# Patient Record
Sex: Female | Born: 1935 | Race: Black or African American | Hispanic: No | Smoking: Former smoker
Health system: Southern US, Community
[De-identification: ages and names within clinical notes are randomized; demographics above are authoritative.]

## PROBLEM LIST (undated history)

## (undated) DIAGNOSIS — J449 Chronic obstructive pulmonary disease, unspecified: Secondary | ICD-10-CM

## (undated) DIAGNOSIS — N184 Chronic kidney disease, stage 4 (severe): Secondary | ICD-10-CM

## (undated) DIAGNOSIS — J189 Pneumonia, unspecified organism: Secondary | ICD-10-CM

## (undated) DIAGNOSIS — I5032 Chronic diastolic (congestive) heart failure: Secondary | ICD-10-CM

## (undated) DIAGNOSIS — E0590 Thyrotoxicosis, unspecified without thyrotoxic crisis or storm: Secondary | ICD-10-CM

## (undated) DIAGNOSIS — E079 Disorder of thyroid, unspecified: Secondary | ICD-10-CM

## (undated) DIAGNOSIS — M109 Gout, unspecified: Secondary | ICD-10-CM

## (undated) DIAGNOSIS — M1990 Unspecified osteoarthritis, unspecified site: Secondary | ICD-10-CM

## (undated) DIAGNOSIS — Z9981 Dependence on supplemental oxygen: Secondary | ICD-10-CM

## (undated) DIAGNOSIS — I10 Essential (primary) hypertension: Secondary | ICD-10-CM

## (undated) DIAGNOSIS — I82409 Acute embolism and thrombosis of unspecified deep veins of unspecified lower extremity: Secondary | ICD-10-CM

## (undated) DIAGNOSIS — K219 Gastro-esophageal reflux disease without esophagitis: Secondary | ICD-10-CM

## (undated) HISTORY — DX: Chronic kidney disease, stage 4 (severe): N18.4

## (undated) HISTORY — PX: JOINT REPLACEMENT: SHX530

## (undated) HISTORY — DX: Chronic diastolic (congestive) heart failure: I50.32

## (undated) HISTORY — PX: CATARACT EXTRACTION W/ INTRAOCULAR LENS  IMPLANT, BILATERAL: SHX1307

## (undated) HISTORY — PX: VAGINAL HYSTERECTOMY: SUR661

---

## 1997-09-24 ENCOUNTER — Other Ambulatory Visit: Admission: RE | Admit: 1997-09-24 | Discharge: 1997-09-24 | Payer: Self-pay | Admitting: Orthopedic Surgery

## 2001-08-10 ENCOUNTER — Emergency Department (HOSPITAL_COMMUNITY): Admission: EM | Admit: 2001-08-10 | Discharge: 2001-08-10 | Payer: Self-pay | Admitting: Emergency Medicine

## 2004-06-11 HISTORY — PX: TOTAL KNEE ARTHROPLASTY: SHX125

## 2004-07-06 ENCOUNTER — Ambulatory Visit (HOSPITAL_COMMUNITY): Admission: RE | Admit: 2004-07-06 | Discharge: 2004-07-09 | Payer: Self-pay | Admitting: Orthopedic Surgery

## 2004-11-30 ENCOUNTER — Inpatient Hospital Stay (HOSPITAL_COMMUNITY): Admission: AD | Admit: 2004-11-30 | Discharge: 2004-12-08 | Payer: Self-pay | Admitting: Internal Medicine

## 2004-11-30 ENCOUNTER — Ambulatory Visit (HOSPITAL_COMMUNITY): Admission: RE | Admit: 2004-11-30 | Discharge: 2004-11-30 | Payer: Self-pay | Admitting: Internal Medicine

## 2004-12-02 ENCOUNTER — Ambulatory Visit: Payer: Self-pay | Admitting: Cardiology

## 2004-12-02 ENCOUNTER — Encounter: Payer: Self-pay | Admitting: Cardiology

## 2006-10-18 ENCOUNTER — Ambulatory Visit (HOSPITAL_COMMUNITY): Admission: RE | Admit: 2006-10-18 | Discharge: 2006-10-18 | Payer: Self-pay | Admitting: Internal Medicine

## 2007-07-26 ENCOUNTER — Emergency Department (HOSPITAL_COMMUNITY): Admission: EM | Admit: 2007-07-26 | Discharge: 2007-07-27 | Payer: Self-pay | Admitting: Emergency Medicine

## 2008-06-16 ENCOUNTER — Inpatient Hospital Stay (HOSPITAL_COMMUNITY): Admission: EM | Admit: 2008-06-16 | Discharge: 2008-06-22 | Payer: Self-pay | Admitting: Emergency Medicine

## 2008-06-29 ENCOUNTER — Emergency Department (HOSPITAL_COMMUNITY): Admission: EM | Admit: 2008-06-29 | Discharge: 2008-06-30 | Payer: Self-pay | Admitting: Emergency Medicine

## 2008-07-12 ENCOUNTER — Emergency Department (HOSPITAL_COMMUNITY): Admission: EM | Admit: 2008-07-12 | Discharge: 2008-07-12 | Payer: Self-pay | Admitting: Emergency Medicine

## 2008-08-28 ENCOUNTER — Ambulatory Visit (HOSPITAL_COMMUNITY): Admission: RE | Admit: 2008-08-28 | Discharge: 2008-08-28 | Payer: Self-pay | Admitting: Internal Medicine

## 2010-01-31 ENCOUNTER — Encounter: Payer: Self-pay | Admitting: Internal Medicine

## 2010-04-19 LAB — CBC
HCT: 29.8 % — ABNORMAL LOW (ref 36.0–46.0)
Hemoglobin: 10.1 g/dL — ABNORMAL LOW (ref 12.0–15.0)
MCHC: 33.7 g/dL (ref 30.0–36.0)
MCV: 98.1 fL (ref 78.0–100.0)
Platelets: 137 10*3/uL — ABNORMAL LOW (ref 150–400)
RBC: 3.04 MIL/uL — ABNORMAL LOW (ref 3.87–5.11)
RDW: 15.9 % — ABNORMAL HIGH (ref 11.5–15.5)
WBC: 7.4 10*3/uL (ref 4.0–10.5)

## 2010-04-19 LAB — LIPASE, BLOOD: Lipase: 21 U/L (ref 11–59)

## 2010-04-19 LAB — COMPREHENSIVE METABOLIC PANEL
ALT: 67 U/L — ABNORMAL HIGH (ref 0–35)
AST: 182 U/L — ABNORMAL HIGH (ref 0–37)
Albumin: 3.4 g/dL — ABNORMAL LOW (ref 3.5–5.2)
Alkaline Phosphatase: 144 U/L — ABNORMAL HIGH (ref 39–117)
BUN: 25 mg/dL — ABNORMAL HIGH (ref 6–23)
CO2: 22 mEq/L (ref 19–32)
Calcium: 9.9 mg/dL (ref 8.4–10.5)
Chloride: 106 mEq/L (ref 96–112)
Creatinine, Ser: 1.81 mg/dL — ABNORMAL HIGH (ref 0.4–1.2)
GFR calc Af Amer: 33 mL/min — ABNORMAL LOW (ref >60)
GFR calc non Af Amer: 27 mL/min — ABNORMAL LOW (ref >60)
Glucose, Bld: 106 mg/dL — ABNORMAL HIGH (ref 70–99)
Potassium: 4.5 mEq/L (ref 3.5–5.1)
Sodium: 135 mEq/L (ref 135–145)
Total Bilirubin: 0.6 mg/dL (ref 0.3–1.2)
Total Protein: 6.9 g/dL (ref 6.0–8.3)

## 2010-04-19 LAB — DIFFERENTIAL
Basophils Absolute: 0 10*3/uL (ref 0.0–0.1)
Basophils Relative: 0 % (ref 0–1)
Eosinophils Absolute: 0.1 10*3/uL (ref 0.0–0.7)
Eosinophils Relative: 2 % (ref 0–5)
Lymphocytes Relative: 27 % (ref 12–46)
Lymphs Abs: 2 10*3/uL (ref 0.7–4.0)
Monocytes Absolute: 0.5 10*3/uL (ref 0.1–1.0)
Monocytes Relative: 6 % (ref 3–12)
Neutro Abs: 4.8 10*3/uL (ref 1.7–7.7)
Neutrophils Relative %: 65 % (ref 43–77)

## 2010-04-19 LAB — POCT CARDIAC MARKERS
CKMB, poc: <1 ng/mL — ABNORMAL LOW (ref 1.0–8.0)
CKMB, poc: <1 ng/mL — ABNORMAL LOW (ref 1.0–8.0)
Myoglobin, poc: 84.1 ng/mL (ref 12–200)
Myoglobin, poc: 88.6 ng/mL (ref 12–200)
Troponin i, poc: <0.05 ng/mL (ref 0.00–0.09)
Troponin i, poc: <0.05 ng/mL (ref 0.00–0.09)

## 2010-04-19 LAB — URINALYSIS, ROUTINE W REFLEX MICROSCOPIC
Bilirubin Urine: NEGATIVE
Glucose, UA: NEGATIVE mg/dL
Ketones, ur: NEGATIVE mg/dL
Leukocytes, UA: NEGATIVE
Nitrite: NEGATIVE
Protein, ur: NEGATIVE mg/dL
Specific Gravity, Urine: 1.017 (ref 1.005–1.030)
Urobilinogen, UA: 0.2 mg/dL (ref 0.0–1.0)
pH: 5.5 (ref 5.0–8.0)

## 2010-04-19 LAB — URINE MICROSCOPIC-ADD ON

## 2010-04-20 LAB — COMPREHENSIVE METABOLIC PANEL
ALT: 37 U/L — ABNORMAL HIGH (ref 0–35)
ALT: <8 U/L (ref 0–35)
ALT: <8 U/L (ref 0–35)
ALT: <8 U/L (ref 0–35)
AST: 14 U/L (ref 0–37)
Albumin: 3.1 g/dL — ABNORMAL LOW (ref 3.5–5.2)
Albumin: 2.8 g/dL — ABNORMAL LOW (ref 3.5–5.2)
Albumin: 3.5 g/dL (ref 3.5–5.2)
Alkaline Phosphatase: 76 U/L (ref 39–117)
Alkaline Phosphatase: 66 U/L (ref 39–117)
BUN: 37 mg/dL — ABNORMAL HIGH (ref 6–23)
CO2: 22 mEq/L (ref 19–32)
Calcium: 10.2 mg/dL (ref 8.4–10.5)
Calcium: 9.7 mg/dL (ref 8.4–10.5)
Chloride: 113 mEq/L — ABNORMAL HIGH (ref 96–112)
Chloride: 112 mEq/L (ref 96–112)
Creatinine, Ser: 1.93 mg/dL — ABNORMAL HIGH (ref 0.4–1.2)
Creatinine, Ser: 2.54 mg/dL — ABNORMAL HIGH (ref 0.4–1.2)
GFR calc Af Amer: 22 mL/min — ABNORMAL LOW (ref >60)
GFR calc Af Amer: 27 mL/min — ABNORMAL LOW (ref >60)
GFR calc non Af Amer: 26 mL/min — ABNORMAL LOW (ref >60)
Glucose, Bld: 109 mg/dL — ABNORMAL HIGH (ref 70–99)
Glucose, Bld: 107 mg/dL — ABNORMAL HIGH (ref 70–99)
Potassium: 4.9 mEq/L (ref 3.5–5.1)
Potassium: 3.4 mEq/L — ABNORMAL LOW (ref 3.5–5.1)
Sodium: 139 mEq/L (ref 135–145)
Sodium: 140 mEq/L (ref 135–145)
Sodium: 141 mEq/L (ref 135–145)
Sodium: 141 mEq/L (ref 135–145)
Total Bilirubin: 0.4 mg/dL (ref 0.3–1.2)
Total Bilirubin: 0.7 mg/dL (ref 0.3–1.2)
Total Protein: 6.7 g/dL (ref 6.0–8.3)
Total Protein: 6.1 g/dL (ref 6.0–8.3)

## 2010-04-20 LAB — CULTURE, RESPIRATORY W GRAM STAIN: Culture: NORMAL

## 2010-04-20 LAB — POCT CARDIAC MARKERS
CKMB, poc: <1 ng/mL — ABNORMAL LOW (ref 1.0–8.0)
Myoglobin, poc: 68.6 ng/mL (ref 12–200)
Troponin i, poc: <0.05 ng/mL (ref 0.00–0.09)

## 2010-04-20 LAB — EXPECTORATED SPUTUM ASSESSMENT W GRAM STAIN, RFLX TO RESP C

## 2010-04-20 LAB — URIC ACID: Uric Acid, Serum: 9.2 mg/dL — ABNORMAL HIGH (ref 2.4–7.0)

## 2010-04-20 LAB — DIFFERENTIAL
Basophils Absolute: 0 10*3/uL (ref 0.0–0.1)
Basophils Relative: 0 % (ref 0–1)
Eosinophils Absolute: 0 10*3/uL (ref 0.0–0.7)
Eosinophils Relative: 0 % (ref 0–5)
Lymphocytes Relative: 21 % (ref 12–46)
Lymphs Abs: 2 10*3/uL (ref 0.7–4.0)
Monocytes Absolute: 0.5 10*3/uL (ref 0.1–1.0)
Monocytes Absolute: 0.5 10*3/uL (ref 0.1–1.0)
Monocytes Relative: 5 % (ref 3–12)

## 2010-04-20 LAB — CULTURE, BLOOD (ROUTINE X 2): Culture: NO GROWTH

## 2010-04-20 LAB — HEMOCCULT GUIAC POC 1CARD (OFFICE): Fecal Occult Bld: POSITIVE

## 2010-04-20 LAB — CBC
HCT: 28.3 % — ABNORMAL LOW (ref 36.0–46.0)
HCT: 27.7 % — ABNORMAL LOW (ref 36.0–46.0)
Hemoglobin: 9.5 g/dL — ABNORMAL LOW (ref 12.0–15.0)
MCV: 95.6 fL (ref 78.0–100.0)
Platelets: 158 10*3/uL (ref 150–400)
Platelets: 106 10*3/uL — ABNORMAL LOW (ref 150–400)
Platelets: 136 10*3/uL — ABNORMAL LOW (ref 150–400)
Platelets: 143 10*3/uL — ABNORMAL LOW (ref 150–400)
RDW: 14.8 % (ref 11.5–15.5)
RDW: 13.8 % (ref 11.5–15.5)
WBC: 10.5 10*3/uL (ref 4.0–10.5)
WBC: 6.6 10*3/uL (ref 4.0–10.5)
WBC: 9.6 10*3/uL (ref 4.0–10.5)

## 2010-04-20 LAB — URINALYSIS, ROUTINE W REFLEX MICROSCOPIC
Ketones, ur: NEGATIVE mg/dL
Leukocytes, UA: NEGATIVE
Nitrite: NEGATIVE
Protein, ur: 30 mg/dL — AB
Urobilinogen, UA: 1 mg/dL (ref 0.0–1.0)

## 2010-04-20 LAB — URINE MICROSCOPIC-ADD ON

## 2010-04-20 LAB — T3
T3, Total: 70 ng/dl — ABNORMAL LOW (ref 80.0–204.0)
T3, Total: 83.8 ng/dl (ref 80.0–204.0)

## 2010-04-20 LAB — URINE CULTURE: Colony Count: 15000

## 2010-04-20 LAB — TSH: TSH: 0.023 u[IU]/mL — ABNORMAL LOW (ref 0.350–4.500)

## 2010-04-20 LAB — VITAMIN B12: Vitamin B-12: 376 pg/mL (ref 211–911)

## 2010-04-20 LAB — IRON AND TIBC
Iron: 19 ug/dL — ABNORMAL LOW (ref 42–135)
Saturation Ratios: 12 % — ABNORMAL LOW (ref 20–55)
TIBC: 162 ug/dL — ABNORMAL LOW (ref 250–470)
UIBC: 143 ug/dL

## 2010-04-20 LAB — SEDIMENTATION RATE: Sed Rate: 81 mm/hr — ABNORMAL HIGH (ref 0–22)

## 2010-04-20 LAB — BRAIN NATRIURETIC PEPTIDE: Pro B Natriuretic peptide (BNP): 354 pg/mL — ABNORMAL HIGH (ref 0.0–100.0)

## 2010-05-29 NOTE — Discharge Summary (Signed)
[name], Alexander                ACCOUNT NO.:  [id number]   MEDICAL RECORD NO.:  [id number]          PATIENT TYPE:  INP   LOCATION:  5507                         FACILITY:  [facility]   PHYSICIAN:  Margaretmary Bayley, M.D.    DATE OF BIRTH:  1933-02-02   DATE OF ADMISSION:  11/30/2004  DATE OF DISCHARGE:  12/08/2004                                 DISCHARGE SUMMARY   DISCHARGE DIAGNOSES:  1.  Venous thrombosis of the lower extremity.  2.  Atrial fibrillation.  3.  History of total knee replacement.  4.  Chronic cigarette abuse.  5.  Systemic hypertension.  6.  Anemia related to chronic gastrointestinal blood loss and chronic      disease.   REASON FOR ADMISSION:  This is one of several [place] hospitalizations  for [name], a 77 year old African-American woman who is admitted with a  three to four-week history of progressive swelling and pain of the left leg  and an outpatient Doppler study which revealed changes consistent with DVT  of the leg.   PHYSICAL EXAMINATION:  GENERAL:  She is a well-developed, well-nourished  woman looking about her age of 77.  VITAL SIGNS:  Temperature 99.2 with a pulse rate of 96, respiratory rate of  24, blood pressure 162/84.  HEENT:  She has a mild conjunctival pallor.  Sclera is anicteric.  Pharynx  is without any exudates.  No coating of the tongue.  CHEST:  She has hyperresonance and decrease in her breath sounds distally.  No gross wheezing is noted at this time.  She does have an increase in AP  diameter and a PMI that is in the subxiphoid area.  CARDIAC:  She has no murmurs, rubs, or gallops.  ABDOMEN:  Nondistended, soft without any tenderness.  She has an old  surgical scar that is well healed.  Bowel sounds are slightly increased.  EXTREMITIES:  There is 2-3+ edema extending from the ankles to the  suprapatellar area with calf tenderness and a mildly positive Homans'.  There is no skin breakdown in that area.  Her distal pulses:  She has  a  dorsalis pedis that is trace to 1+ bilaterally, but no ischemic changes.  She does have some onychomycotic changes bilaterally.   PERTINENT LABORATORY AND X-RAY DATA:  Her arterial blood gas on room air:  pH is 7.43 with a pO2 of 82, pCO2 of 37.  Her CBC:  White count is 10,900  with hematocrit of 32 and a hemoglobin of 11.  Sedimentation rate is 11.  Her D-dimer is elevated at 17.93.  INR is 1 and PTT is 27.  Her CMET is  unremarkable with the exception of an elevation in her glucose randomly at  103, BUN 26, creatinine 1.4.  A T4 is normal to date with a T3 as an uptake  likewise normal at 33.4.  Her TSH is mildly low at 0.265.  A repeat two days  later revealed a normal TSH at 0.812 and her free T3 was normal at 2.5.  Her  admission urinalysis had few bacteria, many epithelial  cells, white cells 3-  6, and no red cells.  Specific gravity 1017.  Her iron over total iron  binding capacity is 50/248 with a saturation of 20% and a ferritin is  elevated at 795.  A repeat urinalysis on the seventh hospital day revealed  many bacteria.  A subsequent culture grew out 80,000 colonies of E. coli.   HOSPITAL COURSE:  The patient was admitted and on her telemetry monitor was  noted to have transient episodes of atrial fibrillation, but spontaneously  converted back into a normal sinus rhythm.  A spiral CT scan and abdominal  CT scan were not done because of patient's creatinine of 1.4.  However, it  should be noted that her arterial blood gases on room air were more than  adequate and not consistent with pulmonary embolization.  She did have a 2-D  echocardiogram performed which showed no significant valvular heart disease.  She was subsequently started on heparin and subsequently changed to Lovenox  initially while being coumadinized.  Although the patient did have evidence  of an anemia, she had no evidence of significant GI bleeding.  It must be  noted that she does have or had a history of  a past gastric ulcer and stools  for occult blood will be continued to monitor her for the possibility of  occult GI bleeding.  Patient had no complications with her anticoagulation.  Her activity was gradually advanced.  A subsequent nuclear medicine  perfusion lung scan was performed and was found to be of low probability for  pulmonary embolization.  She did have changes consistent with known chronic  obstructive pulmonary disease.  Patient's hospitalization was otherwise  unremarkable and she is subsequently discharged home on Coumadin 5 mg p.o.  daily along with calcium and vitamin D, Nu-Iron V one b.i.d. with meals,  Senokot at bedtime, Zantac 150 mg daily, and the Nicoderm patch which will  be tapered in an attempt to assist her in discontinuing her cigarette  smoking.  She is scheduled to be seen back in my office; however, she will  come in in one week for a repeat INR and in two days the home health nurse  will draw one.  She is discharged home on a 3 g sodium diet.  She is to  avoid cigarette smoking.           ______________________________  Margaretmary Bayley, M.D.     PC/MEDQ  D:  02/11/2005  T:  02/11/2005  Job:  [id number]

## 2010-05-29 NOTE — Discharge Summary (Signed)
[name] Alexander, [name] Alexander                ACCOUNT NO.:  [id number]   MEDICAL RECORD NO.:  [id number]          PATIENT TYPE:  INP   LOCATION:  6706                         FACILITY:  [facility]   PHYSICIAN:  Margaretmary Bayley, M.D.    DATE OF BIRTH:  10/19/1933   DATE OF ADMISSION:  06/15/2008  DATE OF DISCHARGE:  06/22/2008                               DISCHARGE SUMMARY   DISCHARGE DIAGNOSES:  1. Bilateral pneumonia, community acquired.  2. Gastroesophageal reflux.  3. Chronic cigarette abuse.  4. Dehydration.  5. Chronic obstructive pulmonary disease.  6. History of bronchiectasis.  7. Gouty arthropathy.  8. Systemic hypertension.  9. Chronic kidney disease.   This is one of the several [place] hospitalizations for Ms. Sowash, a  77 year old African American woman with a history of more than 40-pack  years of cigarette smoking, chronic obstructive pulmonary disease, and  previous pneumonia, who is brought in with increasing cough, shortness  of breath, and anterior chest pain made worse with cough.  She denied  any fever or chills.  No hemoptysis.   PERTINENT PHYSICAL FINDINGS:  GENERAL:  She is a well-developed elderly  woman.  VITAL SIGNS:  Temperature was 100.2 with a pulse rate of 84, respiratory  rate of 22-24, blood pressure of 96/58 with O2 sat of 97%.  HEENT:  No conjunctival pallor or scleral icterus.  NECK:  Supple.  No adenopathy or thyromegaly.  CHEST:  She had fine rales at the bases with diminution of breath  sounds.  No discernible wheezes and no consolidation.  BREASTS:  No masses, tenderness, or discharges.  CARDIAC:  She had a regular rhythm.  No gallops or rubs.  ABDOMEN:  Soft and nondistended without any focal tenderness.  EXTREMITIES:  No clubbing, cyanosis, or edema.  Pulses are trace to 1+  bilaterally.  BACK:  There is no punch spinal or CVA tenderness.  NEUROLOGIC:  She is alert, oriented, and cooperative.  No focal sensory,  motor, or reflex deficits.   LABORATORY AND X-RAY DATA:  Chest x-ray revealed bilateral basilar  infiltrates.  Hematocrit of 33 with a white count of 9600.  Stool  positive for occult blood.  Sed rate elevated to 81.  Comprehensive  metabolic panel; creatinine on the patient is 2.5, BUN of 40, and  glucose of 119.  Sodium, potassium, chloride and CO2 were normal.  Total  protein, albumin, AST, and ALT were all normal, and she had an elevation  in her uric acid to 9.2.  BNP of 354.  TSH is low at 0.18 and 0.023 on 2  separate determinations.  Urinalysis is normal with the exception of  small hemoglobin, protein is 30 mg percent, white cells of 0-2, red  blood cells 3-6, rare bacterium, she did have granular cast.  Blood  cultures x2 revealed no growth.  Urine 15,000 colonies of multiple  bacterial types and sputum revealed normal oropharyngeal flora.   HOSPITAL COURSE:  The patient is admitted with a working diagnosis of  bilateral lower lobe pneumonia with evidence of underlying chronic  obstructive pulmonary disease and a history  of bronchiectasis.  The  patient was cultured, both blood, sputum, urine, and started on Rocephin  and Zithromax 1 g IV daily and 500 mg IV daily respectively.  She  received albuterol nebulizers for sputum mobilization and  bronchodilatation.  The patient was also started on Protonix.  With this  regimen, the patient did quite well subjectively feeling much improved  with very little cough.  She was likewise started on prednisone, a  tapering course to treat her underlying gouty arthropathy and at the  same time providing adequate therapy for bronchitic phase of her  pneumonia.  The patient's hospital course was also remarkable for lower  TSH levels on 2 occasions.  She was scheduled for a thyroid scan and  uptake, which was not done at the time of her discharge and was  scheduled to be done as an outpatient.  Other problems to be evaluated  as an outpatient included stool that were  positive for blood and iron  replacement, most likely related to her occult GI bleeding.  The patient  is scheduled for an outpatient GI Clinic evaluation for colonoscopy and  endoscopy with [facility].  She is scheduled to have a repeat chest x-ray  in 2 weeks.   OTHER MEDICATIONS ON DISCHARGE:  1. Losartan 100 mg per day.  2. Complete her prednisone taper at 10 mg daily for 1 week and then 10      mg every other day for an additional week.  3. Ferrous gluconate 324 mg daily.  4. Allopurinol 100 mg daily.  5. Zithromax 500 mg daily for 4 days.  6. She is instructed not to use any aspirin or nonsteroidal anti-      inflammatory agents.   DISCHARGE CONDITION:  Significantly improved.   She is scheduled to be seen in followup in my office in 2 weeks.   DISCHARGE DIET:  3-g sodium, modified fat restricted.      Margaretmary Bayley, M.D.  Electronically Signed     PC/MEDQ  D:  08/03/2008  T:  08/03/2008  Job:  [id number]

## 2010-05-29 NOTE — Op Note (Signed)
NAMEZAKIRA, RESSEL                ACCOUNT NO.:  [id number]   MEDICAL RECORD NO.:  [id number]          PATIENT TYPE:  INP   LOCATION:  2550                         FACILITY:  [facility]   PHYSICIAN:  Mila Homer. Sherlean Foot, M.D. DATE OF BIRTH:  10/09/1933   DATE OF PROCEDURE:  DATE OF DISCHARGE:                                 OPERATIVE REPORT   SURGEON:  Mila Homer. Sherlean Foot, M.D.   ASSISTANT:  Richardean Canal, P.A.   ANESTHESIA:  General.   PREOPERATIVE DIAGNOSIS:  Right knee osteoarthritis.   POSTOPERATIVE DIAGNOSIS:  Right knee osteoarthritis.   PROCEDURES:  Right total knee arthroplasty.   INDICATIONS FOR PROCEDURE:  The patient is a 77 year old black female with  failure of conservative measures for osteoarthritis of the right knee.  Informed consent was obtained.   DESCRIPTION OF PROCEDURE:  The patient was laid supine and administered  general anesthesia.  Foley catheter placement.  Then the right lower  extremity was prepped and draped in the usual sterile fashion.  The  extremity was exsanguinated, the tourniquet inflated to 350 mmHg and set for  an hour.  A #10 blade was used to make a midline incision 10 cm in length.  A fresh blade was used to make a median parapatellar arthrotomy and perform  a synovectomy.  I then elevated the deep MCL off of the medial crest of the  tibia around the semimembranous tendon and did perform somewhat of a medial  release because this was a significant varus knee with both extension and  varus contractures.  I then everted the patella and measured it at only 14  mm thick.  I reamed down to 12 mm and got a good surface of bone.  At this  point, I elected to go ahead and put in a 32 mm patella after drilling the  lug holes with the template in place.  It measured 20 mm thick.  I then  everted the patella.  It went into flexion.  I put the scoop retractor  behind the femur and made an intramedullary drill hole in the femur.  I  placed the  intermedullary guide set on 0.63 valgus cut.  I then moved the  distal femoral cutting block to take an additional 2 mm of bone off of the  distal femur to accommodate for the flexion contracture of greater than 10  degrees.  I made that cut with a sagittal saw and removed the distal femoral  cutting block.  I then sized to a size E pin through the 3 degree external  rotation holes.  I then screwed in the size E cutting block and made the  anterior, posterior and chamfer cuts with a sagittal saw.  I removed the  excess pieces of bone and filled the intermedullary canal with a bone plug  from these pieces.  I then put in the lamina spreader and removed the medial  and lateral menisci, posterior condylar osteophytes and ACL and PCL.  I then  put a 10 mm spacer block in.  I had good alignment.  The tibial cut was  excellent.  The flexion and extension gap balance was also good.  I then  finished the femur with a size E finishing block drill and saw.  I finished  the tibia with a size 4 tibial tray drill and keel.  I then trialed a size 4  tibial size E femur and size 32 patella.  Had good flexion and extension gap  balance and good alignment.  Patella tracking was perfect.  I then removed  the trial components, copiously irrigated and cemented in the size 4 tibia  first.  I removed excess cement and cemented in a size E femur second.  I  removed excess cement, cemented in a size 32 patella, removed excess cement  and then snapped in the polyethylene and let this harden in extension.  I  put a Hemovac in coming out superolaterally and deep to the arthrotomy.  I  closed the arthrotomy with interrupted 1-0 Vicryl sutures.  I let the  tourniquet down and obtained hemostasis.  I then closed the subcuticular  layer with running 2-0 Vicryl stitch and skin staples.  Dressing of Adaptic,  4 x 4s, ABD, one layer of Webril and TED stocking.   COMPLICATIONS:  None.   DRAINS:  One Hemovac.   ESTIMATED  BLOOD LOSS:  300 mL.       SDL/MEDQ  D:  07/06/2004  T:  07/06/2004  Job:  [id number]

## 2010-05-29 NOTE — H&P (Signed)
[name], [name] Alexander                ACCOUNT NO.:  [id number]   MEDICAL RECORD NO.:  [id number]          PATIENT TYPE:  INP   LOCATION:  NA                           FACILITY:  [facility]   PHYSICIAN:  [name] Alexander, M.D. DATE OF BIRTH:  [birth year]   DATE OF ADMISSION:  07/06/2004  DATE OF DISCHARGE:                                HISTORY & PHYSICAL   CHIEF COMPLAINT:  Right knee pain for the last 10 years.   HISTORY OF PRESENT ILLNESS:  This 77 year old, black, female patient  presented to Dr. Sherlean Alexander with of approximately a 10-year history of gradual  onset progressive right knee pain. She has had no known injury or prior  surgery to her knee, but the pain is getting worse. It is described now as a  constant throbbing sensation diffuse about the knee with radiation at times  all the way down to her Alexander. The pain increases with any walking and then  decreases if she sits and rests. She is currently taking Mobic for pain and  that provides some relief. She does complain of the knee popping and  swelling at times. There is no catching, grinding, locking or giving way.  She has not received cortisone injections or viscosupplementation. She has  been ambulating with the use of a cane for the last five years.   ALLERGIES:  No known drug allergies.   CURRENT MEDICATIONS:  1.  Diovan/HCT 160/12.5 mg one tablet p.o. q.a.m.  2.  Mobic 15 mg one tablet p.o. q.p.m.  3.  Augmentin XR 1000 mg one tablet p.o. q.a.m. for a cold. She has been on      this for about two weeks.   PAST MEDICAL HISTORY:  1.  Hypertension.  2.  History of DVT of right leg 10 years ago with no causative events.   She denies any history of diabetes mellitus, thyroid disease, hiatal hernia,  reflux, peptic ulcer disease, heart disease or any other chronic medical  condition.   PAST SURGICAL HISTORY:  Hysterectomy 1980.   She denies any complications from the above-mentioned procedure.   SOCIAL HISTORY:  She has a  40-45-pack-year history of cigarette smoking and  she still currently smokes a half to one pack of cigarettes a day. She does  not drink any alcohol nor use any drugs. She is a widow and lives by herself  in a one-story house with three steps into the main entrance. She does have  two sons and one daughter, who passed away. She is retired from [employer], [employer]. Her medical doctor is Dr. Margaretmary Alexander at [phone].   FAMILY HISTORY:  Mother died at the age of 98 with a brain aneurysm and  hypertension. Father died at the age of 56 with hypertension. She has two  brothers who passed away, one at age 41 with congestive heart failure and  one at age 66 with cancer. Her living sons are ages 84 and 28 and the 47  year old has esophageal cancer. She did have a 64 year old daughter who died  with hypertension and kidney failure.  REVIEW OF SYSTEMS:  She does wear partial dentures on the upper jaw line.  She wears glasses. She has a history of a DVT in the right leg 10 years ago  which was treated with Coumadin. She does have cataracts in both eyes. She  has arthritic symptoms in the left shoulder and left knee. She did have a  cold with a cough about two weeks ago and that is what she was given the  Augmentin for. She has had some mild GI upset for about two weeks since she  is now on the Augmentin. She does not have a living will nor a power of  attorney. All other systems are negative and noncontributory.   PHYSICAL EXAMINATION:  GENERAL APPEARANCE:  A well-developed, well-  nourished, thin, black female who walks with an antalgic gait and the use of  a cane. Mood and affect are appropriate. Accompanied by her son.  HEIGHT:  5 feet 4 inches.  WEIGHT:  156 pounds.  BODY MASS INDEX:  26.  VITAL SIGNS: Temperature 97.2 degrees Fahrenheit, pulse 68, respirations 22  and BP 156/74.  HEENT: Normocephalic and atraumatic without frontal or maxillary sinus  tenderness to palpation. Conjunctivae  pink. Sclerae anicteric. PERRLA. EOMs  intact. No visible external ear deformities. Hearing grossly intact.  Tympanic membranes pearly gray with good light reflex. Nose and nasal septum  midline. Nasal mucosa pink and moist without exudates or polyps noted.  Buccal mucosa pink and moist. Dentition in fair repair. Partial plate out at  this time. Pharynx without erythema or exudates. Tongue and uvula midline.  Tongue without fasciculations. Uvula rises equally with phonation.  NECK: No visible masses or lesions noted. Trachea midline. No palpable  lymphadenopathy nor thyromegaly. Carotids +2 bilaterally without bruits.  Full range of motion and nontender to palpation along the cervical spine.  CARDIOVASCULAR: Heart rate and rhythm regular. S1 and S2 present without  rubs, clicks or murmurs noted.  RESPIRATORY: Respirations even and unlabored. Breath sounds clear to  auscultation bilaterally without rales or wheezes noted.  ABDOMEN:  Rounded abdominal contour. Bowel sounds present x 4 quadrants.  Soft and nontender to palpation without hepatosplenomegaly nor CVA  tenderness. Femoral pulses +2 bilaterally.  BACK:  Nontender to palpation along the vertebral column.  BREASTS/RECTAL/PELVIC: These exams deferred at this time.  MUSCULOSKELETAL: No obvious deformities of bilateral upper extremities. Full  range of motion of these extremities without pain. Radial pulses +2  bilaterally. She has full range of motion of her hips, ankles and toes  bilaterally. DP and PT pulses are +2. No lower extremity edema. Negative  Homans' signs bilaterally. No calf pain with palpation bilaterally. DP and  PT pulses are +2.  The left knee appears to be about 15 degrees valgus position at rest. She is  lacking about 10-15 degrees of full extension, but can flex the knee to 130  degrees with a moderate amount of patellofemoral crepitus. No pain with palpation along the joint line. No effusion. Stable to varus and  valgus  stress. Negative anterior drawer. The right knee is lacking about 20 degrees  to full extension and can flex the knee to about 110 degrees with a coarse  amount of crepitus. At rest, it appears to be resting in about 10 degrees  valgus position. No pain with palpation along the joint line. Stable to  varus and valgus stress. Negative anterior drawer.  NEUROLOGIC: Alert and oriented x 3. Cranial nerves II-XII are grossly  intact. Strength  5/5 in bilateral upper and lower extremities. Rapid  alternating movements intact. Deep tendon reflexes 2+ in bilateral upper and  lower extremities.   RADIOLOGIC FINDINGS:  X-rays taken of her right knee in May of 2006 show  severe medial compartment collapse and 10 degrees of varus deformity on x-  ray in that right knee.   IMPRESSION:  1.  End-stage osteoarthritis of right knee with varus deformity on x-ray.  2.  Hypertension.  3.  Chronic obstructive pulmonary disease with chronic tobacco abuse.  4.  History of deep venous thrombosis of right leg 10 years ago.  5.  Bilateral eye cataracts.   PLAN:  Ms. Shad will be admitted to [facility] on July 06, 2004,  where she will undergo a right total knee arthroplasty by Dr. Mila Homer.  [place]. She will undergo all of the routine preoperative laboratory tests and  studies prior to this procedure. If she has any medical issues while she is  hospitalized, we will consult Dr. Chestine Spore.      Sherrilyn Rist   KED/MEDQ  D:  07/01/2004  T:  07/01/2004  Job:  [id number]

## 2010-05-29 NOTE — Discharge Summary (Signed)
[name], [name] Alexander                ACCOUNT NO.:  [id number]   MEDICAL RECORD NO.:  [id number]          PATIENT TYPE:  INP   LOCATION:  5008                         FACILITY:  [facility]   PHYSICIAN:  Mila Homer. Sherlean Foot, M.D. DATE OF BIRTH:  1933-06-12   DATE OF ADMISSION:  07/06/2004  DATE OF DISCHARGE:  07/09/2004                                 DISCHARGE SUMMARY   ADMITTING DIAGNOSES:  1. End-stage osteoarthritis right knee.  2. History of hypertension.  3. History of deep venous thrombosis right leg.   DISCHARGE DIAGNOSES:  1. Right total knee arthroplasty.  2. Postoperative blood loss anemia, improved with 2 units of packed red      blood cells.  3. Postoperative acute renal failure due to hypovolemia, resolved with      transfusion and hydration.  4. Leukocytosis expected to be reactionary to blood transfusion, improving      with no signs or symptoms or any other complaints.  5. History of hypertension.  6. History of deep venous thrombosis.   HISTORY OF PRESENT ILLNESS:  Patient is a 77 year old black female with 10-  year history of progressively worsening right knee pain.  She denies any  previous injury or surgery to that knee.  The pain is a constant throbbing,  diffuse pain about the knee with radiation down to the foot.  It worsens  with ambulation.  She currently has some improvement with Mobic but she also  has a significant amount of popping, edema and she is ambulating with a cane  the last five years.   ALLERGIES:  No known drug allergies.   CURRENT MEDICATIONS:  1. Diovan/HCT 160/12.5 mg p.o. daily.  2. Mobic 15 mg p.o. q.p.m.  3. Augmentin XR 1000 mg p.o. daily for a recent cold.   SURGICAL PROCEDURE:  On July 06, 2004 patient was taken to the OR by Dr.  Georgena Spurling assisted by Rexene Edison, P.A.-C.  Under general anesthesia the  patient underwent a right total knee arthroplasty without any complications.  Patient had the following components implanted:  A  right size E femoral  component, a right size 4 tibial tray, a 10 mm bearing, a size 32 mm patella  component.  Components were implanted with polymethylmethacrylate.  The  patient was transferred to the recovery room and then to the orthopedic  floor in good condition for total knee protocol.   CONSULTS:  The following routine consults were requested:  Physical therapy,  occupational therapy, case management.   HOSPITAL COURSE:  On July 06, 2004 patient was admitted to [facility] under the care of Dr. Georgena Spurling.  Patient was taken to the OR  where right total knee arthroplasty was performed without any complications.  Patient tolerated the procedure well and was transferred to the recovery  room and to the orthopedic floor for routine total knee protocol.  Patient  was transferred on IV pain medications and antibiotics.  She also had her  right leg in a CPM and she was started on Lovenox for DVT protocol.   Patient then incurred a  total of three days postoperative care on the  orthopedic floor in which the patient did develop some postoperative blood  loss anemia on postoperative day #1 with her hemoglobin dropping to 8.3.  She also had an elevation in her BUN and creatinine so she was typed and  crossed and transfused 2 units of packed red blood cells and continued with  hydration.  Patient's hemoglobin improved to 11.2.  She had some slight bump  in her white count post transfusion and this was felt to be reactionary and  this did resolve over the next 24 hours to drop down to 12.7.  Patient's BUN  and creatinine slowly did improve to where on postoperative day #3 BUN was  21 and creatinine was 1.6.  This was near preoperative evaluation levels.   Patient's wound remained benign for any signs of infection.  Her leg  remained neuro/motor vascularly intact.  Calf was soft, nontender.  Patient  worked well with physical therapy and was ambulating at least 200 feet with   just supervision and a rolling walker.  It was felt on postoperative day #3  the patient was medically and orthopedically stable and ready for discharge  to home for outpatient home health physical therapy so arrangements were  made and she was discharged in good condition.   LABORATORIES:  CBC on June 29:  WBC 12.7 down from 16 the day previous  without any signs or symptoms or complaints or any temperature, hemoglobin  10.6, hematocrit 30.6.  Routine chemistries:  Sodium 137, potassium 3.6, BUN  21 down from a high of 28, creatinine 1.6 down from a high of 1.7.  Glucose  was 94.  Patient received a total of 2 units of packed red blood cells.  EKG  on admission was sinus rhythm with occasional premature ventricular complex,  otherwise normal EKG at 71 beats per minute.  Chest x-ray on admission  showed COPD, no active disease.   DISCHARGE MEDICATIONS:  1. Colace 100 mg p.o. b.i.d.  2. Lovenox 30 mg subcutaneous q.12h.  3. Avapro 300 mg p.o. daily.  4. Hydrochlorothiazide 12.5 mg p.o. daily.  5. Mobic 15 mg p.o. daily.  6. Augmentin 875 mg p.o. q.24h.  7. Nicotine patch daily.  8. Laxative or enema of choice p.r.n.  9. Percocet one or two tablets every four to six hours p.r.n.  10.Tylenol 650 mg p.o. q.4h. p.r.n.  11.Robaxin 500 mg p.o. q.6h.  12.Restoril 15 mg p.o. q.h.s. p.r.n.  13.Reglan 10 mg p.o. q.8h. p.r.n.   DISCHARGE INSTRUCTIONS:  1. Medications:  Patient is to resume routine home medications with the      addition of the following:  Lovenox 40 mg injection once a day for 10      days, Percocet 5 mg one or two tablets every four to six hours for pain      if needed.  2. Diet:  No restrictions.  3. Activity:  No restrictions.  May walk with assistance with the use of a      walker.  She may use the leg immobilizer while walking only until she      is able to straight leg raise easily. 4. Wound care:  Patient is to change dressing daily.  If any concerns or       problems patient is to call Dr. Tobin Chad office for advice.  She may      shower as of Friday.  5. Follow-up:  Patient needs a follow-up appointment  with Dr. Sherlean Foot on      July 11.  Patient is to call [phone] for a follow-up appointment.   CONDITION ON DISCHARGE:  Improved and good.      Robe   RWK/MEDQ  D:  07/09/2004  T:  07/09/2004  Job:  [id number]

## 2010-10-09 LAB — COMPREHENSIVE METABOLIC PANEL
ALT: 8
AST: 15
Calcium: 10
Creatinine, Ser: 1.67 — ABNORMAL HIGH
GFR calc Af Amer: 36 — ABNORMAL LOW
Sodium: 138
Total Protein: 6.2

## 2010-10-09 LAB — URINE MICROSCOPIC-ADD ON

## 2010-10-09 LAB — CBC
MCHC: 33.5
MCV: 96.2
RDW: 13.5

## 2010-10-09 LAB — URINE CULTURE

## 2010-10-09 LAB — DIFFERENTIAL
Eosinophils Absolute: 0.1
Lymphocytes Relative: 21
Lymphs Abs: 1.2
Monocytes Relative: 6
Neutrophils Relative %: 72

## 2010-10-09 LAB — URINALYSIS, ROUTINE W REFLEX MICROSCOPIC
Glucose, UA: NEGATIVE
Leukocytes, UA: NEGATIVE
Protein, ur: NEGATIVE
pH: 6.5

## 2010-10-22 LAB — CREATININE, SERUM
Creatinine, Ser: 1.56 — ABNORMAL HIGH
GFR calc Af Amer: 40 — ABNORMAL LOW
GFR calc non Af Amer: 33 — ABNORMAL LOW

## 2011-06-11 ENCOUNTER — Encounter (HOSPITAL_COMMUNITY): Payer: Self-pay | Admitting: Emergency Medicine

## 2011-06-11 ENCOUNTER — Emergency Department (HOSPITAL_COMMUNITY): Payer: PRIVATE HEALTH INSURANCE

## 2011-06-11 ENCOUNTER — Inpatient Hospital Stay (HOSPITAL_COMMUNITY)
Admission: EM | Admit: 2011-06-11 | Discharge: 2011-06-18 | DRG: 194 | Disposition: A | Discharge status: Skilled Nursing Facility | Payer: PRIVATE HEALTH INSURANCE | Attending: Internal Medicine | Admitting: Internal Medicine

## 2011-06-11 DIAGNOSIS — M25469 Effusion, unspecified knee: Secondary | ICD-10-CM | POA: Diagnosis not present

## 2011-06-11 DIAGNOSIS — N183 Chronic kidney disease, stage 3 unspecified: Secondary | ICD-10-CM | POA: Diagnosis present

## 2011-06-11 DIAGNOSIS — M109 Gout, unspecified: Secondary | ICD-10-CM | POA: Diagnosis not present

## 2011-06-11 DIAGNOSIS — E0590 Thyrotoxicosis, unspecified without thyrotoxic crisis or storm: Secondary | ICD-10-CM | POA: Diagnosis present

## 2011-06-11 DIAGNOSIS — I82409 Acute embolism and thrombosis of unspecified deep veins of unspecified lower extremity: Secondary | ICD-10-CM | POA: Insufficient documentation

## 2011-06-11 DIAGNOSIS — I129 Hypertensive chronic kidney disease with stage 1 through stage 4 chronic kidney disease, or unspecified chronic kidney disease: Secondary | ICD-10-CM | POA: Diagnosis present

## 2011-06-11 DIAGNOSIS — H409 Unspecified glaucoma: Secondary | ICD-10-CM | POA: Diagnosis present

## 2011-06-11 DIAGNOSIS — M1710 Unilateral primary osteoarthritis, unspecified knee: Secondary | ICD-10-CM | POA: Diagnosis present

## 2011-06-11 DIAGNOSIS — E872 Acidosis: Secondary | ICD-10-CM

## 2011-06-11 DIAGNOSIS — E079 Disorder of thyroid, unspecified: Secondary | ICD-10-CM | POA: Diagnosis present

## 2011-06-11 DIAGNOSIS — D696 Thrombocytopenia, unspecified: Secondary | ICD-10-CM

## 2011-06-11 DIAGNOSIS — I10 Essential (primary) hypertension: Secondary | ICD-10-CM | POA: Diagnosis present

## 2011-06-11 DIAGNOSIS — I4891 Unspecified atrial fibrillation: Secondary | ICD-10-CM | POA: Diagnosis present

## 2011-06-11 DIAGNOSIS — N179 Acute kidney failure, unspecified: Secondary | ICD-10-CM

## 2011-06-11 DIAGNOSIS — J189 Pneumonia, unspecified organism: Principal | ICD-10-CM | POA: Diagnosis present

## 2011-06-11 HISTORY — DX: Essential (primary) hypertension: I10

## 2011-06-11 HISTORY — DX: Gout, unspecified: M10.9

## 2011-06-11 HISTORY — DX: Disorder of thyroid, unspecified: E07.9

## 2011-06-11 LAB — CBC
Hemoglobin: 11.3 g/dL — ABNORMAL LOW (ref 12.0–15.0)
Platelets: 139 10*3/uL — ABNORMAL LOW (ref 150–400)
RBC: 3.39 MIL/uL — ABNORMAL LOW (ref 3.87–5.11)
WBC: 15.1 10*3/uL — ABNORMAL HIGH (ref 4.0–10.5)

## 2011-06-11 LAB — DIFFERENTIAL
Basophils Absolute: 0 10*3/uL (ref 0.0–0.1)
Basophils Relative: 0 % (ref 0–1)
Eosinophils Relative: 0 % (ref 0–5)
Lymphocytes Relative: 12 % (ref 12–46)
Lymphs Abs: 1.9 10*3/uL (ref 0.7–4.0)
Monocytes Absolute: 0.9 10*3/uL (ref 0.1–1.0)
Neutro Abs: 12.3 10*3/uL — ABNORMAL HIGH (ref 1.7–7.7)
Neutrophils Relative %: 81 % — ABNORMAL HIGH (ref 43–77)

## 2011-06-11 LAB — BLOOD GAS, ARTERIAL
Acid-base deficit: 6.6 mmol/L — ABNORMAL HIGH (ref 0.0–2.0)
Bicarbonate: 16.9 mEq/L — ABNORMAL LOW (ref 20.0–24.0)
Drawn by: 244901
O2 Content: 2 L/min
O2 Saturation: 99.1 %
Patient temperature: 100.1
TCO2: 15.9 mmol/L (ref 0–100)
pCO2 arterial: 29.2 mmHg — ABNORMAL LOW (ref 35.0–45.0)
pH, Arterial: 7.384 (ref 7.350–7.400)
pO2, Arterial: 102 mmHg — ABNORMAL HIGH (ref 80.0–100.0)

## 2011-06-11 LAB — COMPREHENSIVE METABOLIC PANEL
AST: 15 U/L (ref 0–37)
Albumin: 3.8 g/dL (ref 3.5–5.2)
CO2: 20 mEq/L (ref 19–32)
Calcium: 11.2 mg/dL — ABNORMAL HIGH (ref 8.4–10.5)
Creatinine, Ser: 2.57 mg/dL — ABNORMAL HIGH (ref 0.50–1.10)
GFR calc non Af Amer: 17 mL/min — ABNORMAL LOW (ref >90)

## 2011-06-11 LAB — URINALYSIS, ROUTINE W REFLEX MICROSCOPIC
Leukocytes, UA: NEGATIVE
Specific Gravity, Urine: 1.023 (ref 1.005–1.030)
Urobilinogen, UA: 0.2 mg/dL (ref 0.0–1.0)

## 2011-06-11 LAB — URINE MICROSCOPIC-ADD ON

## 2011-06-11 LAB — STREP PNEUMONIAE URINARY ANTIGEN: Strep Pneumo Urinary Antigen: NEGATIVE

## 2011-06-11 LAB — PROCALCITONIN: Procalcitonin: 21.76 ng/mL

## 2011-06-11 MED ORDER — DEXTROSE 5 % IV SOLN
500.0000 mg | INTRAVENOUS | Status: DC
  Administered 2011-06-11 – 2011-06-14 (×3): 500 mg via INTRAVENOUS
  Filled 2011-06-11 (×4): qty 500

## 2011-06-11 MED ORDER — CEFTRIAXONE SODIUM 1 G IJ SOLR
1.0000 g | INTRAMUSCULAR | Status: DC
  Administered 2011-06-11 – 2011-06-14 (×3): 1 g via INTRAVENOUS
  Filled 2011-06-11 (×4): qty 10

## 2011-06-11 MED ORDER — VANCOMYCIN HCL 1000 MG IV SOLR: 750.0000 mg | INTRAVENOUS | Status: DC

## 2011-06-11 MED ORDER — ACETAMINOPHEN 325 MG PO TABS
650.0000 mg | ORAL_TABLET | Freq: Once | ORAL | Status: AC
  Administered 2011-06-11: 650 mg via ORAL
  Filled 2011-06-11: qty 2

## 2011-06-11 MED ORDER — ACETAMINOPHEN 325 MG PO TABS
ORAL_TABLET | ORAL | Status: AC
  Administered 2011-06-11: 650 mg
  Filled 2011-06-11: qty 2

## 2011-06-11 MED ORDER — SODIUM CHLORIDE 0.9 % IV SOLN
Freq: Once | INTRAVENOUS | Status: AC
  Administered 2011-06-11: 17:00:00 via INTRAVENOUS

## 2011-06-11 MED ORDER — SODIUM CHLORIDE 0.9 % IV SOLN: INTRAVENOUS | Status: DC

## 2011-06-11 MED ORDER — VANCOMYCIN HCL IN DEXTROSE 1-5 GM/200ML-% IV SOLN
1000.0000 mg | Freq: Once | INTRAVENOUS | Status: AC
  Administered 2011-06-11: 1000 mg via INTRAVENOUS
  Filled 2011-06-11: qty 200

## 2011-06-11 MED ORDER — SODIUM CHLORIDE 0.9 % IV SOLN
INTRAVENOUS | Status: DC
  Administered 2011-06-12 – 2011-06-14 (×3): via INTRAVENOUS

## 2011-06-11 NOTE — Progress Notes (Signed)
ANTIBIOTIC CONSULT NOTE - INITIAL  Pharmacy Consult for:  Vancomycin, Azithromycin, Rocephin Indication: Suspected pneumonia (CAP)  No Known Allergies  Patient Measurements: Height: 5\' 3"  (160 cm) (as stated by patient) Weight: 136 lb (61.689 kg) (as stated by patient) IBW/kg (Calculated) : 52.4   Vital Signs: Temp: 100.1 F (37.8 C) (05/31 1821) Temp src: Oral (05/31 1821) BP: 137/54 mmHg (05/31 1901) Pulse Rate: 97  (05/31 1845)  Labs:  Basename 06/11/11 1710  WBC 15.1*  HGB 11.3*  PLT 139*  LABCREA --  CREATININE 2.57*   Estimated Creatinine Clearance: 15.6 ml/min (by C-G formula based on Cr of 2.57).  Medical History:  Hyperthyroidism  Hypertension  Gout  Tobacco abuse  Glaucoma  Medications (PTA list):  Zyloprim, Neptazane, Senokot, Diovan-HCT  Assessment: Asked to assist with antibiotic therapy for a 78 year-old female with suspected community-acquired pneumonia, acute renal failure, and hypercalcemia.  Goal of Therapy:   Vancomycin trough levels 15-20 mcg/ml  Eradication of infection  Plan:   Rocephin 1 gram IV every 24 hours  Azithromycin 500 mg IV every 24 hours  Vancomycin 1 gram initially, then 750 mg every 48 hours.  Polo Riley R.Ph. 06/11/2011,9:40 PM

## 2011-06-11 NOTE — ED Notes (Signed)
Pt states that last night she started getting chills, body aches, and feels SOB. Denies CP or abdominal pain.

## 2011-06-11 NOTE — H&P (Signed)
[facility] History and Physical  Aquita Simmering ZOX:[id number] DOB: 12/16/1933 DOA: 06/11/2011   PCP: [name], MD, MD   Chief Complaint:  Chief Complaint  Patient presents with  . Shortness of Breath  . Chills  . Fever     HPI:  78 year old woman with history of hyperthyroidism, gout, tobacco abuse came to the emergency room today with complaints of fevers chills, yellow sputum production and shortness of breath. Evaluation shows acute renal failure, leukocytosis and audible rhonchi. She is now getting tachypneic but denies any chest pain.  Symptoms started gradually  24 hours prior to admission  Review of Systems:  History of glaucoma, fairly independent at home living with her grandson, no nausea no vomiting no diarrhea, no headaches All other systems reviewed and negative or per HPi  Past Medical History  Diagnosis Date  . Thyroid disease   . Hypertension   . Gout    Past Surgical History  Procedure Date  . Abdominal hysterectomy   . Total knee arthroplasty    Social History:  reports that she has been smoking Cigarettes.  She has a 5 pack-year smoking history. She has never used smokeless tobacco. She reports that she does not drink alcohol or use illicit drugs.  No Known Allergies  History reviewed. No pertinent family history.  Prior to Admission medications   Medication Sig Start Date End Date Taking? Authorizing Provider  allopurinol (ZYLOPRIM) 300 MG tablet Take 300 mg by mouth daily.   Yes Historical Provider, MD  methazolamide (NEPTAZANE) 25 MG tablet Take 25 mg by mouth 3 (three) times daily.   Yes Historical Provider, MD  senna (SENOKOT) 8.6 MG tablet Take 1 tablet by mouth daily.   Yes Historical Provider, MD  valsartan-hydrochlorothiazide (DIOVAN-HCT) 160-12.5 MG per tablet Take 1 tablet by mouth daily.   Yes Historical Provider, MD   Physical Exam: Filed Vitals:   06/11/11 1712 06/11/11 1821 06/11/11 1845 06/11/11 1901  BP:   123/41  137/54  Pulse: 89  97   Temp:  100.1 F (37.8 C)    TempSrc:  Oral    Resp:   18   SpO2: 99%  97%      General:  Alert, tachypneic, moderate distress, oriented to place person time  Eyes: perrla  ENT: Clear pharynx  Neck: Palpable thyroid with bruit to, engorged veins of the neck and upper chest  Cardiovascular: Tachycardic, regular normal S1-S2  Respiratory: Bilateral rhonchi and crackles widespread  Abdomen: Soft nontender nondistended bowel sounds are present no palpable hepatosplenomegaly  Skin: Warm without rashes  Musculoskeletal: Frail, decreased muscle bulk and tone  Psychiatric: euthymic  Neurologic: CN 2-12 intact, strength 5/5 all 4, sensation intact  Labs on Admission:  Basic Metabolic Panel:  Lab 06/11/11 8119  NA 139  K 4.0  CL 106  CO2 20  GLUCOSE 112*  BUN 49*  CREATININE 2.57*  CALCIUM 11.2*  MG --  PHOS --   Liver Function Tests:  Lab 06/11/11 1710  AST 15  ALT 5  ALKPHOS 147*  BILITOT 0.5  PROT 7.6  ALBUMIN 3.8   No results found for this basename: LIPASE:5,AMYLASE:5 in the last 168 hours No results found for this basename: AMMONIA:5 in the last 168 hours CBC:  Lab 06/11/11 1710  WBC 15.1*  NEUTROABS 12.3*  HGB 11.3*  HCT 33.5*  MCV 98.8  PLT 139*   Cardiac Enzymes: No results found for this basename: CKTOTAL:5,CKMB:5,CKMBINDEX:5,TROPONINI:5 in the last 168 hours BNP: No components found  with this basename: POCBNP:5 CBG: No results found for this basename: GLUCAP:5 in the last 168 hours  Radiological Exams on Admission: Dg Chest 2 View  06/11/2011  *RADIOLOGY REPORT*  Clinical Data: Pain.  Fever  CHEST - 2 VIEW  Comparison: 08/18/2008  Findings: Heart size appears normal.  No pleural effusion or edema identified.  Plate-like atelectasis is present in both lung bases.  No airspace consolidation.  The visualized osseous structures are unremarkable.  IMPRESSION:  1.  Bibasilar atelectasis  Original Report Authenticated By:  Rosealee Albee, M.D.      Assessment/Plan Principal Problem:  *Community acquired pneumonia Active Problems:  Hypertension  Gout  Thyroid disease  ARF (acute renal failure)  Hypercalcemia   1. Community-acquired pneumonia associated with tachypnea, acute renal failure in a frail elderly woman. She is getting progressively worse in the ED so we will place her in stepdown unit for closer monitoring. We shall obtain an arterial blood gas measurement, pro calcitonin level and pro BNP level. We will send blood cultures, sputum culture, Legionella urine antigen, streptococcal urine antigen and start start empiric treatment with azithromycin Rocephin and vancomycin. 2. Acute renal failure with hypercalcemia - suspect due to dehydration related to the diuretic as well as pneumonia. Plan for IV fluids and followup labs in the morning 3. History of hyperthyroidism - not on meds - will check a TSH  Code Status: full Family Communication: son Disposition Plan: ?  Lonia Blood, MD  [facility] Pager [phone]  If 7PM-7AM, please contact night-coverage [email or web address] Password [email or web address] 06/11/2011, 9:07 PM

## 2011-06-11 NOTE — ED Provider Notes (Signed)
History     CSN: [id number]  Arrival date & time 06/11/11  1548   First MD Initiated Contact with Patient 06/11/11 1606      Chief Complaint  Patient presents with  . Shortness of Breath  . Chills  . Fever    (Consider location/radiation/quality/duration/timing/severity/associated sxs/prior treatment) Patient is a 78 y.o. female presenting with shortness of breath and fever. The history is provided by the patient and a relative.  Shortness of Breath  The current episode started yesterday. The problem occurs continuously. The problem has been gradually worsening. The problem is moderate. The symptoms are relieved by nothing. The symptoms are aggravated by nothing. Associated symptoms include a fever, cough and shortness of breath.  Fever Primary symptoms of the febrile illness include fever, cough and shortness of breath.  h/o pneumonia and sx are similar  Past Medical History  Diagnosis Date  . Thyroid disease   . Hypertension   . Gout     Past Surgical History  Procedure Date  . Abdominal hysterectomy   . Total knee arthroplasty     No family history on file.  History  Substance Use Topics  . Smoking status: Current Everyday Smoker -- 0.2 packs/day    Types: Cigarettes  . Smokeless tobacco: Not on file  . Alcohol Use: No    OB History    Grav Para Term Preterm Abortions TAB SAB Ect Mult Living                  Review of Systems  Constitutional: Positive for fever.  Respiratory: Positive for cough and shortness of breath.   All other systems reviewed and are negative.    Allergies  Review of patient's allergies indicates no known allergies.  Home Medications  No current outpatient prescriptions on file.  BP 123/53  Pulse 90  Temp 98.6 F (37 C)  SpO2 92%  Physical Exam  Nursing note and vitals reviewed. Constitutional: She is oriented to person, place, and time. She appears well-developed and well-nourished.  Non-toxic appearance. No distress.   HENT:  Head: Normocephalic and atraumatic.  Eyes: Conjunctivae, EOM and lids are normal. Pupils are equal, round, and reactive to light.  Neck: Normal range of motion. Neck supple. No tracheal deviation present. No mass present.  Cardiovascular: Normal rate, regular rhythm and normal heart sounds.  Exam reveals no gallop.   No murmur heard. Pulmonary/Chest: Effort normal. No stridor. No respiratory distress. She has no decreased breath sounds. She has no wheezes. She has rhonchi. She has no rales.  Abdominal: Soft. Normal appearance and bowel sounds are normal. She exhibits no distension. There is no tenderness. There is no rebound and no CVA tenderness.  Musculoskeletal: Normal range of motion. She exhibits no edema and no tenderness.  Neurological: She is alert and oriented to person, place, and time. She has normal strength. No cranial nerve deficit or sensory deficit. GCS eye subscore is 4. GCS verbal subscore is 5. GCS motor subscore is 6.  Skin: Skin is warm and dry. No abrasion and no rash noted.  Psychiatric: She has a normal mood and affect. Her speech is normal and behavior is normal.    ED Course  Procedures (including critical care time)   Labs Reviewed  CBC  DIFFERENTIAL  COMPREHENSIVE METABOLIC PANEL  URINALYSIS, ROUTINE W REFLEX MICROSCOPIC  URINE CULTURE  CULTURE, BLOOD (ROUTINE X 2)  CULTURE, BLOOD (ROUTINE X 2)  LACTIC ACID, PLASMA   No results found.   No  diagnosis found.    MDM  Patient started on antibiotics for presumptive pneumonia. She will be admitted by the hospitalist        Toy Baker, MD 06/11/11 [phone]

## 2011-06-12 DIAGNOSIS — E872 Acidosis: Secondary | ICD-10-CM

## 2011-06-12 DIAGNOSIS — D696 Thrombocytopenia, unspecified: Secondary | ICD-10-CM

## 2011-06-12 DIAGNOSIS — N179 Acute kidney failure, unspecified: Secondary | ICD-10-CM

## 2011-06-12 LAB — DIFFERENTIAL
Basophils Absolute: 0 10*3/uL (ref 0.0–0.1)
Basophils Relative: 0 % (ref 0–1)
Lymphocytes Relative: 14 % (ref 12–46)
Monocytes Relative: 7 % (ref 3–12)
Neutro Abs: 9.9 10*3/uL — ABNORMAL HIGH (ref 1.7–7.7)
Neutrophils Relative %: 79 % — ABNORMAL HIGH (ref 43–77)

## 2011-06-12 LAB — EXPECTORATED SPUTUM ASSESSMENT W GRAM STAIN, RFLX TO RESP C: Special Requests: NORMAL

## 2011-06-12 LAB — CBC
Hemoglobin: 10.1 g/dL — ABNORMAL LOW (ref 12.0–15.0)
MCHC: 33.4 g/dL (ref 30.0–36.0)
Platelets: 130 10*3/uL — ABNORMAL LOW (ref 150–400)
RBC: 3.17 MIL/uL — ABNORMAL LOW (ref 3.87–5.11)
RDW: 14.2 % (ref 11.5–15.5)
WBC: 13.2 10*3/uL — ABNORMAL HIGH (ref 4.0–10.5)
WBC: 12.5 10*3/uL — ABNORMAL HIGH (ref 4.0–10.5)

## 2011-06-12 LAB — BASIC METABOLIC PANEL
Calcium: 10.7 mg/dL — ABNORMAL HIGH (ref 8.4–10.5)
Creatinine, Ser: 2.34 mg/dL — ABNORMAL HIGH (ref 0.50–1.10)
GFR calc Af Amer: 22 mL/min — ABNORMAL LOW (ref >90)
GFR calc non Af Amer: 19 mL/min — ABNORMAL LOW (ref >90)
Sodium: 135 mEq/L (ref 135–145)

## 2011-06-12 LAB — TSH: TSH: 0.284 u[IU]/mL — ABNORMAL LOW (ref 0.350–4.500)

## 2011-06-12 LAB — PROCALCITONIN: Procalcitonin: 19.02 ng/mL

## 2011-06-12 LAB — MRSA PCR SCREENING: MRSA by PCR: NEGATIVE

## 2011-06-12 MED ORDER — VANCOMYCIN HCL 500 MG IV SOLR
500.0000 mg | INTRAVENOUS | Status: DC
  Administered 2011-06-13 (×2): 500 mg via INTRAVENOUS
  Filled 2011-06-12 (×4): qty 500

## 2011-06-12 MED ORDER — ACETAMINOPHEN 650 MG RE SUPP: 650.0000 mg | Freq: Four times a day (QID) | RECTAL | Status: DC | PRN

## 2011-06-12 MED ORDER — DOCUSATE SODIUM 100 MG PO CAPS
100.0000 mg | ORAL_CAPSULE | Freq: Two times a day (BID) | ORAL | Status: DC
  Administered 2011-06-12 – 2011-06-18 (×13): 100 mg via ORAL
  Filled 2011-06-12 (×15): qty 1

## 2011-06-12 MED ORDER — ONDANSETRON HCL 4 MG/2ML IJ SOLN
4.0000 mg | Freq: Four times a day (QID) | INTRAMUSCULAR | Status: DC | PRN
  Filled 2011-06-12: qty 2

## 2011-06-12 MED ORDER — ALBUTEROL SULFATE (5 MG/ML) 0.5% IN NEBU
2.5000 mg | INHALATION_SOLUTION | RESPIRATORY_TRACT | Status: DC | PRN
  Filled 2011-06-12: qty 0.5

## 2011-06-12 MED ORDER — ENOXAPARIN SODIUM 40 MG/0.4ML ~~LOC~~ SOLN
40.0000 mg | SUBCUTANEOUS | Status: DC
  Administered 2011-06-12: 40 mg via SUBCUTANEOUS
  Filled 2011-06-12: qty 0.4

## 2011-06-12 MED ORDER — ENOXAPARIN SODIUM 30 MG/0.3ML ~~LOC~~ SOLN
30.0000 mg | SUBCUTANEOUS | Status: DC
  Administered 2011-06-13 – 2011-06-18 (×6): 30 mg via SUBCUTANEOUS
  Filled 2011-06-12 (×6): qty 0.3

## 2011-06-12 MED ORDER — ACETAMINOPHEN 325 MG PO TABS
650.0000 mg | ORAL_TABLET | Freq: Four times a day (QID) | ORAL | Status: DC | PRN
  Administered 2011-06-13 – 2011-06-17 (×5): 650 mg via ORAL
  Filled 2011-06-12: qty 2
  Filled 2011-06-12: qty 1
  Filled 2011-06-12 (×3): qty 2

## 2011-06-12 MED ORDER — ONDANSETRON HCL 4 MG PO TABS
4.0000 mg | ORAL_TABLET | Freq: Four times a day (QID) | ORAL | Status: DC | PRN
  Administered 2011-06-15: 4 mg via ORAL
  Filled 2011-06-12: qty 1

## 2011-06-12 MED ORDER — METHAZOLAMIDE 25 MG PO TABS: 25.0000 mg | ORAL_TABLET | Freq: Three times a day (TID) | ORAL | Status: DC

## 2011-06-12 NOTE — Progress Notes (Signed)
Subjective: Patient has no complaints and states that she feels a lot better than yesterday. Her son is at the bedside. Please note that there is a notation of patient concerns about her finances be best managed by his noted and social work has been consult. However the son is reluctant to leave the bedside this am unable to interview this patient regarding this concern in a safe manner. Objective: Filed Vitals:   06/12/11 0400 06/12/11 0804 06/12/11 0820 06/12/11 1155  BP:   111/60 97/51  Pulse: 101  80 85  Temp: 100.8 F (38.2 C) 100.6 F (38.1 C)    TempSrc:      Resp: 30  25 28   Height:      Weight:      SpO2: 97%  100% 97%   Weight change:   Intake/Output Summary (Last 24 hours) at 06/12/11 1215 Last data filed at 06/12/11 0500  Gross per 24 hour  Intake      0 ml  Output    450 ml  Net   -450 ml    General: Alert, awake, oriented x3, in no acute distress. Son at bedside HEENT: [place]/AT PEERL, EOMI Neck: Trachea midline,  no masses, no thyromegal,y no JVD, no carotid bruit OROPHARYNX:  Moist, No exudate/ erythema/lesions.  Heart: Regular rate and rhythm, without murmurs, rubs, gallops, PMI non-displaced, no heaves or thrills on palpation.  Lungs: Basilar rales noted, but no wheezing present. No increased vocal fremitus resonant to percussion  Abdomen: Soft, nontender, nondistended, positive bowel sounds, no masses no hepatosplenomegaly noted..  Neuro: No focal neurological deficits noted cranial nerves II through XII grossly intact. Musculoskeletal: No warm swelling or erythema around joints, no spinal tenderness noted.   Lab Results:  [facility] 06/12/11 0316 06/11/11 1710  NA 135 139  K 3.5 4.0  CL 104 106  CO2 18* 20  GLUCOSE 111* 112*  BUN 41* 49*  CREATININE 2.34* 2.57*  CALCIUM 10.7* 11.2*  MG -- --  PHOS -- --    Basename 06/11/11 1710  AST 15  ALT 5  ALKPHOS 147*  BILITOT 0.5  PROT 7.6  ALBUMIN 3.8   No results found for this basename:  LIPASE:2,AMYLASE:2 in the last 72 hours  Basename 06/12/11 0316 06/11/11 1710  WBC 13.2* 15.1*  NEUTROABS -- 12.3*  HGB 10.4* 11.3*  HCT 31.4* 33.5*  MCV 99.1 98.8  PLT 130* 139*   No results found for this basename: CKTOTAL:3,CKMB:3,CKMBINDEX:3,TROPONINI:3 in the last 72 hours No components found with this basename: POCBNP:3 No results found for this basename: DDIMER:2 in the last 72 hours No results found for this basename: HGBA1C:2 in the last 72 hours No results found for this basename: CHOL:2,HDL:2,LDLCALC:2,TRIG:2,CHOLHDL:2,LDLDIRECT:2 in the last 72 hours  Basename 06/11/11 2110  TSH 0.284*  T4TOTAL --  T3FREE --  THYROIDAB --   No results found for this basename: VITAMINB12:2,FOLATE:2,FERRITIN:2,TIBC:2,IRON:2,RETICCTPCT:2 in the last 72 hours  Micro Results: Recent Results (from the past 240 hour(s))  MRSA PCR SCREENING     Status: Normal   Collection Time   06/12/11 12:16 AM      Component Value Range Status Comment   MRSA by PCR NEGATIVE  NEGATIVE  Final     Studies/Results: Dg Chest 2 View  06/11/2011  *RADIOLOGY REPORT*  Clinical Data: Pain.  Fever  CHEST - 2 VIEW  Comparison: 08/18/2008  Findings: Heart size appears normal.  No pleural effusion or edema identified.  Plate-like atelectasis is present in both lung bases.  No airspace consolidation.  The visualized osseous structures are unremarkable.  IMPRESSION:  1.  Bibasilar atelectasis  Original Report Authenticated By: Rosealee Albee, M.D.    Medications: I have reviewed the patient's current medications. Scheduled Meds:   . sodium chloride   Intravenous Once  . acetaminophen      . acetaminophen  650 mg Oral Once  . azithromycin  500 mg Intravenous Q24H  . cefTRIAXone (ROCEPHIN)  IV  1 g Intravenous Q24H  . docusate sodium  100 mg Oral BID  . enoxaparin  40 mg Subcutaneous Q24H  . vancomycin  750 mg Intravenous Q48H  . vancomycin  1,000 mg Intravenous Once  . DISCONTD: methazolamide  25 mg Oral TID     Continuous Infusions:   . sodium chloride    . DISCONTD: sodium chloride     PRN Meds:.acetaminophen, acetaminophen, albuterol, ondansetron (ZOFRAN) IV, ondansetron Assessment/Plan: Patient Active Hospital Problem List: Community acquired pneumonia (06/11/2011)   Assessment: Patient appears to be improving. We'll continue current antibiotics     Hypertension ()   Assessment: Patient mildly hypotensive. I believe this is secondary to some volume depletion and will institute IV fluids.     ARF (acute renal failure) (06/11/2011)   Assessment: I reviewed the patient's laboratory studies from 2009. At that time the patient had a creatinine which is consistent with a CKD stage II. Suspected the patient may have a small component of acute renal failure but likely most of this is chronic. I will check with Dr. Chestine Spore to get a better idea of the patient's baseline renal function.  Hyperthyroidism (06/11/2011)   Assessment: Continue methimazole     CKD (chronic kidney disease), stage III (06/12/2011)   Assessment: See above      LOS: 1 day

## 2011-06-13 DIAGNOSIS — D696 Thrombocytopenia, unspecified: Secondary | ICD-10-CM

## 2011-06-13 DIAGNOSIS — N179 Acute kidney failure, unspecified: Secondary | ICD-10-CM

## 2011-06-13 LAB — CBC
Hemoglobin: 9.3 g/dL — ABNORMAL LOW (ref 12.0–15.0)
MCH: 33 pg (ref 26.0–34.0)
Platelets: 124 10*3/uL — ABNORMAL LOW (ref 150–400)
RBC: 2.82 MIL/uL — ABNORMAL LOW (ref 3.87–5.11)
WBC: 10.3 10*3/uL (ref 4.0–10.5)

## 2011-06-13 LAB — URINE CULTURE: Culture: NO GROWTH

## 2011-06-13 LAB — BASIC METABOLIC PANEL
BUN: 36 mg/dL — ABNORMAL HIGH (ref 6–23)
Calcium: 10 mg/dL (ref 8.4–10.5)
GFR calc non Af Amer: 20 mL/min — ABNORMAL LOW (ref >90)
Glucose, Bld: 102 mg/dL — ABNORMAL HIGH (ref 70–99)
Sodium: 138 mEq/L (ref 135–145)

## 2011-06-13 LAB — LEGIONELLA ANTIGEN, URINE

## 2011-06-13 MED ORDER — RANITIDINE HCL 150 MG/10ML PO SYRP
150.0000 mg | ORAL_SOLUTION | Freq: Two times a day (BID) | ORAL | Status: DC | PRN
  Administered 2011-06-14 (×2): 150 mg via ORAL
  Filled 2011-06-13 (×2): qty 10

## 2011-06-13 NOTE — Progress Notes (Signed)
Amb. In hall with assist of one and cane. Toll fairly well.  Must amb slowly with use of cane in right hand and holding to wall side rail with left hand. States "I'm going to need help at home".  Left knee swollen and deformed. Pt states she thinks she is going to need a knee replacement. Had right knee replaced 15 years ago due to arthritis.  O2 sat 97% after amb.,. Had been on R/A approx. 45 min.,.

## 2011-06-13 NOTE — Progress Notes (Signed)
Subjective: Patient has no complaints and states that she feels a lot better than yesterday. Her son is at the bedside. Please note that there is a notation of patient concerns about her finances be best managed by his noted and social work has been consult. However the son is reluctant to leave the bedside this am unable to interview this patient regarding this concern in a safe manner. Objective: Filed Vitals:   06/12/11 2000 06/12/11 2347 06/13/11 0600 06/13/11 1500  BP:  121/83 120/72   Pulse:  90 80   Temp: 99.5 F (37.5 C) 98.2 F (36.8 C) 99.1 F (37.3 C)   TempSrc: Oral Oral Oral   Resp:  22 18   Height:      Weight:      SpO2:  100% 98% 97%   Weight change:   Intake/Output Summary (Last 24 hours) at 06/13/11 1608 Last data filed at 06/13/11 0800  Gross per 24 hour  Intake   1320 ml  Output    400 ml  Net    920 ml    General: Alert, awake, oriented x3, in no acute distress. Son at bedside HEENT: [place]/AT PEERL, EOMI Neck: Trachea midline,  no masses, no thyromegal,y no JVD, no carotid bruit OROPHARYNX:  Moist, No exudate/ erythema/lesions.  Heart: Regular rate and rhythm, without murmurs, rubs, gallops, PMI non-displaced, no heaves or thrills on palpation.  Lungs: Basilar rales noted, but no wheezing present. No increased vocal fremitus resonant to percussion  Abdomen: Soft, nontender, nondistended, positive bowel sounds, no masses no hepatosplenomegaly noted..  Neuro: No focal neurological deficits noted cranial nerves II through XII grossly intact. Musculoskeletal: No warm swelling or erythema around joints, no spinal tenderness noted.   Lab Results:  [facility] 06/13/11 0615 06/12/11 0316  NA 138 135  K 3.7 3.5  CL 109 104  CO2 17* 18*  GLUCOSE 102* 111*  BUN 36* 41*  CREATININE 2.29* 2.34*  CALCIUM 10.0 10.7*  MG -- --  PHOS -- --    Basename 06/11/11 1710  AST 15  ALT 5  ALKPHOS 147*  BILITOT 0.5  PROT 7.6  ALBUMIN 3.8   No results found for this  basename: LIPASE:2,AMYLASE:2 in the last 72 hours  Basename 06/13/11 0500 06/12/11 1305 06/11/11 1710  WBC 10.3 12.5* --  NEUTROABS -- 9.9* 12.3*  HGB 9.3* 10.1* --  HCT 28.0* 30.2* --  MCV 99.3 101.7* --  PLT 124* 126* --   No results found for this basename: CKTOTAL:3,CKMB:3,CKMBINDEX:3,TROPONINI:3 in the last 72 hours No components found with this basename: POCBNP:3 No results found for this basename: DDIMER:2 in the last 72 hours No results found for this basename: HGBA1C:2 in the last 72 hours No results found for this basename: CHOL:2,HDL:2,LDLCALC:2,TRIG:2,CHOLHDL:2,LDLDIRECT:2 in the last 72 hours  Basename 06/11/11 2110  TSH 0.284*  T4TOTAL --  T3FREE --  THYROIDAB --   No results found for this basename: VITAMINB12:2,FOLATE:2,FERRITIN:2,TIBC:2,IRON:2,RETICCTPCT:2 in the last 72 hours  Micro Results: Recent Results (from the past 240 hour(s))  CULTURE, BLOOD (ROUTINE X 2)     Status: Normal (Preliminary result)   Collection Time   06/11/11  5:05 PM      Component Value Range Status Comment   Specimen Description BLOOD LEFT ARM   Final    Special Requests BOTTLES DRAWN AEROBIC AND ANAEROBIC   Final    Culture  Setup Time [id number]   Final    Culture     Final  Value:        BLOOD CULTURE RECEIVED NO GROWTH TO DATE CULTURE WILL BE HELD FOR 5 DAYS BEFORE ISSUING A FINAL NEGATIVE REPORT   Report Status PENDING   Incomplete   URINE CULTURE     Status: Normal   Collection Time   06/11/11  5:48 PM      Component Value Range Status Comment   Specimen Description URINE, CATHETERIZED   Final    Special Requests NONE   Final    Culture  Setup Time [id number]   Final    Colony Count NO GROWTH   Final    Culture NO GROWTH   Final    Report Status 06/13/2011 FINAL   Final   CULTURE, BLOOD (ROUTINE X 2)     Status: Normal (Preliminary result)   Collection Time   06/11/11  6:10 PM      Component Value Range Status Comment   Specimen Description BLOOD LEFT HAND    Final    Special Requests BOTTLES DRAWN AEROBIC ONLY 2CC   Final    Culture  Setup Time [id number]   Final    Culture     Final    Value:        BLOOD CULTURE RECEIVED NO GROWTH TO DATE CULTURE WILL BE HELD FOR 5 DAYS BEFORE ISSUING A FINAL NEGATIVE REPORT   Report Status PENDING   Incomplete   MRSA PCR SCREENING     Status: Normal   Collection Time   06/12/11 12:16 AM      Component Value Range Status Comment   MRSA by PCR NEGATIVE  NEGATIVE  Final   CULTURE, EXPECTORATED SPUTUM-ASSESSMENT     Status: Normal   Collection Time   06/12/11  3:46 PM      Component Value Range Status Comment   Specimen Description SPUTUM   Final    Special Requests Normal   Final    Sputum evaluation     Final    Value: THIS SPECIMEN IS ACCEPTABLE. RESPIRATORY CULTURE REPORT TO FOLLOW.   Report Status 06/12/2011 FINAL   Final   CULTURE, RESPIRATORY     Status: Normal (Preliminary result)   Collection Time   06/12/11  3:46 PM      Component Value Range Status Comment   Specimen Description SPUTUM   Final    Special Requests NONE   Final    Gram Stain PENDING   Incomplete    Culture NO GROWTH   Final    Report Status PENDING   Incomplete     Studies/Results: Dg Chest 2 View  06/11/2011  *RADIOLOGY REPORT*  Clinical Data: Pain.  Fever  CHEST - 2 VIEW  Comparison: 08/18/2008  Findings: Heart size appears normal.  No pleural effusion or edema identified.  Plate-like atelectasis is present in both lung bases.  No airspace consolidation.  The visualized osseous structures are unremarkable.  IMPRESSION:  1.  Bibasilar atelectasis  Original Report Authenticated By: Rosealee Albee, M.D.    Medications: I have reviewed the patient's current medications. Scheduled Meds:    . azithromycin  500 mg Intravenous Q24H  . cefTRIAXone (ROCEPHIN)  IV  1 g Intravenous Q24H  . docusate sodium  100 mg Oral BID  . enoxaparin (LOVENOX) injection  30 mg Subcutaneous Q24H  . vancomycin  500 mg Intravenous Q24H    Continuous Infusions:    . sodium chloride 75 mL/hr at 06/13/11 1127   PRN Meds:.acetaminophen, acetaminophen, albuterol, ondansetron (ZOFRAN)  IV, ondansetron Assessment/Plan: Patient Active Hospital Problem List: Community acquired pneumonia (06/11/2011)   Assessment: Patient clinically improved. Continue current antibiotics     Hypertension ()   Assessment: Blood pressure back to normal.    ARF (acute renal failure) (06/11/2011)   Assessment: I reviewed the patient's laboratory studies from 2009. At that time the patient had a creatinine which is consistent with a CKD stage II. Suspected the patient may have a small component of acute renal failure but likely most of this is chronic. I will check with Dr. Chestine Spore to get a better idea of the patient's baseline renal function.  Hyperthyroidism (06/11/2011)   Assessment: Continue methimazole     CKD (chronic kidney disease), stage III (06/12/2011)   Assessment: See above    Generalized weakness (06/13/2011)   Assessment: Patient ambulating the hallway with nursing using the cane. However the patient was significantly unsteady. We are asked physical therapy to evaluate the patient and make further recommendations.   LOS: 2 days

## 2011-06-14 DIAGNOSIS — D696 Thrombocytopenia, unspecified: Secondary | ICD-10-CM

## 2011-06-14 DIAGNOSIS — N183 Chronic kidney disease, stage 3 (moderate): Secondary | ICD-10-CM

## 2011-06-14 DIAGNOSIS — N189 Chronic kidney disease, unspecified: Secondary | ICD-10-CM

## 2011-06-14 LAB — BASIC METABOLIC PANEL
BUN: 33 mg/dL — ABNORMAL HIGH (ref 6–23)
CO2: 19 mEq/L (ref 19–32)
Calcium: 10.1 mg/dL (ref 8.4–10.5)
Chloride: 110 mEq/L (ref 96–112)
Creatinine, Ser: 2.24 mg/dL — ABNORMAL HIGH (ref 0.50–1.10)
Glucose, Bld: 104 mg/dL — ABNORMAL HIGH (ref 70–99)

## 2011-06-14 MED ORDER — ZOLPIDEM TARTRATE 5 MG PO TABS
5.0000 mg | ORAL_TABLET | Freq: Once | ORAL | Status: AC
  Administered 2011-06-14: 5 mg via ORAL
  Filled 2011-06-14: qty 1

## 2011-06-14 MED ORDER — LEVOFLOXACIN 250 MG PO TABS
250.0000 mg | ORAL_TABLET | Freq: Every day | ORAL | Status: DC
  Administered 2011-06-15 – 2011-06-18 (×4): 250 mg via ORAL
  Filled 2011-06-14 (×4): qty 1

## 2011-06-14 MED ORDER — LEVOFLOXACIN 500 MG PO TABS
500.0000 mg | ORAL_TABLET | Freq: Once | ORAL | Status: AC
  Administered 2011-06-14: 500 mg via ORAL
  Filled 2011-06-14: qty 1

## 2011-06-14 MED ORDER — LEVOFLOXACIN 250 MG PO TABS: 250.0000 mg | ORAL_TABLET | Freq: Every day | ORAL | Status: DC

## 2011-06-14 MED ORDER — PANTOPRAZOLE SODIUM 40 MG PO TBEC
40.0000 mg | DELAYED_RELEASE_TABLET | Freq: Two times a day (BID) | ORAL | Status: DC
  Administered 2011-06-14 – 2011-06-18 (×9): 40 mg via ORAL
  Filled 2011-06-14 (×10): qty 1

## 2011-06-14 NOTE — Progress Notes (Signed)
Clinical Social Work Department BRIEF PSYCHOSOCIAL ASSESSMENT 06/14/2011  Patient:  [name] Alexander, [name] Alexander     Account Number:  [id number]     Admit date:  06/11/2011  Clinical Social Worker:  Skip Mayer  Date/Time:  06/14/2011 11:20 AM  Referred by:  Physician  Date Referred:  06/13/2011 Referred for  Other - See comment   Other Referral:   Financial concerns   Interview type:  Patient Other interview type:    PSYCHOSOCIAL DATA Living Status:  FAMILY Admitted from facility:   Level of care:   Primary support name:  [name] Primary support relationship to patient:  CHILD, ADULT Degree of support available:   Inadequate, per pt    CURRENT CONCERNS Current Concerns  Financial Resources   Other Concerns:    SOCIAL WORK ASSESSMENT / PLAN CSW met with pt to address consult. Pt reports admitted from home where she lives with her grandson. Pt reports grandson is "no help." Pt reports she has been making mortgage payment and that her son is responsible for making all other payments, including utilities and medical bills, from their joint account.  Pt reports this system was adequate until recently. Pt reports her son has not been making payments on-time and pt fears "lights will be cut off." Pt reports attempt to have her check removed from joint account and seperate accounts set-up, but bank informed her that her son's consent would be needed as his name is on the account as well.  Pt reports she does not feel comfortable talking to her son or dtr-in-law about removing him from her account and requested CSW not speak to them either. Pt reports she does not feel abused, neglected, or exploited, but is not sure where her money (fixed income) is going.  Pt reports multiple converstaions with her son re: importance of paying bills on-time, but those coversations have not been effective.  CSW suggested DSS act as a payee for pt. Pt reports agreeable to this service as she does not feel she  can manage her finances on her own, but does not want her son involved any longer.  CSW contacted DSS payee program [phone] and left message requesting return call. DSS has 48 hours to respond to request. CSW assigned to this pt will f/u tomorrow if no return call today.   Assessment/plan status:   Other assessment/ plan:   Information/referral to community resources:   DSS payee [phone]  DSS APS [phone]    PATIENT'S/FAMILY'S RESPONSE TO PLAN OF CARE: Pt verbalized appreciation for CSW support and assistance with financial issues. Pt reports agreeable to having DSS act as payee.        Dellie Burns, MSW, [place] [phone]

## 2011-06-14 NOTE — Evaluation (Signed)
Physical Therapy Evaluation Patient Details Name: [name] Alexander MRN: [id number] DOB: 20-Apr-1933 Today's Date: 06/14/2011 Time: 7829-5621 PT Time Calculation (min): 14 min  PT Assessment / Plan / Recommendation Clinical Impression  Pt diagnosed with community acquired pneumonia.  Pt would benefit from acute PT services in order to increase independence with transfers and ambulation by improving overall strength and activity tolerance to prepare for d/c to next venue.    PT Assessment  Patient needs continued PT services    Follow Up Recommendations  Supervision/Assistance - 24 hour;Skilled nursing facility    Barriers to Discharge Decreased caregiver support      lEquipment Recommendations  Defer to next venue    Recommendations for Other Services     Frequency Min 3X/week    Precautions / Restrictions Precautions Precautions: Fall Restrictions Weight Bearing Restrictions: No   Pertinent Vitals/Pain Pt reports no pain.     Mobility  Bed Mobility Bed Mobility: Supine to Sit;Sit to Supine Supine to Sit: 4: Min assist;HOB elevated Sit to Supine: 4: Min assist;HOB flat Details for Bed Mobility Assistance: assist for L LE Transfers Transfers: Sit to Stand;Stand to Sit;Stand Pivot Transfers Sit to Stand: 4: Min assist Stand to Sit: 3: Mod assist Stand Pivot Transfers: 3: Mod assist Details for Transfer Assistance: pt requiring increased assist with transfer to [facility] (poor LE control and uncontrolled descent requiring assist for safety), however after using BSC demonstrates at min assist level Ambulation/Gait Ambulation/Gait Assistance: 4: Min assist Ambulation Distance (Feet): 80 Feet Assistive device: Rolling walker Ambulation/Gait Assistance Details: increased verbal cues for safe RW use (esp keeping RW close) Gait Pattern: Antalgic;Decreased stance time - left;Step-to pattern Gait velocity: decreased    Exercises     PT Diagnosis: Difficulty walking;Generalized weakness   PT Problem List: Decreased strength;Decreased activity tolerance;Decreased mobility;Decreased safety awareness;Decreased knowledge of use of DME PT Treatment Interventions: DME instruction;Functional mobility training;Gait training;Therapeutic activities;Therapeutic exercise;Patient/family education;Balance training   PT Goals Acute Rehab PT Goals PT Goal Formulation: With patient Time For Goal Achievement: 06/28/11 Potential to Achieve Goals: Good Pt will go Supine/Side to Sit: with modified independence PT Goal: Supine/Side to Sit - Progress: Goal set today Pt will go Sit to Stand: with modified independence PT Goal: Sit to Stand - Progress: Goal set today Pt will go Stand to Sit: with modified independence PT Goal: Stand to Sit - Progress: Goal set today Pt will Ambulate: >150 feet;with modified independence;with least restrictive assistive device PT Goal: Ambulate - Progress: Goal set today  Visit Information  Last PT Received On: 06/14/11 Assistance Needed: +2    Subjective Data  Subjective: "oh good, I need some help to the bathroom."   Prior Functioning  Home Living Lives With: Other (Comment) (grandson) Home Adaptive Equipment: Straight cane Additional Comments: Pt reports she'll need assist upon d/c.  Agreeable to rehab. Prior Function Level of Independence: Independent with assistive device(s) Communication Communication: No difficulties    Cognition  Overall Cognitive Status: Appears within functional limits for tasks assessed/performed Arousal/Alertness: Awake/alert Orientation Level: Appears intact for tasks assessed Behavior During Session: [facility] for tasks performed    Extremity/Trunk Assessment Right Lower Extremity Assessment RLE ROM/Strength/Tone: [facility] for tasks assessed Left Lower Extremity Assessment LLE ROM/Strength/Tone: Deficits LLE ROM/Strength/Tone Deficits: decreased movement against gravity observed, pt uses UEs to lift leg into bed, pt reports she  needs L TKR   Balance    End of Session PT - End of Session Equipment Utilized During Treatment: Gait belt Activity Tolerance: Patient limited by  fatigue Patient left: in bed;with call bell/phone within reach;with nursing in room   [facility] E 06/14/2011, 11:36 AM Pager: [phone]

## 2011-06-14 NOTE — Progress Notes (Signed)
Pharmacy - Antibiotic adjustment for renal function.  IV antiviotics discontinued today, to begin Levaquin po. Clearance poor, less than 20 ml/min, will adjust medication.  Levaquin 500mg  po x 1 today, then follow with 250mg  po daily x 3 more doses. Antibiotics had been requested for seven days.  Thank you,  Otho Bellows Pharmd 06/14/2011 2:30 PM

## 2011-06-14 NOTE — Progress Notes (Signed)
Subjective: Patient has no complaints and states that she feels a lot better than yesterday.  PT recommendations noted. Pt not interested in rehab. Pt c/o of pain in lateral right foot. No redness, erythema or swelling noted.  Interval History: SW input noted. Spoke with Dr. Chestine Spore who reports that Cr. In 03/2011 was 2.2. Also made him aware of psychosocial concerns.  Objective: Filed Vitals:   06/13/11 1637 06/13/11 2353 06/14/11 0628 06/14/11 1350  BP: 130/77 142/81 138/77 141/82  Pulse: 87 85 89 88  Temp: 98.4 F (36.9 C) 100.2 F (37.9 C) 98.3 F (36.8 C) 98.4 F (36.9 C)  TempSrc: Oral Oral Oral Oral  Resp: 18 20 18 20   Height:      Weight:      SpO2: 98% 98% 91% 95%   Weight change:   Intake/Output Summary (Last 24 hours) at 06/14/11 1403 Last data filed at 06/14/11 1102  Gross per 24 hour  Intake   1065 ml  Output    300 ml  Net    765 ml    General: Alert, awake, oriented x3, in no acute distress.  HEENT: [place]/AT [place]ER[place], EOMI Neck: Trachea midline,  no masses, no thyromegal,y no JVD, no carotid bruit OROPHARYNX:  Moist, No exudate/ erythema/lesions.  Heart: Regular rate and rhythm, without murmurs, rubs, gallops, PMI non-displaced, no heaves or thrills on palpation.  Lungs: Basilar rales noted, but no wheezing present. No increased vocal fremitus resonant to percussion  Abdomen: Soft, nontender, nondistended, positive bowel sounds, no masses no hepatosplenomegaly noted..  Neuro: No focal neurological deficits noted cranial nerves II through XII grossly intact. Musculoskeletal: No warm swelling or erythema around joints, no spinal tenderness noted.   Lab Results:  Basename 06/14/11 0500 06/13/11 0615  NA 139 138  K 3.7 3.7  CL 110 109  CO2 19 17*  GLUCOSE 104* 102*  BUN 33* 36*  CREATININE 2.24* 2.29*  CALCIUM 10.1 10.0  MG -- --  PHOS -- --    Basename 06/11/11 1710  AST 15  ALT 5  ALKPHOS 147*  BILITOT 0.5  PROT 7.6  ALBUMIN 3.8   No results found  for this basename: LIPASE:2,AMYLASE:2 in the last 72 hours  Basename 06/13/11 0500 06/12/11 1305 06/11/11 1710  WBC 10.3 12.5* --  NEUTROABS -- 9.9* 12.3*  HGB 9.3* 10.1* --  HCT 28.0* 30.2* --  MCV 99.3 101.7* --  PLT 124* 126* --   No results found for this basename: CKTOTAL:3,CKMB:3,CKMBINDEX:3,TROPONINI:3 in the last 72 hours No components found with this basename: POCBNP:3 No results found for this basename: DDIMER:2 in the last 72 hours No results found for this basename: HGBA1C:2 in the last 72 hours No results found for this basename: CHOL:2,HDL:2,LDLCALC:2,TRIG:2,CHOLHDL:2,LDLDIRECT:2 in the last 72 hours  Basename 06/11/11 2110  TSH 0.284*  T4TOTAL --  T3FREE --  THYROIDAB --   No results found for this basename: VITAMINB12:2,FOLATE:2,FERRITIN:2,TIBC:2,IRON:2,RETICCTPCT:2 in the last 72 hours  Micro Results: Recent Results (from the past 240 hour(s))  CULTURE, BLOOD (ROUTINE X 2)     Status: Normal (Preliminary result)   Collection Time   06/11/11  5:05 PM      Component Value Range Status Comment   Specimen Description BLOOD LEFT ARM   Final    Special Requests BOTTLES DRAWN AEROBIC AND ANAEROBIC   Final    Culture  Setup Time [id number]   Final    Culture     Final    Value:  BLOOD CULTURE RECEIVED NO GROWTH TO DATE CULTURE WILL BE HELD FOR 5 DAYS BEFORE ISSUING A FINAL NEGATIVE REPORT   Report Status PENDING   Incomplete   URINE CULTURE     Status: Normal   Collection Time   06/11/11  5:48 PM      Component Value Range Status Comment   Specimen Description URINE, CATHETERIZED   Final    Special Requests NONE   Final    Culture  Setup Time [id number]   Final    Colony Count NO GROWTH   Final    Culture NO GROWTH   Final    Report Status 06/13/2011 FINAL   Final   CULTURE, BLOOD (ROUTINE X 2)     Status: Normal (Preliminary result)   Collection Time   06/11/11  6:10 PM      Component Value Range Status Comment   Specimen Description BLOOD LEFT  HAND   Final    Special Requests BOTTLES DRAWN AEROBIC ONLY 2CC   Final    Culture  Setup Time [id number]   Final    Culture     Final    Value:        BLOOD CULTURE RECEIVED NO GROWTH TO DATE CULTURE WILL BE HELD FOR 5 DAYS BEFORE ISSUING A FINAL NEGATIVE REPORT   Report Status PENDING   Incomplete   MRSA PCR SCREENING     Status: Normal   Collection Time   06/12/11 12:16 AM      Component Value Range Status Comment   MRSA by PCR NEGATIVE  NEGATIVE  Final   CULTURE, EXPECTORATED SPUTUM-ASSESSMENT     Status: Normal   Collection Time   06/12/11  3:46 PM      Component Value Range Status Comment   Specimen Description SPUTUM   Final    Special Requests Normal   Final    Sputum evaluation     Final    Value: THIS SPECIMEN IS ACCEPTABLE. RESPIRATORY CULTURE REPORT TO FOLLOW.   Report Status 06/12/2011 FINAL   Final   CULTURE, RESPIRATORY     Status: Normal (Preliminary result)   Collection Time   06/12/11  3:46 PM      Component Value Range Status Comment   Specimen Description SPUTUM   Final    Special Requests NONE   Final    Gram Stain     Final    Value: ABUNDANT WBC PRESENT, PREDOMINANTLY PMN     RARE SQUAMOUS EPITHELIAL CELLS PRESENT     NO ORGANISMS SEEN   Culture NORMAL OROPHARYNGEAL FLORA   Final    Report Status PENDING   Incomplete     Studies/Results: Dg Chest 2 View  06/11/2011  *RADIOLOGY REPORT*  Clinical Data: Pain.  Fever  CHEST - 2 VIEW  Comparison: 08/18/2008  Findings: Heart size appears normal.  No pleural effusion or edema identified.  Plate-like atelectasis is present in both lung bases.  No airspace consolidation.  The visualized osseous structures are unremarkable.  IMPRESSION:  1.  Bibasilar atelectasis  Original Report Authenticated By: Rosealee Albee, M.D.    Medications: I have reviewed the patient's current medications. Scheduled Meds:    . azithromycin  500 mg Intravenous Q24H  . cefTRIAXone (ROCEPHIN)  IV  1 g Intravenous Q24H  . docusate sodium   100 mg Oral BID  . enoxaparin (LOVENOX) injection  30 mg Subcutaneous Q24H  . pantoprazole  40 mg Oral BID AC  . vancomycin  500 mg Intravenous Q24H   Continuous Infusions:    . sodium chloride 75 mL/hr at 06/14/11 1105   PRN Meds:.acetaminophen, acetaminophen, albuterol, ondansetron (ZOFRAN) IV, ondansetron, ranitidine Assessment/Plan: Patient Active Hospital Problem List: Community acquired pneumonia (06/11/2011)   Assessment: Patient clinically improved. D/C Rocephin and start Levaquin.  Hypertension ()   Assessment: Blood pressure back to normal.    ARF (acute renal failure) (06/11/2011)   Assessment:Essentially resolved. D/C IVF.  Hyperthyroidism (06/11/2011)   Assessment: Continue methimazole     CKD (chronic kidney disease), stage III (06/12/2011)   Assessment: Renal function almost back to baseline.   Generalized weakness (06/13/2011)   Assessment: Patient evaluated by PT recommendations noted.  Anticipate D/C in next 24 - 48 hours.  LOS: 3 days

## 2011-06-14 NOTE — Progress Notes (Signed)
Patient is complaining of gout to her right foot.  She is requesting her gout meds from home.  She is also complaining that the med ordered for reflux is not working.  Dr. Ashley Royalty notified.

## 2011-06-15 ENCOUNTER — Inpatient Hospital Stay (HOSPITAL_COMMUNITY): Payer: PRIVATE HEALTH INSURANCE

## 2011-06-15 DIAGNOSIS — N183 Chronic kidney disease, stage 3 (moderate): Secondary | ICD-10-CM

## 2011-06-15 DIAGNOSIS — M25469 Effusion, unspecified knee: Secondary | ICD-10-CM

## 2011-06-15 DIAGNOSIS — D696 Thrombocytopenia, unspecified: Secondary | ICD-10-CM

## 2011-06-15 LAB — CBC
HCT: 27.8 % — ABNORMAL LOW (ref 36.0–46.0)
MCH: 33.8 pg (ref 26.0–34.0)
MCV: 97.9 fL (ref 78.0–100.0)
Platelets: 171 10*3/uL (ref 150–400)
RDW: 13.8 % (ref 11.5–15.5)
WBC: 9.8 10*3/uL (ref 4.0–10.5)

## 2011-06-15 LAB — DIFFERENTIAL
Basophils Absolute: 0 10*3/uL (ref 0.0–0.1)
Eosinophils Absolute: 0 10*3/uL (ref 0.0–0.7)
Eosinophils Relative: 0 % (ref 0–5)
Lymphocytes Relative: 8 % — ABNORMAL LOW (ref 12–46)
Monocytes Absolute: 0.9 10*3/uL (ref 0.1–1.0)

## 2011-06-15 LAB — CULTURE, RESPIRATORY W GRAM STAIN: Culture: NORMAL

## 2011-06-15 LAB — BASIC METABOLIC PANEL
CO2: 18 mEq/L — ABNORMAL LOW (ref 19–32)
Calcium: 10.5 mg/dL (ref 8.4–10.5)
Creatinine, Ser: 1.92 mg/dL — ABNORMAL HIGH (ref 0.50–1.10)
GFR calc non Af Amer: 24 mL/min — ABNORMAL LOW (ref >90)
Glucose, Bld: 110 mg/dL — ABNORMAL HIGH (ref 70–99)

## 2011-06-15 MED ORDER — LIDOCAINE HCL (PF) 1 % IJ SOLN
5.0000 mL | Freq: Once | INTRAMUSCULAR | Status: DC
  Filled 2011-06-15: qty 5

## 2011-06-15 MED ORDER — METHYLPREDNISOLONE ACETATE 40 MG/ML IJ SUSP
40.0000 mg | Freq: Once | INTRAMUSCULAR | Status: DC
  Filled 2011-06-15: qty 1

## 2011-06-15 MED ORDER — HYDROCODONE-ACETAMINOPHEN 5-325 MG PO TABS
1.0000 | ORAL_TABLET | Freq: Two times a day (BID) | ORAL | Status: DC | PRN
  Administered 2011-06-15 (×3): 1 via ORAL
  Filled 2011-06-15 (×3): qty 1

## 2011-06-15 MED ORDER — OXYCODONE HCL 5 MG PO TABS
5.0000 mg | ORAL_TABLET | ORAL | Status: DC | PRN
  Administered 2011-06-15 – 2011-06-18 (×7): 5 mg via ORAL
  Filled 2011-06-15 (×7): qty 1

## 2011-06-15 MED ORDER — MORPHINE SULFATE 2 MG/ML IJ SOLN
1.0000 mg | Freq: Once | INTRAMUSCULAR | Status: AC
  Administered 2011-06-15: 1 mg via INTRAVENOUS
  Filled 2011-06-15: qty 1

## 2011-06-15 NOTE — Progress Notes (Signed)
CARE MANAGEMENT NOTE 06/15/2011  Patient:  [name] Alexander,[name] Alexander   Account Number:  [id number]  Date Initiated:  06/15/2011  Documentation initiated by:  Plummer Matich  Subjective/Objective Assessment:   admitted with dyspnjea and fever poss sepsis     Action/Plan:   lives at home some financial concerns present.   Anticipated DC Date:  06/17/2011   Anticipated DC Plan:  SKILLED NURSING FACILITY  In-house referral  Clinical Social Worker      DC Planning Services  NA      [facility] Choice  NA   Choice offered to / List presented to:  NA   DME arranged  NA      DME agency  NA     HH arranged  NA      HH agency  NA   Status of service:  In process, will continue to follow Medicare Important Message given?  NA - LOS <3 / Initial given by admissions (If response is "NO", the following Medicare IM given date fields will be blank) Date Medicare IM given:   Date Additional Medicare IM given:    Discharge Disposition:    Per UR Regulation:  Reviewed for med. necessity/level of care/duration of stay  If discussed at Long Length of Stay Meetings, dates discussed:    Comments:  [id number] Marcelle Smiling, RN,BSN,CCM No discharge needs present at time of this review Case Management [phone]

## 2011-06-15 NOTE — Progress Notes (Signed)
Subjective: Patient has C/O pain and swelling in left knee today. The pain is a 10/10 at present and came on suddenly today. Pt states that pain present with any movement. Pt again reinforces that she does not want rehab and intends to go home at the time of D/C.   Interval History:   Dr. Chestine Spore reports that Cr. In 03/2011 was 2.2. He also reports that patient has a history of bronchiectasis and osteoarthritis and that she will eventually need joint replacement on the left. Also made him aware of psychosocial concerns.  Objective: Filed Vitals:   06/14/11 2225 06/15/11 0605 06/15/11 1339 06/15/11 1432  BP: 150/83 143/71 164/89   Pulse: 84 89 92 105  Temp: 97.4 F (36.3 C) 97.6 F (36.4 C) 99.7 F (37.6 C)   TempSrc: Oral Oral Oral   Resp: 18 18 20    Height:      Weight:      SpO2: 94% 94% 93% 97%   Weight change:   Intake/Output Summary (Last 24 hours) at 06/15/11 1441 Last data filed at 06/15/11 1407  Gross per 24 hour  Intake    240 ml  Output    453 ml  Net   -213 ml    General: Alert, awake, oriented x3, in no acute distress.  HEENT: [place]/AT PEERL, EOMI Neck: Trachea midline,  no masses, no thyromegal,y no JVD, no carotid bruit OROPHARYNX:  Moist, No exudate/ erythema/lesions.  Heart: Regular rate and rhythm, without murmurs, rubs, gallops, PMI non-displaced, no heaves or thrills on palpation.  Lungs: Basilar rales noted, but no wheezing present. No increased vocal fremitus resonant to percussion  Abdomen: Soft, nontender, nondistended, positive bowel sounds, no masses no hepatosplenomegaly noted..  Neuro: No focal neurological deficits noted cranial nerves II through XII grossly intact. Musculoskeletal: Left knee warm and swollen and with effusion. No erythema noted.   Lab Results:  Basename 06/14/11 0500 06/13/11 0615  NA 139 138  K 3.7 3.7  CL 110 109  CO2 19 17*  GLUCOSE 104* 102*  BUN 33* 36*  CREATININE 2.24* 2.29*  CALCIUM 10.1 10.0  MG -- --  PHOS -- --    No results found for this basename: AST:2,ALT:2,ALKPHOS:2,BILITOT:2,PROT:2,ALBUMIN:2 in the last 72 hours No results found for this basename: LIPASE:2,AMYLASE:2 in the last 72 hours  Basename 06/13/11 0500  WBC 10.3  NEUTROABS --  HGB 9.3*  HCT 28.0*  MCV 99.3  PLT 124*   No results found for this basename: CKTOTAL:3,CKMB:3,CKMBINDEX:3,TROPONINI:3 in the last 72 hours No components found with this basename: POCBNP:3 No results found for this basename: DDIMER:2 in the last 72 hours No results found for this basename: HGBA1C:2 in the last 72 hours No results found for this basename: CHOL:2,HDL:2,LDLCALC:2,TRIG:2,CHOLHDL:2,LDLDIRECT:2 in the last 72 hours No results found for this basename: TSH,T4TOTAL,FREET3,T3FREE,THYROIDAB in the last 72 hours No results found for this basename: VITAMINB12:2,FOLATE:2,FERRITIN:2,TIBC:2,IRON:2,RETICCTPCT:2 in the last 72 hours  Micro Results: Recent Results (from the past 240 hour(s))  CULTURE, BLOOD (ROUTINE X 2)     Status: Normal (Preliminary result)   Collection Time   06/11/11  5:05 PM      Component Value Range Status Comment   Specimen Description BLOOD LEFT ARM   Final    Special Requests BOTTLES DRAWN AEROBIC AND ANAEROBIC   Final    Culture  Setup Time [id number]   Final    Culture     Final    Value:        BLOOD  CULTURE RECEIVED NO GROWTH TO DATE CULTURE WILL BE HELD FOR 5 DAYS BEFORE ISSUING A FINAL NEGATIVE REPORT   Report Status PENDING   Incomplete   URINE CULTURE     Status: Normal   Collection Time   06/11/11  5:48 PM      Component Value Range Status Comment   Specimen Description URINE, CATHETERIZED   Final    Special Requests NONE   Final    Culture  Setup Time [id number]   Final    Colony Count NO GROWTH   Final    Culture NO GROWTH   Final    Report Status 06/13/2011 FINAL   Final   CULTURE, BLOOD (ROUTINE X 2)     Status: Normal (Preliminary result)   Collection Time   06/11/11  6:10 PM      Component Value  Range Status Comment   Specimen Description BLOOD LEFT HAND   Final    Special Requests BOTTLES DRAWN AEROBIC ONLY 2CC   Final    Culture  Setup Time [id number]   Final    Culture     Final    Value:        BLOOD CULTURE RECEIVED NO GROWTH TO DATE CULTURE WILL BE HELD FOR 5 DAYS BEFORE ISSUING A FINAL NEGATIVE REPORT   Report Status PENDING   Incomplete   MRSA PCR SCREENING     Status: Normal   Collection Time   06/12/11 12:16 AM      Component Value Range Status Comment   MRSA by PCR NEGATIVE  NEGATIVE  Final   CULTURE, EXPECTORATED SPUTUM-ASSESSMENT     Status: Normal   Collection Time   06/12/11  3:46 PM      Component Value Range Status Comment   Specimen Description SPUTUM   Final    Special Requests Normal   Final    Sputum evaluation     Final    Value: THIS SPECIMEN IS ACCEPTABLE. RESPIRATORY CULTURE REPORT TO FOLLOW.   Report Status 06/12/2011 FINAL   Final   CULTURE, RESPIRATORY     Status: Normal   Collection Time   06/12/11  3:46 PM      Component Value Range Status Comment   Specimen Description SPUTUM   Final    Special Requests NONE   Final    Gram Stain     Final    Value: ABUNDANT WBC PRESENT, PREDOMINANTLY PMN     RARE SQUAMOUS EPITHELIAL CELLS PRESENT     NO ORGANISMS SEEN   Culture NORMAL OROPHARYNGEAL FLORA   Final    Report Status 06/15/2011 FINAL   Final     Studies/Results: Dg Chest 2 View  06/11/2011  *RADIOLOGY REPORT*  Clinical Data: Pain.  Fever  CHEST - 2 VIEW  Comparison: 08/18/2008  Findings: Heart size appears normal.  No pleural effusion or edema identified.  Plate-like atelectasis is present in both lung bases.  No airspace consolidation.  The visualized osseous structures are unremarkable.  IMPRESSION:  1.  Bibasilar atelectasis  Original Report Authenticated By: Rosealee Albee, M.D.    Medications: I have reviewed the patient's current medications. Scheduled Meds:    . docusate sodium  100 mg Oral BID  . enoxaparin (LOVENOX) injection  30  mg Subcutaneous Q24H  . levofloxacin  250 mg Oral Daily  . levofloxacin  500 mg Oral Once  . pantoprazole  40 mg Oral BID AC  . zolpidem  5 mg Oral Once  Continuous Infusions:   PRN Meds:.acetaminophen, acetaminophen, albuterol, ondansetron (ZOFRAN) IV, ondansetron, oxyCODONE, ranitidine, DISCONTD: HYDROcodone-acetaminophen Assessment/Plan: Patient Active Hospital Problem List: Left Knee Inflammation   Assessment: Will get X-ray knee. May require Ortho consult.  Community acquired pneumonia (06/11/2011)   Assessment: On day 5/7 of Abx. (Vanco and zosyn x 4 days and  Levaquin x 1).  Hypertension ()   Assessment: Blood pressure back to normal.    ARF (acute renal failure) (06/11/2011)   Assessment:Essentially resolved. D/C IVF.  Hyperthyroidism (06/11/2011)   Assessment: Continue methimazole     CKD (chronic kidney disease), stage III (06/12/2011)   Assessment: Renal function almost back to baseline.   Generalized weakness (06/13/2011)   Assessment: Patient evaluated by PT recommendations noted.  Anticipate D/C in next 24 - 48 hours.  LOS: 4 days

## 2011-06-15 NOTE — Consult Note (Signed)
Reason for Consult:Severe left knee pain and swelling Referring Physician: Ashley Royalty  HPI: [name] Alexander is an 78 y.o. female who was admitted with pneumonia with PMH also significant for OA and gouty arthropathy. She is s/p R TKA performed by Dr. Sherlean Foot and has received previous injections in the left knee for arthritic flares. While admitted she has now developed 24 hours of increasing severe knee pain on the left and we were consulted for possible injection.  Past Medical History  Diagnosis Date  . Thyroid disease   . Hypertension   . Gout     Past Surgical History  Procedure Date  . Abdominal hysterectomy   . Total knee arthroplasty     History reviewed. No pertinent family history.  Social History:  reports that she has been smoking Cigarettes.  She has a 5 pack-year smoking history. She has never used smokeless tobacco. She reports that she does not drink alcohol or use illicit drugs.  Allergies: No Known Allergies  Medications: I have reviewed the patient's current medications.  Results for orders placed during the hospital encounter of 06/11/11 (from the past 48 hour(s))  BASIC METABOLIC PANEL     Status: Abnormal   Collection Time   06/14/11  5:00 AM      Component Value Range Comment   Sodium 139  135 - 145 (mEq/L)    Potassium 3.7  3.5 - 5.1 (mEq/L)    Chloride 110  96 - 112 (mEq/L)    CO2 19  19 - 32 (mEq/L)    Glucose, Bld 104 (*) 70 - 99 (mg/dL)    BUN 33 (*) 6 - 23 (mg/dL)    Creatinine, Ser 1.61 (*) 0.50 - 1.10 (mg/dL)    Calcium 09.6  8.4 - 10.5 (mg/dL)    GFR calc non Af Amer 20 (*) >90 (mL/min)    GFR calc Af Amer 23 (*) >90 (mL/min)   CBC     Status: Abnormal   Collection Time   06/15/11  3:05 PM      Component Value Range Comment   WBC 9.8  4.0 - 10.5 (K/uL)    RBC 2.84 (*) 3.87 - 5.11 (MIL/uL)    Hemoglobin 9.6 (*) 12.0 - 15.0 (g/dL)    HCT 04.5 (*) 40.9 - 46.0 (%)    MCV 97.9  78.0 - 100.0 (fL)    MCH 33.8  26.0 - 34.0 (pg)    MCHC 34.5  30.0 - 36.0  (g/dL)    RDW 81.1  91.4 - 78.2 (%)    Platelets 171  150 - 400 (K/uL)   DIFFERENTIAL     Status: Abnormal   Collection Time   06/15/11  3:05 PM      Component Value Range Comment   Neutrophils Relative 83 (*) 43 - 77 (%)    Neutro Abs 8.2 (*) 1.7 - 7.7 (K/uL)    Lymphocytes Relative 8 (*) 12 - 46 (%)    Lymphs Abs 0.8  0.7 - 4.0 (K/uL)    Monocytes Relative 9  3 - 12 (%)    Monocytes Absolute 0.9  0.1 - 1.0 (K/uL)    Eosinophils Relative 0  0 - 5 (%)    Eosinophils Absolute 0.0  0.0 - 0.7 (K/uL)    Basophils Relative 0  0 - 1 (%)    Basophils Absolute 0.0  0.0 - 0.1 (K/uL)   BASIC METABOLIC PANEL     Status: Abnormal   Collection Time   06/15/11  3:05 PM      Component Value Range Comment   Sodium 134 (*) 135 - 145 (mEq/L)    Potassium 3.9  3.5 - 5.1 (mEq/L)    Chloride 104  96 - 112 (mEq/L)    CO2 18 (*) 19 - 32 (mEq/L)    Glucose, Bld 110 (*) 70 - 99 (mg/dL)    BUN 26 (*) 6 - 23 (mg/dL)    Creatinine, Ser 1.19 (*) 0.50 - 1.10 (mg/dL)    Calcium 14.7  8.4 - 10.5 (mg/dL)    GFR calc non Af Amer 24 (*) >90 (mL/min)    GFR calc Af Amer 28 (*) >90 (mL/min)     Dg Knee 1-2 Views Left  06/15/2011  *RADIOLOGY REPORT*  Clinical Data: Pain and swelling.  LEFT KNEE - 1-2 VIEW  Comparison: None.  Findings: The knee is in flexion limiting evaluation on the AP view.  No fracture is identified.  Severe tricompartmental degenerative disease is present.  There is a moderately large joint effusion.  IMPRESSION:  1.  No acute finding. 2.  Severe tricompartmental osteoarthritis and moderately large joint effusion.  Original Report Authenticated By: Bernadene Bell. D'ALESSIO, M.D.    ROS: she has been afebrile   Physical Exam: She is in obvious discomfort and holds her left leg and knee quite guarded. she has a large effusion but no erythema. She is diffusely tender to any palpation and attempted exam or ROM Vitals Temp:  [97.4 F (36.3 C)-99.7 F (37.6 C)] 99.7 F (37.6 C) (06/04 1339) Pulse Rate:   [84-105] 105  (06/04 1432) Resp:  [18-20] 20  (06/04 1339) BP: (143-164)/(71-89) 164/89 mmHg (06/04 1339) SpO2:  [93 %-97 %] 97 % (06/04 1432)  Assessment/Plan: Impression: Acute gouty/arthritic flare Left knee Treatment: Discussed with patient and family and recommend aspiration and injection and they are in agreement.  Procedure: Utilizing a sterile prep a superiorlateral injection of lidocaine was utilized and approximately 100 cc of yellow cloudy joint fluid was aspirated consistent with gout. Afterwards a combination of lidocaine 1% and 40 mg of Depo Medrol was instilled back into the joint and she tolerated this well.  It should take up to week to see full benefits of injection but she should hopefully feel better with the aspiration alone. Recommend ongoing analgesics and ice to knee. Would recommend ongoing management of her knee as an outpatient with Dr. Sherlean Foot as he has a relationship with her.   Call for any further needs.  [facility] for Dr. Francena Hanly 06/15/2011, 8:48 PM

## 2011-06-15 NOTE — Progress Notes (Signed)
PT/OT/ST Cancellation Note  __x_Treatment cancelled today due to medical issues with patient which prohibited therapy-pt experiencing significant pain L knee (arthritis per pt). Requested PT check back another day.   ___ Treatment cancelled today due to patient receiving procedure or test   ___ Treatment cancelled today due to patient's refusal to participate   ___ Treatment cancelled today due to   Signature:  Rebeca Alert, PT [phone]

## 2011-06-16 DIAGNOSIS — M25469 Effusion, unspecified knee: Secondary | ICD-10-CM

## 2011-06-16 DIAGNOSIS — N189 Chronic kidney disease, unspecified: Secondary | ICD-10-CM

## 2011-06-16 DIAGNOSIS — N183 Chronic kidney disease, stage 3 (moderate): Secondary | ICD-10-CM

## 2011-06-16 DIAGNOSIS — D696 Thrombocytopenia, unspecified: Secondary | ICD-10-CM

## 2011-06-16 MED ORDER — ALLOPURINOL 100 MG PO TABS
100.0000 mg | ORAL_TABLET | Freq: Every day | ORAL | Status: DC
  Administered 2011-06-16 – 2011-06-18 (×3): 100 mg via ORAL
  Filled 2011-06-16 (×3): qty 1

## 2011-06-16 MED ORDER — ALLOPURINOL 300 MG PO TABS: 300.0000 mg | ORAL_TABLET | Freq: Every day | ORAL | Status: DC

## 2011-06-16 MED ORDER — LIP MEDEX EX OINT
TOPICAL_OINTMENT | CUTANEOUS | Status: AC
  Administered 2011-06-16: 08:00:00
  Filled 2011-06-16: qty 7

## 2011-06-16 MED ORDER — ENSURE COMPLETE PO LIQD
237.0000 mL | Freq: Two times a day (BID) | ORAL | Status: DC
  Administered 2011-06-17 – 2011-06-18 (×3): 237 mL via ORAL

## 2011-06-16 MED ORDER — TRAMADOL HCL 50 MG PO TABS
25.0000 mg | ORAL_TABLET | Freq: Three times a day (TID) | ORAL | Status: DC
  Administered 2011-06-16 – 2011-06-17 (×4): 25 mg via ORAL
  Filled 2011-06-16 (×5): qty 1

## 2011-06-16 MED ORDER — POLYETHYLENE GLYCOL 3350 17 G PO PACK
17.0000 g | PACK | Freq: Every day | ORAL | Status: DC
  Administered 2011-06-16 – 2011-06-18 (×3): 17 g via ORAL
  Filled 2011-06-16 (×3): qty 1

## 2011-06-16 NOTE — Progress Notes (Signed)
Per Care Coordinator, Lillia Abed, Pt may need SNF.  Spoke with Pt and daughter-in-law, Lanny Hurst.  Pt stating her desire to go home but acknowledges that she needs care.  She stated that her grandson lives with her but that she can't depend on him during the day; he's only home at night.  Answered questions re: SNF process.  Veda to talk with Pt and her (Veda's) husband and family will have a plan by tomorrow.  Asked RNCM, Bjorn Loser, to meet with Pt and family to discuss [facility] options.  CSW to f/u with Pt and family tomorrow.  CSW to continue to follow.  [facility] [place], LCSWA Clinical Social Work [phone]

## 2011-06-16 NOTE — Progress Notes (Signed)
Subjective: Constipated. Left knee swelling and pain is less. Poor appetite  Objective: Vital signs in last 24 hours: Temp:  [98.1 F (36.7 C)-99.7 F (37.6 C)] 98.1 F (36.7 C) (06/05 0610) Pulse Rate:  [91-105] 91  (06/05 0610) Resp:  [18-20] 18  (06/05 0610) BP: (128-166)/(60-89) 128/73 mmHg (06/05 0610) SpO2:  [93 %-97 %] 95 % (06/05 0610) Weight change:  Last BM Date: 06/10/11  Intake/Output from previous day: 06/04 0701 - 06/05 0700 In: 240 [P.O.:240] Out: 450 [Urine:450]     Physical Exam: General: Comfortable, alert, communicative, fully oriented, not short of breath at rest.  HEENT:  Mild clinical pallor, no jaundice, no conjunctival injection or discharge. Hydration appears fair.  NECK:  Supple, JVP not seen, no carotid bruits, no palpable lymphadenopathy, no palpable goiter. CHEST:  Clinically clear to auscultation, no wheezes, no crackles. HEART:  Sounds 1 and 2 heard, normal, regular, no murmurs. ABDOMEN:  Full, soft, non-tender, no palpable organomegaly, no palpable masses, normal bowel sounds. GENITALIA:  Not examined.  LOWER EXTREMITIES:  No pitting edema, palpable peripheral pulses. MUSCULOSKELETAL SYSTEM:  Left knee still tender, with diminished ROM. Still has some effusion, albeit less. Generalized osteoarthritic changes. CENTRAL NERVOUS SYSTEM:  No focal neurologic deficit on gross examination.  Lab Results:  [facility] 06/15/11 1505  WBC 9.8  HGB 9.6*  HCT 27.8*  PLT 171    Basename 06/15/11 1505 06/14/11 0500  NA 134* 139  K 3.9 3.7  CL 104 110  CO2 18* 19  GLUCOSE 110* 104*  BUN 26* 33*  CREATININE 1.92* 2.24*  CALCIUM 10.5 10.1   Recent Results (from the past 240 hour(s))  CULTURE, BLOOD (ROUTINE X 2)     Status: Normal (Preliminary result)   Collection Time   06/11/11  5:05 PM      Component Value Range Status Comment   Specimen Description BLOOD LEFT ARM   Final    Special Requests BOTTLES DRAWN AEROBIC AND ANAEROBIC   Final    Culture  Setup Time [id number]   Final    Culture     Final    Value:        BLOOD CULTURE RECEIVED NO GROWTH TO DATE CULTURE WILL BE HELD FOR 5 DAYS BEFORE ISSUING A FINAL NEGATIVE REPORT   Report Status PENDING   Incomplete   URINE CULTURE     Status: Normal   Collection Time   06/11/11  5:48 PM      Component Value Range Status Comment   Specimen Description URINE, CATHETERIZED   Final    Special Requests NONE   Final    Culture  Setup Time [id number]   Final    Colony Count NO GROWTH   Final    Culture NO GROWTH   Final    Report Status 06/13/2011 FINAL   Final   CULTURE, BLOOD (ROUTINE X 2)     Status: Normal (Preliminary result)   Collection Time   06/11/11  6:10 PM      Component Value Range Status Comment   Specimen Description BLOOD LEFT HAND   Final    Special Requests BOTTLES DRAWN AEROBIC ONLY 2CC   Final    Culture  Setup Time [id number]   Final    Culture     Final    Value:        BLOOD CULTURE RECEIVED NO GROWTH TO DATE CULTURE WILL BE HELD FOR 5 DAYS BEFORE ISSUING A FINAL NEGATIVE REPORT  Report Status PENDING   Incomplete   MRSA PCR SCREENING     Status: Normal   Collection Time   06/12/11 12:16 AM      Component Value Range Status Comment   MRSA by PCR NEGATIVE  NEGATIVE  Final   CULTURE, EXPECTORATED SPUTUM-ASSESSMENT     Status: Normal   Collection Time   06/12/11  3:46 PM      Component Value Range Status Comment   Specimen Description SPUTUM   Final    Special Requests Normal   Final    Sputum evaluation     Final    Value: THIS SPECIMEN IS ACCEPTABLE. RESPIRATORY CULTURE REPORT TO FOLLOW.   Report Status 06/12/2011 FINAL   Final   CULTURE, RESPIRATORY     Status: Normal   Collection Time   06/12/11  3:46 PM      Component Value Range Status Comment   Specimen Description SPUTUM   Final    Special Requests NONE   Final    Gram Stain     Final    Value: ABUNDANT WBC PRESENT, PREDOMINANTLY PMN     RARE SQUAMOUS EPITHELIAL CELLS PRESENT     NO  ORGANISMS SEEN   Culture NORMAL OROPHARYNGEAL FLORA   Final    Report Status 06/15/2011 FINAL   Final      Studies/Results: Dg Knee 1-2 Views Left  06/15/2011  *RADIOLOGY REPORT*  Clinical Data: Pain and swelling.  LEFT KNEE - 1-2 VIEW  Comparison: None.  Findings: The knee is in flexion limiting evaluation on the AP view.  No fracture is identified.  Severe tricompartmental degenerative disease is present.  There is a moderately large joint effusion.  IMPRESSION:  1.  No acute finding. 2.  Severe tricompartmental osteoarthritis and moderately large joint effusion.  Original Report Authenticated By: Bernadene Bell. Maricela Curet, M.D.    Medications: Scheduled Meds:   . docusate sodium  100 mg Oral BID  . enoxaparin (LOVENOX) injection  30 mg Subcutaneous Q24H  . levofloxacin  250 mg Oral Daily  . lidocaine  5 mL Other Once  . lidocaine  5 mL Other Once  . lip balm      . methylPREDNISolone acetate  40 mg Intra-articular Once  .  morphine injection  1 mg Intravenous Once  . pantoprazole  40 mg Oral BID AC   Continuous Infusions:  PRN Meds:.acetaminophen, acetaminophen, albuterol, ondansetron (ZOFRAN) IV, ondansetron, oxyCODONE, ranitidine, DISCONTD: HYDROcodone-acetaminophen  Assessment/Plan: Active Hospital Problem List:   1. Community acquired pneumonia (06/11/2011): Patient presented with weakness, cough, productive of yellow phlegm, fever, as well as chills. CXR demonstrated bibasilar atelectasis. It was felt that this clinical picture was consistent with early CAP, and as patient was unwell, tachypneic, she was managed with broad spectrum antibiotic coverage, including Vancomycin, Rocephin and Azithromycin These were discontinued on 06/15/11. Wcc has since normalized from 15.1 at presentation, to 9.8 today. Patient has remained apyrexial, and now feels considerably better. She is now on Levaquin, day#2 (Day#6 Antibiotics). Plan on a total of 10 days of antibiotic therapy.  2. ARF (acute renal  failure) (06/11/2011): Patient has known CKD 3. She presented with BUN of 49, creatinine 2.57, ie. ARF on CKD, due to poor oral intake, as well as diuretic therapy. . Nephrotoxins were avoided, and she was managed with intravenous fluids. Today, creatinine is back to baseline at 1.92. IV fluids were discontinued on 06/15/11.  3. Hyperthyroidism (06/11/2011): Continued on Methimazole. 4. Acute gouty/arthritic flare Left knee:  Patient had acute-onset left knee pain on 06/14/21. Physical examination revealed significant effusion. She has a known history of OA and gouty arthropathy. X-ray knee showed no acute finding, although there was severe tricompartmental osteoarthritis and moderately large joint effusion. Dr Francena Hanly provided orthopedic consultation, and patient underwent aspiratrion and intraarticular steroid injection on 06/15/11, with amelioration of pain. Patient wil follow up with orthopedic surgeon on DC.  Comment: Will aim DC to ST-SNF.  LOS: 5 days   Rasheema Truluck,CHRISTOPHER 06/16/2011, 12:17 PM

## 2011-06-16 NOTE — Progress Notes (Signed)
INITIAL ADULT NUTRITION ASSESSMENT Date: 06/16/2011   Time: 4:19 PM Reason for Assessment: Consult  ASSESSMENT: Female 78 y.o.  Dx: Community acquired pneumonia  Food/Nutrition Related Hx: Met with pt and daughter-in-law at bedside. She reports pt living with grandson PTA and that there was enough access to food at the house, but grandson might not have been doing enough cooking. Pt reports eating 3 small meals during the day r/t having acid reflux and being told to eat frequently throughout the day. Pt not on any nutritional supplements PTA. Pt denies any problems chewing/swallowing. Pt reports usual weight of 135 pounds. Pt c/o stomach pain that started today which daughter-in-law thinks is r/t pt being constipated with reported BM last Thursday. Pt's intake has been only bites of meals during admission. Pt appears frail with decreased muscle bulk and tone per H&P.   Hx:  Past Medical History  Diagnosis Date  . Thyroid disease   . Hypertension   . Gout    Related Meds:  Scheduled Meds:   . docusate sodium  100 mg Oral BID  . enoxaparin (LOVENOX) injection  30 mg Subcutaneous Q24H  . levofloxacin  250 mg Oral Daily  . lidocaine  5 mL Other Once  . lidocaine  5 mL Other Once  . lip balm      . methylPREDNISolone acetate  40 mg Intra-articular Once  . pantoprazole  40 mg Oral BID AC  . polyethylene glycol  17 g Oral Daily  . traMADol  25 mg Oral TID   Continuous Infusions:  PRN Meds:.acetaminophen, acetaminophen, albuterol, ondansetron (ZOFRAN) IV, ondansetron, oxyCODONE, ranitidine  Ht: 5\' 4"  (162.6 cm)  Wt: 132 lb 0.9 oz (59.9 kg)  Ideal Wt: 120 lb % Ideal Wt: 110  Usual Wt: 135 lb % Usual Wt: 98   Body mass index is 22.67 kg/(m^2).   Labs:  CMP     Component Value Date/Time   NA 134* 06/15/2011 1505   K 3.9 06/15/2011 1505   CL 104 06/15/2011 1505   CO2 18* 06/15/2011 1505   GLUCOSE 110* 06/15/2011 1505   BUN 26* 06/15/2011 1505   CREATININE 1.92* 06/15/2011 1505   CALCIUM 10.5 06/15/2011 1505   PROT 7.6 06/11/2011 1710   ALBUMIN 3.8 06/11/2011 1710   AST 15 06/11/2011 1710   ALT 5 06/11/2011 1710   ALKPHOS 147* 06/11/2011 1710   BILITOT 0.5 06/11/2011 1710   GFRNONAA 24* 06/15/2011 1505   GFRAA 28* 06/15/2011 1505    Intake/Output Summary (Last 24 hours) at 06/16/11 1626 Last data filed at 06/16/11 1300  Gross per 24 hour  Intake    420 ml  Output      0 ml  Net    420 ml   Last BM - 06/10/11   Diet Order: General   IVF:    Estimated Nutritional Needs:   Kcal:1500-1750 Protein:75-90g Fluid:1.5-1.7L  NUTRITION DIAGNOSIS: -Inadequate oral intake (NI-2.1).  Status: Ongoing -Pt meets criteria for severe PCM of social/environmental circumstances AEB <50% energy intake for the past month with severe muscle and fat loss with noted decreased strength and generalized weakness per PT notes  RELATED TO: poor appetite, possible constipation  AS EVIDENCE BY: <25% meal intake, weight loss PTA  MONITORING/EVALUATION(Goals): Pt to consume >50% of meals/supplements  EDUCATION NEEDS: -No education needs identified at this time  INTERVENTION: Vanilla Ensure Complete BID. Hopefully intake will improve once constipation resolves.   Dietitian #: [phone]  DOCUMENTATION CODES Per approved criteria  -Severe  malnutrition in the context of social or environmental circumstances    Marshall Cork 06/16/2011, 4:19 PM

## 2011-06-16 NOTE — Progress Notes (Signed)
Spoke with dtg in law will discuss options with the son tonight. Informed that a decision will be need to be made at that time so that placement can occur  If needed,but the will need either snf placement or home with hhc and care 24/7.
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
## 2011-06-17 DIAGNOSIS — N189 Chronic kidney disease, unspecified: Secondary | ICD-10-CM

## 2011-06-17 DIAGNOSIS — N183 Chronic kidney disease, stage 3 (moderate): Secondary | ICD-10-CM

## 2011-06-17 DIAGNOSIS — M25469 Effusion, unspecified knee: Secondary | ICD-10-CM

## 2011-06-17 DIAGNOSIS — D696 Thrombocytopenia, unspecified: Secondary | ICD-10-CM

## 2011-06-17 LAB — CBC
MCH: 33.1 pg (ref 26.0–34.0)
MCV: 98.3 fL (ref 78.0–100.0)
Platelets: 196 10*3/uL (ref 150–400)
RDW: 14.2 % (ref 11.5–15.5)

## 2011-06-17 LAB — BASIC METABOLIC PANEL
Calcium: 10.8 mg/dL — ABNORMAL HIGH (ref 8.4–10.5)
Creatinine, Ser: 2.94 mg/dL — ABNORMAL HIGH (ref 0.50–1.10)
GFR calc Af Amer: 17 mL/min — ABNORMAL LOW (ref >90)
GFR calc non Af Amer: 15 mL/min — ABNORMAL LOW (ref >90)
Sodium: 134 mEq/L — ABNORMAL LOW (ref 135–145)

## 2011-06-17 MED ORDER — TRAMADOL HCL 50 MG PO TABS
25.0000 mg | ORAL_TABLET | Freq: Four times a day (QID) | ORAL | Status: DC
  Administered 2011-06-17 – 2011-06-18 (×2): 25 mg via ORAL
  Filled 2011-06-17 (×4): qty 1

## 2011-06-17 MED ORDER — SODIUM CHLORIDE 0.9 % IV SOLN
INTRAVENOUS | Status: DC
  Administered 2011-06-17 – 2011-06-18 (×2): via INTRAVENOUS

## 2011-06-17 MED ORDER — METHIMAZOLE 5 MG PO TABS
5.0000 mg | ORAL_TABLET | Freq: Every day | ORAL | Status: DC
  Administered 2011-06-17 – 2011-06-18 (×2): 5 mg via ORAL
  Filled 2011-06-17 (×2): qty 1

## 2011-06-17 NOTE — Discharge Summary (Signed)
Physician Discharge Summary  Patient ID: [name] Alexander MRN: [id number] DOB/AGE: [birth year] 78 y.o.  Admit date: 06/11/2011 Discharge date: 06/18/2011  Primary Care Physician:  [name] Slimmer, MD, MD Primary Orthopedic surgeon: Dr [name] Alexander.  Discharge Diagnoses:    Patient Active Problem List  Diagnoses  . Hypertension  . Gout  . Community acquired pneumonia  . ARF (acute renal failure)  . Hypercalcemia  . Hyperthyroidism  . Glaucoma  . DVT (deep venous thrombosis)  . A-fib  . CKD (chronic kidney disease), stage III    Medication List  As of 06/18/2011 11:44 AM   STOP taking these medications         senna 8.6 MG tablet      valsartan-hydrochlorothiazide 160-12.5 MG per tablet         TAKE these medications         allopurinol 100 MG tablet   Commonly known as: ZYLOPRIM   Take 1 tablet (100 mg total) by mouth daily.      DSS 100 MG Caps   Take 100 mg by mouth 2 (two) times daily.      feeding supplement Liqd   Take 237 mLs by mouth 2 (two) times daily between meals.      levofloxacin 250 MG tablet   Commonly known as: LEVAQUIN   Take 1 tablet (250 mg total) by mouth daily.      methimazole 5 MG tablet   Commonly known as: TAPAZOLE   Take 5 mg by mouth daily.      pantoprazole 40 MG tablet   Commonly known as: PROTONIX   Take 1 tablet (40 mg total) by mouth 2 (two) times daily before a meal.      polyethylene glycol packet   Commonly known as: MIRALAX / GLYCOLAX   Take 17 g by mouth daily.      traMADol 50 MG tablet   Commonly known as: ULTRAM   Take 0.5 tablets (25 mg total) by mouth 4 (four) times daily.      TYLENOL ARTHRITIS PAIN 650 MG CR tablet   Generic drug: acetaminophen   Take 1,300 mg by mouth every 12 (twelve) hours as needed. For pain             Disposition and Follow-up:  Follow up with primary MD and with primary orthopedic surgeon.  Consults:  orthopedic surgery  Dr [name] Alexander.  Significant Diagnostic Studies:  Dg  Chest 2 View  06/11/2011  *RADIOLOGY REPORT*  Clinical Data: Pain.  Fever  CHEST - 2 VIEW  Comparison: 08/18/2008  Findings: Heart size appears normal.  No pleural effusion or edema identified.  Plate-like atelectasis is present in both lung bases.  No airspace consolidation.  The visualized osseous structures are unremarkable.  IMPRESSION:  1.  Bibasilar atelectasis  Original Report Authenticated By: [name] Alexander, M.D.    Brief H and P: For complete details, refer to admission H and P. However, in brief, this is a 78 year old woman with history of hyperthyroidism, gout, tobacco abuse, presenting with complaints of fevers chills, yellow sputum production and shortness of breath. Initial evaluation in the ED, revealed acute renal failure, leukocytosis and audible rhonchi. She was admitted for further evaluation, investigation and management.  Physical Exam: On 06/18/11. General: Comfortable, alert, communicative, fully oriented, not short of breath at rest.  HEENT: Mild clinical pallor, no jaundice, no conjunctival injection or discharge. Hydration appears fair.  NECK: Supple, JVP not seen, no carotid bruits, no palpable lymphadenopathy,  no palpable goiter.  CHEST: Clinically clear to auscultation, no wheezes, no crackles.  HEART: Sounds 1 and 2 heard, normal, regular, no murmurs.  ABDOMEN: Full, soft, non-tender, no palpable organomegaly, no palpable masses, normal bowel sounds.  GENITALIA: Not examined.  LOWER EXTREMITIES: No pitting edema, palpable peripheral pulses.  MUSCULOSKELETAL SYSTEM: Left knee less tender, with diminished ROM. Significantly decreased effusion, albeit less. Generalized osteoarthritic changes.  CENTRAL NERVOUS SYSTEM: No focal neurologic deficit on gross examination.   Hospital Course:  1. Community acquired pneumonia (06/11/2011): Patient presented with weakness, cough, productive of yellow phlegm, fever, as well as chills. CXR demonstrated bibasilar atelectasis. It  was felt that this clinical picture was consistent with early CAP, and as patient was unwell, tachypneic, she was managed with broad spectrum antibiotic coverage, including Vancomycin, Rocephin and Azithromycin These were discontinued on 06/15/11. Wcc has since normalized from 15.1 at presentation, to 9.8 on 06/16/11. Patient has remained apyrexial, and now feels considerably better. As of 06/17/11, pneumonia had clinically resolved. She was transitioned to oral Levaquin on 06/15/11 and total of 10 days of antibiotic therapy is planned, to be concluded on 06/20/11.  2. ARF (acute renal failure) (06/11/2011): Patient has known CKD 3. She presented with BUN of 49, creatinine 2.57, ie. ARF on CKD, due to poor oral intake, as well as diuretic therapy. Nephrotoxins were avoided, and she was managed with intravenous fluids. As of 06/16/11, creatinine was back to baseline at 1.92. IV fluids were discontinued on 06/15/11. Unfortunately, creatinine bumped to 2.94 overnight, due to poor oral fluid intake. IV fluids were recommenced on 06/17/11, to be discontinued on 06/18/11. Oral fluids have been encouraged. Creatinine was 2.86 on 06/18/11. 3. Hyperthyroidism (06/11/2011): Continued on Methimazole.  4. Acute gouty/arthritic flare Left knee:  Patient had acute-onset left knee pain on 06/14/21. Physical examination revealed significant effusion. She has a known history of OA and gouty arthropathy. X-ray knee showed no acute finding, although there was severe tricompartmental osteoarthritis and moderately large joint effusion. Dr [name] Alexander provided orthopedic consultation, and patient underwent aspiratrion and intraarticular steroid injection on 06/15/11, with amelioration of pain. Patient will follow up with orthopedic surgeon on DC.   Comment: Stable for discharge to SNF on 06/18/11.   Time spent on Discharge: 40 mins.  Signed: Zian Alexander,CHRISTOPHER 06/18/2011, 11:44 AM

## 2011-06-17 NOTE — Progress Notes (Signed)
Subjective: Left knee swelling and pain continue to improve.  Objective: Vital signs in last 24 hours: Temp:  [97.9 F (36.6 C)-98.5 F (36.9 C)] 97.9 F (36.6 C) (06/06 0610) Pulse Rate:  [84-108] 84  (06/06 0610) Resp:  [16-18] 16  (06/06 0610) BP: (102-132)/(65-72) 129/72 mmHg (06/06 0610) SpO2:  [93 %-95 %] 93 % (06/06 0610) Weight change:  Last BM Date: 06/10/11  Intake/Output from previous day: 06/05 0701 - 06/06 0700 In: 420 [P.O.:420] Out: -  Total I/O In: 180 [P.O.:180] Out: 200 [Urine:200]   Physical Exam: General: Comfortable, alert, communicative, fully oriented, not short of breath at rest.  HEENT:  Mild clinical pallor, no jaundice, no conjunctival injection or discharge. Hydration appears fair.  NECK:  Supple, JVP not seen, no carotid bruits, no palpable lymphadenopathy, no palpable goiter. CHEST:  Clinically clear to auscultation, no wheezes, no crackles. HEART:  Sounds 1 and 2 heard, normal, regular, no murmurs. ABDOMEN:  Full, soft, non-tender, no palpable organomegaly, no palpable masses, normal bowel sounds. GENITALIA:  Not examined.  LOWER EXTREMITIES:  No pitting edema, palpable peripheral pulses. MUSCULOSKELETAL SYSTEM:  Left knee less tender, with diminished ROM. Significantly decreased effusion, albeit less. Generalized osteoarthritic changes. CENTRAL NERVOUS SYSTEM:  No focal neurologic deficit on gross examination.  Lab Results:  Basename 06/17/11 0500 06/15/11 1505  WBC 11.3* 9.8  HGB 9.6* 9.6*  HCT 28.5* 27.8*  PLT 196 171    Basename 06/17/11 0500 06/15/11 1505  NA 134* 134*  K 4.6 3.9  CL 103 104  CO2 20 18*  GLUCOSE 103* 110*  BUN 49* 26*  CREATININE 2.94* 1.92*  CALCIUM 10.8* 10.5   Recent Results (from the past 240 hour(s))  CULTURE, BLOOD (ROUTINE X 2)     Status: Normal (Preliminary result)   Collection Time   06/11/11  5:05 PM      Component Value Range Status Comment   Specimen Description BLOOD LEFT ARM   Final    Special Requests BOTTLES DRAWN AEROBIC AND ANAEROBIC   Final    Culture  Setup Time [id number]   Final    Culture     Final    Value:        BLOOD CULTURE RECEIVED NO GROWTH TO DATE CULTURE WILL BE HELD FOR 5 DAYS BEFORE ISSUING A FINAL NEGATIVE REPORT   Report Status PENDING   Incomplete   URINE CULTURE     Status: Normal   Collection Time   06/11/11  5:48 PM      Component Value Range Status Comment   Specimen Description URINE, CATHETERIZED   Final    Special Requests NONE   Final    Culture  Setup Time [id number]   Final    Colony Count NO GROWTH   Final    Culture NO GROWTH   Final    Report Status 06/13/2011 FINAL   Final   CULTURE, BLOOD (ROUTINE X 2)     Status: Normal (Preliminary result)   Collection Time   06/11/11  6:10 PM      Component Value Range Status Comment   Specimen Description BLOOD LEFT HAND   Final    Special Requests BOTTLES DRAWN AEROBIC ONLY 2CC   Final    Culture  Setup Time [id number]   Final    Culture     Final    Value:        BLOOD CULTURE RECEIVED NO GROWTH TO DATE CULTURE WILL BE HELD FOR 5  DAYS BEFORE ISSUING A FINAL NEGATIVE REPORT   Report Status PENDING   Incomplete   MRSA PCR SCREENING     Status: Normal   Collection Time   06/12/11 12:16 AM      Component Value Range Status Comment   MRSA by PCR NEGATIVE  NEGATIVE  Final   CULTURE, EXPECTORATED SPUTUM-ASSESSMENT     Status: Normal   Collection Time   06/12/11  3:46 PM      Component Value Range Status Comment   Specimen Description SPUTUM   Final    Special Requests Normal   Final    Sputum evaluation     Final    Value: THIS SPECIMEN IS ACCEPTABLE. RESPIRATORY CULTURE REPORT TO FOLLOW.   Report Status 06/12/2011 FINAL   Final   CULTURE, RESPIRATORY     Status: Normal   Collection Time   06/12/11  3:46 PM      Component Value Range Status Comment   Specimen Description SPUTUM   Final    Special Requests NONE   Final    Gram Stain     Final    Value: ABUNDANT WBC PRESENT,  PREDOMINANTLY PMN     RARE SQUAMOUS EPITHELIAL CELLS PRESENT     NO ORGANISMS SEEN   Culture NORMAL OROPHARYNGEAL FLORA   Final    Report Status 06/15/2011 FINAL   Final      Studies/Results: Dg Knee 1-2 Views Left  06/15/2011  *RADIOLOGY REPORT*  Clinical Data: Pain and swelling.  LEFT KNEE - 1-2 VIEW  Comparison: None.  Findings: The knee is in flexion limiting evaluation on the AP view.  No fracture is identified.  Severe tricompartmental degenerative disease is present.  There is a moderately large joint effusion.  IMPRESSION:  1.  No acute finding. 2.  Severe tricompartmental osteoarthritis and moderately large joint effusion.  Original Report Authenticated By: Bernadene Bell. Maricela Curet, M.D.    Medications: Scheduled Meds:    . allopurinol  100 mg Oral Daily  . docusate sodium  100 mg Oral BID  . enoxaparin (LOVENOX) injection  30 mg Subcutaneous Q24H  . feeding supplement  237 mL Oral BID BM  . levofloxacin  250 mg Oral Daily  . lidocaine  5 mL Other Once  . lidocaine  5 mL Other Once  . methylPREDNISolone acetate  40 mg Intra-articular Once  . pantoprazole  40 mg Oral BID AC  . polyethylene glycol  17 g Oral Daily  . traMADol  25 mg Oral TID  . DISCONTD: allopurinol  300 mg Oral Daily   Continuous Infusions:  PRN Meds:.acetaminophen, acetaminophen, albuterol, ondansetron (ZOFRAN) IV, ondansetron, oxyCODONE, ranitidine  Assessment/Plan: Active Hospital Problem List:   1. Community acquired pneumonia (06/11/2011): Patient presented with weakness, cough, productive of yellow phlegm, fever, as well as chills. CXR demonstrated bibasilar atelectasis. It was felt that this clinical picture was consistent with early CAP, and as patient was unwell, tachypneic, she was managed with broad spectrum antibiotic coverage, including Vancomycin, Rocephin and Azithromycin These were discontinued on 06/15/11. Wcc has since normalized from 15.1 at presentation, to 9.8 on 06/16/11. Patient has remained  apyrexial, and now feels considerably better. She is now on Levaquin, day#3 (Day#7 Antibiotics). Plan on a total of 10 days of antibiotic therapy.  2. ARF (acute renal failure) (06/11/2011): Patient has known CKD 3. She presented with BUN of 49, creatinine 2.57, ie. ARF on CKD, due to poor oral intake, as well as diuretic therapy. . Nephrotoxins were  avoided, and she was managed with intravenous fluids. Today, creatinine was back to baseline at 1.92. IV fluids were discontinued on 06/15/11. Unfortunately, creatinine bumped to 2.94 overnight, due to poor fluid intake. Will rehydrate iv overnight. 3. Hyperthyroidism (06/11/2011): Continued on Methimazole. 4. Acute gouty/arthritic flare Left knee: Patient had acute-onset left knee pain on 06/14/21. Physical examination revealed significant effusion. She has a known history of OA and gouty arthropathy. X-ray knee showed no acute finding, although there was severe tricompartmental osteoarthritis and moderately large joint effusion. Dr Francena Hanly provided orthopedic consultation, and patient underwent aspiratrion and intraarticular steroid injection on 06/15/11, with amelioration of pain. Patient wil follow up with orthopedic surgeon on DC.  Comment: Will [facility] DC [facility] on 06/18/11.   LOS: 6 days   Nakita Santerre,CHRISTOPHER 06/17/2011, 12:23 PM

## 2011-06-17 NOTE — Progress Notes (Signed)
Attempted to meet with Pt and family.  No family present, at this time.  Pt gave CSW permission to fax her information to [facility], in the off-chance that the family chooses SNF as the d/c plan.  Faxed Pt's information via [facility] to [employer].  CSW to continue to follow.  Providence Crosby, LCSWA Clinical Social Work [phone]

## 2011-06-17 NOTE — Progress Notes (Addendum)
Clinical Social Work Department CLINICAL SOCIAL WORK PLACEMENT NOTE 06/17/2011  Patient:  [name] Alexander, [name] Alexander  Account Number:  [id number] Admit date:  06/11/2011  Clinical Social Worker:  Doroteo Glassman  Date/time:  06/17/2011 10:25 AM  Clinical Social Work is seeking post-discharge placement for this patient at the following level of care:   SKILLED NURSING   (*CSW will update this form in Epic as items are completed)   06/16/2011  Patient/family provided with [facility] Department of Clinical Social Work's list of facilities offering this level of care within the geographic area requested by the patient (or if unable, by the patient's family).  06/16/2011  Patient/family informed of their freedom to choose among providers that offer the needed level of care, that participate in Medicare, Medicaid or managed care program needed by the patient, have an available bed and are willing to accept the patient.  06/16/2011  Patient/family informed of [facility]' ownership interest in [facility], as well as of the fact that they are under no obligation to receive care at this facility.  PASARR submitted to EDS on 06/16/2011 PASARR number received from EDS on 06/17/2011  FL2 transmitted to all facilities in geographic area requested by pt/family on  06/17/2011 FL2 transmitted to all facilities within larger geographic area on   Patient informed that his/her managed care company has contracts with or will negotiate with  certain facilities, including the following:     Patient/family informed of bed offers received:  06/18/11 Patient chooses bed at [facility] Physician recommends and patient chooses bed at    Patient to be transferred to  on  [facility] on 06/18/11 Patient to be transferred to facility by [facility]  The following physician request were entered in Epic:   Additional Comments:   CSW to continue to follow.  Providence Crosby, LCSWA Clinical Social  Work [phone]

## 2011-06-18 DIAGNOSIS — M25469 Effusion, unspecified knee: Secondary | ICD-10-CM

## 2011-06-18 DIAGNOSIS — N183 Chronic kidney disease, stage 3 (moderate): Secondary | ICD-10-CM

## 2011-06-18 DIAGNOSIS — D696 Thrombocytopenia, unspecified: Secondary | ICD-10-CM

## 2011-06-18 DIAGNOSIS — N189 Chronic kidney disease, unspecified: Secondary | ICD-10-CM

## 2011-06-18 LAB — CULTURE, BLOOD (ROUTINE X 2)
Culture  Setup Time: 201306010150
Culture: NO GROWTH

## 2011-06-18 LAB — BASIC METABOLIC PANEL
Calcium: 10.4 mg/dL (ref 8.4–10.5)
GFR calc Af Amer: 17 mL/min — ABNORMAL LOW (ref >90)
GFR calc non Af Amer: 15 mL/min — ABNORMAL LOW (ref >90)
Sodium: 134 mEq/L — ABNORMAL LOW (ref 135–145)

## 2011-06-18 MED ORDER — TRAMADOL HCL 50 MG PO TABS: 25.0000 mg | ORAL_TABLET | Freq: Four times a day (QID) | ORAL | Status: AC

## 2011-06-18 MED ORDER — PANTOPRAZOLE SODIUM 40 MG PO TBEC: 40.0000 mg | DELAYED_RELEASE_TABLET | Freq: Two times a day (BID) | ORAL | Status: DC

## 2011-06-18 MED ORDER — POLYETHYLENE GLYCOL 3350 17 G PO PACK: 17.0000 g | PACK | Freq: Every day | ORAL | Status: AC

## 2011-06-18 MED ORDER — ENSURE COMPLETE PO LIQD: 237.0000 mL | Freq: Two times a day (BID) | ORAL | Status: DC

## 2011-06-18 MED ORDER — ALLOPURINOL 100 MG PO TABS: 100.0000 mg | ORAL_TABLET | Freq: Every day | ORAL | Status: DC

## 2011-06-18 MED ORDER — LEVOFLOXACIN 250 MG PO TABS: 250.0000 mg | ORAL_TABLET | Freq: Every day | ORAL | Status: AC

## 2011-06-18 MED ORDER — DSS 100 MG PO CAPS: 100.0000 mg | ORAL_CAPSULE | Freq: Two times a day (BID) | ORAL | Status: AC

## 2011-06-18 NOTE — Progress Notes (Signed)
Physical Therapy Treatment Patient Details Name: Temari Schooler MRN: [id number] DOB: 1933/01/18 Today's Date: 06/18/2011 Time: 1040-1105 PT Time Calculation (min): 25 min  PT Assessment / Plan / Recommendation Comments on Treatment Session   Pt progressing slowly and plans to D/C to [facility] for ST Rehab.    Follow Up Recommendations    Skilled Nursing Facility   Barriers to Discharge        Equipment Recommendations    n/a   Recommendations for Other Services  n/a  Frequency   x3/wk  Plan      Precautions / Restrictions   none  Pertinent Vitals/Pain C/o 9/10 L knee pain despite premedicated 40 min prior    Mobility  Bed Mobility Bed Mobility: Supine to Sit Supine to Sit: 1: +1 Total assist Details for Bed Mobility Assistance: increased time and uses bed pad to swival hips and get B LE on floor Transfers Transfers: Sit to Stand;Stand to Sit Sit to Stand: 1: +2 Total assist;From bed;From chair/3-in-1 Sit to Stand: Patient Percentage: 40% Stand to Sit: 1: +2 Total assist;To chair/3-in-1 Stand to Sit: Patient Percentage: 50% Details for Transfer Assistance: Assisted pt from bed to [facility] to void then stand for hygiene and attempt amb. Ambulation/Gait Ambulation/Gait Assistance: 1: +2 Total assist Ambulation/Gait: Patient Percentage: 30% Ambulation Distance (Feet): 2 Feet Assistive device: Eva walker Ambulation/Gait Assistance Details: 9/10 L knee pain despite pre medication with difficulty weight bearing and advancing R LE. Gait Pattern: Step-to pattern;Decreased stride length;Decreased stance time - left;Trunk flexed (knocked kneed) Stairs: No Wheelchair Mobility Wheelchair Mobility: No     PT Goals                   progressing    Visit Information  Last PT Received On: 06/18/11 Assistance Needed: +2    Subjective Data  Subjective: I want to walk, my butt hurts from this bed Patient Stated Goal: going to Rehab   Cognition    fair+   Balance   poor  End of  Session PT - End of Session Equipment Utilized During Treatment: Gait belt Activity Tolerance: Patient limited by pain;Patient limited by fatigue Patient left: in chair;with call bell/phone within reach;with family/visitor present    Felecia Shelling  PTA [facility]  Acute  Rehab Pager     [phone]

## 2011-06-18 NOTE — Progress Notes (Signed)
Met with Pt's son, [name] [phone], [phone]) and daughter-in-law, Lanny Hurst, outside of Pt's room, as NT assisting Pt on the potty chair.  Pt's son and daughter-in-law wanting Pt to go to SNF and have chosen [facility], as it's close to their home.  Pt thanked family for their time.  Notified facility.  Facility able to accept Pt today.  CSW awaiting medical clearance from MD.  Providence Crosby, LCSWA Clinical Social Work [phone]

## 2011-06-18 NOTE — Progress Notes (Signed)
Patient discharged to [facility].  Report given to [facility].  All belongings sent with the family.  IV in right forearm removed. No further questions at this time.  Patient escorted to lobby via stretcher.  Patient discharged.

## 2011-06-18 NOTE — Progress Notes (Signed)
Per MD, Pt medically ready for d/c.  Notified Pt and family.  Faxed d/c summary to facility.  Confirmed receipt of d/c summary by facility.  Facility ready to receive Pt.  Arranged for transportation.  Pt to be d/c'd.  Providence Crosby, LCSWA Clinical Social Work [phone]

## 2012-06-17 ENCOUNTER — Emergency Department (HOSPITAL_COMMUNITY)
Admission: EM | Admit: 2012-06-17 | Discharge: 2012-06-18 | Disposition: A | Discharge status: Home / Self Care | Payer: PRIVATE HEALTH INSURANCE | Attending: Emergency Medicine | Admitting: Emergency Medicine

## 2012-06-17 ENCOUNTER — Encounter (HOSPITAL_COMMUNITY): Payer: Self-pay | Admitting: *Deleted

## 2012-06-17 DIAGNOSIS — E079 Disorder of thyroid, unspecified: Secondary | ICD-10-CM | POA: Insufficient documentation

## 2012-06-17 DIAGNOSIS — M25552 Pain in left hip: Secondary | ICD-10-CM

## 2012-06-17 DIAGNOSIS — M1990 Unspecified osteoarthritis, unspecified site: Secondary | ICD-10-CM | POA: Insufficient documentation

## 2012-06-17 DIAGNOSIS — Z79899 Other long term (current) drug therapy: Secondary | ICD-10-CM | POA: Insufficient documentation

## 2012-06-17 DIAGNOSIS — M25559 Pain in unspecified hip: Secondary | ICD-10-CM | POA: Insufficient documentation

## 2012-06-17 DIAGNOSIS — M109 Gout, unspecified: Secondary | ICD-10-CM | POA: Insufficient documentation

## 2012-06-17 DIAGNOSIS — F17200 Nicotine dependence, unspecified, uncomplicated: Secondary | ICD-10-CM | POA: Insufficient documentation

## 2012-06-17 DIAGNOSIS — I10 Essential (primary) hypertension: Secondary | ICD-10-CM | POA: Insufficient documentation

## 2012-06-17 NOTE — ED Notes (Signed)
Hip pain started two weeks ago and she had a cortisone injection and it hasn't helped at all and the pain is unbearable no known injury the md thought it may be bursitis

## 2012-06-18 ENCOUNTER — Emergency Department (HOSPITAL_COMMUNITY): Payer: PRIVATE HEALTH INSURANCE

## 2012-06-18 MED ORDER — HYDROCODONE-ACETAMINOPHEN 5-325 MG PO TABS: ORAL_TABLET | ORAL | Status: DC

## 2012-06-18 MED ORDER — TRAMADOL HCL 50 MG PO TABS
50.0000 mg | ORAL_TABLET | Freq: Once | ORAL | Status: AC
  Administered 2012-06-18: 50 mg via ORAL
  Filled 2012-06-18: qty 1

## 2012-06-18 MED ORDER — HYDROMORPHONE HCL PF 1 MG/ML IJ SOLN
1.0000 mg | Freq: Once | INTRAMUSCULAR | Status: AC
  Administered 2012-06-18: 1 mg via INTRAMUSCULAR
  Filled 2012-06-18: qty 1

## 2012-06-18 NOTE — ED Provider Notes (Addendum)
History     CSN: [id number]  Arrival date & time 06/17/12  2145   First MD Initiated Contact with Patient 06/18/12 0016      Chief Complaint  Patient presents with  . Hip Pain    (Consider location/radiation/quality/duration/timing/severity/associated sxs/prior treatment) HPI 79 year old female presents to emergency room complaining of a left hip pain.  Patient reports pain has been ongoing "for some good while".  She was seen by her orthopedist 2 weeks ago, and had a steroid injection.  She was seen again on Friday, and had injection into her left knee.  She reports her left knee is feeling better, however, her hip is unchanged.  Pain with range of motion, laying on left side, and ambulation.  No fevers no chills.  No trauma to the area.  No redness no swelling.  Patient has history of gout and osteoarthritis.  She's been taking Tylenol arthritis without improvement in pain. Past Medical History  Diagnosis Date  . Thyroid disease   . Hypertension   . Gout     Past Surgical History  Procedure Laterality Date  . Abdominal hysterectomy    . Total knee arthroplasty      No family history on file.  History  Substance Use Topics  . Smoking status: Current Every Day Smoker -- 0.25 packs/day for 20 years    Types: Cigarettes  . Smokeless tobacco: Never Used  . Alcohol Use: No    OB History   Grav Para Term Preterm Abortions TAB SAB Ect Mult Living                  Review of Systems  See History of Present Illness; otherwise all other systems are reviewed and negative Allergies  Review of patient's allergies indicates no known allergies.  Home Medications   Current Outpatient Rx  Name  Route  Sig  Dispense  Refill  . acetaminophen (TYLENOL ARTHRITIS PAIN) 650 MG CR tablet   Oral   Take 650 mg by mouth every 12 (twelve) hours as needed for pain. For pain         . allopurinol (ZYLOPRIM) 100 MG tablet   Oral   Take 100 mg by mouth every morning.         .  methimazole (TAPAZOLE) 5 MG tablet   Oral   Take 5 mg by mouth every morning.          Marland Kitchen EXPIRED: pantoprazole (PROTONIX) 40 MG tablet   Oral   Take 1 tablet (40 mg total) by mouth 2 (two) times daily before a meal.   60 tablet   0   . valsartan-hydrochlorothiazide (DIOVAN-HCT) 160-12.5 MG per tablet   Oral   Take 1 tablet by mouth every morning.           BP 148/71  Pulse 88  Temp(Src) 97.5 F (36.4 C) (Oral)  Resp 17  Ht 5\' 4"  (1.626 m)  Wt 136 lb (61.689 kg)  BMI 23.33 kg/m2  SpO2 99%  Physical Exam  Nursing note and vitals reviewed. Constitutional: She is oriented to person, place, and time. She appears well-developed and well-nourished. No distress.  Musculoskeletal: She exhibits tenderness. She exhibits no edema.  Pain with range of motion of the left hip.  No warmth or crepitus.  Patient has crepitus at left knee, but normal range of motion.  Minimal pain  Neurological: She is alert and oriented to person, place, and time.  Skin: Skin is warm and dry. No  rash noted. No erythema. No pallor.    ED Course  Procedures (including critical care time)  Labs Reviewed - No data to display Dg Hip Complete Left  06/18/2012   *RADIOLOGY REPORT*  Clinical Data: Chronic left hip pain.  LEFT HIP - COMPLETE 2+ VIEW  Comparison: None.  Findings: There is mildly asymmetric superior joint space narrowing at the left hip, with associated sclerotic change and mild subcortical cystic change.  Minimal cortical irregularity is noted along the left femoral head, without significant cortical collapse.  More mild joint space narrowing is noted at the right hip, without significant sclerosis.  There is no evidence of fracture or dislocation.  Degenerative change is noted at the lower lumbar spine.  Mild sclerotic change is seen at the sacroiliac joints.  The visualized bowel gas pattern is grossly unremarkable. Scattered vascular calcifications are seen.  IMPRESSION:  1.  Mildly asymmetric  superior joint space narrowing at the left hip, compatible with osteoarthritis.  Underlying sclerotic change and mild subcortical cystic change seen, with minimal cortical irregularity but no significant cortical collapse. 2.  More mild joint space narrowing noted at the right hip; likely facet disease along the lower lumbar spine. 3.  Scattered vascular calcifications seen.   Original Report Authenticated By: Tonia Ghent, M.D.     1. Left hip pain       MDM  79 year old female with persistent left hip pain.  Will check x-rays for possible occult fracture or effusion.  We'll give Ultram and reassess.  3:02 AM No improvement with Ultram, however patient sleeping a more comfortable after Dilaudid IM.  Patient be discharged home with hydrocodone.  She is to follow back up with Dr. Sherlean Foot her orthopedist        Olivia Mackie, MD 06/18/12 1610  Olivia Mackie, MD 06/18/12 9604  Olivia Mackie, MD 06/18/12 [phone]

## 2012-11-25 ENCOUNTER — Encounter (HOSPITAL_COMMUNITY): Payer: Self-pay | Admitting: Emergency Medicine

## 2012-11-25 ENCOUNTER — Emergency Department (HOSPITAL_COMMUNITY): Payer: PRIVATE HEALTH INSURANCE

## 2012-11-25 ENCOUNTER — Emergency Department (HOSPITAL_COMMUNITY)
Admission: EM | Admit: 2012-11-25 | Discharge: 2012-11-25 | Disposition: A | Discharge status: Home / Self Care | Payer: PRIVATE HEALTH INSURANCE | Attending: Emergency Medicine | Admitting: Emergency Medicine

## 2012-11-25 DIAGNOSIS — Z79899 Other long term (current) drug therapy: Secondary | ICD-10-CM | POA: Insufficient documentation

## 2012-11-25 DIAGNOSIS — M129 Arthropathy, unspecified: Secondary | ICD-10-CM | POA: Insufficient documentation

## 2012-11-25 DIAGNOSIS — I10 Essential (primary) hypertension: Secondary | ICD-10-CM | POA: Insufficient documentation

## 2012-11-25 DIAGNOSIS — M25519 Pain in unspecified shoulder: Secondary | ICD-10-CM | POA: Insufficient documentation

## 2012-11-25 DIAGNOSIS — M109 Gout, unspecified: Secondary | ICD-10-CM | POA: Insufficient documentation

## 2012-11-25 DIAGNOSIS — M25511 Pain in right shoulder: Secondary | ICD-10-CM

## 2012-11-25 DIAGNOSIS — R52 Pain, unspecified: Secondary | ICD-10-CM | POA: Insufficient documentation

## 2012-11-25 DIAGNOSIS — G8929 Other chronic pain: Secondary | ICD-10-CM | POA: Insufficient documentation

## 2012-11-25 DIAGNOSIS — G479 Sleep disorder, unspecified: Secondary | ICD-10-CM | POA: Insufficient documentation

## 2012-11-25 DIAGNOSIS — F17200 Nicotine dependence, unspecified, uncomplicated: Secondary | ICD-10-CM | POA: Insufficient documentation

## 2012-11-25 MED ORDER — HYDROCODONE-ACETAMINOPHEN 5-325 MG PO TABS: 1.0000 | ORAL_TABLET | Freq: Four times a day (QID) | ORAL | Status: DC | PRN

## 2012-11-25 NOTE — ED Provider Notes (Signed)
Medical screening examination/treatment/procedure(s) were conducted as a shared visit with non-physician practitioner(s) and myself.  I personally evaluated the patient during the encounter.  Right sided shoulder pain,  Increases with movement.  No erythema or effusion.  Possible rotator cuff syndrome.  At this time there does not appear to be any evidence of an acute emergency medical condition and the patient appears stable for discharge with appropriate outpatient follow up.     Celene Kras, MD 11/25/12 [phone]

## 2012-11-25 NOTE — ED Provider Notes (Signed)
CSN: [id number]     Arrival date & time 11/25/12  1640 History  This chart was scribed for non-physician practitioner working with Celene Kras, MD by Ashley Jacobs, ED scribe. This patient was seen in room WTR8/WTR8 and the patient's care was started at 5:13 PM.   First MD Initiated Contact with Patient 11/25/12 1659     Chief Complaint  Patient presents with  . Shoulder Pain   (Consider location/radiation/quality/duration/timing/severity/associated sxs/prior Treatment) The history is provided by medical records, the patient and a relative. No language interpreter was used.  HPI HPI Comments: [name] Alexander is a 79 y.o. female who presents to the Emergency Department complaining of constant, chronic right shoulder pain that is worse since yesterday. Pt is right handed and sleeps on her right arm. She states sometimes she lifts her arm above her head while sleeping. Pt denies knowing of any injuries that may caused the shoulder pain.She has limited ROM and reports stiffness. Pt is unable to sleep due to pain or able to sit comfortably. She explains the pain was managable until the pain "flared up".  Pt has hx of arthritis, gout, HTN and thyroid disease. Pt is currently taking Tramadol for pain and reports it only relieves the pain for about an hour. Pt denies chest pain and SOB.     Past Medical History  Diagnosis Date  . Thyroid disease   . Hypertension   . Gout    Past Surgical History  Procedure Laterality Date  . Abdominal hysterectomy    . Total knee arthroplasty     History reviewed. No pertinent family history. History  Substance Use Topics  . Smoking status: Current Every Day Smoker -- 0.25 packs/day for 20 years    Types: Cigarettes  . Smokeless tobacco: Never Used  . Alcohol Use: No   OB History   Grav Para Term Preterm Abortions TAB SAB Ect Mult Living                 Review of Systems  Respiratory: Negative for shortness of breath.   Cardiovascular: Negative for  chest pain.  Musculoskeletal: Positive for arthralgias and myalgias.       Right shoulder  Psychiatric/Behavioral: Positive for sleep disturbance.  All other systems reviewed and are negative.    Allergies  Review of patient's allergies indicates no known allergies.  Home Medications   Current Outpatient Rx  Name  Route  Sig  Dispense  Refill  . acetaminophen (TYLENOL ARTHRITIS PAIN) 650 MG CR tablet   Oral   Take 650 mg by mouth every 12 (twelve) hours as needed for pain. For pain         . allopurinol (ZYLOPRIM) 100 MG tablet   Oral   Take 100 mg by mouth every morning.         Marland Kitchen HYDROcodone-acetaminophen (NORCO/VICODIN) 5-325 MG per tablet      Take 1-2 tabs po q 4-6 hours prn pain   20 tablet   0   . methimazole (TAPAZOLE) 5 MG tablet   Oral   Take 5 mg by mouth every morning.          Marland Kitchen omeprazole (PRILOSEC) 40 MG capsule   Oral   Take 1 capsule by mouth 2 (two) times daily.         Marland Kitchen EXPIRED: pantoprazole (PROTONIX) 40 MG tablet   Oral   Take 1 tablet (40 mg total) by mouth 2 (two) times daily before a meal.  60 tablet   0   . valsartan-hydrochlorothiazide (DIOVAN-HCT) 160-12.5 MG per tablet   Oral   Take 1 tablet by mouth every morning.          BP 129/64  Pulse 78  Temp(Src) 97.8 F (36.6 C) (Oral)  Resp 17  SpO2 100% Physical Exam  Nursing note and vitals reviewed. Constitutional: She is oriented to person, place, and time. She appears well-developed and well-nourished.  HENT:  Head: Normocephalic.  Eyes: EOM are normal.  Neck: Normal range of motion.  Cardiovascular: Normal rate.   intact DP, brisk capillary refill, sensation intact  Pulmonary/Chest: Effort normal.  Abdominal: She exhibits no distension.  Musculoskeletal: Normal range of motion. She exhibits tenderness.  Right shoulder tender to palpation ROM and strength secondary limited to pain positive Hawkins-Kennedy      Neurological: She is alert and oriented to  person, place, and time.  Psychiatric: She has a normal mood and affect.    ED Course  Procedures (including critical care time) DIAGNOSTIC STUDIES: Oxygen Saturation is 100% on room air, normal by my interpretation.    COORDINATION OF CARE: 5:18 PM Discussed course of care with pt. Pt understands and agrees. 5:26 PM Pt was seen by Dr. Lynelle Doctor and will obtain right shoulder X-Ray Dg Shoulder Right  11/25/2012   CLINICAL DATA:  Pain without acute trauma.  EXAM: RIGHT SHOULDER - 2+ VIEW  COMPARISON:  None.  FINDINGS: Degenerate irregularity of the undersurface of the acromioclavicular joint and rotator cuff insertion. No acute fracture or dislocation. Visualized portion of the right hemithorax is normal.  IMPRESSION: Degenerative change, without acute osseous finding.   Electronically Signed   By: Jeronimo Greaves M.D.   On: 11/25/2012 17:54      EKG Interpretation   None       MDM   1. Shoulder pain, acute, right    Patient with worsening shoulder pain. She denies any mechanism of injury. She states that tramadol is no longer working. States it hurts to raise her arm. Plain films negative. Patient seen by and discussed with Dr. Lynelle Doctor. Give the patient a couple of Vicodin, and recommend primary care/orthopedic followup. Patient understands and agrees to plan. She is stable and a for discharge.  I personally performed the services described in this documentation, which was scribed in my presence. The recorded information has been reviewed and is accurate.     Roxy Horseman, PA-C 11/25/12 1800

## 2012-11-25 NOTE — ED Notes (Signed)
She c/o chronic pain of right shoulder, which became very much worse yesterday.  She has also noted her shoulder to be "stiff".  She denies any shortness of breath and specifically denies any chest area pain and is in no distress.

## 2012-11-25 NOTE — ED Notes (Signed)
Pt states she has had right shoulder pain x2 days, worse with movement. Pt can abduct and adduct right arm to left shoulder, but not without pain.

## 2012-11-27 ENCOUNTER — Other Ambulatory Visit: Payer: Self-pay | Admitting: Gynecology

## 2012-11-27 ENCOUNTER — Other Ambulatory Visit: Payer: Self-pay | Admitting: Internal Medicine

## 2012-11-27 DIAGNOSIS — F17200 Nicotine dependence, unspecified, uncomplicated: Secondary | ICD-10-CM

## 2012-11-27 DIAGNOSIS — R634 Abnormal weight loss: Secondary | ICD-10-CM

## 2012-11-27 DIAGNOSIS — R630 Anorexia: Secondary | ICD-10-CM

## 2012-11-30 ENCOUNTER — Ambulatory Visit (HOSPITAL_COMMUNITY)
Admission: RE | Admit: 2012-11-30 | Discharge: 2012-11-30 | Disposition: A | Discharge status: Home / Self Care | Payer: PRIVATE HEALTH INSURANCE | Source: Ambulatory Visit | Attending: Internal Medicine | Admitting: Internal Medicine

## 2012-11-30 DIAGNOSIS — Z87891 Personal history of nicotine dependence: Secondary | ICD-10-CM | POA: Diagnosis not present

## 2012-11-30 DIAGNOSIS — I700 Atherosclerosis of aorta: Secondary | ICD-10-CM | POA: Diagnosis not present

## 2012-11-30 DIAGNOSIS — R630 Anorexia: Secondary | ICD-10-CM | POA: Diagnosis not present

## 2012-11-30 DIAGNOSIS — I2510 Atherosclerotic heart disease of native coronary artery without angina pectoris: Secondary | ICD-10-CM | POA: Insufficient documentation

## 2012-11-30 DIAGNOSIS — N83209 Unspecified ovarian cyst, unspecified side: Secondary | ICD-10-CM | POA: Insufficient documentation

## 2012-11-30 DIAGNOSIS — R634 Abnormal weight loss: Secondary | ICD-10-CM

## 2012-11-30 DIAGNOSIS — K8020 Calculus of gallbladder without cholecystitis without obstruction: Secondary | ICD-10-CM | POA: Insufficient documentation

## 2012-11-30 DIAGNOSIS — N289 Disorder of kidney and ureter, unspecified: Secondary | ICD-10-CM | POA: Insufficient documentation

## 2012-11-30 DIAGNOSIS — I708 Atherosclerosis of other arteries: Secondary | ICD-10-CM | POA: Diagnosis not present

## 2012-11-30 DIAGNOSIS — F17200 Nicotine dependence, unspecified, uncomplicated: Secondary | ICD-10-CM

## 2013-04-23 ENCOUNTER — Ambulatory Visit: Payer: PRIVATE HEALTH INSURANCE

## 2013-04-23 ENCOUNTER — Emergency Department (HOSPITAL_COMMUNITY)
Admission: EM | Admit: 2013-04-23 | Discharge: 2013-04-23 | Disposition: A | Discharge status: Home / Self Care | Payer: PRIVATE HEALTH INSURANCE | Attending: Emergency Medicine | Admitting: Emergency Medicine

## 2013-04-23 ENCOUNTER — Emergency Department (HOSPITAL_COMMUNITY): Payer: PRIVATE HEALTH INSURANCE

## 2013-04-23 ENCOUNTER — Other Ambulatory Visit: Payer: Self-pay

## 2013-04-23 ENCOUNTER — Encounter (HOSPITAL_COMMUNITY): Payer: Self-pay | Admitting: Emergency Medicine

## 2013-04-23 DIAGNOSIS — Z8701 Personal history of pneumonia (recurrent): Secondary | ICD-10-CM | POA: Insufficient documentation

## 2013-04-23 DIAGNOSIS — J159 Unspecified bacterial pneumonia: Secondary | ICD-10-CM | POA: Insufficient documentation

## 2013-04-23 DIAGNOSIS — M129 Arthropathy, unspecified: Secondary | ICD-10-CM | POA: Insufficient documentation

## 2013-04-23 DIAGNOSIS — M109 Gout, unspecified: Secondary | ICD-10-CM | POA: Insufficient documentation

## 2013-04-23 DIAGNOSIS — Z79899 Other long term (current) drug therapy: Secondary | ICD-10-CM | POA: Insufficient documentation

## 2013-04-23 DIAGNOSIS — N289 Disorder of kidney and ureter, unspecified: Secondary | ICD-10-CM

## 2013-04-23 DIAGNOSIS — R42 Dizziness and giddiness: Secondary | ICD-10-CM | POA: Insufficient documentation

## 2013-04-23 DIAGNOSIS — J189 Pneumonia, unspecified organism: Secondary | ICD-10-CM

## 2013-04-23 DIAGNOSIS — I10 Essential (primary) hypertension: Secondary | ICD-10-CM | POA: Insufficient documentation

## 2013-04-23 DIAGNOSIS — F17200 Nicotine dependence, unspecified, uncomplicated: Secondary | ICD-10-CM | POA: Insufficient documentation

## 2013-04-23 HISTORY — DX: Unspecified osteoarthritis, unspecified site: M19.90

## 2013-04-23 HISTORY — DX: Pneumonia, unspecified organism: J18.9

## 2013-04-23 LAB — COMPREHENSIVE METABOLIC PANEL
ALBUMIN: 3.6 g/dL (ref 3.5–5.2)
ALK PHOS: 128 U/L — AB (ref 39–117)
AST: 12 U/L (ref 0–37)
BILIRUBIN TOTAL: 0.4 mg/dL (ref 0.3–1.2)
BUN: 45 mg/dL — ABNORMAL HIGH (ref 6–23)
CHLORIDE: 104 meq/L (ref 96–112)
CO2: 18 meq/L — AB (ref 19–32)
CREATININE: 3.17 mg/dL — AB (ref 0.50–1.10)
Calcium: 11 mg/dL — ABNORMAL HIGH (ref 8.4–10.5)
GFR calc Af Amer: 15 mL/min — ABNORMAL LOW (ref >90)
GFR, EST NON AFRICAN AMERICAN: 13 mL/min — AB (ref >90)
Glucose, Bld: 109 mg/dL — ABNORMAL HIGH (ref 70–99)
POTASSIUM: 4.4 meq/L (ref 3.7–5.3)
SODIUM: 137 meq/L (ref 137–147)
Total Protein: 7.3 g/dL (ref 6.0–8.3)

## 2013-04-23 LAB — CBC WITH DIFFERENTIAL/PLATELET
Basophils Absolute: 0 K/uL (ref 0.0–0.1)
Basophils Relative: 0 % (ref 0–1)
Eosinophils Absolute: 0 K/uL (ref 0.0–0.7)
Eosinophils Relative: 0 % (ref 0–5)
HCT: 26.2 % — ABNORMAL LOW (ref 36.0–46.0)
Hemoglobin: 8.6 g/dL — ABNORMAL LOW (ref 12.0–15.0)
Lymphocytes Relative: 16 % (ref 12–46)
Lymphs Abs: 1.4 K/uL (ref 0.7–4.0)
MCH: 31.3 pg (ref 26.0–34.0)
MCHC: 32.8 g/dL (ref 30.0–36.0)
MCV: 95.3 fL (ref 78.0–100.0)
Monocytes Absolute: 0.5 K/uL (ref 0.1–1.0)
Monocytes Relative: 6 % (ref 3–12)
Neutro Abs: 6.7 K/uL (ref 1.7–7.7)
Neutrophils Relative %: 78 % — ABNORMAL HIGH (ref 43–77)
Platelets: 158 K/uL (ref 150–400)
RBC: 2.75 MIL/uL — ABNORMAL LOW (ref 3.87–5.11)
RDW: 15.2 % (ref 11.5–15.5)
WBC: 8.7 K/uL (ref 4.0–10.5)

## 2013-04-23 MED ORDER — SODIUM CHLORIDE 0.9 % IV BOLUS (SEPSIS)
500.0000 mL | Freq: Once | INTRAVENOUS | Status: AC
  Administered 2013-04-23: 500 mL via INTRAVENOUS

## 2013-04-23 MED ORDER — AMOXICILLIN-POT CLAVULANATE 875-125 MG PO TABS: 1.0000 | ORAL_TABLET | Freq: Two times a day (BID) | ORAL | Status: DC

## 2013-04-23 NOTE — ED Provider Notes (Signed)
CSN: [id number]     Arrival date & time 04/23/13  1546 History   First MD Initiated Contact with Patient 04/23/13 1739     Chief Complaint  Patient presents with  . Sore Throat  . Cough  . Chills     (Consider location/radiation/quality/duration/timing/severity/associated sxs/prior Treatment) Patient is a 80 y.o. female presenting with pharyngitis and cough. The history is provided by the patient.  Sore Throat This is a new problem. Pertinent negatives include no chest pain, no abdominal pain, no headaches and no shortness of breath.  Cough Associated symptoms: sore throat   Associated symptoms: no chest pain, no headaches, no rash and no shortness of breath    patient has had fatigue sore throat and cough for the last 3 days. She's been feeling better for a few days before that also. She's had decreased appetite. She's had some chills. She has had sputum that looks like "infection" per family member. No difficulty breathing. She states she feels cold all over.  Past Medical History  Diagnosis Date  . Thyroid disease   . Hypertension   . Gout   . Pneumonia   . Arthritis    Past Surgical History  Procedure Laterality Date  . Abdominal hysterectomy    . Total knee arthroplasty     No family history on file. History  Substance Use Topics  . Smoking status: Current Every Day Smoker -- 0.25 packs/day for 20 years    Types: Cigarettes  . Smokeless tobacco: Never Used  . Alcohol Use: No   OB History   Grav Para Term Preterm Abortions TAB SAB Ect Mult Living                 Review of Systems  Constitutional: Positive for appetite change. Negative for activity change.  HENT: Positive for sore throat. Negative for nosebleeds and trouble swallowing.   Eyes: Negative for pain.  Respiratory: Positive for cough. Negative for chest tightness and shortness of breath.   Cardiovascular: Negative for chest pain and leg swelling.  Gastrointestinal: Negative for nausea, vomiting,  abdominal pain, diarrhea and blood in stool.  Genitourinary: Negative for flank pain.  Musculoskeletal: Negative for back pain and neck stiffness.  Skin: Negative for pallor and rash.  Neurological: Positive for light-headedness. Negative for weakness, numbness and headaches.  Psychiatric/Behavioral: Negative for behavioral problems.      Allergies  Review of patient's allergies indicates no known allergies.  Home Medications   Current Outpatient Rx  Name  Route  Sig  Dispense  Refill  . allopurinol (ZYLOPRIM) 100 MG tablet   Oral   Take 100 mg by mouth every morning.         Marland Kitchen. HYDROcodone-acetaminophen (NORCO) 10-325 MG per tablet   Oral   Take 1 tablet by mouth every 6 (six) hours as needed for severe pain.         . methimazole (TAPAZOLE) 5 MG tablet   Oral   Take 5 mg by mouth every morning.          Marland Kitchen. omeprazole (PRILOSEC) 40 MG capsule   Oral   Take 1 capsule by mouth 2 (two) times daily.         . traMADol (ULTRAM) 50 MG tablet   Oral   Take 50-100 mg by mouth every 6 (six) hours as needed for moderate pain.          . valsartan-hydrochlorothiazide (DIOVAN-HCT) 160-12.5 MG per tablet   Oral   Take 1  tablet by mouth every morning.         Marland Kitchen. amoxicillin-clavulanate (AUGMENTIN) 875-125 MG per tablet   Oral   Take 1 tablet by mouth every 12 (twelve) hours.   14 tablet   0    BP 134/63  Pulse 69  Temp(Src) 97.9 F (36.6 C) (Oral)  Resp 16  Ht 5\' 4"  (1.626 m)  Wt 110 lb (49.896 kg)  BMI 18.87 kg/m2  SpO2 99% Physical Exam  Nursing note and vitals reviewed. Constitutional: She is oriented to person, place, and time. She appears well-developed and well-nourished.  HENT:  Head: Normocephalic and atraumatic.  Eyes: Pupils are equal, round, and reactive to light.  Mild disconjugate gaze, chronic per patient.  Neck: Normal range of motion. Neck supple.  Cardiovascular: Normal rate, regular rhythm and normal heart sounds.   No murmur  heard. Pulmonary/Chest: Effort normal and breath sounds normal. No respiratory distress. She has no wheezes. She has no rales.  Abdominal: Soft. Bowel sounds are normal. She exhibits no distension. There is no tenderness. There is no rebound and no guarding.  Musculoskeletal: Normal range of motion.  Neurological: She is alert and oriented to person, place, and time. No cranial nerve deficit.  Skin: Skin is warm and dry.  Psychiatric: She has a normal mood and affect. Her speech is normal.    ED Course  Procedures (including critical care time) Labs Review Labs Reviewed  COMPREHENSIVE METABOLIC PANEL - Abnormal; Notable for the following:    CO2 18 (*)    Glucose, Bld 109 (*)    BUN 45 (*)    Creatinine, Ser 3.17 (*)    Calcium 11.0 (*)    Alkaline Phosphatase 128 (*)    GFR calc non Af Amer 13 (*)    GFR calc Af Amer 15 (*)    All other components within normal limits  CBC WITH DIFFERENTIAL - Abnormal; Notable for the following:    RBC 2.75 (*)    Hemoglobin 8.6 (*)    HCT 26.2 (*)    Neutrophils Relative % 78 (*)    All other components within normal limits   Imaging Review Dg Chest 2 View  04/23/2013   CLINICAL DATA:  Cough, sore throat  EXAM: CHEST  2 VIEW  COMPARISON:  11/30/2012  FINDINGS: Mild hyperinflation. Atherosclerotic calcifications of thoracic aorta. Cardiomediastinal silhouette is stable. No acute infiltrate or pulmonary edema. Mild degenerative changes thoracic spine.  IMPRESSION: No active disease.  Mild hyperinflation.   Electronically Signed   By: Natasha MeadLiviu  Pop M.D.   On: 04/23/2013 17:00     EKG Interpretation None      MDM   Final diagnoses:  Community acquired pneumonia  Renal insufficiency    Patient with sore throat productive cough and generalized weakness. Has felt bad for last few days. Has renal sufficiency that may worsen over the last 2 years. Also bicarbonate is somewhat low. Patient does not want to be admitted to hospital. Vitals overall  reassuring. She feels better after some IV fluids and his tolerate orals. We'll treat with antibiotics due to comorbidities and previous history of acquired pneumonia that presented somewhat the same. Patient will followup with her primary care doctor in the next couple days. She continues to worsen she will return to the ER will likely require admission to the hospital.    Juliet RudeNathan R. Rubin PayorPickering, MD 04/23/13 [phone]

## 2013-04-23 NOTE — ED Notes (Addendum)
Pt c/o of cough with yellow sputum, chills and sore throat since Saturday. "been in been all day yesterday"  Denies N/V/D and fever. Denies urinary problems. Pt states generalized weakness and poor appetite.

## 2013-04-23 NOTE — ED Notes (Signed)
Family requesting lab results for patient to give to PCP [facility]reston [facility] Family informed it is not permitted to print out lab results. Referred to Medical Records.

## 2013-04-23 NOTE — Discharge Instructions (Signed)

## 2013-04-24 ENCOUNTER — Other Ambulatory Visit: Payer: Self-pay | Admitting: Internal Medicine

## 2013-04-24 DIAGNOSIS — N19 Unspecified kidney failure: Secondary | ICD-10-CM

## 2013-04-24 DIAGNOSIS — R634 Abnormal weight loss: Secondary | ICD-10-CM

## 2013-04-26 ENCOUNTER — Ambulatory Visit (HOSPITAL_COMMUNITY)
Admission: RE | Admit: 2013-04-26 | Discharge: 2013-04-26 | Disposition: A | Discharge status: Home / Self Care | Payer: PRIVATE HEALTH INSURANCE | Source: Ambulatory Visit | Attending: Internal Medicine | Admitting: Internal Medicine

## 2013-04-26 DIAGNOSIS — R634 Abnormal weight loss: Secondary | ICD-10-CM | POA: Insufficient documentation

## 2013-04-26 DIAGNOSIS — N19 Unspecified kidney failure: Secondary | ICD-10-CM | POA: Insufficient documentation

## 2013-04-27 ENCOUNTER — Other Ambulatory Visit: Payer: Self-pay | Admitting: Internal Medicine

## 2013-04-27 DIAGNOSIS — R935 Abnormal findings on diagnostic imaging of other abdominal regions, including retroperitoneum: Secondary | ICD-10-CM

## 2013-04-30 ENCOUNTER — Ambulatory Visit (HOSPITAL_COMMUNITY)
Admission: RE | Admit: 2013-04-30 | Discharge: 2013-04-30 | Disposition: A | Discharge status: Home / Self Care | Payer: PRIVATE HEALTH INSURANCE | Source: Ambulatory Visit | Attending: Internal Medicine | Admitting: Internal Medicine

## 2013-04-30 DIAGNOSIS — R935 Abnormal findings on diagnostic imaging of other abdominal regions, including retroperitoneum: Secondary | ICD-10-CM | POA: Insufficient documentation

## 2013-12-11 ENCOUNTER — Inpatient Hospital Stay (HOSPITAL_COMMUNITY)
Admission: EM | Admit: 2013-12-11 | Discharge: 2013-12-18 | DRG: 300 | Disposition: A | Discharge status: Home / Self Care | Payer: PRIVATE HEALTH INSURANCE | Attending: Internal Medicine | Admitting: Internal Medicine

## 2013-12-11 ENCOUNTER — Encounter (HOSPITAL_COMMUNITY): Payer: Self-pay

## 2013-12-11 DIAGNOSIS — N184 Chronic kidney disease, stage 4 (severe): Secondary | ICD-10-CM | POA: Diagnosis present

## 2013-12-11 DIAGNOSIS — E039 Hypothyroidism, unspecified: Secondary | ICD-10-CM | POA: Diagnosis present

## 2013-12-11 DIAGNOSIS — I129 Hypertensive chronic kidney disease with stage 1 through stage 4 chronic kidney disease, or unspecified chronic kidney disease: Secondary | ICD-10-CM | POA: Diagnosis present

## 2013-12-11 DIAGNOSIS — K219 Gastro-esophageal reflux disease without esophagitis: Secondary | ICD-10-CM | POA: Diagnosis present

## 2013-12-11 DIAGNOSIS — K5900 Constipation, unspecified: Secondary | ICD-10-CM | POA: Diagnosis present

## 2013-12-11 DIAGNOSIS — I10 Essential (primary) hypertension: Secondary | ICD-10-CM | POA: Diagnosis present

## 2013-12-11 DIAGNOSIS — E0590 Thyrotoxicosis, unspecified without thyrotoxic crisis or storm: Secondary | ICD-10-CM | POA: Diagnosis present

## 2013-12-11 DIAGNOSIS — D649 Anemia, unspecified: Secondary | ICD-10-CM | POA: Diagnosis present

## 2013-12-11 DIAGNOSIS — I82403 Acute embolism and thrombosis of unspecified deep veins of lower extremity, bilateral: Secondary | ICD-10-CM | POA: Diagnosis not present

## 2013-12-11 DIAGNOSIS — M1A9XX0 Chronic gout, unspecified, without tophus (tophi): Secondary | ICD-10-CM | POA: Diagnosis present

## 2013-12-11 DIAGNOSIS — Z79891 Long term (current) use of opiate analgesic: Secondary | ICD-10-CM

## 2013-12-11 DIAGNOSIS — Z90710 Acquired absence of both cervix and uterus: Secondary | ICD-10-CM

## 2013-12-11 DIAGNOSIS — R609 Edema, unspecified: Secondary | ICD-10-CM | POA: Diagnosis present

## 2013-12-11 DIAGNOSIS — R0602 Shortness of breath: Secondary | ICD-10-CM

## 2013-12-11 DIAGNOSIS — R634 Abnormal weight loss: Secondary | ICD-10-CM

## 2013-12-11 DIAGNOSIS — M109 Gout, unspecified: Secondary | ICD-10-CM | POA: Diagnosis present

## 2013-12-11 DIAGNOSIS — I5030 Unspecified diastolic (congestive) heart failure: Secondary | ICD-10-CM | POA: Diagnosis present

## 2013-12-11 DIAGNOSIS — F17210 Nicotine dependence, cigarettes, uncomplicated: Secondary | ICD-10-CM | POA: Diagnosis present

## 2013-12-11 DIAGNOSIS — Z79899 Other long term (current) drug therapy: Secondary | ICD-10-CM

## 2013-12-11 DIAGNOSIS — M1990 Unspecified osteoarthritis, unspecified site: Secondary | ICD-10-CM | POA: Diagnosis present

## 2013-12-11 DIAGNOSIS — D638 Anemia in other chronic diseases classified elsewhere: Secondary | ICD-10-CM | POA: Diagnosis present

## 2013-12-11 MED ORDER — FUROSEMIDE 10 MG/ML IJ SOLN
40.0000 mg | Freq: Once | INTRAMUSCULAR | Status: AC
  Administered 2013-12-12: 40 mg via INTRAVENOUS
  Filled 2013-12-11: qty 4

## 2013-12-11 MED ORDER — SODIUM CHLORIDE 0.9 % IV SOLN
INTRAVENOUS | Status: DC
  Administered 2013-12-12: via INTRAVENOUS

## 2013-12-11 NOTE — ED Provider Notes (Signed)
CSN: [id number]     Arrival date & time 12/11/13  1959 History  This chart was scribed for Toy BakerAnthony T Darnel Mchan, MD by Karle PlumberJennifer Tensley, ED Scribe. This patient was seen in room B18C/B18C and the patient's care was started at 11:53 PM.   Chief Complaint  Patient presents with  . Leg Swelling   The history is provided by the patient. No language interpreter was used.    HPI Comments:  [name] Alexander is a 80 y.o. female who presents to the Emergency Department complaining of bilateral leg swelling that began approximately 2 weeks ago. She states she was seen by her PCP approximately 3 weeks ago and was normal. She states the swelling has gotten worse and is making it hard for her to stand. She reports associated cough. She reports having similar symptoms in the past that has been relieved with fluid pills. Denies CP, SOB, fever, chills. PMH of HTN, gout, thyroid disease and pneumonia.   Past Medical History  Diagnosis Date  . Thyroid disease   . Hypertension   . Gout   . Pneumonia   . Arthritis    Past Surgical History  Procedure Laterality Date  . Abdominal hysterectomy    . Total knee arthroplasty     History reviewed. No pertinent family history. History  Substance Use Topics  . Smoking status: Current Every Day Smoker -- 0.25 packs/day for 20 years    Types: Cigarettes  . Smokeless tobacco: Never Used  . Alcohol Use: No   OB History    No data available     Review of Systems  Constitutional: Negative for fever and chills.  Respiratory: Positive for cough. Negative for shortness of breath.   Cardiovascular: Positive for leg swelling. Negative for chest pain.  Gastrointestinal: Negative for nausea and vomiting.  All other systems reviewed and are negative.   Allergies  Review of patient's allergies indicates no known allergies.  Home Medications   Prior to Admission medications   Medication Sig Start Date End Date Taking? Authorizing Provider  allopurinol (ZYLOPRIM) 100 MG  tablet Take 100 mg by mouth every morning.   Yes Historical Provider, MD  amLODipine (NORVASC) 5 MG tablet Take 5 mg by mouth daily.   Yes Historical Provider, MD  hydrochlorothiazide (MICROZIDE) 12.5 MG capsule Take 12.5 mg by mouth daily.   Yes Historical Provider, MD  HYDROcodone-acetaminophen (NORCO) 10-325 MG per tablet Take 1 tablet by mouth every 6 (six) hours as needed for severe pain.   Yes Historical Provider, MD  methimazole (TAPAZOLE) 5 MG tablet Take 5 mg by mouth every morning.    Yes Historical Provider, MD  omeprazole (PRILOSEC) 40 MG capsule Take 1 capsule by mouth 2 (two) times daily. 11/04/12  Yes Historical Provider, MD  amoxicillin-clavulanate (AUGMENTIN) 875-125 MG per tablet Take 1 tablet by mouth every 12 (twelve) hours. Patient not taking: Reported on 12/11/2013 04/23/13   Juliet RudeNathan R. Pickering, MD  traMADol (ULTRAM) 50 MG tablet Take 50-100 mg by mouth every 6 (six) hours as needed for moderate pain.  10/31/12   Historical Provider, MD  valsartan-hydrochlorothiazide (DIOVAN-HCT) 160-12.5 MG per tablet Take 1 tablet by mouth every morning.    Historical Provider, MD   Triage Vitals: BP 118/87 mmHg  Pulse 83  Temp(Src) 98.3 F (36.8 C) (Oral)  Resp 16  SpO2 99% Physical Exam  Constitutional: She is oriented to person, place, and time. She appears well-developed and well-nourished.  Non-toxic appearance. No distress.  HENT:  Head: Normocephalic  and atraumatic.  Eyes: Conjunctivae, EOM and lids are normal. Pupils are equal, round, and reactive to light.  Neck: Normal range of motion. Neck supple. No tracheal deviation present. No thyroid mass present.  Cardiovascular: Normal rate, regular rhythm and normal heart sounds.  Exam reveals no gallop.   No murmur heard. Pulmonary/Chest: Effort normal and breath sounds normal. No stridor. No respiratory distress. She has no decreased breath sounds. She has no wheezes. She has no rhonchi. She has no rales.  Abdominal: Soft. Normal  appearance and bowel sounds are normal. She exhibits no distension. There is no tenderness. There is no rebound and no CVA tenderness.  Musculoskeletal: Normal range of motion. She exhibits edema. She exhibits no tenderness.  3+ pitting edema in BLE.  Neurological: She is alert and oriented to person, place, and time. She has normal strength. No cranial nerve deficit or sensory deficit. GCS eye subscore is 4. GCS verbal subscore is 5. GCS motor subscore is 6.  Skin: Skin is warm and dry. No abrasion and no rash noted.  Psychiatric: She has a normal mood and affect. Her speech is normal and behavior is normal.  Nursing note and vitals reviewed.   ED Course  Procedures (including critical care time) DIAGNOSTIC STUDIES: Oxygen Saturation is 99% on RA, normal by my interpretation.   COORDINATION OF CARE: 11:59 PM- Will order lab work and IV Lasix. Pt verbalizes understanding and agrees to plan.  Medications  0.9 %  sodium chloride infusion ( Intravenous New Bag/Given 12/12/13 0005)  furosemide (LASIX) injection 40 mg (40 mg Intravenous Given 12/12/13 0004)   Labs Review Labs Reviewed  PRO B NATRIURETIC PEPTIDE - Abnormal; Notable for the following:    Pro B Natriuretic peptide (BNP) 1367.0 (*)    All other components within normal limits  CBC WITH DIFFERENTIAL - Abnormal; Notable for the following:    RBC 2.74 (*)    Hemoglobin 9.3 (*)    HCT 27.2 (*)    RDW 19.0 (*)    Platelets 137 (*)    All other components within normal limits  COMPREHENSIVE METABOLIC PANEL - Abnormal; Notable for the following:    Glucose, Bld 110 (*)    BUN 74 (*)    Creatinine, Ser 2.78 (*)    Total Bilirubin <0.2 (*)    GFR calc non Af Amer 15 (*)    GFR calc Af Amer 18 (*)    Anion gap 16 (*)    All other components within normal limits    Imaging Review Dg Chest 2 View  12/12/2013   CLINICAL DATA:  Shortness of breath.  Initial encounter.  EXAM: CHEST  2 VIEW  COMPARISON:  04/23/2013 and prior  radiographs. 04/30/2013 and prior CTs  FINDINGS: Cardiomegaly again identified.  The mediastinal silhouette is unchanged.  Mild peribronchial thickening again noted.  There is no evidence of focal airspace disease, pulmonary edema, suspicious pulmonary nodule/mass, pleural effusion, or pneumothorax. No acute bony abnormalities are identified.  IMPRESSION: Cardiomegaly without evidence of acute cardiopulmonary disease.   Electronically Signed   By: Laveda AbbeJeff  Hu M.D.   On: 12/12/2013 01:02     EKG Interpretation None      MDM   Final diagnoses:  SOB (shortness of breath)    Pt to be admitted for diuresis  I personally performed the services described in this documentation, which was scribed in my presence. The recorded information has been reviewed and is accurate.    Toy BakerAnthony T Neizan Debruhl, MD  12/12/13 0203 

## 2013-12-11 NOTE — ED Notes (Signed)
Pt from home with bilateral feet swelling.  Pt recently put on lasix earlier last month for feet swelling.  Pt sts swelling has gotten worse instead of better.  No chest pain or shortness of breath reported. +3 pitting edema to BLE.

## 2013-12-12 ENCOUNTER — Encounter (HOSPITAL_COMMUNITY): Payer: Self-pay | Admitting: Internal Medicine

## 2013-12-12 ENCOUNTER — Inpatient Hospital Stay (HOSPITAL_COMMUNITY): Payer: PRIVATE HEALTH INSURANCE

## 2013-12-12 ENCOUNTER — Emergency Department (HOSPITAL_COMMUNITY): Payer: PRIVATE HEALTH INSURANCE

## 2013-12-12 DIAGNOSIS — I82403 Acute embolism and thrombosis of unspecified deep veins of lower extremity, bilateral: Secondary | ICD-10-CM | POA: Diagnosis present

## 2013-12-12 DIAGNOSIS — K219 Gastro-esophageal reflux disease without esophagitis: Secondary | ICD-10-CM | POA: Diagnosis present

## 2013-12-12 DIAGNOSIS — K5900 Constipation, unspecified: Secondary | ICD-10-CM | POA: Diagnosis present

## 2013-12-12 DIAGNOSIS — M1A9XX0 Chronic gout, unspecified, without tophus (tophi): Secondary | ICD-10-CM | POA: Diagnosis present

## 2013-12-12 DIAGNOSIS — E8770 Fluid overload, unspecified: Secondary | ICD-10-CM

## 2013-12-12 DIAGNOSIS — Z90710 Acquired absence of both cervix and uterus: Secondary | ICD-10-CM | POA: Diagnosis not present

## 2013-12-12 DIAGNOSIS — D649 Anemia, unspecified: Secondary | ICD-10-CM | POA: Diagnosis present

## 2013-12-12 DIAGNOSIS — I129 Hypertensive chronic kidney disease with stage 1 through stage 4 chronic kidney disease, or unspecified chronic kidney disease: Secondary | ICD-10-CM | POA: Diagnosis present

## 2013-12-12 DIAGNOSIS — R609 Edema, unspecified: Secondary | ICD-10-CM

## 2013-12-12 DIAGNOSIS — I059 Rheumatic mitral valve disease, unspecified: Secondary | ICD-10-CM

## 2013-12-12 DIAGNOSIS — Z79891 Long term (current) use of opiate analgesic: Secondary | ICD-10-CM | POA: Diagnosis not present

## 2013-12-12 DIAGNOSIS — M1990 Unspecified osteoarthritis, unspecified site: Secondary | ICD-10-CM | POA: Diagnosis present

## 2013-12-12 DIAGNOSIS — N184 Chronic kidney disease, stage 4 (severe): Secondary | ICD-10-CM | POA: Diagnosis present

## 2013-12-12 DIAGNOSIS — I5030 Unspecified diastolic (congestive) heart failure: Secondary | ICD-10-CM | POA: Diagnosis present

## 2013-12-12 DIAGNOSIS — E0590 Thyrotoxicosis, unspecified without thyrotoxic crisis or storm: Secondary | ICD-10-CM | POA: Diagnosis present

## 2013-12-12 DIAGNOSIS — F17210 Nicotine dependence, cigarettes, uncomplicated: Secondary | ICD-10-CM | POA: Diagnosis present

## 2013-12-12 DIAGNOSIS — E039 Hypothyroidism, unspecified: Secondary | ICD-10-CM | POA: Diagnosis present

## 2013-12-12 DIAGNOSIS — D638 Anemia in other chronic diseases classified elsewhere: Secondary | ICD-10-CM | POA: Diagnosis present

## 2013-12-12 DIAGNOSIS — Z79899 Other long term (current) drug therapy: Secondary | ICD-10-CM | POA: Diagnosis not present

## 2013-12-12 DIAGNOSIS — R0602 Shortness of breath: Secondary | ICD-10-CM | POA: Diagnosis present

## 2013-12-12 LAB — CBC WITH DIFFERENTIAL/PLATELET
BASOS ABS: 0 10*3/uL (ref 0.0–0.1)
BASOS PCT: 0 % (ref 0–1)
Basophils Absolute: 0 10*3/uL (ref 0.0–0.1)
Basophils Relative: 0 % (ref 0–1)
EOS ABS: 0.1 10*3/uL (ref 0.0–0.7)
EOS PCT: 1 % (ref 0–5)
Eosinophils Absolute: 0.1 10*3/uL (ref 0.0–0.7)
Eosinophils Relative: 2 % (ref 0–5)
HCT: 27.2 % — ABNORMAL LOW (ref 36.0–46.0)
HEMATOCRIT: 28.9 % — AB (ref 36.0–46.0)
HEMOGLOBIN: 9.3 g/dL — AB (ref 12.0–15.0)
Hemoglobin: 9.6 g/dL — ABNORMAL LOW (ref 12.0–15.0)
LYMPHS PCT: 40 % (ref 12–46)
LYMPHS PCT: 43 % (ref 12–46)
Lymphs Abs: 1.9 10*3/uL (ref 0.7–4.0)
Lymphs Abs: 2.1 10*3/uL (ref 0.7–4.0)
MCH: 32.1 pg (ref 26.0–34.0)
MCH: 33.9 pg (ref 26.0–34.0)
MCHC: 33.2 g/dL (ref 30.0–36.0)
MCHC: 34.2 g/dL (ref 30.0–36.0)
MCV: 96.7 fL (ref 78.0–100.0)
MCV: 99.3 fL (ref 78.0–100.0)
MONOS PCT: 9 % (ref 3–12)
Monocytes Absolute: 0.3 10*3/uL (ref 0.1–1.0)
Monocytes Absolute: 0.4 10*3/uL (ref 0.1–1.0)
Monocytes Relative: 7 % (ref 3–12)
NEUTROS PCT: 48 % (ref 43–77)
Neutro Abs: 2.3 10*3/uL (ref 1.7–7.7)
Neutro Abs: 2.5 10*3/uL (ref 1.7–7.7)
Neutrophils Relative %: 50 % (ref 43–77)
PLATELETS: 140 10*3/uL — AB (ref 150–400)
Platelets: 137 10*3/uL — ABNORMAL LOW (ref 150–400)
RBC: 2.74 MIL/uL — AB (ref 3.87–5.11)
RBC: 2.99 MIL/uL — ABNORMAL LOW (ref 3.87–5.11)
RDW: 18.9 % — AB (ref 11.5–15.5)
RDW: 19 % — ABNORMAL HIGH (ref 11.5–15.5)
WBC: 4.8 10*3/uL (ref 4.0–10.5)
WBC: 4.9 10*3/uL (ref 4.0–10.5)

## 2013-12-12 LAB — COMPREHENSIVE METABOLIC PANEL
ALBUMIN: 3.7 g/dL (ref 3.5–5.2)
ALK PHOS: 103 U/L (ref 39–117)
ALT: 13 U/L (ref 0–35)
ALT: 14 U/L (ref 0–35)
ANION GAP: 13 (ref 5–15)
ANION GAP: 16 — AB (ref 5–15)
AST: 17 U/L (ref 0–37)
AST: 18 U/L (ref 0–37)
Albumin: 3.7 g/dL (ref 3.5–5.2)
Alkaline Phosphatase: 103 U/L (ref 39–117)
BUN: 72 mg/dL — AB (ref 6–23)
BUN: 74 mg/dL — AB (ref 6–23)
CHLORIDE: 105 meq/L (ref 96–112)
CO2: 19 meq/L (ref 19–32)
CO2: 23 mEq/L (ref 19–32)
CREATININE: 2.7 mg/dL — AB (ref 0.50–1.10)
Calcium: 10.2 mg/dL (ref 8.4–10.5)
Calcium: 10.5 mg/dL (ref 8.4–10.5)
Chloride: 103 mEq/L (ref 96–112)
Creatinine, Ser: 2.78 mg/dL — ABNORMAL HIGH (ref 0.50–1.10)
GFR calc Af Amer: 18 mL/min — ABNORMAL LOW (ref >90)
GFR calc non Af Amer: 15 mL/min — ABNORMAL LOW (ref >90)
GFR, EST AFRICAN AMERICAN: 18 mL/min — AB (ref >90)
GFR, EST NON AFRICAN AMERICAN: 16 mL/min — AB (ref >90)
GLUCOSE: 87 mg/dL (ref 70–99)
Glucose, Bld: 110 mg/dL — ABNORMAL HIGH (ref 70–99)
Potassium: 3.9 mEq/L (ref 3.7–5.3)
Potassium: 4.3 mEq/L (ref 3.7–5.3)
Sodium: 138 mEq/L (ref 137–147)
Sodium: 141 mEq/L (ref 137–147)
TOTAL PROTEIN: 6.7 g/dL (ref 6.0–8.3)
Total Bilirubin: 0.3 mg/dL (ref 0.3–1.2)
Total Bilirubin: <0.2 mg/dL — ABNORMAL LOW (ref 0.3–1.2)
Total Protein: 6.6 g/dL (ref 6.0–8.3)

## 2013-12-12 LAB — URINALYSIS, ROUTINE W REFLEX MICROSCOPIC
Bilirubin Urine: NEGATIVE
Glucose, UA: NEGATIVE mg/dL
HGB URINE DIPSTICK: NEGATIVE
KETONES UR: NEGATIVE mg/dL
Leukocytes, UA: NEGATIVE
NITRITE: NEGATIVE
Protein, ur: NEGATIVE mg/dL
Specific Gravity, Urine: 1.008 (ref 1.005–1.030)
Urobilinogen, UA: 0.2 mg/dL (ref 0.0–1.0)
pH: 7 (ref 5.0–8.0)

## 2013-12-12 LAB — VITAMIN B12: Vitamin B-12: 412 pg/mL (ref 211–911)

## 2013-12-12 LAB — IRON AND TIBC
Iron: 73 ug/dL (ref 42–135)
SATURATION RATIOS: 29 % (ref 20–55)
TIBC: 256 ug/dL (ref 250–470)
UIBC: 183 ug/dL (ref 125–400)

## 2013-12-12 LAB — PROTIME-INR
INR: 1.06 (ref 0.00–1.49)
Prothrombin Time: 13.9 seconds (ref 11.6–15.2)

## 2013-12-12 LAB — PRO B NATRIURETIC PEPTIDE
Pro B Natriuretic peptide (BNP): 1267 pg/mL — ABNORMAL HIGH (ref 0–450)
Pro B Natriuretic peptide (BNP): 1367 pg/mL — ABNORMAL HIGH (ref 0–450)

## 2013-12-12 LAB — PHOSPHORUS: Phosphorus: 3.9 mg/dL (ref 2.3–4.6)

## 2013-12-12 LAB — TSH: TSH: 2.32 u[IU]/mL (ref 0.350–4.500)

## 2013-12-12 MED ORDER — METHIMAZOLE 5 MG PO TABS
5.0000 mg | ORAL_TABLET | Freq: Every morning | ORAL | Status: DC
  Administered 2013-12-12 – 2013-12-18 (×7): 5 mg via ORAL
  Filled 2013-12-12 (×7): qty 1

## 2013-12-12 MED ORDER — HEPARIN BOLUS VIA INFUSION
1000.0000 [IU] | Freq: Once | INTRAVENOUS | Status: AC
  Administered 2013-12-12: 1000 [IU] via INTRAVENOUS
  Filled 2013-12-12: qty 1000

## 2013-12-12 MED ORDER — HYDRALAZINE HCL 20 MG/ML IJ SOLN: 10.0000 mg | INTRAMUSCULAR | Status: DC | PRN

## 2013-12-12 MED ORDER — ZOLPIDEM TARTRATE 5 MG PO TABS
5.0000 mg | ORAL_TABLET | Freq: Once | ORAL | Status: AC
  Administered 2013-12-12: 5 mg via ORAL
  Filled 2013-12-12: qty 1

## 2013-12-12 MED ORDER — ACETAMINOPHEN 650 MG RE SUPP: 650.0000 mg | Freq: Four times a day (QID) | RECTAL | Status: DC | PRN

## 2013-12-12 MED ORDER — ACETAMINOPHEN 325 MG PO TABS
650.0000 mg | ORAL_TABLET | Freq: Four times a day (QID) | ORAL | Status: DC | PRN
  Administered 2013-12-12 – 2013-12-18 (×11): 650 mg via ORAL
  Filled 2013-12-12 (×12): qty 2

## 2013-12-12 MED ORDER — ONDANSETRON HCL 4 MG PO TABS: 4.0000 mg | ORAL_TABLET | Freq: Four times a day (QID) | ORAL | Status: DC | PRN

## 2013-12-12 MED ORDER — WARFARIN - PHARMACIST DOSING INPATIENT
Freq: Every day | Status: DC
  Administered 2013-12-13 – 2013-12-16 (×4)

## 2013-12-12 MED ORDER — HYDROCODONE-ACETAMINOPHEN 5-325 MG PO TABS
2.0000 | ORAL_TABLET | Freq: Once | ORAL | Status: AC
  Administered 2013-12-12: 2 via ORAL
  Filled 2013-12-12: qty 2

## 2013-12-12 MED ORDER — TRAMADOL HCL 50 MG PO TABS
50.0000 mg | ORAL_TABLET | Freq: Two times a day (BID) | ORAL | Status: DC | PRN
  Administered 2013-12-12: 100 mg via ORAL
  Administered 2013-12-12: 50 mg via ORAL
  Administered 2013-12-13 (×2): 100 mg via ORAL
  Administered 2013-12-14 – 2013-12-15 (×3): 50 mg via ORAL
  Administered 2013-12-16 (×2): 100 mg via ORAL
  Administered 2013-12-17 – 2013-12-18 (×3): 50 mg via ORAL
  Filled 2013-12-12: qty 1
  Filled 2013-12-12 (×2): qty 2
  Filled 2013-12-12: qty 1
  Filled 2013-12-12: qty 2
  Filled 2013-12-12: qty 1
  Filled 2013-12-12 (×2): qty 2
  Filled 2013-12-12: qty 1
  Filled 2013-12-12 (×4): qty 2

## 2013-12-12 MED ORDER — WARFARIN SODIUM 2.5 MG PO TABS
2.5000 mg | ORAL_TABLET | Freq: Once | ORAL | Status: AC
  Administered 2013-12-12: 2.5 mg via ORAL
  Filled 2013-12-12: qty 1

## 2013-12-12 MED ORDER — SODIUM CHLORIDE 0.9 % IJ SOLN
3.0000 mL | Freq: Two times a day (BID) | INTRAMUSCULAR | Status: DC
  Administered 2013-12-13: 3 mL via INTRAVENOUS

## 2013-12-12 MED ORDER — HEPARIN (PORCINE) IN NACL 100-0.45 UNIT/ML-% IJ SOLN
12.0000 [IU]/kg/h | INTRAMUSCULAR | Status: DC
  Administered 2013-12-12: 12 [IU]/kg/h via INTRAVENOUS
  Filled 2013-12-12: qty 250

## 2013-12-12 MED ORDER — IOHEXOL 300 MG/ML  SOLN
25.0000 mL | INTRAMUSCULAR | Status: AC
  Administered 2013-12-12: 25 mL via ORAL

## 2013-12-12 MED ORDER — AMLODIPINE BESYLATE 5 MG PO TABS
5.0000 mg | ORAL_TABLET | Freq: Every day | ORAL | Status: DC
  Administered 2013-12-12 – 2013-12-18 (×7): 5 mg via ORAL
  Filled 2013-12-12 (×7): qty 1

## 2013-12-12 MED ORDER — FUROSEMIDE 10 MG/ML IJ SOLN
40.0000 mg | Freq: Every day | INTRAMUSCULAR | Status: DC
  Administered 2013-12-12 – 2013-12-13 (×2): 40 mg via INTRAVENOUS
  Filled 2013-12-12 (×2): qty 4

## 2013-12-12 MED ORDER — PANTOPRAZOLE SODIUM 40 MG PO TBEC
40.0000 mg | DELAYED_RELEASE_TABLET | Freq: Every day | ORAL | Status: DC
  Administered 2013-12-12 – 2013-12-18 (×7): 40 mg via ORAL
  Filled 2013-12-12 (×7): qty 1

## 2013-12-12 MED ORDER — COUMADIN BOOK
Freq: Once | Status: AC
  Administered 2013-12-13: 20:00:00
  Filled 2013-12-12 (×2): qty 1

## 2013-12-12 MED ORDER — SODIUM CHLORIDE 0.9 % IJ SOLN
3.0000 mL | Freq: Two times a day (BID) | INTRAMUSCULAR | Status: DC
  Administered 2013-12-12 – 2013-12-13 (×3): 3 mL via INTRAVENOUS

## 2013-12-12 MED ORDER — HYDROCHLOROTHIAZIDE 12.5 MG PO CAPS
12.5000 mg | ORAL_CAPSULE | Freq: Every day | ORAL | Status: DC
  Filled 2013-12-12: qty 1

## 2013-12-12 MED ORDER — PNEUMOCOCCAL VAC POLYVALENT 25 MCG/0.5ML IJ INJ
0.5000 mL | INJECTION | INTRAMUSCULAR | Status: AC
  Administered 2013-12-13: 0.5 mL via INTRAMUSCULAR
  Filled 2013-12-12: qty 0.5

## 2013-12-12 MED ORDER — WARFARIN VIDEO
Freq: Once | Status: AC
  Administered 2013-12-13: 12:00:00

## 2013-12-12 MED ORDER — ENOXAPARIN SODIUM 30 MG/0.3ML ~~LOC~~ SOLN
30.0000 mg | Freq: Every day | SUBCUTANEOUS | Status: DC
  Administered 2013-12-12: 30 mg via SUBCUTANEOUS
  Filled 2013-12-12: qty 0.3

## 2013-12-12 MED ORDER — ALLOPURINOL 100 MG PO TABS
100.0000 mg | ORAL_TABLET | Freq: Every morning | ORAL | Status: DC
  Administered 2013-12-12 – 2013-12-18 (×7): 100 mg via ORAL
  Filled 2013-12-12 (×7): qty 1

## 2013-12-12 MED ORDER — ONDANSETRON HCL 4 MG/2ML IJ SOLN: 4.0000 mg | Freq: Four times a day (QID) | INTRAMUSCULAR | Status: DC | PRN

## 2013-12-12 MED ORDER — ENSURE COMPLETE PO LIQD
237.0000 mL | Freq: Two times a day (BID) | ORAL | Status: DC
  Administered 2013-12-13: 237 mL via ORAL

## 2013-12-12 NOTE — Plan of Care (Signed)
Problem: Phase I Progression Outcomes Goal: Dyspnea controlled at rest (HF) Outcome: Completed/Met Date Met:  12/12/13 Goal: Pain controlled with appropriate interventions Outcome: Completed/Met Date Met:  12/12/13

## 2013-12-12 NOTE — Progress Notes (Signed)
*  Preliminary Results* Bilateral lower extremity venous duplex completed. Bilateral lower extremities are positive for acute deep vein thrombosis involving the right popliteal, right posterior tibial, left common femoral, left femoral, left profunda femoral, left popliteal veins. There is chronic deep vein thrombosis involving the right common femoral and femoral veins and there is superficial vein thrombosis involving the right lesser saphenous vein. There is no evidence of Baker's cyst bilaterally.  Preliminary results discussed with LB, RN.  12/12/2013  Gertie FeyMichelle Maryelizabeth Eberle, RVT, RDCS, RDMS

## 2013-12-12 NOTE — Plan of Care (Signed)
Problem: Phase I Progression Outcomes Goal: Voiding-avoid urinary catheter unless indicated Outcome: Completed/Met Date Met:  12/12/13     

## 2013-12-12 NOTE — ED Notes (Signed)
Flow called to inquire about bed request.  

## 2013-12-12 NOTE — Progress Notes (Signed)
*  PRELIMINARY RESULTS* Echocardiogram 2D Echocardiogram has been performed.  [name] Alexander, [name] Alexander 12/12/2013, 4:20 PM

## 2013-12-12 NOTE — Evaluation (Signed)
Physical Therapy Evaluation Patient Details Name: [name] Alexander MRN: [id number] DOB: [birth year] Today's Date: 12/12/2013   History of Present Illness  80 year old female with history of chronic kidney disease stage III, chronic anemia, hypothyroidism, hypertension and gout but the ED by her family with increasing lower extremity edema. Patient is on chronic Lasix 20 mg once a day for past few days of lower extremity edema has worsened causing difficulty ambulation. She denies any chest pain or shortness of breath.  Clinical Impression  Patient demonstrates deficits in functional mobility as indicated below. Will need continued skilled PT to address deficits and maximize function. Will see as indicated and progress as tolerated.    Follow Up Recommendations Home health PT;Supervision for mobility/OOB    Equipment Recommendations  None recommended by PT    Recommendations for Other Services       Precautions / Restrictions Precautions Precautions: Fall Restrictions Weight Bearing Restrictions: No      Mobility  Bed Mobility Overal bed mobility: Needs Assistance Bed Mobility: Supine to Sit;Sit to Supine     Supine to sit: Supervision Sit to supine: Min assist   General bed mobility comments: assist to elevate LE back to bed and reposition  Transfers Overall transfer level: Needs assistance Equipment used: 4-wheeled walker Transfers: Sit to/from Stand Sit to Stand: Min guard         General transfer comment: good hand placement and safety, pt able to lock RW without VCs  Ambulation/Gait Ambulation/Gait assistance: Min guard Ambulation Distance (Feet): 60 Feet Assistive device: 4-wheeled walker Gait Pattern/deviations: Step-through pattern;Decreased stride length;Antalgic;Narrow base of support;Trunk flexed Gait velocity: decreased Gait velocity interpretation: Below normal speed for age/gender General Gait Details: VCs for upright posture and min guard for stability,  modest balance checks noted throughout activity, no physical assist required  Stairs            Wheelchair Mobility    Modified Rankin (Stroke Patients Only)       Balance                                             Pertinent Vitals/Pain Pain Assessment: No/denies pain (premedicated per patient)    Home Living Family/patient expects to be discharged to:: Private residence Living Arrangements: Other relatives Available Help at Discharge: Family;Personal care attendant Type of Home: House Home Access: Level entry (stairs in the back 2 steps)     Home Layout: One level Home Equipment: Walker - 4 wheels;Cane - single point;Bedside commode      Prior Function Level of Independence: Needs assistance   Gait / Transfers Assistance Needed: able to ambulate with rollator           Hand Dominance   Dominant Hand: Right    Extremity/Trunk Assessment   Upper Extremity Assessment: Generalized weakness           Lower Extremity Assessment: Generalized weakness (3 to 3+/5 all motions grossly)         Communication   Communication: HOH  Cognition Arousal/Alertness: Awake/alert Behavior During Therapy: WFL for tasks assessed/performed Overall Cognitive Status: Within Functional Limits for tasks assessed                      General Comments      Exercises        Assessment/Plan    PT Assessment Patient needs continued  PT services  PT Diagnosis Difficulty walking;Generalized weakness   PT Problem List Decreased strength;Decreased activity tolerance;Decreased balance;Decreased mobility;Cardiopulmonary status limiting activity  PT Treatment Interventions DME instruction;Gait training;Functional mobility training;Therapeutic activities;Therapeutic exercise;Balance training;Patient/family education   PT Goals (Current goals can be found in the Care Plan section) Acute Rehab PT Goals Patient Stated Goal: to walk with a cane  again PT Goal Formulation: With patient Time For Goal Achievement: 12/26/13 Potential to Achieve Goals: Good    Frequency Min 3X/week   Barriers to discharge        Co-evaluation               End of Session Equipment Utilized During Treatment: Gait belt Activity Tolerance: Patient tolerated treatment well;Patient limited by fatigue Patient left: in bed;with call bell/phone within reach;with bed alarm set Nurse Communication: Mobility status         Time: [id number] PT Time Calculation (min) (ACUTE ONLY): 23 min   Charges:   PT Evaluation $Initial PT Evaluation Tier I: 1 Procedure PT Treatments $Therapeutic Activity: 8-22 mins   PT G CodesFabio Asa:          Ollivander See J 12/12/2013, 5:25 PM Charlotte Crumbevon Bransyn Adami, PT DPT  [phone]

## 2013-12-12 NOTE — ED Notes (Signed)
Flow called and informed pt's bed request has been changed.

## 2013-12-12 NOTE — Progress Notes (Signed)
MD paged and notified of bilat leg DVTs. Darrel HooverWilson,Marshall Roehrich S 4:14 PM

## 2013-12-12 NOTE — Progress Notes (Signed)
[facility] PROGRESS NOTE  Meryl CrutchLillie Anguiano ZOX:[id number] DOB: Nov 20, 1933 DOA: 12/11/2013 PCP: Laurena SlimmerLARK,[name], MD  Brief narrative 80 year old female with history of chronic kidney disease stage III, chronic anemia, hypothyroidism, hypertension and gout but the ED by her family with increasing lower extremity edema. Patient is on chronic Lasix 20 mg once a day for past few days of lower extremity edema has worsened causing difficulty ambulation. She denies any chest pain or shortness of breath.   Assessment/Plan: Bilateral lower extremity edema CHF versus worsening renal function. Albumin of 3.7. Based on 40 mg IV Lasix daily. ProBNP elevated on admission. Monitor strict I/O. Patient denies any chest discomfort or shortness of breath. -Check 2-D echo and lower extremity Dopplers. -TSH normal. -PT eval  Chronic kidney disease stage IV ARB and HCTZ held. On reviewing her previous renal function does not appears to have progressed past 2 years. Avoiding nephrotoxins. Monitor renal function.  History of weight loss and malnutrition. Check iron panel, B12. PT and nutrition consult. Needs outpatient follow-up.  hypertension Continue amlodipine. Hold HCTZ.  Hyperthyroidism Normal TSH. Continue Tapazole  GERD Continue PPI  History of chronic gout Continue allopurinol. Continue when necessary tramadol.  Anemia Possibly of chronic kidney disease. Check Iron panel and B12  Code Status: Full code Family Communication: grandsons at bedside Disposition Plan: likely home once improved   Consultants:  None  Procedures:  2-D echo  Lower extremity Dopplers  Antibiotics:  None  HPI/Subjective: Patient seen and examined. Admission H&P reviewed. Eyes any chest discomfort or shortness of breath. Reports lower extremity swelling without any pain.  Objective: Filed Vitals:   12/12/13 0454  BP: 144/60  Pulse: 69  Temp: 98.1 F (36.7 C)  Resp: 18    Intake/Output  Summary (Last 24 hours) at 12/12/13 1329 Last data filed at 12/12/13 1100  Gross per 24 hour  Intake      0 ml  Output   2950 ml  Net  -2950 ml   Filed Weights   12/12/13 0454  Weight: 38.5 kg (84 lb 14 oz)    Exam:   General: Elderly female in no acute distress  HEENT: No pallor, moist oral mucosa  Chest: Left basilar crackles, no rhonchi or wheeze  Cardiovascular: Normal S1 and S2, no murmurs   abdomen: Soft, nondistended, nontender, bowel sounds present  Extremities: Warm, 2+ pitting edema     Data Reviewed: Basic Metabolic Panel:  Recent Labs Lab 12/11/13 2359 12/12/13 0746  NA 138 141  K 4.3 3.9  CL 103 105  CO2 19 23  GLUCOSE 110* 87  BUN 74* 72*  CREATININE 2.78* 2.70*  CALCIUM 10.2 10.5  PHOS  --  3.9   Liver Function Tests:  Recent Labs Lab 12/11/13 2359 12/12/13 0746  AST 18 17  ALT 14 13  ALKPHOS 103 103  BILITOT <0.2* 0.3  PROT 6.7 6.6  ALBUMIN 3.7 3.7   No results for input(s): LIPASE, AMYLASE in the last 168 hours. No results for input(s): AMMONIA in the last 168 hours. CBC:  Recent Labs Lab 12/11/13 2359 12/12/13 0746  WBC 4.9 4.8  NEUTROABS 2.5 2.3  HGB 9.3* 9.6*  HCT 27.2* 28.9*  MCV 99.3 96.7  PLT 137* 140*   Cardiac Enzymes: No results for input(s): CKTOTAL, CKMB, CKMBINDEX, TROPONINI in the last 168 hours. BNP (last 3 results)  Recent Labs  12/11/13 2359 12/12/13 0746  PROBNP 1367.0* 1267.0*   CBG: No results for input(s): GLUCAP in the last 168 hours.  No results found for this or any previous visit (from the past 240 hour(s)).   Studies: Dg Chest 2 View  12/12/2013   CLINICAL DATA:  Shortness of breath.  Initial encounter.  EXAM: CHEST  2 VIEW  COMPARISON:  04/23/2013 and prior radiographs. 04/30/2013 and prior CTs  FINDINGS: Cardiomegaly again identified.  The mediastinal silhouette is unchanged.  Mild peribronchial thickening again noted.  There is no evidence of focal airspace disease, pulmonary edema,  suspicious pulmonary nodule/mass, pleural effusion, or pneumothorax. No acute bony abnormalities are identified.  IMPRESSION: Cardiomegaly without evidence of acute cardiopulmonary disease.   Electronically Signed   By: Laveda AbbeJeff  Hu M.D.   On: 12/12/2013 01:02    Scheduled Meds: . allopurinol  100 mg Oral q morning - 10a  . amLODipine  5 mg Oral Daily  . enoxaparin (LOVENOX) injection  30 mg Subcutaneous Daily  . furosemide  40 mg Intravenous Daily  . methimazole  5 mg Oral q morning - 10a  . pantoprazole  40 mg Oral Daily  . [START ON 12/13/2013] pneumococcal 23 valent vaccine  0.5 mL Intramuscular Tomorrow-1000  . sodium chloride  3 mL Intravenous Q12H  . sodium chloride  3 mL Intravenous Q12H   Continuous Infusions:     Time spent: 25 MINUTES    Eddie NorthDHUNGEL, Davita Sublett  [facility] Pager [phone] If 7PM-7AM, please contact night-coverage at [email or web address], password [email or web address] 12/12/2013, 1:29 PM  LOS: 1 day

## 2013-12-12 NOTE — Progress Notes (Signed)
ANTICOAGULATION CONSULT NOTE - Initial Consult  Pharmacy Consult for heparin and warfarin Indication: DVT  No Known Allergies  Patient Measurements: Height: 5\' 4"  (162.6 cm) Weight: 84 lb 14 oz (38.5 kg) IBW/kg (Calculated) : 54.7 Heparin Dosing Weight: 38.5 kg  Vital Signs: Temp: 98.1 F (36.7 C) (12/02 0454) Temp Source: Oral (12/02 0454) BP: 144/60 mmHg (12/02 0454) Pulse Rate: 69 (12/02 0454)  Labs:  Recent Labs  12/11/13 2359 12/12/13 0746  HGB 9.3* 9.6*  HCT 27.2* 28.9*  PLT 137* 140*  CREATININE 2.78* 2.70*    Estimated Creatinine Clearance: 10.4 mL/min (by C-G formula based on Cr of 2.7).   Medical History: Past Medical History  Diagnosis Date  . Thyroid disease   . Hypertension   . Gout   . Pneumonia   . Arthritis     Medications:  Prescriptions prior to admission  Medication Sig Dispense Refill Last Dose  . allopurinol (ZYLOPRIM) 100 MG tablet Take 100 mg by mouth every morning.   12/11/2013 at Unknown time  . amLODipine (NORVASC) 5 MG tablet Take 5 mg by mouth daily.   12/11/2013 at Unknown time  . hydrochlorothiazide (MICROZIDE) 12.5 MG capsule Take 12.5 mg by mouth daily.   12/11/2013 at Unknown time  . HYDROcodone-acetaminophen (NORCO) 10-325 MG per tablet Take 1 tablet by mouth every 6 (six) hours as needed for severe pain.   12/11/2013 at Unknown time  . methimazole (TAPAZOLE) 5 MG tablet Take 5 mg by mouth every morning.    12/11/2013 at Unknown time  . omeprazole (PRILOSEC) 40 MG capsule Take 1 capsule by mouth 2 (two) times daily.   12/11/2013 at Unknown time  . amoxicillin-clavulanate (AUGMENTIN) 875-125 MG per tablet Take 1 tablet by mouth every 12 (twelve) hours. (Patient not taking: Reported on 12/11/2013) 14 tablet 0   . traMADol (ULTRAM) 50 MG tablet Take 50-100 mg by mouth every 6 (six) hours as needed for moderate pain.    04/23/2013 at Unknown time  . valsartan-hydrochlorothiazide (DIOVAN-HCT) 160-12.5 MG per tablet Take 1 tablet by mouth  every morning.   04/23/2013 at Unknown time   Scheduled:  . allopurinol  100 mg Oral q morning - 10a  . amLODipine  5 mg Oral Daily  . enoxaparin (LOVENOX) injection  30 mg Subcutaneous Daily  . feeding supplement (ENSURE COMPLETE)  237 mL Oral BID BM  . furosemide  40 mg Intravenous Daily  . methimazole  5 mg Oral q morning - 10a  . pantoprazole  40 mg Oral Daily  . [START ON 12/13/2013] pneumococcal 23 valent vaccine  0.5 mL Intramuscular Tomorrow-1000  . sodium chloride  3 mL Intravenous Q12H  . sodium chloride  3 mL Intravenous Q12H    Assessment: 80yo female with h/o CKD stage 3 presents with b/l LE DVT. Pharmacy is consulted to dose heparin and warfarin for DVT. CBC is low but stable based on chronic anemia. Initial INR has been ordered. Pt received enoxaparin 30mg  x1 at 1000. Will push back heparin initiation to 2100 and reduce bolus by 50%.  Patient's CHA2DS2VASc score is 4. Based on patient's age and weight, will start warfarin at lower dose.  Goal of Therapy:  INR 2-3 Heparin level 0.3-0.7 units/ml Monitor platelets by anticoagulation protocol: Yes   Plan:  Give 1000 units bolus x 1 Start heparin infusion at 450 units/hr Check anti-Xa level in 8 hours and daily while on heparin Continue to monitor H&H and platelets  Warfarin 2.5mg  tonight x1 Daily INR/CBC  Will educate patient on warfarin  Arlean Hoppingorey M. Newman PiesBall, PharmD Clinical Pharmacist Pager [phone] 12/12/2013,4:19 PM

## 2013-12-12 NOTE — Progress Notes (Signed)
INITIAL NUTRITION ASSESSMENT  DOCUMENTATION CODES Per approved criteria  -Underweight   INTERVENTION: Ensure Complete po BID, each supplement provides 350 kcal and 13 grams of protein RD to follow for nutrition care plan  NUTRITION DIAGNOSIS: Underweight related to inadequate oral intake (more than likely) as evidenced by BMI of 14.5 kg/m2  Goal: Pt to meet >/= 90% of their estimated nutrition needs   Monitor:  PO & supplemental intake, weight, labs, I/O's  Reason for Assessment: Consult  80 y.o. female  Admitting Dx: Fluid overload  ASSESSMENT: 80 year old Female with history of CKD stage III, chronic anemia, hypothyroidism, hypertension and gout; brought in by family due to increasing lower extremity edema.  RD unable to obtain nutrition hx or complete Nutrition Focused Physical Exam.  Pt sleeping.  Opened eyes when calling her name, however, fell back asleep.  No family at bedside.  No % PO intake available per flowsheet records.  Pt meets criteria for underweight state.  Would benefit from addition of oral nutrition supplements.  RD to order.  RD highly suspects malnutrition in this patient, however, unable to identify at this time.  Height: Ht Readings from Last 1 Encounters:  12/12/13 5\' 4"  (1.626 m)    Weight: Wt Readings from Last 1 Encounters:  12/12/13 84 lb 14 oz (38.5 kg)    Ideal Body Weight: 120 lb  % Ideal Body Weight: 70%  Wt Readings from Last 10 Encounters:  12/12/13 84 lb 14 oz (38.5 kg)  04/23/13 110 lb (49.896 kg)  06/17/12 136 lb (61.689 kg)  06/12/11 132 lb 0.9 oz (59.9 kg)    Usual Body Weight: 110 lb  % Usual Body Weight: 76%  BMI:  Body mass index is 14.56 kg/(m^2).  Estimated Nutritional Needs: Kcal: 1400-1600 Protein: 60-70 gm Fluid: >/= 1.5 L  Skin: Intact  Diet Order: Diet Heart  EDUCATION NEEDS: -No education needs identified at this time   Intake/Output Summary (Last 24 hours) at 12/12/13 1456 Last data filed at  12/12/13 1100  Gross per 24 hour  Intake      0 ml  Output   2950 ml  Net  -2950 ml   Labs:   Recent Labs Lab 12/11/13 2359 12/12/13 0746  NA 138 141  K 4.3 3.9  CL 103 105  CO2 19 23  BUN 74* 72*  CREATININE 2.78* 2.70*  CALCIUM 10.2 10.5  PHOS  --  3.9  GLUCOSE 110* 87    Scheduled Meds: . allopurinol  100 mg Oral q morning - 10a  . amLODipine  5 mg Oral Daily  . enoxaparin (LOVENOX) injection  30 mg Subcutaneous Daily  . furosemide  40 mg Intravenous Daily  . methimazole  5 mg Oral q morning - 10a  . pantoprazole  40 mg Oral Daily  . [START ON 12/13/2013] pneumococcal 23 valent vaccine  0.5 mL Intramuscular Tomorrow-1000  . sodium chloride  3 mL Intravenous Q12H  . sodium chloride  3 mL Intravenous Q12H    Continuous Infusions:   Past Medical History  Diagnosis Date  . Thyroid disease   . Hypertension   . Gout   . Pneumonia   . Arthritis     Past Surgical History  Procedure Laterality Date  . Abdominal hysterectomy    . Total knee arthroplasty      Maureen ChattersKatie Hanley Rispoli, RD, LDN Pager #: [phone] After-Hours Pager #: [phone]

## 2013-12-12 NOTE — H&P (Signed)
[facility] History and Physical  [name] Alexander ZOX:[id number] DOB: [birth year]39 DOA: 12/11/2013  Referring physician: ER physician. PCP: [name] Alexander   Chief Complaint: Increasing lower extremity edema.  HPI: [name] CrutchLillie Alexander is a 80 y.o. female with history of chronic kidney disease, chronic anemia, hyperthyroidism, hypertension and gout was brought to the ER after patient was found to have increasing lower extremity edema. Patient states that she takes Lasix 20 mg every day but over the last 2-3 days patient's lower extremity edema worsened extending up to her knees. Making it difficult for patient to walk. Denies any chest pain or shortness of breath. Patient has been admitted for IV diuresis. On exam patient does have 3+ pitting edema to the knees.   Review of Systems: As presented in the history of presenting illness, rest negative.  Past Medical History  Diagnosis Date  . Thyroid disease   . Hypertension   . Gout   . Pneumonia   . Arthritis    Past Surgical History  Procedure Laterality Date  . Abdominal hysterectomy    . Total knee arthroplasty     Social History:  reports that she has been smoking Cigarettes.  She has a 5 pack-year smoking history. She has never used smokeless tobacco. She reports that she does not drink alcohol or use illicit drugs. Where does patient live home. Can patient participate in ADLs? Yes.  No Known Allergies  Family History:  Family History  Problem Relation Age of Onset  . Stroke Neg Hx   . CAD Neg Hx       Prior to Admission medications   Medication Sig Start Date End Date Taking? Authorizing Provider  allopurinol (ZYLOPRIM) 100 MG tablet Take 100 mg by mouth every morning.   Yes Historical Provider, Alexander  amLODipine (NORVASC) 5 MG tablet Take 5 mg by mouth daily.   Yes Historical Provider, Alexander  hydrochlorothiazide (MICROZIDE) 12.5 MG capsule Take 12.5 mg by mouth daily.   Yes Historical Provider, Alexander  HYDROcodone-acetaminophen  (NORCO) 10-325 MG per tablet Take 1 tablet by mouth every 6 (six) hours as needed for severe pain.   Yes Historical Provider, Alexander  methimazole (TAPAZOLE) 5 MG tablet Take 5 mg by mouth every morning.    Yes Historical Provider, Alexander  omeprazole (PRILOSEC) 40 MG capsule Take 1 capsule by mouth 2 (two) times daily. 11/04/12  Yes Historical Provider, Alexander  amoxicillin-clavulanate (AUGMENTIN) 875-125 MG per tablet Take 1 tablet by mouth every 12 (twelve) hours. Patient not taking: Reported on 12/11/2013 04/23/13   Juliet RudeNathan R. Pickering, Alexander  traMADol (ULTRAM) 50 MG tablet Take 50-100 mg by mouth every 6 (six) hours as needed for moderate pain.  10/31/12   Historical Provider, Alexander  valsartan-hydrochlorothiazide (DIOVAN-HCT) 160-12.5 MG per tablet Take 1 tablet by mouth every morning.    Historical Provider, Alexander    Physical Exam: Filed Vitals:   12/12/13 0215 12/12/13 0230 12/12/13 0245 12/12/13 0300  BP: 126/59 139/69 161/68 137/62  Pulse: 69 74 76 70  Temp:      TempSrc:      Resp:      SpO2: 100% 98% 100% 100%     General:  Moderately built and nourished.  Eyes: Anicteric mild pallor.  ENT: No discharge from the ears eyes nose mouth.  Neck: No JVD appreciated no mass felt.  Cardiovascular: S1-S2 heard.  Respiratory: No rhonchi or crepitations.  Abdomen: Soft nontender bowel sounds present.  Skin: No rash.  Musculoskeletal: Bilateral lower extremity edema.  Psychiatric: Appears normal.  Neurologic: Alert awake oriented to time place and person. Moves all extremities.  Labs on Admission:  Basic Metabolic Panel:  Recent Labs Lab 12/11/13 2359  NA 138  K 4.3  CL 103  CO2 19  GLUCOSE 110*  BUN 74*  CREATININE 2.78*  CALCIUM 10.2   Liver Function Tests:  Recent Labs Lab 12/11/13 2359  AST 18  ALT 14  ALKPHOS 103  BILITOT <0.2*  PROT 6.7  ALBUMIN 3.7   No results for input(s): LIPASE, AMYLASE in the last 168 hours. No results for input(s): AMMONIA in the last 168  hours. CBC:  Recent Labs Lab 12/11/13 2359  WBC 4.9  NEUTROABS 2.5  HGB 9.3*  HCT 27.2*  MCV 99.3  PLT 137*   Cardiac Enzymes: No results for input(s): CKTOTAL, CKMB, CKMBINDEX, TROPONINI in the last 168 hours.  BNP (last 3 results)  Recent Labs  12/11/13 2359  PROBNP 1367.0*   CBG: No results for input(s): GLUCAP in the last 168 hours.  Radiological Exams on Admission: Dg Chest 2 View  12/12/2013   CLINICAL DATA:  Shortness of breath.  Initial encounter.  EXAM: CHEST  2 VIEW  COMPARISON:  04/23/2013 and prior radiographs. 04/30/2013 and prior CTs  FINDINGS: Cardiomegaly again identified.  The mediastinal silhouette is unchanged.  Mild peribronchial thickening again noted.  There is no evidence of focal airspace disease, pulmonary edema, suspicious pulmonary nodule/mass, pleural effusion, or pneumothorax. No acute bony abnormalities are identified.  IMPRESSION: Cardiomegaly without evidence of acute cardiopulmonary disease.   Electronically Signed   By: Laveda AbbeJeff  Hu M.D.   On: 12/12/2013 01:02     Assessment/Plan Principal Problem:   Fluid overload Active Problems:   Hyperthyroidism   CKD (chronic kidney disease) stage 4, GFR 15-29 ml/min   Normocytic anemia   Edema   1. Bilateral otitis media edema - probably due to progressive worsening renal function versus CHF. Patient does not have any shortness of breath or elevated JVD. At this time patient was given Lasix 40 mg IV in the ER and I have continued Lasix 40 mg IV daily. Check 2-D echo and urinalysis and closely follow intake output and metabolic panel and daily weights. Check Dopplers of the lower extremity. 2. Chronic kidney disease stage IV - since patient has progressively worsening kidney disease I have held patient's ARB at this time. See #1. 3. Hypertension - see #1 and 2. ARB on hold secondary to worsening renal function. Patient will be continued on amlodipine and patient is on Lasix. When necessary IV  hydralazine. 4. Anemia probably from kidney disease - follow CBC. 5. Hyperthyroidism - continue methimazole. Check TSH. 6. History of gout.    Code Status: Full code.  Family Communication: Patient's son at the bedside.  Disposition Plan: Admit to inpatient.    Fuquan Wilson N. [facility] Pager [phone].  If 7PM-7AM, please contact night-coverage [email or web address] Password [email or web address] 12/12/2013, 3:42 AM

## 2013-12-12 NOTE — Progress Notes (Signed)
B/l LE DVT noted. Start on IV heparin and coumadin per pharmacy. Cannot use lovenox or newer AC due to CKD stage IV

## 2013-12-12 NOTE — ED Notes (Signed)
MD at bedside. 

## 2013-12-12 NOTE — Progress Notes (Signed)
Utilization review completed.  

## 2013-12-13 DIAGNOSIS — I82403 Acute embolism and thrombosis of unspecified deep veins of lower extremity, bilateral: Principal | ICD-10-CM

## 2013-12-13 DIAGNOSIS — I509 Heart failure, unspecified: Secondary | ICD-10-CM

## 2013-12-13 DIAGNOSIS — E43 Unspecified severe protein-calorie malnutrition: Secondary | ICD-10-CM

## 2013-12-13 DIAGNOSIS — I5031 Acute diastolic (congestive) heart failure: Secondary | ICD-10-CM

## 2013-12-13 DIAGNOSIS — I82409 Acute embolism and thrombosis of unspecified deep veins of unspecified lower extremity: Secondary | ICD-10-CM

## 2013-12-13 LAB — CBC
HEMATOCRIT: 27.5 % — AB (ref 36.0–46.0)
Hemoglobin: 9.2 g/dL — ABNORMAL LOW (ref 12.0–15.0)
MCH: 32.4 pg (ref 26.0–34.0)
MCHC: 33.5 g/dL (ref 30.0–36.0)
MCV: 96.8 fL (ref 78.0–100.0)
PLATELETS: 135 10*3/uL — AB (ref 150–400)
RBC: 2.84 MIL/uL — ABNORMAL LOW (ref 3.87–5.11)
RDW: 18.7 % — AB (ref 11.5–15.5)
WBC: 4.3 10*3/uL (ref 4.0–10.5)

## 2013-12-13 LAB — BASIC METABOLIC PANEL
Anion gap: 14 (ref 5–15)
BUN: 69 mg/dL — AB (ref 6–23)
CALCIUM: 10.6 mg/dL — AB (ref 8.4–10.5)
CO2: 23 meq/L (ref 19–32)
CREATININE: 2.51 mg/dL — AB (ref 0.50–1.10)
Chloride: 100 mEq/L (ref 96–112)
GFR calc Af Amer: 20 mL/min — ABNORMAL LOW (ref >90)
GFR, EST NON AFRICAN AMERICAN: 17 mL/min — AB (ref >90)
GLUCOSE: 86 mg/dL (ref 70–99)
Potassium: 4.2 mEq/L (ref 3.7–5.3)
Sodium: 137 mEq/L (ref 137–147)

## 2013-12-13 LAB — HEPARIN LEVEL (UNFRACTIONATED)
Heparin Unfractionated: 0.18 IU/mL — ABNORMAL LOW (ref 0.30–0.70)
Heparin Unfractionated: 0.14 IU/mL — ABNORMAL LOW (ref 0.30–0.70)

## 2013-12-13 MED ORDER — HEPARIN (PORCINE) IN NACL 100-0.45 UNIT/ML-% IJ SOLN
700.0000 [IU]/h | INTRAMUSCULAR | Status: DC
  Administered 2013-12-13 – 2013-12-17 (×4): 700 [IU]/h via INTRAVENOUS
  Filled 2013-12-13 (×6): qty 250

## 2013-12-13 MED ORDER — HEPARIN BOLUS VIA INFUSION
1500.0000 [IU] | INTRAVENOUS | Status: AC
  Administered 2013-12-13: 1500 [IU] via INTRAVENOUS
  Filled 2013-12-13: qty 1500

## 2013-12-13 MED ORDER — SENNA 8.6 MG PO TABS
1.0000 | ORAL_TABLET | Freq: Every day | ORAL | Status: DC
  Administered 2013-12-13 – 2013-12-18 (×6): 8.6 mg via ORAL
  Filled 2013-12-13 (×6): qty 1

## 2013-12-13 MED ORDER — WARFARIN SODIUM 2.5 MG PO TABS
2.5000 mg | ORAL_TABLET | Freq: Once | ORAL | Status: AC
  Administered 2013-12-13: 2.5 mg via ORAL
  Filled 2013-12-13: qty 1

## 2013-12-13 MED ORDER — FUROSEMIDE 20 MG PO TABS
20.0000 mg | ORAL_TABLET | Freq: Every day | ORAL | Status: DC
  Administered 2013-12-14 – 2013-12-18 (×5): 20 mg via ORAL
  Filled 2013-12-13 (×6): qty 1

## 2013-12-13 MED ORDER — HEPARIN (PORCINE) IN NACL 100-0.45 UNIT/ML-% IJ SOLN
550.0000 [IU]/h | INTRAMUSCULAR | Status: DC
  Administered 2013-12-13: 550 [IU]/h via INTRAVENOUS
  Filled 2013-12-13: qty 250

## 2013-12-13 MED ORDER — LORAZEPAM 2 MG/ML IJ SOLN
0.5000 mg | Freq: Once | INTRAMUSCULAR | Status: AC
  Administered 2013-12-13: 0.5 mg via INTRAVENOUS
  Filled 2013-12-13: qty 1

## 2013-12-13 MED ORDER — HEPARIN BOLUS VIA INFUSION
1000.0000 [IU] | Freq: Once | INTRAVENOUS | Status: AC
  Administered 2013-12-13: 1000 [IU] via INTRAVENOUS
  Filled 2013-12-13: qty 1000

## 2013-12-13 NOTE — Progress Notes (Signed)
Physical Therapy Treatment Patient Details Name: [name] Alexander MRN: [id number] DOB: 11-Jun-1933 Today's Date: 12/13/2013    History of Present Illness 80 year old female with history of chronic kidney disease stage III, chronic anemia, hypothyroidism, hypertension and gout but the ED by her family with increasing lower extremity edema. Patient is on chronic Lasix 20 mg once a day for past few days of lower extremity edema has worsened causing difficulty ambulation. She denies any chest pain or shortness of breath.    PT Comments    Patient able to tolerate increased ambulation distance with rest breaks today. Tolerated basic there ex and some functional activities. Will continue to see and progress as tolerated. Continue to recommend HHPT.  Follow Up Recommendations  Home health PT;Supervision for mobility/OOB     Equipment Recommendations  None recommended by PT    Recommendations for Other Services       Precautions / Restrictions Precautions Precautions: Fall Restrictions Weight Bearing Restrictions: No    Mobility  Bed Mobility Overal bed mobility: Needs Assistance Bed Mobility: Supine to Sit;Sit to Supine     Supine to sit: Supervision Sit to supine: Min assist   General bed mobility comments: assist to elevate LE back to bed and reposition  Transfers Overall transfer level: Needs assistance Equipment used: 4-wheeled walker Transfers: Sit to/from Stand Sit to Stand: Min guard         General transfer comment: good hand placement and safety, pt able to lock RW without VCs, performed transfer x3 to and from bed and BSC  Ambulation/Gait Ambulation/Gait assistance: Min guard Ambulation Distance (Feet): 140 Feet (x2 with 2 standing rest breaks) Assistive device: 4-wheeled walker Gait Pattern/deviations: Step-through pattern;Decreased stride length;Antalgic;Narrow base of support;Trunk flexed (limp to right hip) Gait velocity: decreased Gait velocity  interpretation: Below normal speed for age/gender General Gait Details: Continued VCs for upright posture and min guard for stability and cues for positioning with rollator   Stairs            Wheelchair Mobility    Modified Rankin (Stroke Patients Only)       Balance                                    Cognition Arousal/Alertness: Awake/alert Behavior During Therapy: WFL for tasks assessed/performed Overall Cognitive Status: Within Functional Limits for tasks assessed                      Exercises      General Comments General comments (skin integrity, edema, etc.): tolerated hygiene activities at sink with min guard assist      Pertinent Vitals/Pain Pain Assessment: No/denies pain    Home Living                      Prior Function            PT Goals (current goals can now be found in the care plan section) Acute Rehab PT Goals Patient Stated Goal: to walk with a cane again PT Goal Formulation: With patient Time For Goal Achievement: 12/26/13 Potential to Achieve Goals: Good Progress towards PT goals: Progressing toward goals    Frequency  Min 3X/week    PT Plan Current plan remains appropriate    Co-evaluation             End of Session Equipment Utilized During Treatment: Gait belt Activity Tolerance:  Patient tolerated treatment well;Patient limited by fatigue Patient left: in bed;with call bell/phone within reach;with bed alarm set     Time: 1530-1556 PT Time Calculation (min) (ACUTE ONLY): 26 min  Charges:  $Gait Training: 8-22 mins $Therapeutic Activity: 8-22 mins                    G CodesFabio Asa:      Ousman Dise J 12/13/2013, 4:19 PM Charlotte Crumbevon Mariselda Badalamenti, PT DPT  [phone]

## 2013-12-13 NOTE — Progress Notes (Signed)
ANTICOAGULATION CONSULT NOTE  Pharmacy Consult for Heparin and Coumadin Indication: DVT  No Known Allergies  Patient Measurements: Height: 5\' 4"  (162.6 cm) Weight: 94 lb 14.4 oz (43.046 kg) IBW/kg (Calculated) : 54.7 Heparin Dosing Weight: 38.5 kg  Vital Signs: Temp: 97.7 F (36.5 C) (12/03 0500) Temp Source: Oral (12/03 0500) BP: 161/74 mmHg (12/03 0500) Pulse Rate: 68 (12/03 0500)  Labs:  Recent Labs  12/11/13 2359 12/12/13 0746 12/12/13 1845 12/13/13 0720  HGB 9.3* 9.6*  --  9.2*  HCT 27.2* 28.9*  --  27.5*  PLT 137* 140*  --  135*  LABPROT  --   --  13.9  --   INR  --   --  1.06  --   HEPARINUNFRC  --   --   --  0.18*  CREATININE 2.78* 2.70*  --  2.51*    Estimated Creatinine Clearance: 12.5 mL/min (by C-G formula based on Cr of 2.51).   Assessment: 80yo female with h/o CKD stage 3 presents with b/l LE DVT. Pharmacy is consulted to dose heparin and warfarin for DVT. CBC is low but stable based on chronic anemia. Initial INR has been ordered. Pt received enoxaparin 30mg  x1 at 1000 12/2  Initial heparin level is low at 0.18, CBC is stable   Patient's CHA2DS2VASc score is 4. Based on patient's age and weight, will start warfarin at lower dose.  Goal of Therapy:  INR 2-3 Heparin level 0.3-0.7 units/ml Monitor platelets by anticoagulation protocol: Yes   Plan:  Heparin 1000 units iv bolus x 1 Heparin drip to 550 units / hr 8 hr heparin level Repeat Warfarin 2.5mg  tonight x1   Thank you. Okey RegalLisa Witney Huie, PharmD [phone] 12/13/2013,9:04 AM

## 2013-12-13 NOTE — Progress Notes (Signed)
ANTICOAGULATION CONSULT NOTE  Pharmacy Consult for Heparin and Coumadin Indication: DVT  No Known Allergies  Patient Measurements: Height: 5\' 4"  (162.6 cm) Weight: 94 lb 14.4 oz (43.046 kg) IBW/kg (Calculated) : 54.7 Heparin Dosing Weight: 38.5 kg  Vital Signs: Temp: 98.5 F (36.9 C) (12/03 2009) Temp Source: Oral (12/03 2009) BP: 130/63 mmHg (12/03 2009) Pulse Rate: 65 (12/03 2009)  Labs:  Recent Labs  12/11/13 2359 12/12/13 0746 12/12/13 1845 12/13/13 0720 12/13/13 1844  HGB 9.3* 9.6*  --  9.2*  --   HCT 27.2* 28.9*  --  27.5*  --   PLT 137* 140*  --  135*  --   LABPROT  --   --  13.9  --   --   INR  --   --  1.06  --   --   HEPARINUNFRC  --   --   --  0.18* 0.14*  CREATININE 2.78* 2.70*  --  2.51*  --     Estimated Creatinine Clearance: 12.5 mL/min (by C-G formula based on Cr of 2.51).   Assessment: Heparin level is low at 0.14, decreased from 0.18 despite increase in heparin rate to 550 units/hr & 1000 units IV bolus given ~ 6 hours prior to level.  This is a 80yo female with h/o CKD stage 3 who presented with b/l LE DVT. Pharmacy was consulted to dose heparin and warfarin for DVT. CBC low but stable based on chronic anemia. No complication or interruption in heparin IV infusion per RN and no bleeding noted.     Goal of Therapy:  INR 2-3 Heparin level 0.3-0.7 units/ml Monitor platelets by anticoagulation protocol: Yes   Plan:  Heparin 1500 units iv bolus x 1 Heparin drip to 700 units / hr 8 hr heparin level with AM labs    Thank you. Noah Delaineuth Riley Hallum, RPh Clinical Pharmacist Pager: [phone] 12/13/2013,8:37 PM

## 2013-12-13 NOTE — Plan of Care (Signed)
Problem: Phase I Progression Outcomes Goal: Up in chair, BRP Outcome: Completed/Met Date Met:  12/13/13     

## 2013-12-13 NOTE — Discharge Instructions (Signed)

## 2013-12-13 NOTE — Progress Notes (Signed)
[facility] PROGRESS NOTE  [name] Alexander WUJ:[id number] DOB: [birth year] DOA: 12/11/2013 PCP: [name], MD  Brief narrative 80 year old female with history of chronic kidney disease stage III, chronic anemia, hypothyroidism, hypertension and gout but the ED by her family with increasing lower extremity edema with difficulty  ambulating. She denies any chest pain or shortness of breath. Patient found to have bilateral lower extremity DVT.   Assessment/Plan: Bilateral lower leg DVT No clear etiology. Patient reports being very functional at home. Placed on IV heparin and Coumadin. Given her renal function cannot use Lovenox or new anticoagulants.  CT of the abdomen negative for any obstruction or mass. No dyspnea or chest discomfort. Patient will need at least 6 months of anticoagulation.  Chronic kidney disease stage IV ARB and HCTZ held. Her renal function seems to have very slowly progressed for the past 2 years. Avoid nephrotoxins. Her pressure is currently stable  Mild CHF with bilateral lower leg edema Elevated proBNP on admission with significant bilateral lower leg edema. Patient placed on daily IV Lasix with good improvement. Has been net -3.2 L since admission. Will reduce dose to oral Lasix 20 mg once a day.  Hypertension Blood pressure stable. Continue amlodipine. Holding HCTZ and ARB  Protein calorie malnutrition and weight loss Patient reports significant weight loss over the past 6 months. CT of the abdomen unremarkable. Check CA 125 and CEA. LFTs normal. Will check CT of the chest without contrast in a.m. to rule out any lung mass or nodule. (Patient has history of heavy smoking in the past)  GERD Continue PPI  Hyperthyroidism Continue Tapazole. TSH normal  Chronic gout Stable. Continue allopurinol and when necessary tramadol   To prophylaxis: On IV heparin and Coumadin  Diet: Regular  Code Status: Full code Family Communication: None at bedside  today Disposition Plan: Home once INR therapeutic on Coumadin   Consultants:  None  Procedures:  CT abdomen  Doppler lower extremities  Antibiotics:  None  HPI/Subjective: Recent seen and examined. Reports her leg swellings to be markedly improved. Denies any dyspnea or chest discomfort.  Objective: Filed Vitals:   12/13/13 1001  BP: 136/78  Pulse:   Temp:   Resp:     Intake/Output Summary (Last 24 hours) at 12/13/13 1111 Last data filed at 12/13/13 0800  Gross per 24 hour  Intake 469.17 ml  Output    800 ml  Net -330.83 ml   Filed Weights   12/12/13 0454 12/13/13 0150  Weight: 38.5 kg (84 lb 14 oz) 43.046 kg (94 lb 14.4 oz)    Exam:   General:  Elderly thin built female in no acute distress  HEENT: No pallor, moist oral mucosa, no cervical, supraclavicular or axillary lymph nodes.  Cardiovascular: Normal S1 and S2, no murmurs  Chest: Clear to auscultation bilaterally, no added sounds  Abdomen: Soft, nondistended, nontender, bowel sounds present  Extremities: Warm, edema resolved, nontender  CNS: Alert and oriented    Data Reviewed: Basic Metabolic Panel:  Recent Labs Lab 12/11/13 2359 12/12/13 0746 12/13/13 0720  NA 138 141 137  K 4.3 3.9 4.2  CL 103 105 100  CO2 19 23 23   GLUCOSE 110* 87 86  BUN 74* 72* 69*  CREATININE 2.78* 2.70* 2.51*  CALCIUM 10.2 10.5 10.6*  PHOS  --  3.9  --    Liver Function Tests:  Recent Labs Lab 12/11/13 2359 12/12/13 0746  AST 18 17  ALT 14 13  ALKPHOS 103 103  BILITOT <0.2*  0.3  PROT 6.7 6.6  ALBUMIN 3.7 3.7   No results for input(s): LIPASE, AMYLASE in the last 168 hours. No results for input(s): AMMONIA in the last 168 hours. CBC:  Recent Labs Lab 12/11/13 2359 12/12/13 0746 12/13/13 0720  WBC 4.9 4.8 4.3  NEUTROABS 2.5 2.3  --   HGB 9.3* 9.6* 9.2*  HCT 27.2* 28.9* 27.5*  MCV 99.3 96.7 96.8  PLT 137* 140* 135*   Cardiac Enzymes: No results for input(s): CKTOTAL, CKMB,  CKMBINDEX, TROPONINI in the last 168 hours. BNP (last 3 results)  Recent Labs  12/11/13 2359 12/12/13 0746  PROBNP 1367.0* 1267.0*   CBG: No results for input(s): GLUCAP in the last 168 hours.  No results found for this or any previous visit (from the past 240 hour(s)).   Studies: Ct Abdomen Pelvis Wo Contrast  12/13/2013   CLINICAL DATA:  Abnormal weight loss.  EXAM: CT ABDOMEN AND PELVIS WITHOUT CONTRAST  TECHNIQUE: Multidetector CT imaging of the abdomen and pelvis was performed following the standard protocol without IV contrast.  COMPARISON:  CT abdomen pelvis 04/26/2013  FINDINGS: Lung bases are clear. Heart size is mildly enlarged. Atherosclerotic calcification is present diffusely without aortic aneurysm. Abdominal aorta measures 2.5 cm in diameter.  Unenhanced images of the liver reveal no mass lesions. Numerous small calcified gallstones are present. Gallbladder wall not thickened. Bile ducts nondilated. Normal spleen. Normal pancreas.  Negative for renal obstruction. Renal artery calcifications are noted bilaterally. 2 mm nonobstructing calculus in the right upper pole. 8 mm hyperdense cyst in the left lower pole not visualized on the prior study. 15 mm slightly hyperdense nodule left upper pole unchanged from the prior CT.  Negative for bowel obstruction. No bowel thickening. There is a large amount of retained stool throughout the colon. Appendix not visualized.  No free fluid.  Negative for mass or adenopathy in the abdomen.  Grade 1 slip L3-4 and L4-5 unchanged from the prior study. Bones are diffusely sclerotic which may reflect metabolic bone disease.  IMPRESSION: Constipation without bowel obstruction.  Cholelithiasis   Electronically Signed   By: Marlan Palauharles  Clark M.D.   On: 12/13/2013 07:31   Dg Chest 2 View  12/12/2013   CLINICAL DATA:  Shortness of breath.  Initial encounter.  EXAM: CHEST  2 VIEW  COMPARISON:  04/23/2013 and prior radiographs. 04/30/2013 and prior CTs  FINDINGS:  Cardiomegaly again identified.  The mediastinal silhouette is unchanged.  Mild peribronchial thickening again noted.  There is no evidence of focal airspace disease, pulmonary edema, suspicious pulmonary nodule/mass, pleural effusion, or pneumothorax. No acute bony abnormalities are identified.  IMPRESSION: Cardiomegaly without evidence of acute cardiopulmonary disease.   Electronically Signed   By: Laveda AbbeJeff  Hu M.D.   On: 12/12/2013 01:02    Scheduled Meds: . allopurinol  100 mg Oral q morning - 10a  . amLODipine  5 mg Oral Daily  . coumadin book   Does not apply Once  . feeding supplement (ENSURE COMPLETE)  237 mL Oral BID BM  . furosemide  40 mg Intravenous Daily  . heparin  1,000 Units Intravenous Once  . methimazole  5 mg Oral q morning - 10a  . pantoprazole  40 mg Oral Daily  . pneumococcal 23 valent vaccine  0.5 mL Intramuscular Tomorrow-1000  . senna  1 tablet Oral Daily  . sodium chloride  3 mL Intravenous Q12H  . sodium chloride  3 mL Intravenous Q12H  . warfarin  2.5 mg Oral ONCE-1800  .  warfarin   Does not apply Once  . Warfarin - Pharmacist Dosing Inpatient   Does not apply q1800   Continuous Infusions: . heparin 550 Units/hr (12/13/13 0927)      Time spent: 25 minutes    Coran Dipaola  [facility] Pager [phone] If 7PM-7AM, please contact night-coverage at [email or web address], password [email or web address] 12/13/2013, 11:11 AM  LOS: 2 days

## 2013-12-14 LAB — CEA: CEA: 1.4 ng/mL (ref 0.0–5.0)

## 2013-12-14 LAB — BASIC METABOLIC PANEL
ANION GAP: 18 — AB (ref 5–15)
BUN: 68 mg/dL — ABNORMAL HIGH (ref 6–23)
CALCIUM: 10 mg/dL (ref 8.4–10.5)
CO2: 18 mEq/L — ABNORMAL LOW (ref 19–32)
CREATININE: 2.59 mg/dL — AB (ref 0.50–1.10)
Chloride: 96 mEq/L (ref 96–112)
GFR calc Af Amer: 19 mL/min — ABNORMAL LOW (ref >90)
GFR calc non Af Amer: 17 mL/min — ABNORMAL LOW (ref >90)
GLUCOSE: 82 mg/dL (ref 70–99)
Potassium: 4.1 mEq/L (ref 3.7–5.3)
Sodium: 132 mEq/L — ABNORMAL LOW (ref 137–147)

## 2013-12-14 LAB — CBC
HEMATOCRIT: 25.2 % — AB (ref 36.0–46.0)
HEMOGLOBIN: 8.6 g/dL — AB (ref 12.0–15.0)
MCH: 33.3 pg (ref 26.0–34.0)
MCHC: 34.1 g/dL (ref 30.0–36.0)
MCV: 97.7 fL (ref 78.0–100.0)
Platelets: 135 10*3/uL — ABNORMAL LOW (ref 150–400)
RBC: 2.58 MIL/uL — ABNORMAL LOW (ref 3.87–5.11)
RDW: 18.9 % — AB (ref 11.5–15.5)
WBC: 4.4 10*3/uL (ref 4.0–10.5)

## 2013-12-14 LAB — PROTIME-INR
INR: 1.11 (ref 0.00–1.49)
Prothrombin Time: 14.4 seconds (ref 11.6–15.2)

## 2013-12-14 LAB — HEPARIN LEVEL (UNFRACTIONATED)
HEPARIN UNFRACTIONATED: 0.53 [IU]/mL (ref 0.30–0.70)
Heparin Unfractionated: 0.51 IU/mL (ref 0.30–0.70)

## 2013-12-14 MED ORDER — WARFARIN SODIUM 5 MG PO TABS
5.0000 mg | ORAL_TABLET | Freq: Once | ORAL | Status: AC
  Administered 2013-12-14: 5 mg via ORAL
  Filled 2013-12-14: qty 1

## 2013-12-14 MED ORDER — POLYETHYLENE GLYCOL 3350 17 G PO PACK
17.0000 g | PACK | Freq: Every day | ORAL | Status: DC
  Administered 2013-12-14 – 2013-12-18 (×4): 17 g via ORAL
  Filled 2013-12-14 (×5): qty 1

## 2013-12-14 NOTE — Progress Notes (Signed)
ANTICOAGULATION CONSULT NOTE - Follow Up Consult  Pharmacy Consult for heparin Indication: DVT  Labs:  Recent Labs  12/11/13 2359 12/12/13 0746 12/12/13 1845 12/13/13 0720 12/13/13 1844 12/14/13 0330  HGB 9.3* 9.6*  --  9.2*  --  8.6*  HCT 27.2* 28.9*  --  27.5*  --  25.2*  PLT 137* 140*  --  135*  --  135*  LABPROT  --   --  13.9  --   --  14.4  INR  --   --  1.06  --   --  1.11  HEPARINUNFRC  --   --   --  0.18* 0.14* 0.51  CREATININE 2.78* 2.70*  --  2.51*  --   --     Assessment/Plan:  80yo female therapeutic on heparin after rate increase. Will continue gtt at current rate and confirm stable with additional level.   Vernard GamblesVeronda Merrit Friesen, PharmD, BCPS  12/14/2013,4:59 AM

## 2013-12-14 NOTE — Progress Notes (Signed)
[facility] PROGRESS NOTE  [name] Alexander ZOX:[id number] DOB: 1933/07/10 DOA: 12/11/2013 PCP: Laurena SlimmerLARK,[name], MD  Brief narrative 80 year old female with history of chronic kidney disease stage III, chronic anemia, hypothyroidism, hypertension and gout but the ED by her family with increasing lower extremity edema with difficulty  ambulating. She denies any chest pain or shortness of breath. Patient found to have bilateral lower extremity DVT.   Assessment/Plan: Bilateral lower leg DVT No clear etiology. Patient reports being very functional at home. Placed on IV heparin and Coumadin. Given her renal function cannot use Lovenox or new anticoagulants.  CT of the abdomen negative for any obstruction or mass. No dyspnea or chest discomfort. Patient will need at least 6 months of anticoagulation.  Chronic kidney disease stage IV ARB and HCTZ held. Her renal function seems to have very slowly progressed for the past 2 years. Avoid nephrotoxins. BP stable.  Mild CHF with bilateral lower leg edema Elevated proBNP on admission with significant bilateral lower leg edema. Patient placed on daily IV Lasix with good improvement. Has been net -3.2 L since admission. reduced dose to oral Lasix 20 mg once a day.  Hypertension Blood pressure stable. Continue amlodipine and lasix. Holding HCTZ and ARB  Protein calorie malnutrition and weight loss Patient reports significant weight loss over the past 6 months. CT of the abdomen unremarkable. Check CA 125 and CEA. LFTs normal. Patient has history of heavy smoking in the past. Further w/up as outpt Seen by nutritionist.  Added ensure BID  GERD Continue PPI  Hyperthyroidism Continue Tapazole. TSH normal  Chronic gout Stable. Continue allopurinol and when necessary tramadol  Constipation  on sennakot. Add miralax   DVT prophylaxis: On IV heparin and Coumadin  Diet: Regular  Code Status: Full code Family Communication: None at  bedside Disposition Plan: Home once INR therapeutic on Coumadin, possibly early next week   Consultants:  None  Procedures:  CT abdomen  Doppler lower extremities  Antibiotics:  None  HPI/Subjective: Recent seen and examined. Denies any symptoms besides being constipated.  Objective: Filed Vitals:   12/14/13 1004  BP: 113/54  Pulse:   Temp:   Resp:     Intake/Output Summary (Last 24 hours) at 12/14/13 1020 Last data filed at 12/14/13 0900  Gross per 24 hour  Intake    480 ml  Output    400 ml  Net     80 ml   Filed Weights   12/12/13 0454 12/13/13 0150 12/14/13 0439  Weight: 38.5 kg (84 lb 14 oz) 43.046 kg (94 lb 14.4 oz) 41.64 kg (91 lb 12.8 oz)    Exam:   General:  Elderly thin built female in no acute distress  HEENT:  moist oral mucosa,  Cardiovascular: Normal S1 and S2, no murmurs  Chest: Clear to auscultation bilaterally, no added sounds  Abdomen: Soft, nondistended, nontender,  Extremities: Warm, edema resolved, nontender  CNS: Alert and oriented    Data Reviewed: Basic Metabolic Panel:  Recent Labs Lab 12/11/13 2359 12/12/13 0746 12/13/13 0720 12/14/13 0330  NA 138 141 137 132*  K 4.3 3.9 4.2 4.1  CL 103 105 100 96  CO2 19 23 23  18*  GLUCOSE 110* 87 86 82  BUN 74* 72* 69* 68*  CREATININE 2.78* 2.70* 2.51* 2.59*  CALCIUM 10.2 10.5 10.6* 10.0  PHOS  --  3.9  --   --    Liver Function Tests:  Recent Labs Lab 12/11/13 2359 12/12/13 0746  AST 18 17  ALT 14 13  ALKPHOS 103 103  BILITOT <0.2* 0.3  PROT 6.7 6.6  ALBUMIN 3.7 3.7   No results for input(s): LIPASE, AMYLASE in the last 168 hours. No results for input(s): AMMONIA in the last 168 hours. CBC:  Recent Labs Lab 12/11/13 2359 12/12/13 0746 12/13/13 0720 12/14/13 0330  WBC 4.9 4.8 4.3 4.4  NEUTROABS 2.5 2.3  --   --   HGB 9.3* 9.6* 9.2* 8.6*  HCT 27.2* 28.9* 27.5* 25.2*  MCV 99.3 96.7 96.8 97.7  PLT 137* 140* 135* 135*   Cardiac Enzymes: No results for  input(s): CKTOTAL, CKMB, CKMBINDEX, TROPONINI in the last 168 hours. BNP (last 3 results)  Recent Labs  12/11/13 2359 12/12/13 0746  PROBNP 1367.0* 1267.0*   CBG: No results for input(s): GLUCAP in the last 168 hours.  No results found for this or any previous visit (from the past 240 hour(s)).   Studies: Ct Abdomen Pelvis Wo Contrast  12/13/2013   CLINICAL DATA:  Abnormal weight loss.  EXAM: CT ABDOMEN AND PELVIS WITHOUT CONTRAST  TECHNIQUE: Multidetector CT imaging of the abdomen and pelvis was performed following the standard protocol without IV contrast.  COMPARISON:  CT abdomen pelvis 04/26/2013  FINDINGS: Lung bases are clear. Heart size is mildly enlarged. Atherosclerotic calcification is present diffusely without aortic aneurysm. Abdominal aorta measures 2.5 cm in diameter.  Unenhanced images of the liver reveal no mass lesions. Numerous small calcified gallstones are present. Gallbladder wall not thickened. Bile ducts nondilated. Normal spleen. Normal pancreas.  Negative for renal obstruction. Renal artery calcifications are noted bilaterally. 2 mm nonobstructing calculus in the right upper pole. 8 mm hyperdense cyst in the left lower pole not visualized on the prior study. 15 mm slightly hyperdense nodule left upper pole unchanged from the prior CT.  Negative for bowel obstruction. No bowel thickening. There is a large amount of retained stool throughout the colon. Appendix not visualized.  No free fluid.  Negative for mass or adenopathy in the abdomen.  Grade 1 slip L3-4 and L4-5 unchanged from the prior study. Bones are diffusely sclerotic which may reflect metabolic bone disease.  IMPRESSION: Constipation without bowel obstruction.  Cholelithiasis   Electronically Signed   By: Marlan Palauharles  Clark M.D.   On: 12/13/2013 07:31    Scheduled Meds: . allopurinol  100 mg Oral q morning - 10a  . amLODipine  5 mg Oral Daily  . feeding supplement (ENSURE COMPLETE)  237 mL Oral BID BM  .  furosemide  20 mg Oral Daily  . methimazole  5 mg Oral q morning - 10a  . pantoprazole  40 mg Oral Daily  . senna  1 tablet Oral Daily  . sodium chloride  3 mL Intravenous Q12H  . sodium chloride  3 mL Intravenous Q12H  . warfarin  5 mg Oral ONCE-1800  . Warfarin - Pharmacist Dosing Inpatient   Does not apply q1800   Continuous Infusions: . heparin 700 Units/hr (12/14/13 0912)      Time spent: 25 minutes    Gloriana Piltz  [facility] Pager [phone] If 7PM-7AM, please contact night-coverage at [email or web address], password [email or web address] 12/14/2013, 10:20 AM  LOS: 3 days

## 2013-12-14 NOTE — Progress Notes (Signed)
ANTICOAGULATION CONSULT NOTE  Pharmacy Consult for Heparin and Coumadin Indication: DVT  No Known Allergies  Patient Measurements: Height: 5\' 4"  (162.6 cm) Weight: 91 lb 12.8 oz (41.64 kg) IBW/kg (Calculated) : 54.7 Heparin Dosing Weight: 38.5 kg  Vital Signs: Temp: 98.7 F (37.1 C) (12/04 0439) Temp Source: Oral (12/04 0439) BP: 113/54 mmHg (12/04 1004) Pulse Rate: 77 (12/04 0439)  Labs:  Recent Labs  12/12/13 0746 12/12/13 1845  12/13/13 0720 12/13/13 1844 12/14/13 0330 12/14/13 1128  HGB 9.6*  --   --  9.2*  --  8.6*  --   HCT 28.9*  --   --  27.5*  --  25.2*  --   PLT 140*  --   --  135*  --  135*  --   LABPROT  --  13.9  --   --   --  14.4  --   INR  --  1.06  --   --   --  1.11  --   HEPARINUNFRC  --   --   < > 0.18* 0.14* 0.51 0.53  CREATININE 2.70*  --   --  2.51*  --  2.59*  --   < > = values in this interval not displayed.  Estimated Creatinine Clearance: 11.8 mL/min (by C-G formula based on Cr of 2.59).   Assessment: [age]yo female with h/o CKD stage 3 presents with b/l LE DVT. Pharmacy is consulted to dose heparin and warfarin for DVT. CBC is low but stable based on chronic anemia. Initial INR has been ordered. Pt received enoxaparin 30mg  x1 at 1000 12/2  Heparin level now therapeutic x 2 INR =1.11 -- > very little movement   Patient's CHA2DS2VASc score is 4. Based on patient's age and weight, will start warfarin at lower dose.  Goal of Therapy:  INR 2-3 Heparin level 0.3-0.7 units/ml Monitor platelets by anticoagulation protocol: Yes   Plan:  Continue heparin at 700 units / hr Coumadin 5 mg po x 1 tonight Daily labs  Thank you. Okey RegalLisa Kayslee Furey, PharmD [phone] 12/14/2013,12:44 PM

## 2013-12-14 NOTE — Progress Notes (Signed)
Medicare Important Message given? YES  (If response is "NO", the following Medicare IM given date fields will be blank)  Date Medicare IM given: 12/14/13 Medicare IM given by:  Alyla Pietila  

## 2013-12-15 DIAGNOSIS — E46 Unspecified protein-calorie malnutrition: Secondary | ICD-10-CM

## 2013-12-15 LAB — CBC
HEMATOCRIT: 27.6 % — AB (ref 36.0–46.0)
Hemoglobin: 9.2 g/dL — ABNORMAL LOW (ref 12.0–15.0)
MCH: 32.5 pg (ref 26.0–34.0)
MCHC: 33.3 g/dL (ref 30.0–36.0)
MCV: 97.5 fL (ref 78.0–100.0)
Platelets: 167 10*3/uL (ref 150–400)
RBC: 2.83 MIL/uL — AB (ref 3.87–5.11)
RDW: 18.6 % — ABNORMAL HIGH (ref 11.5–15.5)
WBC: 4.1 10*3/uL (ref 4.0–10.5)

## 2013-12-15 LAB — CA 125: CA 125: 22 U/mL (ref <35)

## 2013-12-15 LAB — HEPARIN LEVEL (UNFRACTIONATED): HEPARIN UNFRACTIONATED: 0.46 [IU]/mL (ref 0.30–0.70)

## 2013-12-15 LAB — PROTIME-INR
INR: 1.13 (ref 0.00–1.49)
Prothrombin Time: 14.6 seconds (ref 11.6–15.2)

## 2013-12-15 MED ORDER — WARFARIN SODIUM 5 MG PO TABS
5.0000 mg | ORAL_TABLET | Freq: Once | ORAL | Status: AC
  Administered 2013-12-15: 5 mg via ORAL
  Filled 2013-12-15: qty 1

## 2013-12-15 MED ORDER — TRAMADOL HCL 50 MG PO TABS
100.0000 mg | ORAL_TABLET | Freq: Once | ORAL | Status: AC
  Administered 2013-12-15: 100 mg via ORAL

## 2013-12-15 NOTE — Progress Notes (Signed)
ANTICOAGULATION CONSULT NOTE  Pharmacy Consult for Heparin and Coumadin Indication: DVT  No Known Allergies  Patient Measurements: Height: 5\' 4"  (162.6 cm) Weight: 90 lb 12.8 oz (41.187 kg) IBW/kg (Calculated) : 54.7 Heparin Dosing Weight: 38.5 kg  Vital Signs: Temp: 97.5 F (36.4 C) (12/05 1348) Temp Source: Oral (12/05 1348) BP: 122/59 mmHg (12/05 1348) Pulse Rate: 77 (12/05 1348)  Labs:  Recent Labs  12/12/13 1845  12/13/13 0720  12/14/13 0330 12/14/13 1128 12/15/13 0440  HGB  --   < > 9.2*  --  8.6*  --  9.2*  HCT  --   --  27.5*  --  25.2*  --  27.6*  PLT  --   --  135*  --  135*  --  167  LABPROT 13.9  --   --   --  14.4  --  14.6  INR 1.06  --   --   --  1.11  --  1.13  HEPARINUNFRC  --   --  0.18*  < > 0.51 0.53 0.46  CREATININE  --   --  2.51*  --  2.59*  --   --   < > = values in this interval not displayed.  Estimated Creatinine Clearance: 11.6 mL/min (by C-G formula based on Cr of 2.59).   Assessment: 80yo female with h/o CKD stage 3 presents with b/l LE DVT. Pharmacy is consulted to dose heparin and warfarin for DVT. CBC is low but stable based on chronic anemia. Initial INR has been ordered. Pt received enoxaparin 30mg  x1 at 1000 12/2  Heparin level now therapeutic  INR =1.13 -- > very little movement   Patient's CHA2DS2VASc score is 4.  Goal of Therapy:  INR 2-3 Heparin level 0.3-0.7 units/ml Monitor platelets by anticoagulation protocol: Yes   Plan:  Continue heparin at 700 units / hr Coumadin 5 mg po x 1 tonight Daily labs  Thank you. Celedonio MiyamotoJeremy Zauria Dombek, PharmD, BCPS Clinical Pharmacist Pager [phone]   12/15/2013,2:34 PM

## 2013-12-15 NOTE — Plan of Care (Signed)
Problem: Phase I Progression Outcomes Goal: Initial discharge plan identified Outcome: Completed/Met Date Met:  12/15/13     

## 2013-12-15 NOTE — Progress Notes (Signed)
[facility] PROGRESS NOTE  [name] Alexander ZOX:[id number] DOB: 1933-06-01 DOA: 12/11/2013 PCP: Laurena SlimmerLARK,[name], MD  Brief narrative 80 year old female with history of chronic kidney disease stage III, chronic anemia, hypothyroidism, hypertension and gout but the ED by her family with increasing lower extremity edema with difficulty ambulating. She denies any chest pain or shortness of breath. Patient found to have bilateral lower extremity DVT.   Assessment/Plan: Bilateral lower leg DVT No clear etiology. Patient reports being very functional at home. Placed on IV heparin and Coumadin. INR still sub therapeutic. Given her renal function cannot use Lovenox or new anticoagulants.  CT of the abdomen negative for any obstruction or mass. No dyspnea or chest discomfort. Patient will need at least 6 months of anticoagulation.  Chronic kidney disease stage IV ARB and HCTZ held. Her renal function seems to have very slowly progressed for the past 2 years. Avoid nephrotoxins. BP stable.  Mild CHF with bilateral lower leg edema Elevated proBNP on admission with significant bilateral lower leg edema. Patient placed on daily IV Lasix with good improvement. Has been net -3.2 L since admission. reduced dose to oral Lasix 20 mg once a day.  Hypertension Blood pressure stable. Continue amlodipine and lasix. Holding HCTZ and ARB  Protein calorie malnutrition and weight loss Patient reports significant weight loss over the past 6 months. CT of the abdomen unremarkable.  CA 125 and CEA negative. LFTs normal. Patient has history of heavy smoking in the past. Further w/up as outpt Seen by nutritionist. Added ensure BID.  GERD Continue PPI  Hyperthyroidism Continue Tapazole. TSH normal  Chronic gout Stable. Continue allopurinol and when necessary tramadol  Constipation on sennakot and miralax. No improvement. Will order enema   DVT prophylaxis: On IV heparin and Coumadin  Diet:  Regular  Code Status: Full code Family Communication: None at bedside Disposition Plan: Home once INR therapeutic on Coumadin, possibly early next week   Consultants:  None  Procedures:  CT abdomen  Doppler lower extremities  Antibiotics:  None  HPI/Subjective:  Objective: Filed Vitals:   12/15/13 0944  BP: 122/63  Pulse:   Temp:   Resp:     Intake/Output Summary (Last 24 hours) at 12/15/13 1124 Last data filed at 12/15/13 0700  Gross per 24 hour  Intake    540 ml  Output    950 ml  Net   -410 ml   Filed Weights   12/13/13 0150 12/14/13 0439 12/15/13 0427  Weight: 43.046 kg (94 lb 14.4 oz) 41.64 kg (91 lb 12.8 oz) 41.187 kg (90 lb 12.8 oz)    Exam:  General:  no acute distress  HEENT: moist oral mucosa,  Cardiovascular: Normal S1 and S2, no murmurs  Chest: Clear to auscultation bilaterally, no added sounds  Abdomen: Soft, nondistended, nontender,  Extremities: Warm, no edema  CNS: Alert and oriented  Data Reviewed: Basic Metabolic Panel:  Recent Labs Lab 12/11/13 2359 12/12/13 0746 12/13/13 0720 12/14/13 0330  NA 138 141 137 132*  K 4.3 3.9 4.2 4.1  CL 103 105 100 96  CO2 19 23 23  18*  GLUCOSE 110* 87 86 82  BUN 74* 72* 69* 68*  CREATININE 2.78* 2.70* 2.51* 2.59*  CALCIUM 10.2 10.5 10.6* 10.0  PHOS  --  3.9  --   --    Liver Function Tests:  Recent Labs Lab 12/11/13 2359 12/12/13 0746  AST 18 17  ALT 14 13  ALKPHOS 103 103  BILITOT <0.2* 0.3  PROT 6.7 6.6  ALBUMIN 3.7 3.7   No results for input(s): LIPASE, AMYLASE in the last 168 hours. No results for input(s): AMMONIA in the last 168 hours. CBC:  Recent Labs Lab 12/11/13 2359 12/12/13 0746 12/13/13 0720 12/14/13 0330 12/15/13 0440  WBC 4.9 4.8 4.3 4.4 4.1  NEUTROABS 2.5 2.3  --   --   --   HGB 9.3* 9.6* 9.2* 8.6* 9.2*  HCT 27.2* 28.9* 27.5* 25.2* 27.6*  MCV 99.3 96.7 96.8 97.7 97.5  PLT 137* 140* 135* 135* 167   Cardiac Enzymes: No results for input(s):  CKTOTAL, CKMB, CKMBINDEX, TROPONINI in the last 168 hours. BNP (last 3 results)  Recent Labs  12/11/13 2359 12/12/13 0746  PROBNP 1367.0* 1267.0*   CBG: No results for input(s): GLUCAP in the last 168 hours.  No results found for this or any previous visit (from the past 240 hour(s)).   Studies: No results found.  Scheduled Meds: . allopurinol  100 mg Oral q morning - 10a  . amLODipine  5 mg Oral Daily  . feeding supplement (ENSURE COMPLETE)  237 mL Oral BID BM  . furosemide  20 mg Oral Daily  . methimazole  5 mg Oral q morning - 10a  . pantoprazole  40 mg Oral Daily  . polyethylene glycol  17 g Oral Daily  . senna  1 tablet Oral Daily  . sodium chloride  3 mL Intravenous Q12H  . sodium chloride  3 mL Intravenous Q12H  . Warfarin - Pharmacist Dosing Inpatient   Does not apply q1800   Continuous Infusions: . heparin 700 Units/hr (12/14/13 0912)      Time spent: 25 minutes    [name] Alexander  [facility] Pager :. If 7PM-7AM, please contact night-coverage at [email or web address], password [email or web address] 12/15/2013, 11:24 AM  LOS: 4 days

## 2013-12-15 NOTE — Plan of Care (Signed)
Problem: Phase I Progression Outcomes Goal: EF % per last Echo/documented,Core Reminder form on chart Outcome: Completed/Met Date Met:  12/15/13     

## 2013-12-16 LAB — BASIC METABOLIC PANEL
ANION GAP: 12 (ref 5–15)
BUN: 60 mg/dL — AB (ref 6–23)
CO2: 24 mEq/L (ref 19–32)
Calcium: 10.9 mg/dL — ABNORMAL HIGH (ref 8.4–10.5)
Chloride: 100 mEq/L (ref 96–112)
Creatinine, Ser: 2.59 mg/dL — ABNORMAL HIGH (ref 0.50–1.10)
GFR calc Af Amer: 19 mL/min — ABNORMAL LOW (ref >90)
GFR, EST NON AFRICAN AMERICAN: 17 mL/min — AB (ref >90)
Glucose, Bld: 95 mg/dL (ref 70–99)
POTASSIUM: 4.1 meq/L (ref 3.7–5.3)
SODIUM: 136 meq/L — AB (ref 137–147)

## 2013-12-16 LAB — PROTIME-INR
INR: 1.23 (ref 0.00–1.49)
Prothrombin Time: 15.6 seconds — ABNORMAL HIGH (ref 11.6–15.2)

## 2013-12-16 LAB — HEPARIN LEVEL (UNFRACTIONATED): HEPARIN UNFRACTIONATED: 0.5 [IU]/mL (ref 0.30–0.70)

## 2013-12-16 MED ORDER — WARFARIN SODIUM 7.5 MG PO TABS
7.5000 mg | ORAL_TABLET | Freq: Once | ORAL | Status: AC
  Administered 2013-12-16: 7.5 mg via ORAL
  Filled 2013-12-16: qty 1

## 2013-12-16 NOTE — Progress Notes (Signed)
ANTICOAGULATION CONSULT NOTE  Pharmacy Consult for Heparin and Coumadin Indication: DVT  No Known Allergies  Patient Measurements: Height: 5\' 4"  (162.6 cm) Weight: 83 lb 12.4 oz (38 kg) IBW/kg (Calculated) : 54.7 Heparin Dosing Weight: 38.5 kg  Vital Signs: Temp: 98 F (36.7 C) (12/06 1319) Temp Source: Oral (12/06 1319) BP: 121/61 mmHg (12/06 1319) Pulse Rate: 69 (12/06 1319)  Labs:  Recent Labs  12/14/13 0330 12/14/13 1128 12/15/13 0440 12/16/13 0530 12/16/13 1114  HGB 8.6*  --  9.2*  --   --   HCT 25.2*  --  27.6*  --   --   PLT 135*  --  167  --   --   LABPROT 14.4  --  14.6 15.6*  --   INR 1.11  --  1.13 1.23  --   HEPARINUNFRC 0.51 0.53 0.46 0.50  --   CREATININE 2.59*  --   --   --  2.59*    Estimated Creatinine Clearance: 10.7 mL/min (by C-G formula based on Cr of 2.59).   Assessment: 80yo female with h/o CKD stage 3 presents with b/l LE DVT. Pharmacy is consulted to dose heparin and warfarin for DVT. CBC is low but stable based on chronic anemia.  Heparin level therapeutic  INR =1.23 -- > very little movement   Patient's CHA2DS2VASc score is 4.  Goal of Therapy:  INR 2-3 Heparin level 0.3-0.7 units/ml Monitor platelets by anticoagulation protocol: Yes   Plan:  Continue heparin at 700 units / hr Coumadin 7.5 mg po x 1 tonight Daily INR, CBC, heparin level  Thank you. Celedonio MiyamotoJeremy Dailee Manalang, PharmD, BCPS Clinical Pharmacist Pager [phone]   12/16/2013,1:56 PM

## 2013-12-16 NOTE — Progress Notes (Signed)
[facility] PROGRESS NOTE  Meryl CrutchLillie Milillo WUJ:[id number] DOB: 02-12-1933 DOA: 12/11/2013 PCP: Laurena SlimmerLARK,[name], MD  Brief narrative 80 year old female with history of chronic kidney disease stage III, chronic anemia, hypothyroidism, hypertension and gout but the ED by her family with increasing lower extremity edema with difficulty ambulating. She denies any chest pain or shortness of breath. Patient found to have bilateral lower extremity DVT.   Assessment/Plan: Bilateral lower leg DVT No clear etiology. Patient reports being very functional at home. Placed on IV heparin and Coumadin. INR still sub therapeutic. Given her renal function cannot use Lovenox or new anticoagulants.  CT of the abdomen negative for any obstruction or mass. No dyspnea or chest discomfort. Patient will need at least 6 months of anticoagulation with coumadin. Arrange HH upon d/c  Chronic kidney disease stage IV ARB and HCTZ held. Her renal function seems to have very slowly progressed for the past 2 years. Avoid nephrotoxins. BP stable.  Mild CHF with bilateral lower leg edema Elevated proBNP on admission with significant bilateral lower leg edema. Patient placed on daily IV Lasix with good improvement. Has been net -3.2 L since admission. On low dose oral Lasix 20 mg once a day.  Hypertension Blood pressure stable. Continue amlodipine and low dose lasix. Holding HCTZ and ARB  Protein calorie malnutrition and weight loss - reports significant weight loss over the past 6 months. CT of the abdomen unremarkable. CA 125 and CEA negative. LFTs normal. Patient has history of heavy smoking in the past. Further w/up as outpt Seen by nutritionist. Added ensure BID.  GERD Continue PPI  Hyperthyroidism Continue Tapazole. TSH normal  Chronic gout Stable. Continue allopurinol and when necessary tramadol.   Constipation on sennakot and miralax.    DVT prophylaxis: On IV heparin and Coumadin  Diet:  Regular  Code Status: Full code Family Communication: None at bedside Disposition Plan: Home once INR therapeutic on Coumadin,   Consultants:  None  Procedures:  CT abdomen  Doppler lower extremities  Antibiotics:  None  HPI/Subjective: had one onset of severe left hip pain yesterday. Resolved with tramadol  Objective: Filed Vitals:   12/16/13 1004  BP: 121/63  Pulse:   Temp:   Resp:     Intake/Output Summary (Last 24 hours) at 12/16/13 1022 Last data filed at 12/16/13 0703  Gross per 24 hour  Intake    444 ml  Output    500 ml  Net    -56 ml   Filed Weights   12/14/13 0439 12/15/13 0427 12/16/13 0700  Weight: 41.64 kg (91 lb 12.8 oz) 41.187 kg (90 lb 12.8 oz) 38 kg (83 lb 12.4 oz)    Exam:   General: no acute distress  HEENT: moist oral mucosa,  Cardiovascular: Normal S1 and S2, no murmurs  Chest: Clear to auscultation bilaterally,   Abdomen: Soft, nondistended, nontender,  Extremities: Warm, no edema    Data Reviewed: Basic Metabolic Panel:  Recent Labs Lab 12/11/13 2359 12/12/13 0746 12/13/13 0720 12/14/13 0330  NA 138 141 137 132*  K 4.3 3.9 4.2 4.1  CL 103 105 100 96  CO2 19 23 23  18*  GLUCOSE 110* 87 86 82  BUN 74* 72* 69* 68*  CREATININE 2.78* 2.70* 2.51* 2.59*  CALCIUM 10.2 10.5 10.6* 10.0  PHOS  --  3.9  --   --    Liver Function Tests:  Recent Labs Lab 12/11/13 2359 12/12/13 0746  AST 18 17  ALT 14 13  ALKPHOS 103 103  BILITOT <0.2* 0.3  PROT 6.7 6.6  ALBUMIN 3.7 3.7   No results for input(s): LIPASE, AMYLASE in the last 168 hours. No results for input(s): AMMONIA in the last 168 hours. CBC:  Recent Labs Lab 12/11/13 2359 12/12/13 0746 12/13/13 0720 12/14/13 0330 12/15/13 0440  WBC 4.9 4.8 4.3 4.4 4.1  NEUTROABS 2.5 2.3  --   --   --   HGB 9.3* 9.6* 9.2* 8.6* 9.2*  HCT 27.2* 28.9* 27.5* 25.2* 27.6*  MCV 99.3 96.7 96.8 97.7 97.5  PLT 137* 140* 135* 135* 167   Cardiac Enzymes: No results for  input(s): CKTOTAL, CKMB, CKMBINDEX, TROPONINI in the last 168 hours. BNP (last 3 results)  Recent Labs  12/11/13 2359 12/12/13 0746  PROBNP 1367.0* 1267.0*   CBG: No results for input(s): GLUCAP in the last 168 hours.  No results found for this or any previous visit (from the past 240 hour(s)).   Studies: No results found.  Scheduled Meds: . allopurinol  100 mg Oral q morning - 10a  . amLODipine  5 mg Oral Daily  . feeding supplement (ENSURE COMPLETE)  237 mL Oral BID BM  . furosemide  20 mg Oral Daily  . methimazole  5 mg Oral q morning - 10a  . pantoprazole  40 mg Oral Daily  . polyethylene glycol  17 g Oral Daily  . senna  1 tablet Oral Daily  . sodium chloride  3 mL Intravenous Q12H  . sodium chloride  3 mL Intravenous Q12H  . Warfarin - Pharmacist Dosing Inpatient   Does not apply q1800   Continuous Infusions: . heparin 700 Units/hr (12/15/13 1843)      Time spent: 25 minutes    Ardean Melroy  [facility] Pager [phone] If 7PM-7AM, please contact night-coverage at [email or web address], password [email or web address] 12/16/2013, 10:22 AM  LOS: 5 days

## 2013-12-17 LAB — PROTIME-INR
INR: 1.61 — ABNORMAL HIGH (ref 0.00–1.49)
Prothrombin Time: 19.3 seconds — ABNORMAL HIGH (ref 11.6–15.2)

## 2013-12-17 LAB — CBC
HCT: 26.4 % — ABNORMAL LOW (ref 36.0–46.0)
Hemoglobin: 9 g/dL — ABNORMAL LOW (ref 12.0–15.0)
MCH: 34.5 pg — ABNORMAL HIGH (ref 26.0–34.0)
MCHC: 34.1 g/dL (ref 30.0–36.0)
MCV: 101.1 fL — AB (ref 78.0–100.0)
PLATELETS: 163 10*3/uL (ref 150–400)
RBC: 2.61 MIL/uL — ABNORMAL LOW (ref 3.87–5.11)
RDW: 18.9 % — ABNORMAL HIGH (ref 11.5–15.5)
WBC: 4.3 10*3/uL (ref 4.0–10.5)

## 2013-12-17 LAB — HEPARIN LEVEL (UNFRACTIONATED): Heparin Unfractionated: 0.4 IU/mL (ref 0.30–0.70)

## 2013-12-17 MED ORDER — WARFARIN SODIUM 5 MG PO TABS
5.0000 mg | ORAL_TABLET | Freq: Once | ORAL | Status: AC
  Administered 2013-12-17: 5 mg via ORAL
  Filled 2013-12-17: qty 1

## 2013-12-17 NOTE — Progress Notes (Signed)
ANTICOAGULATION CONSULT NOTE - Follow Up Consult  Pharmacy Consult for Heparin and Coumadin Indication: DVT  No Known Allergies  Patient Measurements: Height: 5\' 4"  (162.6 cm) Weight: 90 lb (40.824 kg) IBW/kg (Calculated) : 54.7  Vital Signs: Temp: 98 F (36.7 C) (12/07 0344) Temp Source: Oral (12/07 0344) BP: 123/52 mmHg (12/07 0344) Pulse Rate: 59 (12/07 0344)  Labs:  Recent Labs  12/15/13 0440 12/16/13 0530 12/16/13 1114 12/17/13 0325  HGB 9.2*  --   --  9.0*  HCT 27.6*  --   --  26.4*  PLT 167  --   --  163  LABPROT 14.6 15.6*  --  19.3*  INR 1.13 1.23  --  1.61*  HEPARINUNFRC 0.46 0.50  --  0.40  CREATININE  --   --  2.59*  --     Estimated Creatinine Clearance: 11.5 mL/min (by C-G formula based on Cr of 2.59).   Medications:  Heparin 700 units/hr  Assessment: 80yof on heparin bridging to Coumadin (Day 6 overlap) for acute DVT. Heparin level (0.4) remains therapeutic - will continue current rate and follow-up AM labs. INR (1.61) is subtherapeutic but responded significantly to Coumadin 7.5mg  x 1 - expect INR to continue to increase in this small female with increased dose, will plan to change dose back to 5mg  tonight and follow-up AM INR.  - H/H and Plts stable - No significant bleeding reported  Goal of Therapy:  INR 2-3 Heparin level 0.3-0.7 units/ml Monitor platelets by anticoagulation protocol: Yes   Plan:  1. Continue heparin 700 units/hr (7 ml/hr) 2. Coumadin 5mg  PO x 1 today 2. Follow-up daily heparin level, CBC and INR  Cleon DewDulaney, Minneola Robert  [phone] 12/17/2013,9:48 AM

## 2013-12-17 NOTE — Progress Notes (Signed)
[facility] PROGRESS NOTE  [name] CrutchLillie Sandiford WUJ:[id number] DOB: 10-Aug-1933 DOA: 12/11/2013 PCP: Laurena SlimmerLARK,[name], MD Brief narrative 80 year old female with history of chronic kidney disease stage III, chronic anemia, hypothyroidism, hypertension and gout but the ED by her family with increasing lower extremity edema with difficulty ambulating. She denies any chest pain or shortness of breath. Patient found to have bilateral lower extremity DVT.  Assessment/Plan: Bilateral lower leg DVT No clear etiology. Patient reports being very functional at home. Placed on IV heparin and Coumadin. INR slowly improving but still sub therapeutic.  cannot use Lovenox or new anticoagulants due to poor renal function.  CT of the abdomen negative for any obstruction or mass. No dyspnea or chest discomfort. Patient will need at least 6 months of anticoagulation with coumadin. Arrange HH upon d/c.   Chronic kidney disease stage IV ARB and HCTZ held. Her renal function seems to have very slowly progressed for the past 2 years. Avoid nephrotoxins. BP stable.  Mild CHF with bilateral lower leg edema Elevated proBNP on admission with significant bilateral lower leg edema. Patient placed on daily IV Lasix with good improvement. H  Hypertension Blood pressure stable. Continue amlodipine . DC Lasix.Marland Kitchen. Holding HCTZ and ARB  Protein calorie malnutrition and weight loss - reports significant weight loss over the past 6 months. CT of the abdomen unremarkable. CA 125 and CEA negative. LFTs normal. Patient has history of heavy smoking in the past. Further w/up as outpt Seen by nutritionist. Added ensure BID.  GERD Continue PPI  Hyperthyroidism Continue Tapazole. TSH normal  Chronic gout Stable. Continue allopurinol and when necessary tramadol.   Constipation on sennakot and miralax.    DVT prophylaxis: On IV heparin and Coumadin  Diet: Regular  Code Status: Full code Family Communication: None at  bedside Disposition Plan: Home once INR therapeutic on Coumadin, possibly tomorrow   Consultants:  None  Procedures:  CT abdomen  Doppler lower extremities  Antibiotics:  None    HPI/Subjective: No overnight issues. Pt frustrated having to stay in the hospital  Objective: Filed Vitals:   12/17/13 0344  BP: 123/52  Pulse: 59  Temp: 98 F (36.7 C)  Resp: 18    Intake/Output Summary (Last 24 hours) at 12/17/13 1246 Last data filed at 12/17/13 0900  Gross per 24 hour  Intake    440 ml  Output    400 ml  Net     40 ml   Filed Weights   12/15/13 0427 12/16/13 0700 12/17/13 0344  Weight: 41.187 kg (90 lb 12.8 oz) 38 kg (83 lb 12.4 oz) 40.824 kg (90 lb)    Exam:   General: no acute distress  HEENT: moist oral mucosa,  Cardiovascular: Normal S1 and S2, no murmurs  Chest: Clear to auscultation bilaterally,   Abdomen: Soft, nondistended, nontender,  Extremities: Warm, no edema  Data Reviewed: Basic Metabolic Panel:  Recent Labs Lab 12/11/13 2359 12/12/13 0746 12/13/13 0720 12/14/13 0330 12/16/13 1114  NA 138 141 137 132* 136*  K 4.3 3.9 4.2 4.1 4.1  CL 103 105 100 96 100  CO2 19 23 23  18* 24  GLUCOSE 110* 87 86 82 95  BUN 74* 72* 69* 68* 60*  CREATININE 2.78* 2.70* 2.51* 2.59* 2.59*  CALCIUM 10.2 10.5 10.6* 10.0 10.9*  PHOS  --  3.9  --   --   --    Liver Function Tests:  Recent Labs Lab 12/11/13 2359 12/12/13 0746  AST 18 17  ALT 14 13  ALKPHOS 103 103  BILITOT <0.2* 0.3  PROT 6.7 6.6  ALBUMIN 3.7 3.7   No results for input(s): LIPASE, AMYLASE in the last 168 hours. No results for input(s): AMMONIA in the last 168 hours. CBC:  Recent Labs Lab 12/11/13 2359 12/12/13 0746 12/13/13 0720 12/14/13 0330 12/15/13 0440 12/17/13 0325  WBC 4.9 4.8 4.3 4.4 4.1 4.3  NEUTROABS 2.5 2.3  --   --   --   --   HGB 9.3* 9.6* 9.2* 8.6* 9.2* 9.0*  HCT 27.2* 28.9* 27.5* 25.2* 27.6* 26.4*  MCV 99.3 96.7 96.8 97.7 97.5 101.1*  PLT 137*  140* 135* 135* 167 163   Cardiac Enzymes: No results for input(s): CKTOTAL, CKMB, CKMBINDEX, TROPONINI in the last 168 hours. BNP (last 3 results)  Recent Labs  12/11/13 2359 12/12/13 0746  PROBNP 1367.0* 1267.0*   CBG: No results for input(s): GLUCAP in the last 168 hours.  No results found for this or any previous visit (from the past 240 hour(s)).   Studies: No results found.  Scheduled Meds: . allopurinol  100 mg Oral q morning - 10a  . amLODipine  5 mg Oral Daily  . feeding supplement (ENSURE COMPLETE)  237 mL Oral BID BM  . furosemide  20 mg Oral Daily  . methimazole  5 mg Oral q morning - 10a  . pantoprazole  40 mg Oral Daily  . polyethylene glycol  17 g Oral Daily  . senna  1 tablet Oral Daily  . sodium chloride  3 mL Intravenous Q12H  . sodium chloride  3 mL Intravenous Q12H  . warfarin  5 mg Oral ONCE-1800  . Warfarin - Pharmacist Dosing Inpatient   Does not apply q1800   Continuous Infusions: . heparin 700 Units/hr (12/17/13 0415)      Time spent: 20 minutes    Khalon Cansler  [facility] Pager [phone]. If 7PM-7AM, please contact night-coverage at [email or web address], password [email or web address] 12/17/2013, 12:46 PM  LOS: 6 days

## 2013-12-17 NOTE — Plan of Care (Signed)
Problem: Phase II Progression Outcomes Goal: 02 sats trending upward/stable (PE) Outcome: Not Applicable Date Met:  25/74/93 Goal: Discharge plan established Outcome: Progressing Goal: Tolerating diet Outcome: Completed/Met Date Met:  12/17/13

## 2013-12-17 NOTE — Progress Notes (Signed)
Physical Therapy Treatment Patient Details Name: [name] Alexander MRN: [id number] DOB: 10/21/1933 Today's Date: 12/17/2013    History of Present Illness 80 year old female with history of chronic kidney disease stage III, chronic anemia, hypothyroidism, hypertension and gout but the ED by her family with increasing lower extremity edema. Patient is on chronic Lasix 20 mg once a day for past few days of lower extremity edema has worsened causing difficulty ambulation. She denies any chest pain or shortness of breath.    PT Comments    Patient is progressing towards physical therapy goals, ambulating up to 100 feet with a rollator and min guard assist for safety. Tolerated therapeutic exercises well and is motivated to continue improving her functional independence. Will continue to follow acutely until d/c.   Follow Up Recommendations  Home health PT;Supervision for mobility/OOB     Equipment Recommendations  None recommended by PT    Recommendations for Other Services       Precautions / Restrictions Precautions Precautions: Fall Restrictions Weight Bearing Restrictions: No    Mobility  Bed Mobility Overal bed mobility: Needs Assistance Bed Mobility: Supine to Sit     Supine to sit: Supervision     General bed mobility comments: Supervision for safety. No physical assist required. VC for technique  Transfers Overall transfer level: Needs assistance Equipment used: 4-wheeled walker Transfers: Sit to/from Stand Sit to Stand: Min guard         General transfer comment: Close guard for safety. Mildy unstable upon standing but improves with support from rollator. Performed from lowest bed setting. Cues for hand placement prior to sitting.  Ambulation/Gait Ambulation/Gait assistance: Min guard Ambulation Distance (Feet): 100 Feet Assistive device: 4-wheeled walker Gait Pattern/deviations: Step-through pattern;Step-to pattern;Decreased step length - right;Decreased stance  time - left;Trendelenburg;Antalgic;Shuffle;Trunk flexed;Narrow base of support Gait velocity: decreased   General Gait Details: VC for upright posture, and to focus on step through gait pattern with improved foot clearance. Tends to shuffle RLE due to left trendelenburg. No loss of balance while holding RW requring physical assist but min guard for safety.   Stairs            Wheelchair Mobility    Modified Rankin (Stroke Patients Only)       Balance                                    Cognition Arousal/Alertness: Awake/alert Behavior During Therapy: WFL for tasks assessed/performed Overall Cognitive Status: Within Functional Limits for tasks assessed                      Exercises General Exercises - Lower Extremity Ankle Circles/Pumps: AROM;Both;10 reps;Seated Quad Sets: Strengthening;Both;10 reps;Seated Gluteal Sets: Strengthening;Both;10 reps;Seated Long Arc Quad: Strengthening;Both;10 reps;Seated Hip Flexion/Marching: Strengthening;Both;10 reps;Seated    General Comments        Pertinent Vitals/Pain Pain Assessment: 0-10 Pain Score:  ("left hip and knee get sore" no value given) Pain Location: Lt  hip and knee Pain Descriptors / Indicators: Aching Pain Intervention(s): Monitored during session;Repositioned    Home Living                      Prior Function            PT Goals (current goals can now be found in the care plan section) Acute Rehab PT Goals PT Goal Formulation: With patient Time For Goal Achievement:  12/26/13 Potential to Achieve Goals: Good Progress towards PT goals: Progressing toward goals    Frequency  Min 3X/week    PT Plan Current plan remains appropriate    Co-evaluation             End of Session Equipment Utilized During Treatment: Gait belt Activity Tolerance: Patient tolerated treatment well Patient left: with call bell/phone within reach;in chair     Time: [id number] PT Time  Calculation (min) (ACUTE ONLY): 23 min  Charges:  $Gait Training: 8-22 mins $Therapeutic Exercise: 8-22 mins                    G Codes:      [place], Miriana Gaertner S 12/17/2013, 12:56 PM  Sunday SpillersLogan Secor [place], [place] [phone]

## 2013-12-17 NOTE — Plan of Care (Signed)
Problem: Discharge Progression Outcomes Goal: Hemodynamically stable Outcome: Completed/Met Date Met:  73/40/37 Goal: Complications resolved/controlled Outcome: Progressing Goal: Tolerating diet Outcome: Progressing

## 2013-12-17 NOTE — Progress Notes (Signed)
CARE MANAGEMENT NOTE 12/17/2013  Patient:  [name] Alexander,[name] Alexander   Account Number:  [id number]  Date Initiated:  12/14/2013  Documentation initiated by:  Donn PieriniWEBSTER,KRISTI  Subjective/Objective Assessment:   Pt admitted with bil. DVTs and CHF     Action/Plan:   PTA pt lived at home alone   Anticipated DC Date:  12/17/2013   Anticipated DC Plan:  HOME W HOME HEALTH SERVICES      DC Planning Services  CM consult      [facility]   Choice offered to / List presented to:  [name]        HH arranged  HH-1 RN  HH-2 PT      [facility]  [facility]   Status of service:  Completed, signed off Medicare Important Message given?  YES (If response is "NO", the following Medicare IM given date fields will be blank) Date Medicare IM given:  12/14/2013 Medicare IM given by:  Donn PieriniWEBSTER,KRISTI Date Additional Medicare IM given:  12/17/2013 Additional Medicare IM given by:  Isidoro DonningALESIA Diandra Cimini  [facility]  Per UR Regulation:  Reviewed for med. necessity/level of care/duration of stay  If discussed at Long Length of Stay Meetings, dates discussed:    Comments:  12/14/13- 1400- Donn PieriniKristi Webster RN, BSN [phone] Spoke with pt at bedside- per conversation pt lives alone but has assistance by a close friend that is like family- [facility]- awaiting orders- will need RN and PT. NCM to continue to follow.

## 2013-12-18 LAB — CBC
HCT: 27.3 % — ABNORMAL LOW (ref 36.0–46.0)
HEMOGLOBIN: 9 g/dL — AB (ref 12.0–15.0)
MCH: 32.4 pg (ref 26.0–34.0)
MCHC: 33 g/dL (ref 30.0–36.0)
MCV: 98.2 fL (ref 78.0–100.0)
PLATELETS: 171 10*3/uL (ref 150–400)
RBC: 2.78 MIL/uL — AB (ref 3.87–5.11)
RDW: 19 % — ABNORMAL HIGH (ref 11.5–15.5)
WBC: 4.2 10*3/uL (ref 4.0–10.5)

## 2013-12-18 LAB — HEPARIN LEVEL (UNFRACTIONATED): Heparin Unfractionated: 0.48 IU/mL (ref 0.30–0.70)

## 2013-12-18 LAB — PROTIME-INR
INR: 2.22 — ABNORMAL HIGH (ref 0.00–1.49)
Prothrombin Time: 24.8 seconds — ABNORMAL HIGH (ref 11.6–15.2)

## 2013-12-18 MED ORDER — WARFARIN SODIUM 2 MG PO TABS: 2.0000 mg | ORAL_TABLET | Freq: Once | ORAL | Status: DC

## 2013-12-18 MED ORDER — SENNA 8.6 MG PO TABS: 1.0000 | ORAL_TABLET | Freq: Every day | ORAL | Status: DC

## 2013-12-18 MED ORDER — WARFARIN SODIUM 2 MG PO TABS
2.0000 mg | ORAL_TABLET | Freq: Once | ORAL | Status: DC
  Filled 2013-12-18: qty 1

## 2013-12-18 MED ORDER — ENSURE COMPLETE PO LIQD: 237.0000 mL | Freq: Two times a day (BID) | ORAL | Status: DC

## 2013-12-18 NOTE — Progress Notes (Signed)
ANTICOAGULATION CONSULT NOTE - Follow Up Consult  Pharmacy Consult for Heparin and Coumadin Indication: DVT  No Known Allergies  Patient Measurements: Height: 5\' 4"  (162.6 cm) Weight: 82 lb 14.4 oz (37.603 kg) IBW/kg (Calculated) : 54.7  Vital Signs: Temp: 98.3 F (36.8 C) (12/08 0516) Temp Source: Oral (12/08 0516) BP: 144/68 mmHg (12/08 0516) Pulse Rate: 60 (12/08 0516)  Labs:  Recent Labs  12/16/13 0530 12/16/13 1114 12/17/13 0325 12/18/13 0500 12/18/13 0613  HGB  --   --  9.0*  --  9.0*  HCT  --   --  26.4*  --  27.3*  PLT  --   --  163  --  171  LABPROT 15.6*  --  19.3*  --  24.8*  INR 1.23  --  1.61*  --  2.22*  HEPARINUNFRC 0.50  --  0.40 0.48  --   CREATININE  --  2.59*  --   --   --     Estimated Creatinine Clearance: 10.6 mL/min (by C-G formula based on Cr of 2.59).   Medications:  Heparin 700 units/hr  Assessment: 80yof on heparin bridging to Coumadin (Day 6 overlap) for acute DVT. Heparin level (0.48) remains therapeutic - INR is therapeutic and responded significantly overnight. - H/H and Plts stable - No significant bleeding reported  Goal of Therapy:  INR 2-3 Heparin level 0.3-0.7 units/ml Monitor platelets by anticoagulation protocol: Yes   Plan:  D/c heparin?  Coumadin 2 mg po today Follow-up daily heparin level, CBC and INR  Candela Krul Poteet   12/18/2013,9:05 AM

## 2013-12-18 NOTE — Progress Notes (Signed)
Assessment unchanged. Discussed D/C instructions with pt including f/u appointments, new medications, and dietary and bleeding precautions with Coumadin. Verbalized understanding. RX given to pt. IV and tele removed. Pt left with belongings accompanied by NT.

## 2013-12-18 NOTE — Care Management Note (Addendum)
    Page 2 of 2   12/18/2013     11:49:42 AM CARE MANAGEMENT NOTE 12/18/2013  Patient:  [name] Alexander,[name] Alexander   Account Number:  [id number]  Date Initiated:  12/14/2013  Documentation initiated by:  [name] Alexander,[name] Alexander  Subjective/Objective Assessment:   Pt admitted with bil. DVTs and CHF     Action/Plan:   PTA pt lived at home alone   Anticipated DC Date:  12/17/2013   Anticipated DC Plan:  HOME W HOME HEALTH SERVICES      DC Planning Services  CM consult      [facility]   Choice offered to / List presented to:  [name]        HH arranged  HH-1 RN  HH-2 PT      [facility]  [facility]   Status of service:  Completed, signed off Medicare Important Message given?  YES (If response is "NO", the following Medicare IM given date fields will be blank) Date Medicare IM given:  12/14/2013 Medicare IM given by:  [name] Alexander,[name] Alexander Date Additional Medicare IM given:  12/17/2013 Additional Medicare IM given by:  [name] Alexander  Discharge [facility]position:  [facility]  Per UR Regulation:  Reviewed for med. necessity/level of care/duration of stay  If discussed at Long Length of Stay Meetings, dates discussed:   12/18/2013    Comments:  12/18/13- 1100- [name] PieriniKristi Demontre Padin RN, BSN [phone] Per Ayesha RumpfLLOS mtg pt determined to be [facility]- [facility] and wishes to remain with them - [facility] [place] [facility] spoke with Amy who is aware that [facility] orders for RN/PT- pt to f/u with PCP on 12/20/13 for INR check. Pt has church member - [name] AmorCynthia [phone]- [phone]- that can be called if needed to come assist with clarifying info with pt- pt gave permission for her to be contacted if needed.  12/14/13- 1400- [name] PieriniKristi Zolton Dowson RN, BSN [phone] Spoke with pt at bedside- per conversation pt lives alone but has assistance by a close friend that is like family- [facility]- awaiting  orders- will need RN and PT. NCM to continue to follow.

## 2013-12-18 NOTE — Plan of Care (Signed)
Problem: Consults Goal: Heart Failure Patient Education (See Patient Education module for education specifics.)  Outcome: Completed/Met Date Met:  12/18/13  Problem: Discharge Progression Outcomes Goal: Barriers To Progression Addressed/Resolved Outcome: Not Applicable Date Met:  46/19/01 Goal: Able to perform self care activities Outcome: Completed/Met Date Met:  12/18/13 Goal: Discharge plan in place and appropriate Outcome: Completed/Met Date Met:  12/18/13 Goal: If EF < 40% ACEI/ARB addressed at discharge Outcome: Completed/Met Date Met:  12/18/13 Goal: Pain controlled with appropriate interventions Outcome: Adequate for Discharge Goal: Complications resolved/controlled Outcome: Completed/Met Date Met:  12/18/13 Goal: Tolerating diet Outcome: Completed/Met Date Met:  12/18/13 Goal: Activity appropriate for discharge plan Outcome: Completed/Met Date Met:  12/18/13 Goal: Other Discharge Outcomes/Goals Outcome: Completed/Met Date Met:  12/18/13  Problem: Phase II Progression Outcomes Goal: Therapeutic drug levels for anticoagulation Outcome: Completed/Met Date Met:  12/18/13 Goal: Discharge plan established Outcome: Completed/Met Date Met:  12/18/13 Goal: Other Phase II Outcomes/Goals Outcome: Completed/Met Date Met:  12/18/13  Problem: Discharge Progression Outcomes Goal: Barriers To Progression Addressed/Resolved Outcome: Completed/Met Date Met:  12/18/13 Goal: Discharge plan in place and appropriate Outcome: Completed/Met Date Met:  12/18/13 Goal: Pain controlled with appropriate interventions Outcome: Completed/Met Date Met:  12/18/13 Goal: Hemodynamically stable Outcome: Completed/Met Date Met:  22/24/11 Goal: Complications resolved/controlled Outcome: Completed/Met Date Met:  12/18/13 Goal: Tolerating diet Outcome: Completed/Met Date Met:  12/18/13 Goal: Activity appropriate for discharge plan Outcome: Completed/Met Date Met:  12/18/13 Goal: Other Discharge  Outcomes/Goals Outcome: Completed/Met Date Met:  12/18/13

## 2013-12-18 NOTE — Discharge Summary (Signed)
Physician Discharge Summary  [name] CrutchLillie Bellissimo MVH:[id number]:[id number] DOB: 06/23/1933 DOA: 12/11/2013  PCP: Laurena SlimmerLARK,[name], MD  Admit date: 12/11/2013 Discharge date: 12/18/2013  Time spent: 30 minutes  Recommendations for Outpatient Follow-up:  [facility]. Patient being discharged on coumadin for b/l DVT. Pt has appointment with PCP on 12/10.   Discharge Diagnoses:  Principal Problem:   DVT, bilateral lower limbs  Active Problems:   Hypertension   Gout Diastolic CHF   Hyperthyroidism   CKD (chronic kidney disease) stage 4, GFR 15-29 ml/min   Normocytic anemia   Edema   Discharge Condition: FAIR  Diet recommendation: *regular  Filed Weights   12/16/13 0700 12/17/13 0344 12/18/13 0516  Weight: 38 kg (83 lb 12.4 oz) 40.824 kg (90 lb) 37.603 kg (82 lb 14.4 oz)    History of present illness:  Please refer to admission H&P for details, but in brief, 80 year old female with history of chronic kidney disease stage III, chronic anemia, hypothyroidism, hypertension and gout but the ED by her family with increasing lower extremity edema with difficulty ambulating. She denies any chest pain or shortness of breath. Patient found to have bilateral lower extremity DVT.  Hospital Course:  Bilateral lower leg DVT No clear etiology. Patient reports being very functional at home. Placed on IV heparin and Coumadin. INR slowly improved to therapeutic today.  cannot use Lovenox or new anticoagulants due to poor renal function. will be discharged on coumadin with PCP follow up in 2 days.  CT of the abdomen negative for any obstruction or mass. No dyspnea or chest discomfort. Patient will need at least 6 months of anticoagulation with coumadin. Arranged HH upon d/c.   Chronic kidney disease stage IV ARB and HCTZ disconitnued. Her renal function seems to have been fairly stable with minimal progression for the past 2 years. Avoid nephrotoxins. BP stable.  Mild CHF with bilateral lower leg  edema Elevated proBNP on admission with significant bilateral lower leg edema. Patient placed on daily Lasix with good improvement. 2D Echo shows EF of 50% with LV diastolic dysfunction. Now euvolemic. Does not need lasix upon discharge.   Hypertension Blood pressure stable. Continue amlodipine . Marland Kitchen. Holding HCTZ and ARB  Anemia  likely of chronic disease. Follow up as outpt   Protein calorie malnutrition and weight loss - reports significant weight loss over the past 6 months. CT of the abdomen unremarkable. CA 125 and CEA negative. LFTs normal. Patient has history of heavy smoking in the past. Further w/up as outpt Seen by nutritionist. Added ensure BID.  GERD Continue PPI  Hyperthyroidism Continue Tapazole. TSH normal  Chronic gout Stable. Continue allopurinol and when necessary tramadol.   Constipation on sennakot     Diet: Regular  Code Status: Full code Family Communication: None at bedside Disposition Plan: Home once INR therapeutic on Coumadin, possibly tomorrow   Consultants:  None  Procedures:  CT abdomen  Doppler lower extremities  Antibiotics:  None  Discharge Exam: Filed Vitals:   12/18/13 1007  BP: 113/62  Pulse:   Temp:   Resp:     General:Elderly thin built female in  no acute distress  HEENT:no pallor,  moist oral mucosa,  Cardiovascular: Normal S1 and S2, no murmurs  Chest: Clear to auscultation bilaterally,   Abdomen: Soft, nondistended, nontender,  Extremities: Warm, no edema  CNS: alert and oriented   Discharge Instructions You were cared for by a hospitalist during your hospital stay. If you have any questions about your  discharge medications or the care you received while you were in the hospital after you are discharged, you can call the unit and asked to speak with the hospitalist on call if the hospitalist that took care of you is not available. Once you are discharged, your primary care physician will handle any  further medical issues. Please note that NO REFILLS for any discharge medications will be authorized once you are discharged, as it is imperative that you return to your primary care physician (or establish a relationship with a primary care physician if you do not have one) for your aftercare needs so that they can reassess your need for medications and monitor your lab values.   Current Discharge Medication List    START taking these medications   Details  feeding supplement, ENSURE COMPLETE, (ENSURE COMPLETE) LIQD Take 237 mLs by mouth 2 (two) times daily between meals. Qty: 60 Bottle, Refills: 0    senna (SENOKOT) 8.6 MG TABS tablet Take 1 tablet (8.6 mg total) by mouth daily. Qty: 30 each, Refills: 0    warfarin (COUMADIN) 2 MG tablet Take 1 tablet (2 mg total) by mouth one time only at 6 PM. Qty: 20 tablet, Refills: 0      CONTINUE these medications which have NOT CHANGED   Details  allopurinol (ZYLOPRIM) 100 MG tablet Take 100 mg by mouth every morning.    amLODipine (NORVASC) 5 MG tablet Take 5 mg by mouth daily.    methimazole (TAPAZOLE) 5 MG tablet Take 5 mg by mouth every morning.     omeprazole (PRILOSEC) 40 MG capsule Take 1 capsule by mouth 2 (two) times daily.    traMADol (ULTRAM) 50 MG tablet Take 50-100 mg by mouth every 6 (six) hours as needed for moderate pain.       STOP taking these medications     hydrochlorothiazide (MICROZIDE) 12.5 MG capsule      HYDROcodone-acetaminophen (NORCO) 10-325 MG per tablet      amoxicillin-clavulanate (AUGMENTIN) 875-125 MG per tablet      valsartan-hydrochlorothiazide (DIOVAN-HCT) 160-12.5 MG per tablet        No Known Allergies Follow-up Information    Follow up with [facility].   Contact information:   [address] [place] [address] [phone]       Follow up with [name], MD On 12/20/2013.   Specialty:  Internal Medicine   Why:  PT Needs to be seen by PCP within one  week Appt. is for Thursday @2 :30pm   Contact information:   [address] [place] [address] [phone]        The results of significant diagnostics from this hospitalization (including imaging, microbiology, ancillary and laboratory) are listed below for reference.    Significant Diagnostic Studies: Ct Abdomen Pelvis Wo Contrast  12/13/2013   CLINICAL DATA:  Abnormal weight loss.  EXAM: CT ABDOMEN AND PELVIS WITHOUT CONTRAST  TECHNIQUE: Multidetector CT imaging of the abdomen and pelvis was performed following the standard protocol without IV contrast.  COMPARISON:  CT abdomen pelvis 04/26/2013  FINDINGS: Lung bases are clear. Heart size is mildly enlarged. Atherosclerotic calcification is present diffusely without aortic aneurysm. Abdominal aorta measures 2.5 cm in diameter.  Unenhanced images of the liver reveal no mass lesions. Numerous small calcified gallstones are present. Gallbladder wall not thickened. Bile ducts nondilated. Normal spleen. Normal pancreas.  Negative for renal obstruction. Renal artery calcifications are noted bilaterally. 2 mm nonobstructing calculus in the right  upper pole. 8 mm hyperdense cyst in the left lower pole not visualized on the prior study. 15 mm slightly hyperdense nodule left upper pole unchanged from the prior CT.  Negative for bowel obstruction. No bowel thickening. There is a large amount of retained stool throughout the colon. Appendix not visualized.  No free fluid.  Negative for mass or adenopathy in the abdomen.  Grade 1 slip L3-4 and L4-5 unchanged from the prior study. Bones are diffusely sclerotic which may reflect metabolic bone disease.  IMPRESSION: Constipation without bowel obstruction.  Cholelithiasis   Electronically Signed   By: Marlan Palauharles  Clark M.D.   On: 12/13/2013 07:31   Dg Chest 2 View  12/12/2013   CLINICAL DATA:  Shortness of breath.  Initial encounter.  EXAM: CHEST  2 VIEW  COMPARISON:  04/23/2013 and prior  radiographs. 04/30/2013 and prior CTs  FINDINGS: Cardiomegaly again identified.  The mediastinal silhouette is unchanged.  Mild peribronchial thickening again noted.  There is no evidence of focal airspace disease, pulmonary edema, suspicious pulmonary nodule/mass, pleural effusion, or pneumothorax. No acute bony abnormalities are identified.  IMPRESSION: Cardiomegaly without evidence of acute cardiopulmonary disease.   Electronically Signed   By: Laveda AbbeJeff  Hu M.D.   On: 12/12/2013 01:02    Microbiology: No results found for this or any previous visit (from the past 240 hour(s)).   Labs: Basic Metabolic Panel:  Recent Labs Lab 12/11/13 2359 12/12/13 0746 12/13/13 0720 12/14/13 0330 12/16/13 1114  NA 138 141 137 132* 136*  K 4.3 3.9 4.2 4.1 4.1  CL 103 105 100 96 100  CO2 19 23 23  18* 24  GLUCOSE 110* 87 86 82 95  BUN 74* 72* 69* 68* 60*  CREATININE 2.78* 2.70* 2.51* 2.59* 2.59*  CALCIUM 10.2 10.5 10.6* 10.0 10.9*  PHOS  --  3.9  --   --   --    Liver Function Tests:  Recent Labs Lab 12/11/13 2359 12/12/13 0746  AST 18 17  ALT 14 13  ALKPHOS 103 103  BILITOT <0.2* 0.3  PROT 6.7 6.6  ALBUMIN 3.7 3.7   No results for input(s): LIPASE, AMYLASE in the last 168 hours. No results for input(s): AMMONIA in the last 168 hours. CBC:  Recent Labs Lab 12/11/13 2359 12/12/13 0746 12/13/13 0720 12/14/13 0330 12/15/13 0440 12/17/13 0325 12/18/13 0613  WBC 4.9 4.8 4.3 4.4 4.1 4.3 4.2  NEUTROABS 2.5 2.3  --   --   --   --   --   HGB 9.3* 9.6* 9.2* 8.6* 9.2* 9.0* 9.0*  HCT 27.2* 28.9* 27.5* 25.2* 27.6* 26.4* 27.3*  MCV 99.3 96.7 96.8 97.7 97.5 101.1* 98.2  PLT 137* 140* 135* 135* 167 163 171   Cardiac Enzymes: No results for input(s): CKTOTAL, CKMB, CKMBINDEX, TROPONINI in the last 168 hours. BNP: BNP (last 3 results)  Recent Labs  12/11/13 2359 12/12/13 0746  PROBNP 1367.0* 1267.0*   CBG: No results for input(s): GLUCAP in the last 168  hours.     SignedEddie North:  Circe Chilton  [facility] 12/18/2013, 10:34 AM

## 2014-01-02 ENCOUNTER — Other Ambulatory Visit: Payer: Self-pay | Admitting: Internal Medicine

## 2014-01-07 ENCOUNTER — Other Ambulatory Visit: Payer: Self-pay | Admitting: Internal Medicine

## 2014-01-14 ENCOUNTER — Other Ambulatory Visit: Payer: Self-pay | Admitting: Internal Medicine

## 2014-06-12 DIAGNOSIS — J449 Chronic obstructive pulmonary disease, unspecified: Secondary | ICD-10-CM | POA: Diagnosis not present

## 2014-06-12 DIAGNOSIS — E0520 Thyrotoxicosis with toxic multinodular goiter without thyrotoxic crisis or storm: Secondary | ICD-10-CM | POA: Diagnosis not present

## 2014-06-12 DIAGNOSIS — M150 Primary generalized (osteo)arthritis: Secondary | ICD-10-CM | POA: Diagnosis not present

## 2014-06-12 DIAGNOSIS — D649 Anemia, unspecified: Secondary | ICD-10-CM | POA: Diagnosis not present

## 2014-06-13 DIAGNOSIS — E0520 Thyrotoxicosis with toxic multinodular goiter without thyrotoxic crisis or storm: Secondary | ICD-10-CM | POA: Diagnosis not present

## 2014-06-13 DIAGNOSIS — J449 Chronic obstructive pulmonary disease, unspecified: Secondary | ICD-10-CM | POA: Diagnosis not present

## 2014-06-13 DIAGNOSIS — M150 Primary generalized (osteo)arthritis: Secondary | ICD-10-CM | POA: Diagnosis not present

## 2014-06-13 DIAGNOSIS — D649 Anemia, unspecified: Secondary | ICD-10-CM | POA: Diagnosis not present

## 2014-06-14 DIAGNOSIS — E0520 Thyrotoxicosis with toxic multinodular goiter without thyrotoxic crisis or storm: Secondary | ICD-10-CM | POA: Diagnosis not present

## 2014-06-14 DIAGNOSIS — J449 Chronic obstructive pulmonary disease, unspecified: Secondary | ICD-10-CM | POA: Diagnosis not present

## 2014-06-14 DIAGNOSIS — D649 Anemia, unspecified: Secondary | ICD-10-CM | POA: Diagnosis not present

## 2014-06-14 DIAGNOSIS — M150 Primary generalized (osteo)arthritis: Secondary | ICD-10-CM | POA: Diagnosis not present

## 2014-06-19 DIAGNOSIS — D649 Anemia, unspecified: Secondary | ICD-10-CM | POA: Diagnosis not present

## 2014-06-19 DIAGNOSIS — J449 Chronic obstructive pulmonary disease, unspecified: Secondary | ICD-10-CM | POA: Diagnosis not present

## 2014-06-19 DIAGNOSIS — M150 Primary generalized (osteo)arthritis: Secondary | ICD-10-CM | POA: Diagnosis not present

## 2014-06-19 DIAGNOSIS — E0520 Thyrotoxicosis with toxic multinodular goiter without thyrotoxic crisis or storm: Secondary | ICD-10-CM | POA: Diagnosis not present

## 2014-06-20 DIAGNOSIS — J449 Chronic obstructive pulmonary disease, unspecified: Secondary | ICD-10-CM | POA: Diagnosis not present

## 2014-06-20 DIAGNOSIS — D649 Anemia, unspecified: Secondary | ICD-10-CM | POA: Diagnosis not present

## 2014-06-20 DIAGNOSIS — E0520 Thyrotoxicosis with toxic multinodular goiter without thyrotoxic crisis or storm: Secondary | ICD-10-CM | POA: Diagnosis not present

## 2014-06-20 DIAGNOSIS — M150 Primary generalized (osteo)arthritis: Secondary | ICD-10-CM | POA: Diagnosis not present

## 2014-06-21 DIAGNOSIS — M150 Primary generalized (osteo)arthritis: Secondary | ICD-10-CM | POA: Diagnosis not present

## 2014-06-21 DIAGNOSIS — J449 Chronic obstructive pulmonary disease, unspecified: Secondary | ICD-10-CM | POA: Diagnosis not present

## 2014-06-21 DIAGNOSIS — D649 Anemia, unspecified: Secondary | ICD-10-CM | POA: Diagnosis not present

## 2014-06-21 DIAGNOSIS — E0520 Thyrotoxicosis with toxic multinodular goiter without thyrotoxic crisis or storm: Secondary | ICD-10-CM | POA: Diagnosis not present

## 2014-06-24 DIAGNOSIS — D649 Anemia, unspecified: Secondary | ICD-10-CM | POA: Diagnosis not present

## 2014-06-24 DIAGNOSIS — J449 Chronic obstructive pulmonary disease, unspecified: Secondary | ICD-10-CM | POA: Diagnosis not present

## 2014-06-24 DIAGNOSIS — M150 Primary generalized (osteo)arthritis: Secondary | ICD-10-CM | POA: Diagnosis not present

## 2014-06-24 DIAGNOSIS — E0520 Thyrotoxicosis with toxic multinodular goiter without thyrotoxic crisis or storm: Secondary | ICD-10-CM | POA: Diagnosis not present

## 2014-06-25 DIAGNOSIS — J449 Chronic obstructive pulmonary disease, unspecified: Secondary | ICD-10-CM | POA: Diagnosis not present

## 2014-06-25 DIAGNOSIS — D649 Anemia, unspecified: Secondary | ICD-10-CM | POA: Diagnosis not present

## 2014-06-25 DIAGNOSIS — M150 Primary generalized (osteo)arthritis: Secondary | ICD-10-CM | POA: Diagnosis not present

## 2014-06-25 DIAGNOSIS — E0520 Thyrotoxicosis with toxic multinodular goiter without thyrotoxic crisis or storm: Secondary | ICD-10-CM | POA: Diagnosis not present

## 2014-06-27 DIAGNOSIS — M150 Primary generalized (osteo)arthritis: Secondary | ICD-10-CM | POA: Diagnosis not present

## 2014-06-27 DIAGNOSIS — E0520 Thyrotoxicosis with toxic multinodular goiter without thyrotoxic crisis or storm: Secondary | ICD-10-CM | POA: Diagnosis not present

## 2014-06-27 DIAGNOSIS — D649 Anemia, unspecified: Secondary | ICD-10-CM | POA: Diagnosis not present

## 2014-06-27 DIAGNOSIS — J449 Chronic obstructive pulmonary disease, unspecified: Secondary | ICD-10-CM | POA: Diagnosis not present

## 2014-06-28 DIAGNOSIS — J449 Chronic obstructive pulmonary disease, unspecified: Secondary | ICD-10-CM | POA: Diagnosis not present

## 2014-06-28 DIAGNOSIS — E0520 Thyrotoxicosis with toxic multinodular goiter without thyrotoxic crisis or storm: Secondary | ICD-10-CM | POA: Diagnosis not present

## 2014-06-28 DIAGNOSIS — D649 Anemia, unspecified: Secondary | ICD-10-CM | POA: Diagnosis not present

## 2014-06-28 DIAGNOSIS — M150 Primary generalized (osteo)arthritis: Secondary | ICD-10-CM | POA: Diagnosis not present

## 2014-07-02 DIAGNOSIS — M25552 Pain in left hip: Secondary | ICD-10-CM | POA: Diagnosis not present

## 2014-07-02 DIAGNOSIS — R269 Unspecified abnormalities of gait and mobility: Secondary | ICD-10-CM | POA: Diagnosis not present

## 2014-07-02 DIAGNOSIS — R5381 Other malaise: Secondary | ICD-10-CM | POA: Diagnosis not present

## 2014-07-02 DIAGNOSIS — M129 Arthropathy, unspecified: Secondary | ICD-10-CM | POA: Diagnosis not present

## 2014-07-22 DIAGNOSIS — J449 Chronic obstructive pulmonary disease, unspecified: Secondary | ICD-10-CM | POA: Diagnosis not present

## 2014-07-22 DIAGNOSIS — E0520 Thyrotoxicosis with toxic multinodular goiter without thyrotoxic crisis or storm: Secondary | ICD-10-CM | POA: Diagnosis not present

## 2014-07-22 DIAGNOSIS — I10 Essential (primary) hypertension: Secondary | ICD-10-CM | POA: Diagnosis not present

## 2014-07-22 DIAGNOSIS — Z7901 Long term (current) use of anticoagulants: Secondary | ICD-10-CM | POA: Diagnosis not present

## 2014-07-22 DIAGNOSIS — E049 Nontoxic goiter, unspecified: Secondary | ICD-10-CM | POA: Diagnosis not present

## 2014-09-02 DIAGNOSIS — M25552 Pain in left hip: Secondary | ICD-10-CM | POA: Diagnosis not present

## 2014-09-02 DIAGNOSIS — M25521 Pain in right elbow: Secondary | ICD-10-CM | POA: Diagnosis not present

## 2014-09-02 DIAGNOSIS — M25562 Pain in left knee: Secondary | ICD-10-CM | POA: Diagnosis not present

## 2014-09-02 DIAGNOSIS — M25522 Pain in left elbow: Secondary | ICD-10-CM | POA: Diagnosis not present

## 2014-11-27 DIAGNOSIS — E049 Nontoxic goiter, unspecified: Secondary | ICD-10-CM | POA: Diagnosis not present

## 2014-11-27 DIAGNOSIS — Z7901 Long term (current) use of anticoagulants: Secondary | ICD-10-CM | POA: Diagnosis not present

## 2014-11-27 DIAGNOSIS — I10 Essential (primary) hypertension: Secondary | ICD-10-CM | POA: Diagnosis not present

## 2014-11-27 DIAGNOSIS — J449 Chronic obstructive pulmonary disease, unspecified: Secondary | ICD-10-CM | POA: Diagnosis not present

## 2014-11-28 DIAGNOSIS — Z23 Encounter for immunization: Secondary | ICD-10-CM | POA: Diagnosis not present

## 2014-12-25 DIAGNOSIS — M25562 Pain in left knee: Secondary | ICD-10-CM | POA: Diagnosis not present

## 2014-12-25 DIAGNOSIS — M25552 Pain in left hip: Secondary | ICD-10-CM | POA: Diagnosis not present

## 2015-02-26 DIAGNOSIS — E049 Nontoxic goiter, unspecified: Secondary | ICD-10-CM | POA: Diagnosis not present

## 2015-02-26 DIAGNOSIS — I10 Essential (primary) hypertension: Secondary | ICD-10-CM | POA: Diagnosis not present

## 2015-02-26 DIAGNOSIS — E538 Deficiency of other specified B group vitamins: Secondary | ICD-10-CM | POA: Diagnosis not present

## 2015-02-26 DIAGNOSIS — J449 Chronic obstructive pulmonary disease, unspecified: Secondary | ICD-10-CM | POA: Diagnosis not present

## 2015-02-26 DIAGNOSIS — D649 Anemia, unspecified: Secondary | ICD-10-CM | POA: Diagnosis not present

## 2015-02-26 DIAGNOSIS — E042 Nontoxic multinodular goiter: Secondary | ICD-10-CM | POA: Diagnosis not present

## 2015-03-18 ENCOUNTER — Emergency Department (HOSPITAL_COMMUNITY): Payer: Medicare Other

## 2015-03-18 ENCOUNTER — Inpatient Hospital Stay (HOSPITAL_COMMUNITY)
Admission: EM | Admit: 2015-03-18 | Discharge: 2015-03-20 | DRG: 190 | Disposition: A | Discharge status: Home / Self Care | Payer: Medicare Other | Attending: Internal Medicine | Admitting: Internal Medicine

## 2015-03-18 ENCOUNTER — Encounter (HOSPITAL_COMMUNITY): Payer: Self-pay | Admitting: *Deleted

## 2015-03-18 DIAGNOSIS — M109 Gout, unspecified: Secondary | ICD-10-CM | POA: Diagnosis present

## 2015-03-18 DIAGNOSIS — I82403 Acute embolism and thrombosis of unspecified deep veins of lower extremity, bilateral: Secondary | ICD-10-CM | POA: Diagnosis not present

## 2015-03-18 DIAGNOSIS — J9601 Acute respiratory failure with hypoxia: Secondary | ICD-10-CM | POA: Diagnosis present

## 2015-03-18 DIAGNOSIS — N184 Chronic kidney disease, stage 4 (severe): Secondary | ICD-10-CM | POA: Diagnosis not present

## 2015-03-18 DIAGNOSIS — I5032 Chronic diastolic (congestive) heart failure: Secondary | ICD-10-CM | POA: Diagnosis present

## 2015-03-18 DIAGNOSIS — E0590 Thyrotoxicosis, unspecified without thyrotoxic crisis or storm: Secondary | ICD-10-CM | POA: Diagnosis not present

## 2015-03-18 DIAGNOSIS — E872 Acidosis: Secondary | ICD-10-CM | POA: Diagnosis present

## 2015-03-18 DIAGNOSIS — J441 Chronic obstructive pulmonary disease with (acute) exacerbation: Principal | ICD-10-CM | POA: Diagnosis present

## 2015-03-18 DIAGNOSIS — D696 Thrombocytopenia, unspecified: Secondary | ICD-10-CM | POA: Diagnosis present

## 2015-03-18 DIAGNOSIS — Z7901 Long term (current) use of anticoagulants: Secondary | ICD-10-CM | POA: Diagnosis not present

## 2015-03-18 DIAGNOSIS — R0600 Dyspnea, unspecified: Secondary | ICD-10-CM

## 2015-03-18 DIAGNOSIS — R609 Edema, unspecified: Secondary | ICD-10-CM

## 2015-03-18 DIAGNOSIS — R350 Frequency of micturition: Secondary | ICD-10-CM | POA: Diagnosis present

## 2015-03-18 DIAGNOSIS — J9801 Acute bronchospasm: Secondary | ICD-10-CM | POA: Diagnosis not present

## 2015-03-18 DIAGNOSIS — R0602 Shortness of breath: Secondary | ICD-10-CM | POA: Diagnosis not present

## 2015-03-18 DIAGNOSIS — E43 Unspecified severe protein-calorie malnutrition: Secondary | ICD-10-CM | POA: Insufficient documentation

## 2015-03-18 DIAGNOSIS — F17210 Nicotine dependence, cigarettes, uncomplicated: Secondary | ICD-10-CM | POA: Diagnosis present

## 2015-03-18 DIAGNOSIS — I10 Essential (primary) hypertension: Secondary | ICD-10-CM

## 2015-03-18 DIAGNOSIS — I82409 Acute embolism and thrombosis of unspecified deep veins of unspecified lower extremity: Secondary | ICD-10-CM | POA: Diagnosis not present

## 2015-03-18 DIAGNOSIS — R531 Weakness: Secondary | ICD-10-CM | POA: Diagnosis not present

## 2015-03-18 DIAGNOSIS — K5900 Constipation, unspecified: Secondary | ICD-10-CM | POA: Diagnosis present

## 2015-03-18 DIAGNOSIS — I129 Hypertensive chronic kidney disease with stage 1 through stage 4 chronic kidney disease, or unspecified chronic kidney disease: Secondary | ICD-10-CM | POA: Diagnosis not present

## 2015-03-18 DIAGNOSIS — R0682 Tachypnea, not elsewhere classified: Secondary | ICD-10-CM

## 2015-03-18 DIAGNOSIS — R404 Transient alteration of awareness: Secondary | ICD-10-CM | POA: Diagnosis not present

## 2015-03-18 DIAGNOSIS — Z720 Tobacco use: Secondary | ICD-10-CM

## 2015-03-18 DIAGNOSIS — R109 Unspecified abdominal pain: Secondary | ICD-10-CM

## 2015-03-18 LAB — I-STAT ARTERIAL BLOOD GAS, ED
Acid-base deficit: 6 mmol/L — ABNORMAL HIGH (ref 0.0–2.0)
Bicarbonate: 16.7 mEq/L — ABNORMAL LOW (ref 20.0–24.0)
O2 Saturation: 84 %
PCO2 ART: 26 mmHg — AB (ref 35.0–45.0)
PH ART: 7.415 (ref 7.350–7.450)
PO2 ART: 46 mmHg — AB (ref 80.0–100.0)
Patient temperature: 98.6
TCO2: 17 mmol/L (ref 0–100)

## 2015-03-18 LAB — TSH: TSH: 0.871 u[IU]/mL (ref 0.350–4.500)

## 2015-03-18 LAB — BRAIN NATRIURETIC PEPTIDE: B Natriuretic Peptide: 571.4 pg/mL — ABNORMAL HIGH (ref 0.0–100.0)

## 2015-03-18 LAB — URINALYSIS, ROUTINE W REFLEX MICROSCOPIC
BILIRUBIN URINE: NEGATIVE
GLUCOSE, UA: NEGATIVE mg/dL
Ketones, ur: NEGATIVE mg/dL
Leukocytes, UA: NEGATIVE
Nitrite: NEGATIVE
PH: 6.5 (ref 5.0–8.0)
Protein, ur: >300 mg/dL — AB
SPECIFIC GRAVITY, URINE: 1.014 (ref 1.005–1.030)

## 2015-03-18 LAB — CBC WITH DIFFERENTIAL/PLATELET
BASOS PCT: 1 %
Basophils Absolute: 0 10*3/uL (ref 0.0–0.1)
EOS PCT: 0 %
Eosinophils Absolute: 0 10*3/uL (ref 0.0–0.7)
HEMATOCRIT: 37.3 % (ref 36.0–46.0)
Hemoglobin: 12.1 g/dL (ref 12.0–15.0)
LYMPHS ABS: 1.1 10*3/uL (ref 0.7–4.0)
Lymphocytes Relative: 31 %
MCH: 33.2 pg (ref 26.0–34.0)
MCHC: 32.4 g/dL (ref 30.0–36.0)
MCV: 102.2 fL — AB (ref 78.0–100.0)
MONOS PCT: 6 %
Monocytes Absolute: 0.2 10*3/uL (ref 0.1–1.0)
Neutro Abs: 2.3 10*3/uL (ref 1.7–7.7)
Neutrophils Relative %: 62 %
Platelets: 144 10*3/uL — ABNORMAL LOW (ref 150–400)
RBC: 3.65 MIL/uL — AB (ref 3.87–5.11)
RDW: 13.9 % (ref 11.5–15.5)
WBC: 3.6 10*3/uL — AB (ref 4.0–10.5)

## 2015-03-18 LAB — COMPREHENSIVE METABOLIC PANEL
ALT: 16 U/L (ref 14–54)
AST: 31 U/L (ref 15–41)
Albumin: 3.7 g/dL (ref 3.5–5.0)
Alkaline Phosphatase: 124 U/L (ref 38–126)
Anion gap: 9 (ref 5–15)
BILIRUBIN TOTAL: 0.5 mg/dL (ref 0.3–1.2)
BUN: 30 mg/dL — AB (ref 6–20)
CO2: 17 mmol/L — ABNORMAL LOW (ref 22–32)
CREATININE: 1.99 mg/dL — AB (ref 0.44–1.00)
Calcium: 10.4 mg/dL — ABNORMAL HIGH (ref 8.9–10.3)
Chloride: 115 mmol/L — ABNORMAL HIGH (ref 101–111)
GFR calc Af Amer: 26 mL/min — ABNORMAL LOW (ref >60)
GFR, EST NON AFRICAN AMERICAN: 23 mL/min — AB (ref >60)
Glucose, Bld: 123 mg/dL — ABNORMAL HIGH (ref 65–99)
POTASSIUM: 4.3 mmol/L (ref 3.5–5.1)
Sodium: 141 mmol/L (ref 135–145)
TOTAL PROTEIN: 7.1 g/dL (ref 6.5–8.1)

## 2015-03-18 LAB — URINE MICROSCOPIC-ADD ON: BACTERIA UA: NONE SEEN

## 2015-03-18 LAB — I-STAT TROPONIN, ED: TROPONIN I, POC: 0.05 ng/mL (ref 0.00–0.08)

## 2015-03-18 LAB — D-DIMER, QUANTITATIVE: D-Dimer, Quant: 3.59 ug/mL-FEU — ABNORMAL HIGH (ref 0.00–0.50)

## 2015-03-18 LAB — PROTIME-INR
INR: 1.02 (ref 0.00–1.49)
Prothrombin Time: 13.6 seconds (ref 11.6–15.2)

## 2015-03-18 LAB — I-STAT CG4 LACTIC ACID, ED
LACTIC ACID, VENOUS: 1.09 mmol/L (ref 0.5–2.0)
Lactic Acid, Venous: 0.58 mmol/L (ref 0.5–2.0)

## 2015-03-18 MED ORDER — METHIMAZOLE 5 MG PO TABS
5.0000 mg | ORAL_TABLET | Freq: Every morning | ORAL | Status: DC
  Administered 2015-03-19 – 2015-03-20 (×2): 5 mg via ORAL
  Filled 2015-03-18 (×5): qty 1

## 2015-03-18 MED ORDER — LORAZEPAM 0.5 MG PO TABS
0.5000 mg | ORAL_TABLET | Freq: Once | ORAL | Status: AC
  Administered 2015-03-18: 0.5 mg via ORAL
  Filled 2015-03-18: qty 1

## 2015-03-18 MED ORDER — ENOXAPARIN SODIUM 30 MG/0.3ML ~~LOC~~ SOLN
30.0000 mg | SUBCUTANEOUS | Status: DC
  Administered 2015-03-18: 30 mg via SUBCUTANEOUS
  Filled 2015-03-18: qty 0.3

## 2015-03-18 MED ORDER — ONDANSETRON HCL 4 MG PO TABS: 4.0000 mg | ORAL_TABLET | Freq: Four times a day (QID) | ORAL | Status: DC | PRN

## 2015-03-18 MED ORDER — TRAMADOL HCL 50 MG PO TABS
50.0000 mg | ORAL_TABLET | Freq: Four times a day (QID) | ORAL | Status: DC | PRN
  Administered 2015-03-18: 100 mg via ORAL
  Filled 2015-03-18 (×2): qty 2

## 2015-03-18 MED ORDER — IPRATROPIUM-ALBUTEROL 0.5-2.5 (3) MG/3ML IN SOLN
3.0000 mL | Freq: Once | RESPIRATORY_TRACT | Status: AC
  Administered 2015-03-18: 3 mL via RESPIRATORY_TRACT
  Filled 2015-03-18: qty 3

## 2015-03-18 MED ORDER — ACETAMINOPHEN 650 MG RE SUPP: 650.0000 mg | Freq: Four times a day (QID) | RECTAL | Status: DC | PRN

## 2015-03-18 MED ORDER — ALBUTEROL SULFATE (2.5 MG/3ML) 0.083% IN NEBU
2.5000 mg | INHALATION_SOLUTION | RESPIRATORY_TRACT | Status: DC | PRN
  Administered 2015-03-18: 2.5 mg via RESPIRATORY_TRACT
  Filled 2015-03-18: qty 3

## 2015-03-18 MED ORDER — SODIUM CHLORIDE 0.9 % IV SOLN
INTRAVENOUS | Status: DC
  Administered 2015-03-18: 1000 mL via INTRAVENOUS
  Administered 2015-03-19: 10:00:00 via INTRAVENOUS

## 2015-03-18 MED ORDER — METHYLPREDNISOLONE SODIUM SUCC 125 MG IJ SOLR
125.0000 mg | Freq: Once | INTRAMUSCULAR | Status: AC
  Administered 2015-03-18: 125 mg via INTRAVENOUS
  Filled 2015-03-18: qty 2

## 2015-03-18 MED ORDER — ACETAMINOPHEN 325 MG PO TABS
650.0000 mg | ORAL_TABLET | Freq: Four times a day (QID) | ORAL | Status: DC | PRN
  Filled 2015-03-18: qty 2

## 2015-03-18 MED ORDER — AMLODIPINE BESYLATE 5 MG PO TABS
5.0000 mg | ORAL_TABLET | Freq: Every day | ORAL | Status: DC
  Administered 2015-03-18 – 2015-03-20 (×3): 5 mg via ORAL
  Filled 2015-03-18 (×4): qty 1

## 2015-03-18 MED ORDER — HYDROCODONE-ACETAMINOPHEN 5-325 MG PO TABS
1.0000 | ORAL_TABLET | Freq: Four times a day (QID) | ORAL | Status: DC | PRN
  Administered 2015-03-18 – 2015-03-19 (×2): 1 via ORAL
  Filled 2015-03-18 (×2): qty 1

## 2015-03-18 MED ORDER — ALLOPURINOL 100 MG PO TABS
100.0000 mg | ORAL_TABLET | Freq: Every morning | ORAL | Status: DC
  Administered 2015-03-18 – 2015-03-20 (×3): 100 mg via ORAL
  Filled 2015-03-18 (×4): qty 1

## 2015-03-18 MED ORDER — TECHNETIUM TC 99M DIETHYLENETRIAME-PENTAACETIC ACID: 34.0000 | Freq: Once | INTRAVENOUS | Status: DC | PRN

## 2015-03-18 MED ORDER — TECHNETIUM TO 99M ALBUMIN AGGREGATED
4.1000 | Freq: Once | INTRAVENOUS | Status: AC | PRN
  Administered 2015-03-18: 4 via INTRAVENOUS

## 2015-03-18 MED ORDER — SENNA 8.6 MG PO TABS
1.0000 | ORAL_TABLET | Freq: Every day | ORAL | Status: DC
  Administered 2015-03-18 – 2015-03-20 (×3): 8.6 mg via ORAL
  Filled 2015-03-18 (×4): qty 1

## 2015-03-18 MED ORDER — DEXTROSE 5 % IV SOLN
500.0000 mg | INTRAVENOUS | Status: DC
  Administered 2015-03-18 – 2015-03-19 (×2): 500 mg via INTRAVENOUS
  Filled 2015-03-18 (×2): qty 500

## 2015-03-18 MED ORDER — DOCUSATE SODIUM 100 MG PO CAPS
100.0000 mg | ORAL_CAPSULE | Freq: Two times a day (BID) | ORAL | Status: DC
  Administered 2015-03-18 – 2015-03-20 (×4): 100 mg via ORAL
  Filled 2015-03-18 (×5): qty 1

## 2015-03-18 MED ORDER — METHYLPREDNISOLONE SODIUM SUCC 125 MG IJ SOLR
60.0000 mg | Freq: Two times a day (BID) | INTRAMUSCULAR | Status: DC
  Administered 2015-03-18 – 2015-03-20 (×4): 60 mg via INTRAVENOUS
  Filled 2015-03-18 (×5): qty 2

## 2015-03-18 MED ORDER — ONDANSETRON HCL 4 MG/2ML IJ SOLN: 4.0000 mg | Freq: Four times a day (QID) | INTRAMUSCULAR | Status: DC | PRN

## 2015-03-18 MED ORDER — IPRATROPIUM-ALBUTEROL 0.5-2.5 (3) MG/3ML IN SOLN
3.0000 mL | Freq: Four times a day (QID) | RESPIRATORY_TRACT | Status: DC
  Administered 2015-03-19 – 2015-03-20 (×6): 3 mL via RESPIRATORY_TRACT
  Filled 2015-03-18 (×7): qty 3

## 2015-03-18 MED ORDER — PANTOPRAZOLE SODIUM 40 MG PO TBEC
40.0000 mg | DELAYED_RELEASE_TABLET | Freq: Every day | ORAL | Status: DC
  Administered 2015-03-18 – 2015-03-20 (×3): 40 mg via ORAL
  Filled 2015-03-18 (×4): qty 1

## 2015-03-18 MED ORDER — POLYETHYLENE GLYCOL 3350 17 G PO PACK: 17.0000 g | PACK | Freq: Every day | ORAL | Status: DC | PRN

## 2015-03-18 MED ORDER — IPRATROPIUM-ALBUTEROL 0.5-2.5 (3) MG/3ML IN SOLN
3.0000 mL | RESPIRATORY_TRACT | Status: DC
  Administered 2015-03-18 (×2): 3 mL via RESPIRATORY_TRACT
  Filled 2015-03-18 (×2): qty 3

## 2015-03-18 MED ORDER — BOOST / RESOURCE BREEZE PO LIQD
237.0000 mL | Freq: Two times a day (BID) | ORAL | Status: DC
Start: 2015-03-19 — End: 2015-03-19
  Administered 2015-03-19 (×2): 1 via ORAL

## 2015-03-18 NOTE — ED Notes (Signed)
Pt transported to [facility] 

## 2015-03-18 NOTE — ED Notes (Signed)
Pt requesting pain medication for chronic arthritic hip pain. Dr. Effie ShyWentz informed of pain and of elevated D Dimer.

## 2015-03-18 NOTE — Progress Notes (Addendum)
ANTICOAGULATION CONSULT NOTE - Initial Consult  Pharmacy Consult for warfarin Indication: hx bilateral DVT (12/2013)  No Known Allergies  Patient Measurements:    Vital Signs: Temp: 97.6 F (36.4 C) (03/07 0952) Temp Source: Oral (03/07 0952) BP: 153/83 mmHg (03/07 1730) Pulse Rate: 98 (03/07 1730)  Labs:  Recent Labs  03/18/15 1005  HGB 12.1  HCT 37.3  PLT 144*  CREATININE 1.99*    CrCl cannot be calculated (Unknown ideal weight.).   Medical History: Past Medical History  Diagnosis Date  . Thyroid disease   . Hypertension   . Gout   . Pneumonia   . Arthritis     Medications:  Prescriptions prior to admission  Medication Sig Dispense Refill Last Dose  . allopurinol (ZYLOPRIM) 100 MG tablet Take 100 mg by mouth every morning.   03/17/2015 at Unknown time  . amLODipine (NORVASC) 5 MG tablet Take 5 mg by mouth daily.   03/17/2015 at Unknown time  . feeding supplement, ENSURE COMPLETE, (ENSURE COMPLETE) LIQD Take 237 mLs by mouth 2 (two) times daily between meals. 60 Bottle 0 03/17/2015 at Unknown time  . methimazole (TAPAZOLE) 5 MG tablet Take 5 mg by mouth every morning.    03/17/2015 at Unknown time  . omeprazole (PRILOSEC) 40 MG capsule Take 1 capsule by mouth 2 (two) times daily.   03/17/2015 at Unknown time  . senna (SENOKOT) 8.6 MG TABS tablet Take 1 tablet (8.6 mg total) by mouth daily. 30 each 0 03/17/2015 at Unknown time  . traMADol (ULTRAM) 50 MG tablet Take 50-100 mg by mouth every 6 (six) hours as needed for moderate pain.    03/17/2015 at Unknown time  . warfarin (COUMADIN) 2 MG tablet Take 1 tablet (2 mg total) by mouth one time only at 6 PM. 20 tablet 0 03/17/2015 at Unknown time    Assessment: 82 y/o female admitted 03/18/2015 with SOB likely from COPD exacerbation. MD and med hx note she takes chronic warfarin for hx bilateral DVT. Upon questioning the patient she tells me she takes no blood thinners and does not want to talk anymore. INR is pending.  PTA regimen:  2 mg daily per med hx  Goal of Therapy:  INR 2-3 Monitor platelets by anticoagulation protocol: Yes   Plan:  - F/U INR result to determine if patient taking warfarin  [facility] LLCJennifer Ringgold, MeridianPharm.D., BCPS Clinical Pharmacist Pager: [phone] 03/18/2015 6:14 PM   Addendum: INR is normal at 1.02 indicating patient is not currently taking warfarin. Appears patient may have only had one incident of DVT and may not need further anticoagulation. Discussed with Donnamarie PoagK. Kirby, NP and will hold off on warfarin tonight. Primary team and pharmacy to discuss in am. Lovenox for VTE px in the mean time.  McIntyreJennifer Glasgow, 1700 Rainbow BoulevardPharm.D., BCPS Clinical Pharmacist Pager: [phone] 03/18/2015 8:15 PM

## 2015-03-18 NOTE — ED Notes (Signed)
Pt is tachypneic at 44 and tachycardic at 126. Pt desat to 90% on 2L [place] and was increased to 4L [place].

## 2015-03-18 NOTE — H&P (Signed)
[facility] History and Physical  [name] Alexander:[id number] DOB: 08-Sep-1933 DOA: 03/18/2015  Referring physician: Emergency Department PCP: [name], MD   CHIEF COMPLAINT:                   HPI: [name] Alexander is a 82 y.o. female with a past medical history not limited to hypertension, diastolic heart failure, hyperthyroidism, COPD , DVT and chronic kidney disease stage IV. Patient uses inhalers at home as needed. Patient states she no longer smokes but son tells me that she does. Patient is not on home O2 she normally gets around well with her walker. Yesterday patient became short of breath. She felt too weak to ambulate with walker. Patient endorses chills. She endorses a productive cough over the last several days. No chest pain Weight stable per son.        ED COURSE:           Labs:   Lactic acid 1.09, troponin 0.05 WBC 3.6  EKG:    Sinus tachycardia Left bundle branch block since last tracing no significant change Confirmed by [facility] MD, ELLIOTT ([id number]) on 03/18/2015 10:19:07 AM                  Medications  technetium TC 61M diethylenetriame-pentaacetic acid (DTPA) injection 34 milli Curie (not administered)  ipratropium-albuterol (DUONEB) 0.5-2.5 (3) MG/3ML nebulizer solution 3 mL (not administered)  technetium albumin aggregated (MAA) injection solution 4 milli Curie (4 milli Curies Intravenous Contrast Given 03/18/15 1311)    Review of Systems  Constitutional: Positive for chills.  HENT: Negative.   Eyes: Negative.   Respiratory: Positive for cough, sputum production and shortness of breath.   Cardiovascular: Negative.   Gastrointestinal: Positive for constipation.  Genitourinary: Negative.   Musculoskeletal: Negative.   Skin: Negative.   Neurological: Positive for weakness.  Endo/Heme/Allergies: Negative.   Psychiatric/Behavioral: Negative.     Past Medical History  Diagnosis Date  . Thyroid disease   . Hypertension   . Gout   . Pneumonia   .  Arthritis    Past Surgical History  Procedure Laterality Date  . Abdominal hysterectomy    . Total knee arthroplasty      SOCIAL HISTORY:  reports that she has been smoking Cigarettes.  She has a 5 pack-year smoking history. She has never used smokeless tobacco. She reports that she does not drink alcohol or use illicit drugs. Lives:  With grandson     Assistive devices:   Walker needed for ambulation.   No Known Allergies  Family History  Problem Relation Age of Onset  . Stroke Neg Hx   . CAD Neg Hx     Prior to Admission medications   Medication Sig Start Date End Date Taking? Authorizing Provider  allopurinol (ZYLOPRIM) 100 MG tablet Take 100 mg by mouth every morning.   Yes Historical Provider, MD  amLODipine (NORVASC) 5 MG tablet Take 5 mg by mouth daily.   Yes Historical Provider, MD  feeding supplement, ENSURE COMPLETE, (ENSURE COMPLETE) LIQD Take 237 mLs by mouth 2 (two) times daily between meals. 12/18/13  Yes Nishant Dhungel, MD  methimazole (TAPAZOLE) 5 MG tablet Take 5 mg by mouth every morning.    Yes Historical Provider, MD  omeprazole (PRILOSEC) 40 MG capsule Take 1 capsule by mouth 2 (two) times daily. 11/04/12  Yes Historical Provider, MD  senna (SENOKOT) 8.6 MG TABS tablet Take 1 tablet (8.6 mg total) by mouth daily. 12/18/13  Yes  Nishant Dhungel, MD  traMADol (ULTRAM) 50 MG tablet Take 50-100 mg by mouth every 6 (six) hours as needed for moderate pain.  10/31/12  Yes Historical Provider, MD  warfarin (COUMADIN) 2 MG tablet Take 1 tablet (2 mg total) by mouth one time only at 6 PM. 12/18/13  Yes Nishant Dhungel, MD   PHYSICAL EXAM: Filed Vitals:   03/18/15 0952 03/18/15 1000 03/18/15 1130 03/18/15 1200  BP:  159/91 165/93 146/83  Pulse:  88 97 79  Temp: 97.6 F (36.4 C)     TempSrc: Oral     Resp:  22 31 35  SpO2:  97% 96% 99%    Wt Readings from Last 3 Encounters:  12/18/13 37.603 kg (82 lb 14.4 oz)  04/23/13 49.896 kg (110 lb)  06/17/12 61.689 kg (136 lb)     General:  Pleasant black female. Appears calm and comfortable Eyes: PER, normal lids, irises & conjunctiva ENT: grossly normal hearing, lips & tongue Neck: no LAD, no masses Cardiovascular: RRR, no murmurs. No LE edema.  Respiratory: Respirations even and unlabored. Normal respiratory effort. Lungs CTA bilaterally, no wheezes / rales (just had Nebulizer) .   Abdomen: soft, non-distended, non-tender, active bowel sounds. No obvious masses.  Skin: no rash seen on limited exam Musculoskeletal: grossly normal tone BUE/BLE Psychiatric: grossly normal mood and affect, speech fluent and appropriate Neurologic: grossly non-focal.         LABS ON ADMISSION:    Basic Metabolic Panel:  Recent Labs Lab 03/18/15 1005  NA 141  K 4.3  CL 115*  CO2 17*  GLUCOSE 123*  BUN 30*  CREATININE 1.99*  CALCIUM 10.4*   Liver Function Tests:  Recent Labs Lab 03/18/15 1005  AST 31  ALT 16  ALKPHOS 124  BILITOT 0.5  PROT 7.1  ALBUMIN 3.7    CBC:  Recent Labs Lab 03/18/15 1005  WBC 3.6*  NEUTROABS 2.3  HGB 12.1  HCT 37.3  MCV 102.2*  PLT 144*    Creatinine clearance cannot be calculated (Unknown ideal weight.)  Radiological Exams on Admission: Dg Chest 2 View  03/18/2015  CLINICAL DATA:  Shortness of breath with productive cough and fever for 2 or 3 days. History of hypertension. Ex-smoker. EXAM: CHEST  2 VIEW COMPARISON:  Radiographs 12/12/2013.  CT 04/30/2013. FINDINGS: Stable mild cardiomegaly and diffuse atherosclerosis per the pulmonary vascularity is normal. No edema, confluent airspace opacity or significant pleural effusion. There is mild chronic central airway thickening. The bones appear unchanged. IMPRESSION: Stable cardiomegaly and chronic central airway thickening. No acute findings identified. Electronically Signed   By: Carey Bullocks M.D.   On: 03/18/2015 10:42   Nm Pulmonary Perf And Vent  03/18/2015  CLINICAL DATA:  Shortness of breath. History bilateral lower  extremity DVTs. EXAM: NUCLEAR MEDICINE VENTILATION - PERFUSION LUNG SCAN TECHNIQUE: Ventilation images were obtained in multiple projections using inhaled aerosol Tc-20m DTPA. Perfusion images were obtained in multiple projections after intravenous injection of Tc-49m MAA. RADIOPHARMACEUTICALS:  34.0 Technetium-66m DTPA aerosol inhalation and 4.2 Technetium-67m MAA IV COMPARISON:  Chest x-ray 03/18/2015. FINDINGS: Ventilation: Severe ventilatory defects are noted bilaterally. Perfusion: Small perfusion defects are noted bilaterally. IMPRESSION: Technically indeterminate scan. However ventilatory defects are much more diffuse and severe than small perfusion defects present. Findings are most likely related to COPD. Electronically Signed   By: Maisie Fus  Register   On: 03/18/2015 13:45   Echo Dec 2015 Study Conclusions  - Left ventricle: Hypopkinesis of the inferolateral wall and the base  inferior wall. The cavity size was normal. Wall thickness was normal. The estimated ejection fraction was 50%. Findings consistent with left ventricular diastolic dysfunction. - Aortic valve: Sclerosis without stenosis. There was trivial regurgitation. - Mitral valve: Mildly thickened leaflets . There was mild regurgitation. - Left atrium: The atrium was moderately dilated. - Right ventricle: The cavity size was normal. Systolic function was normal.   ASSESSMENT / PLAN   Acute hypoxic respiratory failure likely secondary to COPD exacerbation. Not on home 02. Presents with dyspnea, tachypnea, productive cough, weakness. Per RN, desaturated in exam room to 90% on 2 L, improved on 4 L.  CXR negative for findings of heart failure.   -Admit to Medical bed -Continue 02 Irwin to maintain sats 88-92% -Schedule Duonebs Q4 hours with Albuterol q2 prn -antibiotics, ? Azithromycin -Solumedrol, initial dose of 125mg  given in ED. Continue with 60mg  BID -Wean oxygen as tolerated -H2 blocker or continue home PPI -PT  consult  Elevated d-dimer 3.59. VQ scan indeterminate but patient is chronically anticoagulated so PE unlikely. Check INR to make sure therapeutic  Chronic anticoagulation for bilateral DVT December 2015 -check INR -coumadin per pharmacy  Urinary frequency. Complains of malodorous urine. UA essentially unremarkable -Sent urine for culture  History of diastolic dysfunction on echo 2015.  No evidence for decompensation but will check BNP.   Hypertension.  -Continue home Norvasc   Chronic kidney disease, stage IV . Cr at baseline -Avoid nephrotoxic medications -Monitor bmet  Tobacco abuse. Smoking per son  -RN to provide smoking cessation education   Constipation, chronic -continue home senekot  Hyperthyroidism.  -continue Tapazole -check TSH  Gout.  -continue zyloprim  Chronic thrombocytopenia, stable  CONSULTANTS:    none  Code Status: full DVT Prophylaxis:  already anti-coagulated Family Communication:  Patient alert, oriented and understands plan of care.  Disposition Plan: Discharge to home in 24-48 hours   Time spent: 60 minutes Willette ClusterPaula Guenther  NP [facility] Pager [phone]

## 2015-03-18 NOTE — Progress Notes (Signed)
Patient trasfered from ED to [phone] via stretcher; alert and oriented x 4; complaints ofabdominal pain; IV in RAC running ABx; skin intact. Orient patient to room and unit;gave patient care guide; instructed how to use the call bell and  fall risk precautions. Will continue to monitor the patient.

## 2015-03-18 NOTE — ED Provider Notes (Addendum)
CSN: [id number]     Arrival date & time 03/18/15  7829 History   First MD Initiated Contact with Patient 03/18/15 1018     Chief Complaint  Patient presents with  . Fatigue     (Consider location/radiation/quality/duration/timing/severity/associated sxs/prior Treatment) HPI   [name] Alexander is a 82 y.o. female  who presents for evaluation of malaise, weakness and shortness of breath which started this morning. She states yesterday she was able to eat, but today is not hungry. She denies fever, chills, nausea, vomiting or diarrhea. She lives with her son. She came here by EMS. She saw her PCP for checkup. 2 weeks ago, but has not had any other recent illnesses. There are no other known modifying factors.   Past Medical History  Diagnosis Date  . Thyroid disease   . Hypertension   . Gout   . Pneumonia   . Arthritis    Past Surgical History  Procedure Laterality Date  . Abdominal hysterectomy    . Total knee arthroplasty     Family History  Problem Relation Age of Onset  . Stroke Neg Hx   . CAD Neg Hx    Social History  Substance Use Topics  . Smoking status: Current Every Day Smoker -- 0.25 packs/day for 20 years    Types: Cigarettes  . Smokeless tobacco: Never Used  . Alcohol Use: No   OB History    No data available     Review of Systems  All other systems reviewed and are negative.     Allergies  Review of patient's allergies indicates no known allergies.  Home Medications   Prior to Admission medications   Medication Sig Start Date End Date Taking? Authorizing Provider  allopurinol (ZYLOPRIM) 100 MG tablet Take 100 mg by mouth every morning.   Yes Historical Provider, MD  amLODipine (NORVASC) 5 MG tablet Take 5 mg by mouth daily.   Yes Historical Provider, MD  feeding supplement, ENSURE COMPLETE, (ENSURE COMPLETE) LIQD Take 237 mLs by mouth 2 (two) times daily between meals. 12/18/13  Yes Nishant Dhungel, MD  methimazole (TAPAZOLE) 5 MG tablet Take 5 mg by  mouth every morning.    Yes Historical Provider, MD  omeprazole (PRILOSEC) 40 MG capsule Take 1 capsule by mouth 2 (two) times daily. 11/04/12  Yes Historical Provider, MD  senna (SENOKOT) 8.6 MG TABS tablet Take 1 tablet (8.6 mg total) by mouth daily. 12/18/13  Yes Nishant Dhungel, MD  traMADol (ULTRAM) 50 MG tablet Take 50-100 mg by mouth every 6 (six) hours as needed for moderate pain.  10/31/12  Yes Historical Provider, MD  warfarin (COUMADIN) 2 MG tablet Take 1 tablet (2 mg total) by mouth one time only at 6 PM. 12/18/13  Yes Nishant Dhungel, MD   BP 146/83 mmHg  Pulse 79  Temp(Src) 97.6 F (36.4 C) (Oral)  Resp 35  SpO2 99% Physical Exam  Constitutional: She is oriented to person, place, and time. She appears well-developed. She appears distressed (She is uncomfortable).  Elderly, frail  HENT:  Head: Normocephalic and atraumatic.  Right Ear: External ear normal.  Left Ear: External ear normal.  Eyes: Conjunctivae and EOM are normal. Pupils are equal, round, and reactive to light.  Neck: Normal range of motion and phonation normal. Neck supple.  Cardiovascular: Normal rate, regular rhythm and normal heart sounds.   Pulmonary/Chest: Effort normal and breath sounds normal. She exhibits no bony tenderness.  Abdominal: Soft. There is no tenderness.  Musculoskeletal: Normal  range of motion.  Decreased range of motion left hip secondary to pain. No deformities of hips, knees or ankles.  Neurological: She is alert and oriented to person, place, and time. No cranial nerve deficit or sensory deficit. She exhibits normal muscle tone. Coordination normal.  Skin: Skin is warm, dry and intact.  Psychiatric: She has a normal mood and affect. Her behavior is normal. Judgment and thought content normal.  Nursing note and vitals reviewed.   ED Course  Procedures (including critical care time)  Medications  technetium TC 67M diethylenetriame-pentaacetic acid (DTPA) injection 34 milli Curie (not  administered)  ipratropium-albuterol (DUONEB) 0.5-2.5 (3) MG/3ML nebulizer solution 3 mL (not administered)  technetium albumin aggregated (MAA) injection solution 4 milli Curie (4 milli Curies Intravenous Contrast Given 03/18/15 1311)    Patient Vitals for the past 24 hrs:  BP Temp Temp src Pulse Resp SpO2  03/18/15 1200 146/83 mmHg - - 79 (!) 35 99 %  03/18/15 1130 165/93 mmHg - - 97 (!) 31 96 %  03/18/15 1000 159/91 mmHg - - 88 22 97 %  03/18/15 0952 - 97.6 F (36.4 C) Oral - - -  03/18/15 0948 - - - - - 96 %    2:39 PM Reevaluation with update and discussion. After initial assessment and treatment, an updated evaluation reveals No change in clinical status. She continues to be tachypnea. Oxygen saturation 99% on oxygenation by nasal cannula. Relation in ED is complete. Findings discussed with patient and family member, all questions answered. Mico Spark L     2:44 PM-Consult complete with Hospitalist. Patient case explained and discussed. She agrees to admit patient for further evaluation and treatment. Call ended at 14:55  14:50- she had an episode of shortness of breath, and is currently wheezing with intercostal retractions, and increased work of breathing with decreased air movement. Nebulizer, and Solu-Medrol ordered.   16:10- patient is somewhat sleepy, now, while on her nebulizer. ABG ordered. Will consider starting BiPAP, depending on her progress, and the blood gas results.  16:25- . Blood gas did not correlate with peripheral pulse oxygenation, 84% vs. 93% at 16:25. Therefore it is a mixed venous gas. PH is reassuring at 7.4. Patient placed on Ventimask at 50%.  Reevaluation. 16:55. Patient is resting comfortably semi-Fowler position with oxygen saturation 99%, on Ventimask.   CRITICAL CARE Performed by: Flint Melter Total critical care time: 45  minutes Critical care time was exclusive of separately billable procedures and treating other patients. Critical care  was necessary to treat or prevent imminent or life-threatening deterioration. Critical care was time spent personally by me on the following activities: development of treatment plan with patient and/or surrogate as well as nursing, discussions with consultants, evaluation of patient's response to treatment, examination of patient, obtaining history from patient or surrogate, ordering and performing treatments and interventions, ordering and review of laboratory studies, ordering and review of radiographic studies, pulse oximetry and re-evaluation of patient's condition.   Labs Review Labs Reviewed  CBC WITH DIFFERENTIAL/PLATELET - Abnormal; Notable for the following:    WBC 3.6 (*)    RBC 3.65 (*)    MCV 102.2 (*)    Platelets 144 (*)    All other components within normal limits  COMPREHENSIVE METABOLIC PANEL - Abnormal; Notable for the following:    Chloride 115 (*)    CO2 17 (*)    Glucose, Bld 123 (*)    BUN 30 (*)    Creatinine, Ser 1.99 (*)  Calcium 10.4 (*)    GFR calc non Af Amer 23 (*)    GFR calc Af Amer 26 (*)    All other components within normal limits  URINALYSIS, ROUTINE W REFLEX MICROSCOPIC (NOT AT [facility]) - Abnormal; Notable for the following:    Hgb urine dipstick SMALL (*)    Protein, ur >300 (*)    All other components within normal limits  D-DIMER, QUANTITATIVE (NOT AT [facility]) - Abnormal; Notable for the following:    D-Dimer, Quant 3.59 (*)    All other components within normal limits  URINE MICROSCOPIC-ADD ON - Abnormal; Notable for the following:    Squamous Epithelial / LPF 0-5 (*)    All other components within normal limits  I-STAT TROPOININ, ED  I-STAT CG4 LACTIC ACID, ED  I-STAT CG4 LACTIC ACID, ED    Imaging Review Dg Chest 2 View  03/18/2015  CLINICAL DATA:  Shortness of breath with productive cough and fever for 2 or 3 days. History of hypertension. Ex-smoker. EXAM: CHEST  2 VIEW COMPARISON:  Radiographs 12/12/2013.  CT 04/30/2013. FINDINGS: Stable  mild cardiomegaly and diffuse atherosclerosis per the pulmonary vascularity is normal. No edema, confluent airspace opacity or significant pleural effusion. There is mild chronic central airway thickening. The bones appear unchanged. IMPRESSION: Stable cardiomegaly and chronic central airway thickening. No acute findings identified. Electronically Signed   By: Carey BullocksWilliam  Veazey M.D.   On: 03/18/2015 10:42   Nm Pulmonary Perf And Vent  03/18/2015  CLINICAL DATA:  Shortness of breath. History bilateral lower extremity DVTs. EXAM: NUCLEAR MEDICINE VENTILATION - PERFUSION LUNG SCAN TECHNIQUE: Ventilation images were obtained in multiple projections using inhaled aerosol Tc-2023m DTPA. Perfusion images were obtained in multiple projections after intravenous injection of Tc-8323m MAA. RADIOPHARMACEUTICALS:  34.0 Technetium-3123m DTPA aerosol inhalation and 4.2 Technetium-4523m MAA IV COMPARISON:  Chest x-ray 03/18/2015. FINDINGS: Ventilation: Severe ventilatory defects are noted bilaterally. Perfusion: Small perfusion defects are noted bilaterally. IMPRESSION: Technically indeterminate scan. However ventilatory defects are much more diffuse and severe than small perfusion defects present. Findings are most likely related to COPD. Electronically Signed   By: Maisie Fushomas  Register   On: 03/18/2015 13:45   I have personally reviewed and evaluated these images and lab results as part of my medical decision-making.   EKG Interpretation   Date/Time:  Tuesday March 18 2015 09:51:56 EST Ventricular Rate:  100 PR Interval:  166 QRS Duration: 121 QT Interval:  369 QTC Calculation: 476 R Axis:   -72 Text Interpretation:  Sinus tachycardia Left bundle branch block since  last tracing no significant change Confirmed by Mattisen Pohlmann  MD, Maddock Finigan ([id number])  on 03/18/2015 10:19:07 AM      MDM   Final diagnoses:  Dyspnea  Tachypnea  Bronchospasm    Shortness of breath without clear cause. Lung examination does not indicate wheezing.  No evidence for CHF, PE or pneumonia. Patient given trial of albuterol bronchodilator. Will admit for observation, and treatment as indicated.  Nursing Notes Reviewed/ Care Coordinated, and agree without changes. Applicable Imaging Reviewed.  Interpretation of Laboratory Data incorporated into ED treatment  Plan: Admit        Mancel BaleElliott Pinkney Venard, MD 03/18/15 1659

## 2015-03-18 NOTE — ED Notes (Signed)
Pt arrives from home via [facility]. Pt states she has been experiencing generalized weakness. At baseline pt is able to ambulate with the use of a walker, but is unable to stand now. Pt has no c/o of sob, cp, nvd. Pt endorses urinary frequency with a foul odor.

## 2015-03-19 ENCOUNTER — Inpatient Hospital Stay (HOSPITAL_COMMUNITY): Payer: Medicare Other

## 2015-03-19 DIAGNOSIS — R609 Edema, unspecified: Secondary | ICD-10-CM

## 2015-03-19 DIAGNOSIS — I82403 Acute embolism and thrombosis of unspecified deep veins of lower extremity, bilateral: Secondary | ICD-10-CM

## 2015-03-19 DIAGNOSIS — E0590 Thyrotoxicosis, unspecified without thyrotoxic crisis or storm: Secondary | ICD-10-CM

## 2015-03-19 LAB — CBC
HEMATOCRIT: 37.3 % (ref 36.0–46.0)
HEMOGLOBIN: 12.2 g/dL (ref 12.0–15.0)
MCH: 33.1 pg (ref 26.0–34.0)
MCHC: 32.7 g/dL (ref 30.0–36.0)
MCV: 101.1 fL — AB (ref 78.0–100.0)
Platelets: 146 10*3/uL — ABNORMAL LOW (ref 150–400)
RBC: 3.69 MIL/uL — ABNORMAL LOW (ref 3.87–5.11)
RDW: 14 % (ref 11.5–15.5)
WBC: 1.9 10*3/uL — ABNORMAL LOW (ref 4.0–10.5)

## 2015-03-19 LAB — BASIC METABOLIC PANEL
Anion gap: 10 (ref 5–15)
BUN: 30 mg/dL — AB (ref 6–20)
CO2: 16 mmol/L — ABNORMAL LOW (ref 22–32)
Calcium: 10.4 mg/dL — ABNORMAL HIGH (ref 8.9–10.3)
Chloride: 114 mmol/L — ABNORMAL HIGH (ref 101–111)
Creatinine, Ser: 1.91 mg/dL — ABNORMAL HIGH (ref 0.44–1.00)
GFR calc Af Amer: 28 mL/min — ABNORMAL LOW (ref >60)
GFR calc non Af Amer: 24 mL/min — ABNORMAL LOW (ref >60)
GLUCOSE: 148 mg/dL — AB (ref 65–99)
POTASSIUM: 4.5 mmol/L (ref 3.5–5.1)
SODIUM: 140 mmol/L (ref 135–145)

## 2015-03-19 MED ORDER — WARFARIN SODIUM 2 MG PO TABS
2.0000 mg | ORAL_TABLET | Freq: Once | ORAL | Status: AC
  Administered 2015-03-19: 2 mg via ORAL
  Filled 2015-03-19: qty 1

## 2015-03-19 MED ORDER — SODIUM BICARBONATE 650 MG PO TABS
650.0000 mg | ORAL_TABLET | Freq: Three times a day (TID) | ORAL | Status: DC
  Administered 2015-03-19 – 2015-03-20 (×5): 650 mg via ORAL
  Filled 2015-03-19 (×5): qty 1

## 2015-03-19 MED ORDER — ENOXAPARIN (LOVENOX) PATIENT EDUCATION KIT
PACK | Freq: Once | Status: DC
  Filled 2015-03-19: qty 1

## 2015-03-19 MED ORDER — COUMADIN BOOK
Freq: Once | Status: DC
  Filled 2015-03-19: qty 1

## 2015-03-19 MED ORDER — WARFARIN - PHARMACIST DOSING INPATIENT: Freq: Every day | Status: DC

## 2015-03-19 MED ORDER — WARFARIN VIDEO: Freq: Once | Status: DC

## 2015-03-19 MED ORDER — ENOXAPARIN SODIUM 40 MG/0.4ML ~~LOC~~ SOLN
40.0000 mg | SUBCUTANEOUS | Status: DC
  Administered 2015-03-19: 40 mg via SUBCUTANEOUS
  Filled 2015-03-19: qty 0.4

## 2015-03-19 MED ORDER — ENSURE ENLIVE PO LIQD
237.0000 mL | Freq: Two times a day (BID) | ORAL | Status: DC
  Administered 2015-03-20 (×2): 237 mL via ORAL

## 2015-03-19 NOTE — Evaluation (Signed)
Occupational Therapy Evaluation Patient Details Name: [name] Alexander MRN: [id number] DOB: 1933-09-27 Today's Date: 03/19/2015    History of Present Illness 82 yo female with onset of acute respiratory failure and AMS was noted to have PMHx:  HTN, gout, CHF   Clinical Impression   Pt was assisted for bathing, dressing and IADL prior to admission. Presents with generalized weakness and impaired balance. Will follow to address toileting and standing activities at sink.     Follow Up Recommendations  No OT follow up    Equipment Recommendations  None recommended by OT    Recommendations for Other Services       Precautions / Restrictions Precautions Precautions: Fall Restrictions Weight Bearing Restrictions: No      Mobility Bed Mobility Overal bed mobility: Needs Assistance Bed Mobility: Supine to Sit     Supine to sit: Min assist     General bed mobility comments: assist to advance hips to EOB with pad  Transfers Overall transfer level: Needs assistance Equipment used: 1 person hand held assist Transfers: Sit to/from Stand;Stand Pivot Transfers Sit to Stand: Min assist Stand pivot transfers: Min assist       General transfer comment: assist to rise and for balance    Balance Overall balance assessment: Needs assistance Sitting-balance support: Feet supported Sitting balance-Leahy Scale: Fair       Standing balance-Leahy Scale: Poor                              ADL Overall ADL's : Needs assistance/impaired Eating/Feeding: Set up;Sitting   Grooming: Wash/dry hands;Sitting;Set up   Upper Body Bathing: Minimal assitance;Sitting   Lower Body Bathing: Maximal assistance;Sit to/from stand   Upper Body Dressing : Minimal assistance;Sitting   Lower Body Dressing: Maximal assistance;Sit to/from stand   Toilet Transfer: Minimal assistance;Stand-pivot;BSC                   Vision     Perception     Praxis      Pertinent  Vitals/Pain Pain Assessment: No/denies pain     Hand Dominance Right   Extremity/Trunk Assessment Upper Extremity Assessment Upper Extremity Assessment: Generalized weakness   Lower Extremity Assessment Lower Extremity Assessment: Generalized weakness   Cervical / Trunk Assessment Cervical / Trunk Assessment: Kyphotic   Communication Communication Communication: HOH   Cognition Arousal/Alertness: Awake/alert Behavior During Therapy: WFL for tasks assessed/performed Overall Cognitive Status: Within Functional Limits for tasks assessed                     General Comments       Exercises Exercises: Other exercises (LE strength was 4 to 4+ throughout)     Shoulder Instructions      Home Living Family/patient expects to be discharged to:: Private residence Living Arrangements: Other relatives (grandsons) Available Help at Discharge: Family;Available PRN/intermittently Type of Home: House Home Access: Stairs to enter [employer] of Steps: 1 Entrance Stairs-Rails: Right;Left;Can reach both Home Layout: One level         Bathroom Toilet: Standard Bathroom Accessibility: Yes   Home Equipment: Walker - 2 wheels;Walker - 4 wheels;Shower seat          Prior Functioning/Environment Level of Independence: Needs assistance  Gait / Transfers Assistance Needed: ambulates with RW ADL's / Homemaking Assistance Needed: Assist of caregiver or grandson for bathing and dressing.        OT Diagnosis: Generalized weakness  OT Problem List: Decreased strength;Decreased activity tolerance;Impaired balance (sitting and/or standing)   OT Treatment/Interventions: Self-care/ADL training;Energy conservation;Balance training;DME and/or AE instruction;Patient/family education    OT Goals(Current goals can be found in the care plan section) Acute Rehab OT Goals Patient Stated Goal: to get stronger Potential to Achieve Goals: Good  OT Frequency: Min 2X/week    Barriers to D/C:            Co-evaluation              End of Session Equipment Utilized During Treatment: Gait belt  Activity Tolerance: Patient tolerated treatment well Patient left: in chair;with call bell/phone within reach;with family/visitor present   Time: 1411-1434 OT Time Calculation (min): 23 min Charges:  OT General Charges $OT Visit: 1 Procedure OT Evaluation $OT Eval Moderate Complexity: 1 Procedure OT Treatments $Self Care/Home Management : 8-22 mins G-Codes:    Evern BioMayberry, Lurena Naeve Lynn 03/19/2015, 2:43 PM [phone]

## 2015-03-19 NOTE — Evaluation (Signed)
Physical Therapy Evaluation Patient Details Name: [name] Alexander MRN: [id number] DOB: Oct 24, 1933 Today's Date: 03/19/2015   History of Present Illness  82 yo female with onset of acute respiratory failure and AMS was noted to have PMHx:  HTN, gout, CHF  Clinical Impression  Pt is motivated to work on strengthening and will benefit from HHPT to follow her after her stay in hospital, especially to get her standing longer with better balance.  Her plan is to work on standing endurance and gait along with general LE strengthening next visit.    Follow Up Recommendations Home health PT;Supervision/Assistance - 24 hour    Equipment Recommendations  None recommended by PT (has RW)    Recommendations for Other Services       Precautions / Restrictions Precautions Precautions: Fall (telemetry) Restrictions Weight Bearing Restrictions: No      Mobility  Bed Mobility Overal bed mobility: Needs Assistance Bed Mobility: Supine to Sit     Supine to sit: Min assist     General bed mobility comments: help mainly to finish sliding out to EOB and to support trunk  Transfers Overall transfer level: Needs assistance Equipment used: Rolling walker (2 wheeled);1 person hand held assist Transfers: Sit to/from [employer]Stand;Stand Pivot Transfers Sit to Stand: Min assist Stand pivot transfers: Min assist       General transfer comment: reminders for hand placmeent  Ambulation/Gait Ambulation/Gait assistance: Min guard;Min assist Ambulation Distance (Feet): 4 Feet Assistive device: Rolling walker (2 wheeled);1 person hand held assist Gait Pattern/deviations: Step-to pattern;Trunk flexed;Narrow base of support;Shuffle Gait velocity: reduced Gait velocity interpretation: Below normal speed for age/gender General Gait Details: weak to advance LE's but pt is able to assist getting to the chair  Stairs            Wheelchair Mobility    Modified Rankin (Stroke Patients Only)        Balance Overall balance assessment: Needs assistance Sitting-balance support: Feet supported Sitting balance-Leahy Scale: Fair       Standing balance-Leahy Scale: Poor                               Pertinent Vitals/Pain Pain Assessment: No/denies pain    Home Living Family/patient expects to be discharged to:: Private residence Living Arrangements: Other relatives (grandsons) Available Help at Discharge: Family;Available PRN/intermittently Type of Home: House Home Access: Stairs to enter Entrance Stairs-Rails: Right;Left;Can reach both Entrance Stairs-Number of Steps: 1 Home Layout: One level Home Equipment: Walker - 2 wheels;Walker - 4 wheels;Shower seat      Prior Function Level of Independence: Independent with assistive device(s)               Hand Dominance        Extremity/Trunk Assessment   Upper Extremity Assessment: Generalized weakness           Lower Extremity Assessment: Generalized weakness      Cervical / Trunk Assessment: Kyphotic  Communication   Communication: HOH  Cognition Arousal/Alertness: Awake/alert Behavior During Therapy: WFL for tasks assessed/performed Overall Cognitive Status: Within Functional Limits for tasks assessed                      General Comments General comments (skin integrity, edema, etc.): Pt is up with PT to chair and chair alarm activated.  Her plan is to go home with grandsons but is not completely independent.  Will plan for HHPT to follow  up for strengthening and control of balance    Exercises        Assessment/Plan    PT Assessment Patient needs continued PT services  PT Diagnosis Generalized weakness;Difficulty walking   PT Problem List Decreased strength;Decreased range of motion;Decreased activity tolerance;Decreased balance;Decreased mobility;Decreased coordination;Decreased knowledge of use of DME;Decreased skin integrity  PT Treatment Interventions Gait training;DME  instruction;Functional mobility training;Therapeutic activities;Therapeutic exercise;Balance training;Neuromuscular re-education;Patient/family education   PT Goals (Current goals can be found in the Care Plan section) Acute Rehab PT Goals Patient Stated Goal: to get stronger PT Goal Formulation: With patient/family Time For Goal Achievement: 04/02/15 Potential to Achieve Goals: Good    Frequency Min 3X/week   Barriers to discharge Other (comment) (will need further therapy to decrease fall risk)      Co-evaluation               End of Session Equipment Utilized During Treatment: Gait belt Activity Tolerance: Patient tolerated treatment well Patient left: in chair;with call bell/phone within reach;with chair alarm set;with family/visitor present Nurse Communication: Mobility status         Time: 1610-9604 PT Time Calculation (min) (ACUTE ONLY): 25 min   Charges:   PT Evaluation $PT Eval Low Complexity: 1 Procedure PT Treatments $Gait Training: 8-22 mins   PT G CodesIvar Drape 03/25/2015, 12:14 PM   Samul Dada, PT MS Acute Rehab Dept. Number: [id number] and [facility] [phone]

## 2015-03-19 NOTE — Progress Notes (Addendum)
ANTICOAGULATION CONSULT NOTE - Initial Consult  Pharmacy Consult for Coumadin and Lovenox Indication: DVT  No Known Allergies  Patient Measurements: Height: 5\' 2"  (157.5 cm) Weight: 88 lb 2.9 oz (40 kg) IBW/kg (Calculated) : 50.1  Vital Signs: Temp: 99.1 F (37.3 C) (03/08 1324) Temp Source: Oral (03/08 1324) BP: 137/63 mmHg (03/08 1324) Pulse Rate: 87 (03/08 1324)  Labs:  Recent Labs  03/18/15 1005 03/18/15 1837 03/19/15 0445  HGB 12.1  --  12.2  HCT 37.3  --  37.3  PLT 144*  --  146*  LABPROT  --  13.6  --   INR  --  1.02  --   CREATININE 1.99*  --  1.91*    Estimated Creatinine Clearance: 15.1 mL/min (by C-G formula based on Cr of 1.91).   Medical History: Past Medical History  Diagnosis Date  . Thyroid disease   . Hypertension   . Gout   . Pneumonia   . Arthritis     Medications:  None pta - completed 6 months of warfarin treatment for prior DVTs Lovenox 30mg  SQ daily this admission - last dose 03/18/15 at 20:58 PM.  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   Note that this is a patient with low body weight and CKD - current CrCl ~ 15.1 mL/min. Platelets are low at 144 on admission and currently 146. LFTs are within normal limits. Albumin is pretty good at 3.7 (goal 4).  Discussed initiation with patient and son. Patient is very groggy but understands need for therapy.   Drug-Drug interactions noted with Coumadin therapy include: 1) Azithromycin- this is a major interaction which can yield a prolonged INR and increased risk of bleeding. 2) Methimazole - this is a moderate interaction which can result in decreased Coumadin effects.  Goal of Therapy:  INR 2-3 Anti-Xa  level 0.6-1 units/ml 4hrs after LMWH dose given  Monitor platelets by anticoagulation protocol: Yes   Plan:  Coumadin 2mg  po x1 tonight. Daily PT/INR Monitor CBC and for signs and symptoms of bleeding - platelets on low side.  Lovenox 40 mg SQ daily -dose adjusted for renal function.  Will check anti-Xa level 4 hours post dose to ensure dose is not too high.  Monitor SCr on Lovenox - discussed with Dr. Rito EhrlichKrishnan, will monitor as outpatient.  Will need re-education with patient prior to discharge as she was very groggy today.   Link SnufferJessica Xachary Hambly, PharmD, BCPS Clinical Pharmacist [phone] 03/19/2015,3:33 PM

## 2015-03-19 NOTE — Progress Notes (Signed)
CM received consult:To discharge with Lovenox bridge with warfarin. Initial therapy starts tonight (3/8)- possible d/c tomorrow. Unsure if need to look at Lovenox cost for this patient. Pt with Medicaid coverage, ? Cost $ 3-.00- 4.00 out of pocket cost for pt  per prescription. Gae Gallopngela Finley Dinkel RN,,BSN,CM [phone]

## 2015-03-19 NOTE — Progress Notes (Signed)
UR COMPLETED  

## 2015-03-19 NOTE — Progress Notes (Signed)
[facility] PROGRESS NOTE  [name] BowenLillie B Alexander ZOX:[id number] DOB: [birth year] DOA: 03/18/2015  PCP: [name] Alexander  Brief HPI: [name] Alexander is a 82 y.o. female with a past medical history not limited to hypertension, diastolic heart failure, hyperthyroidism, COPD, DVT and chronic kidney disease stage IV. Patient is not on home O2 she normally gets around well with her walker. Patient presented with shortness of breath and cough for a few days.   Past medical history:  Past Medical History  Diagnosis Date  . Thyroid disease   . Hypertension   . Gout   . Pneumonia   . Arthritis     Consultants: None  Procedures:  Lower extremity venous Doppler Lower extremity venous duplex has been completed. Preliminary findings: Technically limited study due to patient position. Findings consistent with mixed acute and chronic deep vein thrombosis involving the right popliteal vein, left common femoral vein, left femoral vein, and left popliteal vein. Findings consistent with age indeterminate superficial vein thrombosis involving the right small saphenous vein and left small saphenous vein.   Antibiotics: Azithromycin  Subjective: Patient feels better this morning. Breathing is improved. Denies any chest pain. Has been coughing. Her son is at the bedside.  Objective: Vital Signs  Filed Vitals:   03/18/15 2209 03/19/15 0526 03/19/15 0826 03/19/15 0935  BP: 151/80 141/84  144/88  Pulse: 89 103    Temp: 98.1 F (36.7 C) 99 F (37.2 C)    TempSrc: Oral Oral    Resp: 16 17    Height:      Weight:      SpO2: 97% 100% 96%     Intake/Output Summary (Last 24 hours) at 03/19/15 1108 Last data filed at 03/18/15 1840  Gross per 24 hour  Intake    100 ml  Output      0 ml  Net    100 ml   Filed Weights   03/18/15 1817  Weight: 40 kg (88 lb 2.9 oz)    General appearance: alert, cooperative, appears stated age and no distress Resp: Diminished air entry at the bases without any  definite crackles or rhonchi. No wheezes. Cardio: regular rate and rhythm, S1, S2 normal, no murmur, click, rub or gallop GI: soft, non-tender; bowel sounds normal; no masses,  no organomegaly Extremities: Swelling noted Both lower extremities. No pitting edema. Neurologic: Alert and oriented X 3. No focal deficits  Lab Results:  Basic Metabolic Panel:  Recent Labs Lab 03/18/15 1005 03/19/15 0445  NA 141 140  K 4.3 4.5  CL 115* 114*  CO2 17* 16*  GLUCOSE 123* 148*  BUN 30* 30*  CREATININE 1.99* 1.91*  CALCIUM 10.4* 10.4*   Liver Function Tests:  Recent Labs Lab 03/18/15 1005  AST 31  ALT 16  ALKPHOS 124  BILITOT 0.5  PROT 7.1  ALBUMIN 3.7   CBC:  Recent Labs Lab 03/18/15 1005 03/19/15 0445  WBC 3.6* 1.9*  NEUTROABS 2.3  --   HGB 12.1 12.2  HCT 37.3 37.3  MCV 102.2* 101.1*  PLT 144* 146*   BNP (last 3 results)  Recent Labs  03/18/15 1837  BNP 571.4*    Studies/Results: Dg Chest 2 View  03/18/2015  CLINICAL DATA:  Shortness of breath with productive cough and fever for 2 or 3 days. History of hypertension. Ex-smoker. EXAM: CHEST  2 VIEW COMPARISON:  Radiographs 12/12/2013.  CT 04/30/2013. FINDINGS: Stable mild cardiomegaly and diffuse atherosclerosis per the pulmonary vascularity is normal. No edema,  confluent airspace opacity or significant pleural effusion. There is mild chronic central airway thickening. The bones appear unchanged. IMPRESSION: Stable cardiomegaly and chronic central airway thickening. No acute findings identified. Electronically Signed   By: Carey Bullocks M.D.   On: 03/18/2015 10:42   Nm Pulmonary Perf And Vent  03/18/2015  CLINICAL DATA:  Shortness of breath. History bilateral lower extremity DVTs. EXAM: NUCLEAR MEDICINE VENTILATION - PERFUSION LUNG SCAN TECHNIQUE: Ventilation images were obtained in multiple projections using inhaled aerosol Tc-40m DTPA. Perfusion images were obtained in multiple projections after intravenous injection  of Tc-31m MAA. RADIOPHARMACEUTICALS:  34.0 Technetium-38m DTPA aerosol inhalation and 4.2 Technetium-79m MAA IV COMPARISON:  Chest x-ray 03/18/2015. FINDINGS: Ventilation: Severe ventilatory defects are noted bilaterally. Perfusion: Small perfusion defects are noted bilaterally. IMPRESSION: Technically indeterminate scan. However ventilatory defects are much more diffuse and severe than small perfusion defects present. Findings are most likely related to COPD. Electronically Signed   By: Maisie Fus  Register   On: 03/18/2015 13:45    Medications:  Scheduled: . allopurinol  100 mg Oral q morning - 10a  . amLODipine  5 mg Oral Daily  . azithromycin  500 mg Intravenous Q24H  . docusate sodium  100 mg Oral BID  . enoxaparin (LOVENOX) injection  30 mg Subcutaneous Q24H  . feeding supplement  237 mL Oral BID BM  . ipratropium-albuterol  3 mL Nebulization QID  . methimazole  5 mg Oral q morning - 10a  . methylPREDNISolone (SOLU-MEDROL) injection  60 mg Intravenous Q12H  . pantoprazole  40 mg Oral Daily  . senna  1 tablet Oral Daily  . sodium bicarbonate  650 mg Oral TID   Continuous: . sodium chloride 10 mL/hr at 03/19/15 1219   ZOX:[id number] **OR** acetaminophen, albuterol, HYDROcodone-acetaminophen, ondansetron **OR** ondansetron (ZOFRAN) IV, polyethylene glycol, technetium TC 53M diethylenetriame-pentaacetic acid, traMADol  Assessment/Plan:  Principal Problem:   Dyspnea Active Problems:   Hypertension   Hyperthyroidism   COPD exacerbation (HCC)   CKD (chronic kidney disease), stage IV (HCC)   Acute respiratory failure with hypoxia (HCC)   Tobacco abuse    Acute hypoxic respiratory failure Most likely secondary to COPD exacerbation. VQ scan was indeterminate. However, patient has been found to have lower extremity DVT, so venous thromboembolism cannot be entirely ruled out. See below. Patient has shown improvement. Continue current management for now. Continue antibiotics, nebulizer  treatments, steroids.  Acute lower extremity DVT  Patient was diagnosed with bilateral DVTs in 2015. She was placed on warfarin at that time. I was told by the patient's Son today that the warfarin was discontinued after 6 months of treatment. Patient does have swelling in her legs. Acute and chronic DVTs noted in the lower legs. She will be placed back on warfarin. Lovenox for bridging purposes. Cannot use newer agents due to chronic renal failure.  Urinary frequency Complains of malodorous urine. UA essentially unremarkable.  History of diastolic dysfunction on echo 2015.  No evidence for decompensation.  Essential Hypertension Continue home Norvasc  Chronic kidney disease, stage IV Cr at baseline. Avoid nephrotoxic medications. Noted to have metabolic acidosis. Start sodium bicarbonate.  Tobacco abuse Patient tells me that she has stopped smoking. However, according to son, she continues to smoke.   Constipation, chronic Continue home senekot  Hyperthyroidism Continue Tapazole. TSH is normal.  Gout. Continue allopurinol.  Chronic thrombocytopenia Stable  DVT Prophylaxis: On Lovenox and warfarin    Code Status: Full code  Family Communication: Discussed with the patient and son  Disposition Plan: Await further improvement. Anticipate discharge tomorrow.    LOS: 1 day   [facility]  [facility] Pager [phone] 03/19/2015, 11:08 AM  If 7PM-7AM, please contact night-coverage at [email or web address], password [facility]

## 2015-03-19 NOTE — Progress Notes (Addendum)
*  PRELIMINARY RESULTS* Vascular Ultrasound Lower extremity venous duplex has been completed.  Preliminary findings: Technically limited study due to patient position. Findings consistent with mixed acute and chronic deep vein thrombosis involving the right popliteal vein, left common femoral vein, left femoral vein, and left popliteal vein. Findings consistent with age indeterminate superficial vein thrombosis involving the right small saphenous vein and left small saphenous vein.    Farrel DemarkJill Eunice, RDMS, RVT  03/19/2015, 2:01 PM

## 2015-03-19 NOTE — Progress Notes (Signed)
Initial Nutrition Assessment  DOCUMENTATION CODES:   Underweight, Severe malnutrition in context of chronic illness  INTERVENTION:  -d/c Boost Breeze -Provide Ensure Enlive BID, 350 kcal, 20 grams of protein per serving.   NUTRITION DIAGNOSIS:   Malnutrition related to poor appetite as evidenced by per patient/family report, severe depletion of body fat, severe depletion of muscle mass, energy intake < or equal to 50% for > or equal to 1 month.  GOAL:   Patient will meet greater than or equal to 90% of their needs  MONITOR:   PO intake, Supplement acceptance, Labs, Weight trends  REASON FOR ASSESSMENT:   Consult Assessment of nutrition requirement/status  ASSESSMENT:   Pt with a past medical history not limited to hypertension, diastolic heart failure, hyperthyroidism, COPD , DVT and chronic kidney disease stage IV. Yesterday patient became short of breath. She felt too weak to ambulate with walker.    Pt seen for consult on assessment of nutritional needs. Pt states her appetite has been poor for past month. Pt's son reports a greater decline in appetite in the past week. Son reports a fair appetite at baseline. PTA pt consumed a Boost Plus daily, will provide Ensure Enlive BID, to help meet nutritional needs.  Pt's son report no recent weight loss. Pt is classified as underweight with BMI of 16.2.   Conducted nutrition focused physical exam, identified severe muscle and fat wasting. No edema present.   Medications reviewed. Labs reviewed.   Diet Order:  Diet Heart Room service appropriate?: Yes; Fluid consistency:: Thin  Skin:  Reviewed, no issues  Last BM:  03/13/2015  Height:   Ht Readings from Last 1 Encounters:  03/18/15 5\' 2"  (1.575 m)    Weight:   Wt Readings from Last 1 Encounters:  03/18/15 88 lb 2.9 oz (40 kg)    Ideal Body Weight:  50 kg  BMI:  Body mass index is 16.12 kg/(m^2).  Estimated Nutritional Needs:   Kcal:  1200-1400  Protein:   55-70 grams   Fluid:  1.2-1.4 L   EDUCATION NEEDS:   No education needs identified at this time  SwedenBrittany Habiba Treloar, Dietetic Intern Pager: [phone]

## 2015-03-20 DIAGNOSIS — E43 Unspecified severe protein-calorie malnutrition: Secondary | ICD-10-CM | POA: Insufficient documentation

## 2015-03-20 LAB — HEPATIC FUNCTION PANEL
ALBUMIN: 3.4 g/dL — AB (ref 3.5–5.0)
ALT: 18 U/L (ref 14–54)
AST: 37 U/L (ref 15–41)
Alkaline Phosphatase: 104 U/L (ref 38–126)
Bilirubin, Direct: <0.1 mg/dL — ABNORMAL LOW (ref 0.1–0.5)
TOTAL PROTEIN: 6.2 g/dL — AB (ref 6.5–8.1)
Total Bilirubin: 0.6 mg/dL (ref 0.3–1.2)

## 2015-03-20 LAB — BASIC METABOLIC PANEL
Anion gap: 13 (ref 5–15)
BUN: 34 mg/dL — AB (ref 6–20)
CHLORIDE: 108 mmol/L (ref 101–111)
CO2: 18 mmol/L — ABNORMAL LOW (ref 22–32)
Calcium: 10.5 mg/dL — ABNORMAL HIGH (ref 8.9–10.3)
Creatinine, Ser: 1.98 mg/dL — ABNORMAL HIGH (ref 0.44–1.00)
GFR calc non Af Amer: 23 mL/min — ABNORMAL LOW (ref >60)
GFR, EST AFRICAN AMERICAN: 26 mL/min — AB (ref >60)
Glucose, Bld: 143 mg/dL — ABNORMAL HIGH (ref 65–99)
POTASSIUM: 4.7 mmol/L (ref 3.5–5.1)
SODIUM: 139 mmol/L (ref 135–145)

## 2015-03-20 LAB — CBC
HEMATOCRIT: 35.5 % — AB (ref 36.0–46.0)
HEMOGLOBIN: 12 g/dL (ref 12.0–15.0)
MCH: 34 pg (ref 26.0–34.0)
MCHC: 33.8 g/dL (ref 30.0–36.0)
MCV: 100.6 fL — ABNORMAL HIGH (ref 78.0–100.0)
Platelets: 166 10*3/uL (ref 150–400)
RBC: 3.53 MIL/uL — ABNORMAL LOW (ref 3.87–5.11)
RDW: 14 % (ref 11.5–15.5)
WBC: 2.7 10*3/uL — AB (ref 4.0–10.5)

## 2015-03-20 LAB — LIPASE, BLOOD: LIPASE: 23 U/L (ref 11–51)

## 2015-03-20 LAB — PROTIME-INR
INR: 1.16 (ref 0.00–1.49)
PROTHROMBIN TIME: 14.9 s (ref 11.6–15.2)

## 2015-03-20 LAB — HEPARIN ANTI-XA: Heparin LMW: 0.57 IU/mL

## 2015-03-20 MED ORDER — POLYETHYLENE GLYCOL 3350 17 G PO PACK: 17.0000 g | PACK | Freq: Every day | ORAL | Status: DC

## 2015-03-20 MED ORDER — AZITHROMYCIN 500 MG PO TABS
500.0000 mg | ORAL_TABLET | Freq: Every day | ORAL | Status: DC
  Administered 2015-03-20: 500 mg via ORAL
  Filled 2015-03-20: qty 1

## 2015-03-20 MED ORDER — ENOXAPARIN SODIUM 40 MG/0.4ML ~~LOC~~ SOLN: 40.0000 mg | SUBCUTANEOUS | Status: DC

## 2015-03-20 MED ORDER — POLYETHYLENE GLYCOL 3350 17 G PO PACK
17.0000 g | PACK | Freq: Two times a day (BID) | ORAL | Status: DC
  Administered 2015-03-20: 17 g via ORAL
  Filled 2015-03-20: qty 1

## 2015-03-20 MED ORDER — PREDNISONE 20 MG PO TABS: ORAL_TABLET | ORAL | Status: DC

## 2015-03-20 MED ORDER — WARFARIN SODIUM 2 MG PO TABS: 2.0000 mg | ORAL_TABLET | Freq: Every day | ORAL | Status: DC

## 2015-03-20 MED ORDER — GI COCKTAIL ~~LOC~~
30.0000 mL | Freq: Once | ORAL | Status: AC
  Administered 2015-03-20: 30 mL via ORAL
  Filled 2015-03-20: qty 30

## 2015-03-20 MED ORDER — AZITHROMYCIN 500 MG PO TABS
500.0000 mg | ORAL_TABLET | Freq: Every day | ORAL | Status: DC
Start: 2015-03-20 — End: 2015-05-16

## 2015-03-20 MED ORDER — WARFARIN SODIUM 2 MG PO TABS
2.0000 mg | ORAL_TABLET | Freq: Once | ORAL | Status: DC
  Filled 2015-03-20: qty 1

## 2015-03-20 MED ORDER — SODIUM BICARBONATE 650 MG PO TABS: 650.0000 mg | ORAL_TABLET | Freq: Three times a day (TID) | ORAL | Status: DC

## 2015-03-20 MED ORDER — PREDNISONE 50 MG PO TABS: 60.0000 mg | ORAL_TABLET | Freq: Every day | ORAL | Status: DC

## 2015-03-20 NOTE — Discharge Instructions (Signed)
Deep Vein Thrombosis °A deep vein thrombosis (DVT) is a blood clot (thrombus) that usually occurs in a deep, larger vein of the lower leg or the pelvis, or in an upper extremity such as the arm. These are dangerous and can lead to serious and even life-threatening complications if the clot travels to the lungs. °A DVT can damage the valves in your leg veins so that instead of flowing upward, the blood pools in the lower leg. This is called post-thrombotic syndrome, and it can result in pain, swelling, discoloration, and sores on the leg. °CAUSES °A DVT is caused by the formation of a blood clot in your leg, pelvis, or arm. Usually, several things contribute to the formation of blood clots. A clot may develop when: °· Your blood flow slows down. °· Your vein becomes damaged in some way. °· You have a condition that makes your blood clot more easily. °RISK FACTORS °A DVT is more likely to develop in: °· People who are older, especially over 60 years of age. °· People who are overweight (obese). °· People who sit or lie still for a long time, such as during long-distance travel (over 4 hours), bed rest, hospitalization, or during recovery from certain medical conditions like a stroke. °· People who do not engage in much physical activity (sedentary lifestyle). °· People who have chronic breathing disorders. °· People who have a personal or family history of blood clots or blood clotting disease. °· People who have peripheral vascular disease (PVD), diabetes, or some types of cancer. °· People who have heart disease, especially if the person had a recent heart attack or has congestive heart failure. °· People who have neurological diseases that affect the legs (leg paresis). °· People who have had a traumatic injury, such as breaking a hip or leg. °· People who have recently had major or lengthy surgery, especially on the hip, knee, or abdomen. °· People who have had a central line placed inside a large vein. °· People  who take medicines that contain the hormone estrogen. These include birth control pills and hormone replacement therapy. °· Pregnancy or during childbirth or the postpartum period. °· Long plane flights (over 8 hours). °SIGNS AND SYMPTOMS °Symptoms of a DVT can include:  °· Swelling of your leg or arm, especially if one side is much worse. °· Warmth and redness of your leg or arm, especially if one side is much worse. °· Pain in your arm or leg. If the clot is in your leg, symptoms may be more noticeable or worse when you stand or walk. °· A feeling of pins and needles, if the clot is in the arm. °The symptoms of a DVT that has traveled to the lungs (pulmonary embolism, PE) usually start suddenly and include: °· Shortness of breath while active or at rest. °· Coughing or coughing up blood or blood-tinged mucus. °· Chest pain that is often worse with deep breaths. °· Rapid or irregular heartbeat. °· Feeling light-headed or dizzy. °· Fainting. °· Feeling anxious. °· Sweating. °There may also be pain and swelling in a leg if that is where the blood clot started. °These symptoms may represent a serious problem that is an emergency. Do not wait to see if the symptoms will go away. Get medical help right away. Call your local emergency services (911 in the [place] Do not drive yourself to the hospital. °DIAGNOSIS °Your health care provider will take a medical history and perform a physical exam. You may also   have other tests, including: °· Blood tests to assess the clotting properties of your blood. °· Imaging tests, such as CT, ultrasound, MRI, X-ray, and other tests to see if you have clots anywhere in your body. °TREATMENT °After a DVT is identified, it can be treated. The type of treatment that you receive depends on many factors, such as the cause of your DVT, your risk for bleeding or developing more clots, and other medical conditions that you have. Sometimes, a combination of treatments is necessary. Treatment  options may be combined and include: °· Monitoring the blood clot with ultrasound. °· Taking medicines by mouth, such as newer blood thinners (anticoagulants), thrombolytics, or warfarin. °· Taking anticoagulant medicine by injection or through an IV tube. °· Wearing compression stockings or using different types of devices. °· Surgery (rare) to remove the blood clot or to place a filter in your abdomen to stop the blood clot from traveling to your lungs. °Treatments for a DVT are often divided into immediate treatment and long-term treatment (up to 3 months after DVT). You can work with your health care provider to choose the treatment program that is best for you. °HOME CARE INSTRUCTIONS °If you are taking a newer oral anticoagulant: °· Take the medicine every single day at the same time each day. °· Understand what foods and drugs interact with this medicine. °· Understand that there are no regular blood tests required when using this medicine. °· Understand the side effects of this medicine, including excessive bruising or bleeding. Ask your health care provider or pharmacist about other possible side effects. °If you are taking warfarin: °· Understand how to take warfarin and know which foods can affect how warfarin works in your body. °· Understand that it is dangerous to take too much or too little warfarin. Too much warfarin increases the risk of bleeding. Too little warfarin continues to allow the risk for blood clots. °· Follow your PT and INR blood testing schedule. The PT and INR results allow your health care provider to adjust your dose of warfarin. It is very important that you have your PT and INR tested as often as told by your health care provider. °· Avoid major changes in your diet, or tell your health care provider before you change your diet. Arrange a visit with a registered dietitian to answer your questions. Many foods, especially foods that are high in vitamin K, can interfere with warfarin  and affect the PT and INR results. Eat a consistent amount of foods that are high in vitamin K, such as: °¨ Spinach, kale, broccoli, cabbage, collard greens, turnip greens, Brussels sprouts, peas, cauliflower, seaweed, and parsley. °¨ Beef liver and pork liver. °¨ Green tea. °¨ Soybean oil. °· Tell your health care provider about any and all medicines, vitamins, and supplements that you take, including aspirin and other over-the-counter anti-inflammatory medicines. Be especially cautious with aspirin and anti-inflammatory medicines. Do not take those before you ask your health care provider if it is safe to do so. This is important because many medicines can interfere with warfarin and affect the PT and INR results. °· Do not start or stop taking any over-the-counter or prescription medicine unless your health care provider or pharmacist tells you to do so. °If you take warfarin, you will also need to do these things: °· Hold pressure over cuts for longer than usual. °· Tell your dentist and other health care providers that you are taking warfarin before you have any procedures in which   bleeding may occur. °· Avoid alcohol or drink very small amounts. Tell your health care provider if you change your alcohol intake. °· Do not use tobacco products, including cigarettes, chewing tobacco, and e-cigarettes. If you need help quitting, ask your health care provider. °· Avoid contact sports. °General Instructions °· Take over-the-counter and prescription medicines only as told by your health care provider. Anticoagulant medicines can have side effects, including easy bruising and difficulty stopping bleeding. If you are prescribed an anticoagulant, you will also need to do these things: °¨ Hold pressure over cuts for longer than usual. °¨ Tell your dentist and other health care providers that you are taking anticoagulants before you have any procedures in which bleeding may occur. °¨ Avoid contact sports. °· Wear a medical  alert bracelet or carry a medical alert card that says you have had a PE. °· Ask your health care provider how soon you can go back to your normal activities. Stay active to prevent new blood clots from forming. °· Make sure to exercise while traveling or when you have been sitting or standing for a long period of time. It is very important to exercise. Exercise your legs by walking or by tightening and relaxing your leg muscles often. Take frequent walks. °· Wear compression stockings as told by your health care provider to help prevent more blood clots from forming. °· Do not use tobacco products, including cigarettes, chewing tobacco, and e-cigarettes. If you need help quitting, ask your health care provider. °· Keep all follow-up appointments with your health care provider. This is important. °PREVENTION °Take these actions to decrease your risk of developing another DVT: °· Exercise regularly. For at least 30 minutes every day, engage in: °¨ Activity that involves moving your arms and legs. °¨ Activity that encourages good blood flow through your body by increasing your heart rate. °· Exercise your arms and legs every hour during long-distance travel (over 4 hours). Drink plenty of water and avoid drinking alcohol while traveling. °· Avoid sitting or lying in bed for long periods of time without moving your legs. °· Maintain a weight that is appropriate for your height. Ask your health care provider what weight is healthy for you. °· If you are a woman who is over 35 years of age, avoid unnecessary use of medicines that contain estrogen. These include birth control pills. °· Do not smoke, especially if you take estrogen medicines. If you need help quitting, ask your health care provider. °If you are hospitalized, prevention measures may include: °· Early walking after surgery, as soon as your health care provider says that it is safe. °· Receiving anticoagulants to prevent blood clots. If you cannot take  anticoagulants, other options may be available, such as wearing compression stockings or using different types of devices. °SEEK IMMEDIATE MEDICAL CARE IF: °· You have new or increased pain, swelling, or redness in an arm or leg. °· You have numbness or tingling in an arm or leg. °· You have shortness of breath while active or at rest. °· You have chest pain. °· You have a rapid or irregular heartbeat. °· You feel light-headed or dizzy. °· You cough up blood. °· You notice blood in your vomit, bowel movement, or urine. °These symptoms may represent a serious problem that is an emergency. Do not wait to see if the symptoms will go away. Get medical help right away. Call your local emergency services (911 in the [place] Do not drive yourself to the hospital. °  °  This information is not intended to replace advice given to you by your health care provider. Make sure you discuss any questions you have with your health care provider. °  °Document Released: 12/28/2004 Document Revised: 09/18/2014 Document Reviewed: 04/24/2014 °Elsevier Interactive Patient Education ©2016 [employer] ° °

## 2015-03-20 NOTE — Progress Notes (Signed)
[facility] CM spoke with pt regarding home health services and pt agreed to received services provided by [facility] after choice was given. Referral made with [facility] @ [phone] for HHRN/PT. Gae Gallopngela Talyia Allende RN,BSN,CM [phone]

## 2015-03-20 NOTE — Progress Notes (Signed)
ANTICOAGULATION CONSULT NOTE - Follow-up Consult  Pharmacy Consult for Coumadin and Lovenox Indication: DVT  No Known Allergies  Patient Measurements: Height: 5\' 2"  (157.5 cm) Weight: 88 lb 2.9 oz (40 kg) IBW/kg (Calculated) : 50.1  Vital Signs: Temp: 98.5 F (36.9 C) (03/08 2136) Temp Source: Oral (03/08 2136) BP: 134/80 mmHg (03/08 2136) Pulse Rate: 113 (03/08 2136)  Labs:  Recent Labs  03/18/15 1005 03/18/15 1837 03/19/15 0445 03/20/15 0139  HGB 12.1  --  12.2 12.0  HCT 37.3  --  37.3 35.5*  PLT 144*  --  146* 166  LABPROT  --  13.6  --  14.9  INR  --  1.02  --  1.16  HEPRLOWMOCWT  --   --   --  0.57  CREATININE 1.99*  --  1.91* 1.98*    Estimated Creatinine Clearance: 14.5 mL/min (by C-G formula based on Cr of 1.98).   Medications:  None pta - completed 6 months of warfarin treatment for prior DVTs Lovenox 30mg  SQ daily this admission - last dose 03/18/15 at 20:58 PM  Assessment: 82 year old female with history of bilateral DVTs (2015) and CKD-stage IV not on anticoagulation prior to admission, now admitted for SOB and found to have mixed acute and chronic DVT of R-popliteal and L-common femoral and popliteal veins to start full dose Lovenox and Coumadin bridge with possible discharge soon.   INR with some movement post first dose of coumadin.  Note that this is a patient with low body weight and CKD - current CrCl ~ 15.1 mL/min. Platelets are low at 144 on admission and currently 146.   Low molecular weight level 0.57 units/ml (4 hours post 2nd dose of Lovenox) - there will be some more accumulation with repeated dosing so should be therapeutic.  Goal of Therapy:  INR 2-3 Anti-Xa level 0.6-1 units/ml 4hrs after LMWH dose given  Monitor platelets by anticoagulation protocol: Yes   Plan:  Coumadin 2mg  po x1 tonight. Daily PT/INR.  Lovenox 40 mg SQ daily -dose adjusted for renal function.  F/u Scr and CBC  [name] Alexander, PharmD, BCPS Clinical pharmacist,  pager [phone] 03/20/2015,4:07 AM

## 2015-03-20 NOTE — Progress Notes (Signed)
Pt given discharge instructions, prescriptions, and care notes. Pt verbalized understanding AEB no further questions or concerns at this time. IV was discontinued, no redness, pain, or swelling noted at this time. Telemetry discontinued and Centralized Telemetry was notified. Pt left the floor via wheelchair with staff in stable condition. 

## 2015-03-20 NOTE — Progress Notes (Signed)
Pharmacist Provided - Patient Medication Education Prior to Discharge   [name] Alexander is an 82 y.o. female who [facility] with a chief complaint of  Chief Complaint  Patient presents with  . Fatigue     [x]  Patient will be discharged with new medications []  Patient being discharged without any new medications  The following medications were discussed with the patient:  Pain Control medications: []  Yes    [x]  No  Diabetes Medications: []  Yes    [x]  No  Heart Failure Medications: []  Yes    [x]  No  Anticoagulation Medications:  [x]  Yes    []  No  Antibiotics at discharge: [x]  Yes    []  No  Allergy Assessment Completed and Updated: []  Yes    [x]  No Identified Patient Allergies: No Known Allergies   Medication Adherence Assessment: []  Excellent (no doses missed/week)      [x]  Good (1 dose missed/week)      []  Partial (2-3 doses missed/week)      []  Poor (>3 doses missed/week)  Barriers to Obtaining Medications: []  Yes [x]  No  Assessment: [name] with respiratory distress likely due to COPD.  Discussed COPD and discharge plan with antibiotics and prednisone. Also reinforced anticoagulation counseling. Pt was groggy during interview but was receptive to care plan.  Time spent preparing for discharge counseling: 15 min Time spent counseling patient: 15 min  [place] PinkGazda, Kellie Murrill P, PharmD 03/20/2015, 4:19 PM

## 2015-03-20 NOTE — Discharge Summary (Signed)
[facility]  Physician Discharge Summary   Patient ID: [name] Alexander MRN: [id number] DOB/AGE: [birth year] 82 y.o.  Admit date: 03/18/2015 Discharge date: 03/20/2015  PCP: [name], MD  DISCHARGE DIAGNOSES:  Principal Problem:   Dyspnea Active Problems:   Hypertension   Hyperthyroidism   COPD exacerbation (HCC)   CKD (chronic kidney disease), stage IV (HCC)   Acute respiratory failure with hypoxia (HCC)   Tobacco abuse   Protein-calorie malnutrition, severe   RECOMMENDATIONS FOR OUTPATIENT FOLLOW UP: 1. Patient being discharged on Lovenox injections as well as warfarin. PT/INR to be checked by home health on Monday and then further recommendations regarding Lovenox to be made at that time.   DISCHARGE CONDITION: fair  Diet recommendation: As before  Filed Weights   03/18/15 1817  Weight: 40 kg (88 lb 2.9 oz)    INITIAL HISTORY: [name] Alexander is a 82 y.o. female with a past medical history not limited to hypertension, diastolic heart failure, hyperthyroidism, COPD, DVT and chronic kidney disease stage IV. Patient is not on home O2 she normally gets around well with her walker. Patient presented with shortness of breath and cough for a few days.   Consultations:  None  Procedures: Lower extremity venous Doppler Lower extremity venous duplex has been completed. Preliminary findings: Technically limited study due to patient position. Findings consistent with mixed acute and chronic deep vein thrombosis involving the right popliteal vein, left common femoral vein, left femoral vein, and left popliteal vein. Findings consistent with age indeterminate superficial vein thrombosis involving the right small saphenous vein and left small saphenous vein.    HOSPITAL COURSE:   Acute hypoxic respiratory failure Most likely secondary to COPD exacerbation. VQ scan was indeterminate. However, patient has been found to have lower extremity DVT, so venous  thromboembolism cannot be entirely ruled out. See below. Patient has shown improvement. She will be discharged on a tapering dose of steroid and a few more days of antibiotics. She is saturating normal on room air.  Acute lower extremity DVT  Patient was diagnosed with bilateral DVTs in 2015. She was placed on warfarin at that time. I was told by the patient's Son that the warfarin was discontinued after 6 months of treatment. Patient is noted to have lower extremity swelling. Doppler studies were ordered. Acute and chronic DVTs noted in the lower legs. She she was placed back on warfarin. Renally adjusted Lovenox for bridging purposes. Cannot use newer agents due to chronic renal failure. Home health to check PT INR and renal function on Monday.  Urinary frequency UA essentially unremarkable.  History of diastolic dysfunction on echo 2015.  No evidence for decompensation.  Essential Hypertension Continue home Norvasc  Chronic kidney disease, stage IV Cr at baseline. Avoid nephrotoxic medications. Noted to have metabolic acidosis. Started on sodium bicarbonate.  Tobacco abuse Patient tells me that she has stopped smoking. However, according to son, she continues to smoke.   Constipation, chronic Continue home senekot. MiraLAX added. She did have some epigastric abdominal discomfort this morning. Lipase as well as LFTs and normal. She could have some gastritis. She is already on PPI. She was given GI cocktail with good response. She feels much better. She tolerated her lunch.  Hyperthyroidism Continue Tapazole. TSH is normal.  Gout. Continue allopurinol.  Chronic thrombocytopenia Stable  Overall improved. Discussed in detail with the patient and her son this morning. They are able to give her the Lovenox injections. Home health has been ordered. Patient's abdominal pain  has resolved. Okay for discharge.    PERTINENT LABS:  The results of significant diagnostics from this  hospitalization (including imaging, microbiology, ancillary and laboratory) are listed below for reference.     Labs: Basic Metabolic Panel:  Recent Labs Lab 03/18/15 1005 03/19/15 0445 03/20/15 0139  NA 141 140 139  K 4.3 4.5 4.7  CL 115* 114* 108  CO2 17* 16* 18*  GLUCOSE 123* 148* 143*  BUN 30* 30* 34*  CREATININE 1.99* 1.91* 1.98*  CALCIUM 10.4* 10.4* 10.5*   Liver Function Tests:  Recent Labs Lab 03/18/15 1005 03/20/15 0139  AST 31 37  ALT 16 18  ALKPHOS 124 104  BILITOT 0.5 0.6  PROT 7.1 6.2*  ALBUMIN 3.7 3.4*    Recent Labs Lab 03/20/15 0139  LIPASE 23   CBC:  Recent Labs Lab 03/18/15 1005 03/19/15 0445 03/20/15 0139  WBC 3.6* 1.9* 2.7*  NEUTROABS 2.3  --   --   HGB 12.1 12.2 12.0  HCT 37.3 37.3 35.5*  MCV 102.2* 101.1* 100.6*  PLT 144* 146* 166   BNP: BNP (last 3 results)  Recent Labs  03/18/15 1837  BNP 571.4*    IMAGING STUDIES Dg Chest 2 View  03/18/2015  CLINICAL DATA:  Shortness of breath with productive cough and fever for 2 or 3 days. History of hypertension. Ex-smoker. EXAM: CHEST  2 VIEW COMPARISON:  Radiographs 12/12/2013.  CT 04/30/2013. FINDINGS: Stable mild cardiomegaly and diffuse atherosclerosis per the pulmonary vascularity is normal. No edema, confluent airspace opacity or significant pleural effusion. There is mild chronic central airway thickening. The bones appear unchanged. IMPRESSION: Stable cardiomegaly and chronic central airway thickening. No acute findings identified. Electronically Signed   By: Carey Bullocks M.D.   On: 03/18/2015 10:42   Nm Pulmonary Perf And Vent  03/18/2015  CLINICAL DATA:  Shortness of breath. History bilateral lower extremity DVTs. EXAM: NUCLEAR MEDICINE VENTILATION - PERFUSION LUNG SCAN TECHNIQUE: Ventilation images were obtained in multiple projections using inhaled aerosol Tc-62m DTPA. Perfusion images were obtained in multiple projections after intravenous injection of Tc-66m MAA.  RADIOPHARMACEUTICALS:  34.0 Technetium-29m DTPA aerosol inhalation and 4.2 Technetium-53m MAA IV COMPARISON:  Chest x-ray 03/18/2015. FINDINGS: Ventilation: Severe ventilatory defects are noted bilaterally. Perfusion: Small perfusion defects are noted bilaterally. IMPRESSION: Technically indeterminate scan. However ventilatory defects are much more diffuse and severe than small perfusion defects present. Findings are most likely related to COPD. Electronically Signed   By: Maisie Fus  Register   On: 03/18/2015 13:45   Dg Abd Portable 1v  03/19/2015  CLINICAL DATA:  Abdominal pain for 1 day. EXAM: PORTABLE ABDOMEN - 1 VIEW COMPARISON:  CT 12/12/2013 FINDINGS: The bowel gas pattern is normal. No dilated bowel loops. Moderate stool burden. No radio-opaque calculi. There are atherosclerotic calcifications. Chronic changes of both hips, advanced involving the left proximal femur. Lung bases are clear. IMPRESSION: Normal bowel gas pattern without evidence of obstruction. Electronically Signed   By: Rubye Oaks M.D.   On: 03/19/2015 21:33    DISCHARGE EXAMINATION: Filed Vitals:   03/19/15 2136 03/20/15 0529 03/20/15 0920 03/20/15 1438  BP: 134/80 141/82 154/97 134/77  Pulse: 113 72  102  Temp: 98.5 F (36.9 C) 99.3 F (37.4 C)  98.4 F (36.9 C)  TempSrc: Oral Oral  Oral  Resp: Height:      Weight:      SpO2: 98% 98%  100%   General appearance: alert, cooperative, appears stated age and no  distress Resp: clear to auscultation bilaterally Cardio: regular rate and rhythm, S1, S2 normal, no murmur, click, rub or gallop GI: Mildly tender in the epigastric area without any rebound, rigidity or guarding. No masses or organomegaly. Extremities: extremities normal, atraumatic, no cyanosis or edema  DISPOSITION: Home with son  Discharge Instructions    Call MD for:  difficulty breathing, headache or visual disturbances    Complete by:  As directed      Call MD for:  extreme fatigue     Complete by:  As directed      Call MD for:  persistant dizziness or light-headedness    Complete by:  As directed      Call MD for:  persistant nausea and vomiting    Complete by:  As directed      Call MD for:  severe uncontrolled pain    Complete by:  As directed      Call MD for:  temperature >100.4    Complete by:  As directed      Diet - low sodium heart healthy    Complete by:  As directed      Discharge instructions    Complete by:  As directed   Take medications as prescribed. Blood work to be done by home health on Monday. Please call your doctor if your abdominal pain recurs.  You were cared for by a hospitalist during your hospital stay. If you have any questions about your discharge medications or the care you received while you were in the hospital after you are discharged, you can call the unit and asked to speak with the hospitalist on call if the hospitalist that took care of you is not available. Once you are discharged, your primary care physician will handle any further medical issues. Please note that NO REFILLS for any discharge medications will be authorized once you are discharged, as it is imperative that you return to your primary care physician (or establish a relationship with a primary care physician if you do not have one) for your aftercare needs so that they can reassess your need for medications and monitor your lab values. If you do not have a primary care physician, you can call [phone] for a physician referral.     Increase activity slowly    Complete by:  As directed            ALLERGIES: No Known Allergies   Current Discharge Medication List    START taking these medications   Details  azithromycin (ZITHROMAX) 500 MG tablet Take 1 tablet (500 mg total) by mouth daily. Qty: 3 tablet, Refills: 0    enoxaparin (LOVENOX) 40 MG/0.4ML injection Inject 0.4 mLs (40 mg total) into the skin daily. Qty: 5 Syringe, Refills: 0    polyethylene glycol  (MIRALAX / GLYCOLAX) packet Take 17 g by mouth daily. Qty: 14 each, Refills: 0    predniSONE (DELTASONE) 20 MG tablet Take 3 tablets once daily for 3 days, then take 2 tablets once daily for 3 days, then take 1 tablets once daily for 3 days, then STOP. Qty: 18 tablet, Refills: 0    sodium bicarbonate 650 MG tablet Take 1 tablet (650 mg total) by mouth 3 (three) times daily. Qty: 90 tablet, Refills: 0    warfarin (COUMADIN) 2 MG tablet Take 1 tablet (2 mg total) by mouth daily at 6 PM. Qty: 30 tablet, Refills: 1      CONTINUE these medications which have NOT CHANGED  Details  allopurinol (ZYLOPRIM) 100 MG tablet Take 100 mg by mouth every morning.    amLODipine (NORVASC) 5 MG tablet Take 5 mg by mouth daily.    feeding supplement, ENSURE COMPLETE, (ENSURE COMPLETE) LIQD Take 237 mLs by mouth 2 (two) times daily between meals. Qty: 60 Bottle, Refills: 0    methimazole (TAPAZOLE) 5 MG tablet Take 5 mg by mouth every morning.     omeprazole (PRILOSEC) 40 MG capsule Take 1 capsule by mouth 2 (two) times daily.    senna (SENOKOT) 8.6 MG TABS tablet Take 1 tablet (8.6 mg total) by mouth daily. Qty: 30 each, Refills: 0    traMADol (ULTRAM) 50 MG tablet Take 50-100 mg by mouth every 6 (six) hours as needed for moderate pain.        Follow-up Information    Follow up with Laurena SlimmerLARK,[name], MD. Schedule an appointment as soon as possible for a visit in 1 week.   Specialty:  Internal Medicine   Why:  post hospitalization follow up   Contact information:   [address] [place] [address] [phone]       Follow up with [facility].   Specialty:  Home Health Services   Why:  Home Health  RN and PT arranged   Contact information:   [address] [place] [address] [phone]       TOTAL DISCHARGE TIME: 35 minutes  [facility] CityKRISHNAN,Dazha Kempa  [facility] Pager [phone]  03/20/2015, 4:01 PM

## 2015-03-24 DIAGNOSIS — N184 Chronic kidney disease, stage 4 (severe): Secondary | ICD-10-CM | POA: Diagnosis not present

## 2015-03-24 DIAGNOSIS — J449 Chronic obstructive pulmonary disease, unspecified: Secondary | ICD-10-CM | POA: Diagnosis not present

## 2015-03-24 DIAGNOSIS — Z7952 Long term (current) use of systemic steroids: Secondary | ICD-10-CM | POA: Diagnosis not present

## 2015-03-24 DIAGNOSIS — E43 Unspecified severe protein-calorie malnutrition: Secondary | ICD-10-CM | POA: Diagnosis not present

## 2015-03-24 DIAGNOSIS — Z7901 Long term (current) use of anticoagulants: Secondary | ICD-10-CM | POA: Diagnosis not present

## 2015-03-24 DIAGNOSIS — I10 Essential (primary) hypertension: Secondary | ICD-10-CM | POA: Diagnosis not present

## 2015-03-24 DIAGNOSIS — I5030 Unspecified diastolic (congestive) heart failure: Secondary | ICD-10-CM | POA: Diagnosis not present

## 2015-03-24 DIAGNOSIS — I82403 Acute embolism and thrombosis of unspecified deep veins of lower extremity, bilateral: Secondary | ICD-10-CM | POA: Diagnosis not present

## 2015-03-24 DIAGNOSIS — E039 Hypothyroidism, unspecified: Secondary | ICD-10-CM | POA: Diagnosis not present

## 2015-03-24 DIAGNOSIS — J441 Chronic obstructive pulmonary disease with (acute) exacerbation: Secondary | ICD-10-CM | POA: Diagnosis not present

## 2015-03-24 DIAGNOSIS — Z5181 Encounter for therapeutic drug level monitoring: Secondary | ICD-10-CM | POA: Diagnosis not present

## 2015-03-24 DIAGNOSIS — I130 Hypertensive heart and chronic kidney disease with heart failure and stage 1 through stage 4 chronic kidney disease, or unspecified chronic kidney disease: Secondary | ICD-10-CM | POA: Diagnosis not present

## 2015-03-27 DIAGNOSIS — Z7952 Long term (current) use of systemic steroids: Secondary | ICD-10-CM | POA: Diagnosis not present

## 2015-03-27 DIAGNOSIS — J441 Chronic obstructive pulmonary disease with (acute) exacerbation: Secondary | ICD-10-CM | POA: Diagnosis not present

## 2015-03-27 DIAGNOSIS — I82403 Acute embolism and thrombosis of unspecified deep veins of lower extremity, bilateral: Secondary | ICD-10-CM | POA: Diagnosis not present

## 2015-03-27 DIAGNOSIS — I130 Hypertensive heart and chronic kidney disease with heart failure and stage 1 through stage 4 chronic kidney disease, or unspecified chronic kidney disease: Secondary | ICD-10-CM | POA: Diagnosis not present

## 2015-03-27 DIAGNOSIS — I5030 Unspecified diastolic (congestive) heart failure: Secondary | ICD-10-CM | POA: Diagnosis not present

## 2015-03-27 DIAGNOSIS — Z7901 Long term (current) use of anticoagulants: Secondary | ICD-10-CM | POA: Diagnosis not present

## 2015-03-27 DIAGNOSIS — E43 Unspecified severe protein-calorie malnutrition: Secondary | ICD-10-CM | POA: Diagnosis not present

## 2015-03-27 DIAGNOSIS — N184 Chronic kidney disease, stage 4 (severe): Secondary | ICD-10-CM | POA: Diagnosis not present

## 2015-03-27 DIAGNOSIS — E039 Hypothyroidism, unspecified: Secondary | ICD-10-CM | POA: Diagnosis not present

## 2015-03-27 DIAGNOSIS — Z5181 Encounter for therapeutic drug level monitoring: Secondary | ICD-10-CM | POA: Diagnosis not present

## 2015-03-31 DIAGNOSIS — I10 Essential (primary) hypertension: Secondary | ICD-10-CM | POA: Diagnosis not present

## 2015-03-31 DIAGNOSIS — Z7901 Long term (current) use of anticoagulants: Secondary | ICD-10-CM | POA: Diagnosis not present

## 2015-03-31 DIAGNOSIS — E049 Nontoxic goiter, unspecified: Secondary | ICD-10-CM | POA: Diagnosis not present

## 2015-03-31 DIAGNOSIS — E0520 Thyrotoxicosis with toxic multinodular goiter without thyrotoxic crisis or storm: Secondary | ICD-10-CM | POA: Diagnosis not present

## 2015-03-31 DIAGNOSIS — J449 Chronic obstructive pulmonary disease, unspecified: Secondary | ICD-10-CM | POA: Diagnosis not present

## 2015-03-31 DIAGNOSIS — I825Y9 Chronic embolism and thrombosis of unspecified deep veins of unspecified proximal lower extremity: Secondary | ICD-10-CM | POA: Diagnosis not present

## 2015-04-01 DIAGNOSIS — N184 Chronic kidney disease, stage 4 (severe): Secondary | ICD-10-CM | POA: Diagnosis not present

## 2015-04-01 DIAGNOSIS — I5030 Unspecified diastolic (congestive) heart failure: Secondary | ICD-10-CM | POA: Diagnosis not present

## 2015-04-01 DIAGNOSIS — E43 Unspecified severe protein-calorie malnutrition: Secondary | ICD-10-CM | POA: Diagnosis not present

## 2015-04-01 DIAGNOSIS — I130 Hypertensive heart and chronic kidney disease with heart failure and stage 1 through stage 4 chronic kidney disease, or unspecified chronic kidney disease: Secondary | ICD-10-CM | POA: Diagnosis not present

## 2015-04-01 DIAGNOSIS — Z7952 Long term (current) use of systemic steroids: Secondary | ICD-10-CM | POA: Diagnosis not present

## 2015-04-01 DIAGNOSIS — Z5181 Encounter for therapeutic drug level monitoring: Secondary | ICD-10-CM | POA: Diagnosis not present

## 2015-04-01 DIAGNOSIS — E039 Hypothyroidism, unspecified: Secondary | ICD-10-CM | POA: Diagnosis not present

## 2015-04-01 DIAGNOSIS — I82403 Acute embolism and thrombosis of unspecified deep veins of lower extremity, bilateral: Secondary | ICD-10-CM | POA: Diagnosis not present

## 2015-04-01 DIAGNOSIS — J441 Chronic obstructive pulmonary disease with (acute) exacerbation: Secondary | ICD-10-CM | POA: Diagnosis not present

## 2015-04-01 DIAGNOSIS — Z7901 Long term (current) use of anticoagulants: Secondary | ICD-10-CM | POA: Diagnosis not present

## 2015-04-03 DIAGNOSIS — Z7901 Long term (current) use of anticoagulants: Secondary | ICD-10-CM | POA: Diagnosis not present

## 2015-04-03 DIAGNOSIS — E039 Hypothyroidism, unspecified: Secondary | ICD-10-CM | POA: Diagnosis not present

## 2015-04-03 DIAGNOSIS — N184 Chronic kidney disease, stage 4 (severe): Secondary | ICD-10-CM | POA: Diagnosis not present

## 2015-04-03 DIAGNOSIS — Z7952 Long term (current) use of systemic steroids: Secondary | ICD-10-CM | POA: Diagnosis not present

## 2015-04-03 DIAGNOSIS — Z5181 Encounter for therapeutic drug level monitoring: Secondary | ICD-10-CM | POA: Diagnosis not present

## 2015-04-03 DIAGNOSIS — J441 Chronic obstructive pulmonary disease with (acute) exacerbation: Secondary | ICD-10-CM | POA: Diagnosis not present

## 2015-04-03 DIAGNOSIS — I82403 Acute embolism and thrombosis of unspecified deep veins of lower extremity, bilateral: Secondary | ICD-10-CM | POA: Diagnosis not present

## 2015-04-03 DIAGNOSIS — I5030 Unspecified diastolic (congestive) heart failure: Secondary | ICD-10-CM | POA: Diagnosis not present

## 2015-04-03 DIAGNOSIS — I130 Hypertensive heart and chronic kidney disease with heart failure and stage 1 through stage 4 chronic kidney disease, or unspecified chronic kidney disease: Secondary | ICD-10-CM | POA: Diagnosis not present

## 2015-04-03 DIAGNOSIS — E43 Unspecified severe protein-calorie malnutrition: Secondary | ICD-10-CM | POA: Diagnosis not present

## 2015-04-04 DIAGNOSIS — Z7952 Long term (current) use of systemic steroids: Secondary | ICD-10-CM | POA: Diagnosis not present

## 2015-04-04 DIAGNOSIS — E039 Hypothyroidism, unspecified: Secondary | ICD-10-CM | POA: Diagnosis not present

## 2015-04-04 DIAGNOSIS — J441 Chronic obstructive pulmonary disease with (acute) exacerbation: Secondary | ICD-10-CM | POA: Diagnosis not present

## 2015-04-04 DIAGNOSIS — I130 Hypertensive heart and chronic kidney disease with heart failure and stage 1 through stage 4 chronic kidney disease, or unspecified chronic kidney disease: Secondary | ICD-10-CM | POA: Diagnosis not present

## 2015-04-04 DIAGNOSIS — E43 Unspecified severe protein-calorie malnutrition: Secondary | ICD-10-CM | POA: Diagnosis not present

## 2015-04-04 DIAGNOSIS — I82403 Acute embolism and thrombosis of unspecified deep veins of lower extremity, bilateral: Secondary | ICD-10-CM | POA: Diagnosis not present

## 2015-04-04 DIAGNOSIS — I5030 Unspecified diastolic (congestive) heart failure: Secondary | ICD-10-CM | POA: Diagnosis not present

## 2015-04-04 DIAGNOSIS — N184 Chronic kidney disease, stage 4 (severe): Secondary | ICD-10-CM | POA: Diagnosis not present

## 2015-04-04 DIAGNOSIS — Z5181 Encounter for therapeutic drug level monitoring: Secondary | ICD-10-CM | POA: Diagnosis not present

## 2015-04-04 DIAGNOSIS — Z7901 Long term (current) use of anticoagulants: Secondary | ICD-10-CM | POA: Diagnosis not present

## 2015-04-07 DIAGNOSIS — Z7901 Long term (current) use of anticoagulants: Secondary | ICD-10-CM | POA: Diagnosis not present

## 2015-04-07 DIAGNOSIS — I5030 Unspecified diastolic (congestive) heart failure: Secondary | ICD-10-CM | POA: Diagnosis not present

## 2015-04-07 DIAGNOSIS — I130 Hypertensive heart and chronic kidney disease with heart failure and stage 1 through stage 4 chronic kidney disease, or unspecified chronic kidney disease: Secondary | ICD-10-CM | POA: Diagnosis not present

## 2015-04-07 DIAGNOSIS — I82403 Acute embolism and thrombosis of unspecified deep veins of lower extremity, bilateral: Secondary | ICD-10-CM | POA: Diagnosis not present

## 2015-04-07 DIAGNOSIS — Z5181 Encounter for therapeutic drug level monitoring: Secondary | ICD-10-CM | POA: Diagnosis not present

## 2015-04-07 DIAGNOSIS — E039 Hypothyroidism, unspecified: Secondary | ICD-10-CM | POA: Diagnosis not present

## 2015-04-07 DIAGNOSIS — Z7952 Long term (current) use of systemic steroids: Secondary | ICD-10-CM | POA: Diagnosis not present

## 2015-04-07 DIAGNOSIS — J441 Chronic obstructive pulmonary disease with (acute) exacerbation: Secondary | ICD-10-CM | POA: Diagnosis not present

## 2015-04-07 DIAGNOSIS — E43 Unspecified severe protein-calorie malnutrition: Secondary | ICD-10-CM | POA: Diagnosis not present

## 2015-04-07 DIAGNOSIS — N184 Chronic kidney disease, stage 4 (severe): Secondary | ICD-10-CM | POA: Diagnosis not present

## 2015-04-08 DIAGNOSIS — Z7952 Long term (current) use of systemic steroids: Secondary | ICD-10-CM | POA: Diagnosis not present

## 2015-04-08 DIAGNOSIS — I82403 Acute embolism and thrombosis of unspecified deep veins of lower extremity, bilateral: Secondary | ICD-10-CM | POA: Diagnosis not present

## 2015-04-08 DIAGNOSIS — Z7901 Long term (current) use of anticoagulants: Secondary | ICD-10-CM | POA: Diagnosis not present

## 2015-04-08 DIAGNOSIS — E039 Hypothyroidism, unspecified: Secondary | ICD-10-CM | POA: Diagnosis not present

## 2015-04-08 DIAGNOSIS — E43 Unspecified severe protein-calorie malnutrition: Secondary | ICD-10-CM | POA: Diagnosis not present

## 2015-04-08 DIAGNOSIS — I5030 Unspecified diastolic (congestive) heart failure: Secondary | ICD-10-CM | POA: Diagnosis not present

## 2015-04-08 DIAGNOSIS — Z5181 Encounter for therapeutic drug level monitoring: Secondary | ICD-10-CM | POA: Diagnosis not present

## 2015-04-08 DIAGNOSIS — I130 Hypertensive heart and chronic kidney disease with heart failure and stage 1 through stage 4 chronic kidney disease, or unspecified chronic kidney disease: Secondary | ICD-10-CM | POA: Diagnosis not present

## 2015-04-08 DIAGNOSIS — N184 Chronic kidney disease, stage 4 (severe): Secondary | ICD-10-CM | POA: Diagnosis not present

## 2015-04-08 DIAGNOSIS — J441 Chronic obstructive pulmonary disease with (acute) exacerbation: Secondary | ICD-10-CM | POA: Diagnosis not present

## 2015-04-09 DIAGNOSIS — I130 Hypertensive heart and chronic kidney disease with heart failure and stage 1 through stage 4 chronic kidney disease, or unspecified chronic kidney disease: Secondary | ICD-10-CM | POA: Diagnosis not present

## 2015-04-10 DIAGNOSIS — I82403 Acute embolism and thrombosis of unspecified deep veins of lower extremity, bilateral: Secondary | ICD-10-CM | POA: Diagnosis not present

## 2015-04-10 DIAGNOSIS — Z5181 Encounter for therapeutic drug level monitoring: Secondary | ICD-10-CM | POA: Diagnosis not present

## 2015-04-10 DIAGNOSIS — E039 Hypothyroidism, unspecified: Secondary | ICD-10-CM | POA: Diagnosis not present

## 2015-04-10 DIAGNOSIS — E43 Unspecified severe protein-calorie malnutrition: Secondary | ICD-10-CM | POA: Diagnosis not present

## 2015-04-10 DIAGNOSIS — N184 Chronic kidney disease, stage 4 (severe): Secondary | ICD-10-CM | POA: Diagnosis not present

## 2015-04-10 DIAGNOSIS — J441 Chronic obstructive pulmonary disease with (acute) exacerbation: Secondary | ICD-10-CM | POA: Diagnosis not present

## 2015-04-10 DIAGNOSIS — Z7952 Long term (current) use of systemic steroids: Secondary | ICD-10-CM | POA: Diagnosis not present

## 2015-04-10 DIAGNOSIS — I130 Hypertensive heart and chronic kidney disease with heart failure and stage 1 through stage 4 chronic kidney disease, or unspecified chronic kidney disease: Secondary | ICD-10-CM | POA: Diagnosis not present

## 2015-04-10 DIAGNOSIS — Z7901 Long term (current) use of anticoagulants: Secondary | ICD-10-CM | POA: Diagnosis not present

## 2015-04-10 DIAGNOSIS — I5030 Unspecified diastolic (congestive) heart failure: Secondary | ICD-10-CM | POA: Diagnosis not present

## 2015-04-14 DIAGNOSIS — J441 Chronic obstructive pulmonary disease with (acute) exacerbation: Secondary | ICD-10-CM | POA: Diagnosis not present

## 2015-04-14 DIAGNOSIS — E039 Hypothyroidism, unspecified: Secondary | ICD-10-CM | POA: Diagnosis not present

## 2015-04-14 DIAGNOSIS — I82403 Acute embolism and thrombosis of unspecified deep veins of lower extremity, bilateral: Secondary | ICD-10-CM | POA: Diagnosis not present

## 2015-04-14 DIAGNOSIS — Z5181 Encounter for therapeutic drug level monitoring: Secondary | ICD-10-CM | POA: Diagnosis not present

## 2015-04-14 DIAGNOSIS — Z7901 Long term (current) use of anticoagulants: Secondary | ICD-10-CM | POA: Diagnosis not present

## 2015-04-14 DIAGNOSIS — I5030 Unspecified diastolic (congestive) heart failure: Secondary | ICD-10-CM | POA: Diagnosis not present

## 2015-04-14 DIAGNOSIS — I130 Hypertensive heart and chronic kidney disease with heart failure and stage 1 through stage 4 chronic kidney disease, or unspecified chronic kidney disease: Secondary | ICD-10-CM | POA: Diagnosis not present

## 2015-04-14 DIAGNOSIS — N184 Chronic kidney disease, stage 4 (severe): Secondary | ICD-10-CM | POA: Diagnosis not present

## 2015-04-14 DIAGNOSIS — Z7952 Long term (current) use of systemic steroids: Secondary | ICD-10-CM | POA: Diagnosis not present

## 2015-04-14 DIAGNOSIS — E43 Unspecified severe protein-calorie malnutrition: Secondary | ICD-10-CM | POA: Diagnosis not present

## 2015-04-16 DIAGNOSIS — N184 Chronic kidney disease, stage 4 (severe): Secondary | ICD-10-CM | POA: Diagnosis not present

## 2015-04-16 DIAGNOSIS — Z5181 Encounter for therapeutic drug level monitoring: Secondary | ICD-10-CM | POA: Diagnosis not present

## 2015-04-16 DIAGNOSIS — I82403 Acute embolism and thrombosis of unspecified deep veins of lower extremity, bilateral: Secondary | ICD-10-CM | POA: Diagnosis not present

## 2015-04-16 DIAGNOSIS — Z7952 Long term (current) use of systemic steroids: Secondary | ICD-10-CM | POA: Diagnosis not present

## 2015-04-16 DIAGNOSIS — E039 Hypothyroidism, unspecified: Secondary | ICD-10-CM | POA: Diagnosis not present

## 2015-04-16 DIAGNOSIS — E43 Unspecified severe protein-calorie malnutrition: Secondary | ICD-10-CM | POA: Diagnosis not present

## 2015-04-16 DIAGNOSIS — I130 Hypertensive heart and chronic kidney disease with heart failure and stage 1 through stage 4 chronic kidney disease, or unspecified chronic kidney disease: Secondary | ICD-10-CM | POA: Diagnosis not present

## 2015-04-16 DIAGNOSIS — I5030 Unspecified diastolic (congestive) heart failure: Secondary | ICD-10-CM | POA: Diagnosis not present

## 2015-04-16 DIAGNOSIS — J441 Chronic obstructive pulmonary disease with (acute) exacerbation: Secondary | ICD-10-CM | POA: Diagnosis not present

## 2015-04-16 DIAGNOSIS — Z7901 Long term (current) use of anticoagulants: Secondary | ICD-10-CM | POA: Diagnosis not present

## 2015-04-17 DIAGNOSIS — Z5181 Encounter for therapeutic drug level monitoring: Secondary | ICD-10-CM | POA: Diagnosis not present

## 2015-04-17 DIAGNOSIS — I130 Hypertensive heart and chronic kidney disease with heart failure and stage 1 through stage 4 chronic kidney disease, or unspecified chronic kidney disease: Secondary | ICD-10-CM | POA: Diagnosis not present

## 2015-04-17 DIAGNOSIS — I82403 Acute embolism and thrombosis of unspecified deep veins of lower extremity, bilateral: Secondary | ICD-10-CM | POA: Diagnosis not present

## 2015-04-17 DIAGNOSIS — I5030 Unspecified diastolic (congestive) heart failure: Secondary | ICD-10-CM | POA: Diagnosis not present

## 2015-04-17 DIAGNOSIS — J441 Chronic obstructive pulmonary disease with (acute) exacerbation: Secondary | ICD-10-CM | POA: Diagnosis not present

## 2015-04-17 DIAGNOSIS — Z7952 Long term (current) use of systemic steroids: Secondary | ICD-10-CM | POA: Diagnosis not present

## 2015-04-17 DIAGNOSIS — E039 Hypothyroidism, unspecified: Secondary | ICD-10-CM | POA: Diagnosis not present

## 2015-04-17 DIAGNOSIS — E43 Unspecified severe protein-calorie malnutrition: Secondary | ICD-10-CM | POA: Diagnosis not present

## 2015-04-17 DIAGNOSIS — N184 Chronic kidney disease, stage 4 (severe): Secondary | ICD-10-CM | POA: Diagnosis not present

## 2015-04-17 DIAGNOSIS — Z7901 Long term (current) use of anticoagulants: Secondary | ICD-10-CM | POA: Diagnosis not present

## 2015-04-24 DIAGNOSIS — Z5181 Encounter for therapeutic drug level monitoring: Secondary | ICD-10-CM | POA: Diagnosis not present

## 2015-04-24 DIAGNOSIS — Z7952 Long term (current) use of systemic steroids: Secondary | ICD-10-CM | POA: Diagnosis not present

## 2015-04-24 DIAGNOSIS — E43 Unspecified severe protein-calorie malnutrition: Secondary | ICD-10-CM | POA: Diagnosis not present

## 2015-04-24 DIAGNOSIS — I82403 Acute embolism and thrombosis of unspecified deep veins of lower extremity, bilateral: Secondary | ICD-10-CM | POA: Diagnosis not present

## 2015-04-24 DIAGNOSIS — N184 Chronic kidney disease, stage 4 (severe): Secondary | ICD-10-CM | POA: Diagnosis not present

## 2015-04-24 DIAGNOSIS — Z7901 Long term (current) use of anticoagulants: Secondary | ICD-10-CM | POA: Diagnosis not present

## 2015-04-24 DIAGNOSIS — I5030 Unspecified diastolic (congestive) heart failure: Secondary | ICD-10-CM | POA: Diagnosis not present

## 2015-04-24 DIAGNOSIS — J441 Chronic obstructive pulmonary disease with (acute) exacerbation: Secondary | ICD-10-CM | POA: Diagnosis not present

## 2015-04-24 DIAGNOSIS — I130 Hypertensive heart and chronic kidney disease with heart failure and stage 1 through stage 4 chronic kidney disease, or unspecified chronic kidney disease: Secondary | ICD-10-CM | POA: Diagnosis not present

## 2015-04-24 DIAGNOSIS — E039 Hypothyroidism, unspecified: Secondary | ICD-10-CM | POA: Diagnosis not present

## 2015-04-29 ENCOUNTER — Emergency Department (HOSPITAL_COMMUNITY): Payer: Medicare Other

## 2015-04-29 ENCOUNTER — Encounter (HOSPITAL_COMMUNITY): Payer: Self-pay | Admitting: Emergency Medicine

## 2015-04-29 ENCOUNTER — Emergency Department (HOSPITAL_COMMUNITY)
Admission: EM | Admit: 2015-04-29 | Discharge: 2015-04-29 | Disposition: A | Discharge status: Home / Self Care | Payer: Medicare Other | Attending: Emergency Medicine | Admitting: Emergency Medicine

## 2015-04-29 DIAGNOSIS — Z79899 Other long term (current) drug therapy: Secondary | ICD-10-CM | POA: Insufficient documentation

## 2015-04-29 DIAGNOSIS — Z86711 Personal history of pulmonary embolism: Secondary | ICD-10-CM | POA: Diagnosis not present

## 2015-04-29 DIAGNOSIS — J449 Chronic obstructive pulmonary disease, unspecified: Secondary | ICD-10-CM | POA: Insufficient documentation

## 2015-04-29 DIAGNOSIS — I4891 Unspecified atrial fibrillation: Secondary | ICD-10-CM | POA: Insufficient documentation

## 2015-04-29 DIAGNOSIS — Z7901 Long term (current) use of anticoagulants: Secondary | ICD-10-CM | POA: Insufficient documentation

## 2015-04-29 DIAGNOSIS — N189 Chronic kidney disease, unspecified: Secondary | ICD-10-CM | POA: Insufficient documentation

## 2015-04-29 DIAGNOSIS — Z8701 Personal history of pneumonia (recurrent): Secondary | ICD-10-CM | POA: Insufficient documentation

## 2015-04-29 DIAGNOSIS — F17210 Nicotine dependence, cigarettes, uncomplicated: Secondary | ICD-10-CM | POA: Insufficient documentation

## 2015-04-29 DIAGNOSIS — E039 Hypothyroidism, unspecified: Secondary | ICD-10-CM | POA: Diagnosis not present

## 2015-04-29 DIAGNOSIS — R0602 Shortness of breath: Secondary | ICD-10-CM | POA: Diagnosis not present

## 2015-04-29 DIAGNOSIS — Z86718 Personal history of other venous thrombosis and embolism: Secondary | ICD-10-CM | POA: Diagnosis not present

## 2015-04-29 DIAGNOSIS — I129 Hypertensive chronic kidney disease with stage 1 through stage 4 chronic kidney disease, or unspecified chronic kidney disease: Secondary | ICD-10-CM | POA: Diagnosis not present

## 2015-04-29 LAB — BASIC METABOLIC PANEL
ANION GAP: 9 (ref 5–15)
BUN: 26 mg/dL — AB (ref 6–20)
CHLORIDE: 110 mmol/L (ref 101–111)
CO2: 23 mmol/L (ref 22–32)
Calcium: 10.8 mg/dL — ABNORMAL HIGH (ref 8.9–10.3)
Creatinine, Ser: 2.37 mg/dL — ABNORMAL HIGH (ref 0.44–1.00)
GFR, EST AFRICAN AMERICAN: 21 mL/min — AB (ref >60)
GFR, EST NON AFRICAN AMERICAN: 18 mL/min — AB (ref >60)
Glucose, Bld: 104 mg/dL — ABNORMAL HIGH (ref 65–99)
POTASSIUM: 4 mmol/L (ref 3.5–5.1)
SODIUM: 142 mmol/L (ref 135–145)

## 2015-04-29 LAB — CBC
HEMATOCRIT: 36.5 % (ref 36.0–46.0)
HEMOGLOBIN: 11.6 g/dL — AB (ref 12.0–15.0)
MCH: 33.1 pg (ref 26.0–34.0)
MCHC: 31.8 g/dL (ref 30.0–36.0)
MCV: 104.3 fL — AB (ref 78.0–100.0)
Platelets: 170 10*3/uL (ref 150–400)
RBC: 3.5 MIL/uL — AB (ref 3.87–5.11)
RDW: 14.4 % (ref 11.5–15.5)
WBC: 3.2 10*3/uL — AB (ref 4.0–10.5)

## 2015-04-29 LAB — PROTIME-INR
INR: 2.48 — AB (ref 0.00–1.49)
PROTHROMBIN TIME: 26.5 s — AB (ref 11.6–15.2)

## 2015-04-29 LAB — BRAIN NATRIURETIC PEPTIDE: B Natriuretic Peptide: 975.7 pg/mL — ABNORMAL HIGH (ref 0.0–100.0)

## 2015-04-29 LAB — I-STAT TROPONIN, ED: Troponin i, poc: 0.02 ng/mL (ref 0.00–0.08)

## 2015-04-29 MED ORDER — DILTIAZEM HCL 25 MG/5ML IV SOLN
20.0000 mg | Freq: Once | INTRAVENOUS | Status: AC
  Administered 2015-04-29: 20 mg via INTRAVENOUS
  Filled 2015-04-29: qty 5

## 2015-04-29 MED ORDER — TRAMADOL HCL 50 MG PO TABS
50.0000 mg | ORAL_TABLET | Freq: Once | ORAL | Status: AC
  Administered 2015-04-29: 50 mg via ORAL
  Filled 2015-04-29: qty 1

## 2015-04-29 MED ORDER — DILTIAZEM HCL ER 120 MG PO CP12: 120.0000 mg | ORAL_CAPSULE | Freq: Two times a day (BID) | ORAL | Status: DC

## 2015-04-29 NOTE — ED Provider Notes (Signed)
I have personally seen and examined the patient.  I have discussed the plan of care with the resident.  I have reviewed the documentation on PMH/FH/Soc. History.  I have reviewed the documentation of the resident and agree.   EKG Interpretation  Date/Time:  Tuesday April 29 2015 10:11:57 EDT Ventricular Rate:  119 PR Interval:    QRS Duration: 116 QT Interval:  334 QTC Calculation: 469 R Axis:   178 Text Interpretation:  Atrial fibrillation Left posterior fascicular block Anteroseptal infarct , age undetermined Reconfirmed by Aidden Markovic MD, DANIEL [phone]([id number]) on 04/29/2015 1:19:01 PM       82 yo F With a chief complaint shortness of breath. This been going on for the past few days. Patient was recently in the hospital and diagnosed with a DVT. She started on Coumadin. Has been therapeutic over the last couple of visits. Patient feels that she's having worsening trouble getting around the house. Denies any infectious symptoms. On my exam patient has a irregularly irregular heart rhythm. Patient never had a history of atrial fibrillation before. She is already on Coumadin and is therapeutic. Given IV dilt improvement of rate. Concern for possible PE as she was unable to be imaged with a CT head and had a indeterminate nuclear medicine scan. Patient has an elevated BNP. This is above her normal baseline. Discussed risks and benefits of admission and discharge the patient. She understands that she may have a blood clot in her lung and that this may kill her however she requests discharge at this time. She is able to ambulate without becoming hypoxic. Discussed the case with cardiology. She will follow with them in the office. Started on diltiazem. D/c home.  Melene Planan Teddie Curd, DO 04/29/15 1343

## 2015-04-29 NOTE — Discharge Instructions (Signed)
Call the cardiologist to follow up with them.  Return for any worsening symptoms as we do not know if you have a blood clot in your lungs  Atrial Fibrillation Atrial fibrillation is a type of heartbeat that is irregular or fast (rapid). If you have this condition, your heart keeps quivering in a weird (chaotic) way. This condition can make it so your heart cannot pump blood normally. Having this condition gives a person more risk for stroke, heart failure, and other heart problems. There are different types of atrial fibrillation. Talk with your doctor to learn about the type that you have. HOME CARE  Take over-the-counter and prescription medicines only as told by your doctor.  If your doctor prescribed a blood-thinning medicine, take it exactly as told. Taking too much of it can cause bleeding. If you do not take enough of it, you will not have the protection that you need against stroke and other problems.  Do not use any tobacco products. These include cigarettes, chewing tobacco, and e-cigarettes. If you need help quitting, ask your doctor.  If you have apnea (obstructive sleep apnea), manage it as told by your doctor.  Do not drink alcohol.  Do not drink beverages that have caffeine. These include coffee, soda, and tea.  Maintain a healthy weight. Do not use diet pills unless your doctor says they are safe for you. Diet pills may make heart problems worse.  Follow diet instructions as told by your doctor.  Exercise regularly as told by your doctor.  Keep all follow-up visits as told by your doctor. This is important. GET HELP IF:  You notice a change in the speed, rhythm, or strength of your heartbeat.  You are taking a blood-thinning medicine and you notice more bruising.  You get tired more easily when you move or exercise. GET HELP RIGHT AWAY IF:  You have pain in your chest or your belly (abdomen).  You have sweating or weakness.  You feel sick to your stomach  (nauseous).  You notice blood in your throw up (vomit), poop (stool), or pee (urine).  You are short of breath.  You suddenly have swollen feet and ankles.  You feel dizzy.  Your suddenly get weak or numb in your face, arms, or legs, especially if it happens on one side of your body.  You have trouble talking, trouble understanding, or both.  Your face or your eyelid droops on one side. These symptoms may be an emergency. Do not wait to see if the symptoms will go away. Get medical help right away. Call your local emergency services (911 in the [place] Do not drive yourself to the hospital.   This information is not intended to replace advice given to you by your health care provider. Make sure you discuss any questions you have with your health care provider.   Document Released: 10/07/2007 Document Revised: 09/18/2014 Document Reviewed: 04/24/2014 Elsevier Interactive Patient Education [employer]2016 [employer]

## 2015-04-29 NOTE — ED Notes (Signed)
Pt ambulated in the dept with a walker and this RN and maintained O2 saturation levels between 94-100%.

## 2015-04-29 NOTE — ED Notes (Signed)
Pt sts SOB x 2 days; pt denies cough

## 2015-04-29 NOTE — ED Provider Notes (Signed)
CSN: [id number]     Arrival date & time 04/29/15  1008 History   First MD Initiated Contact with Patient 04/29/15 1031     Chief Complaint  Patient presents with  . Shortness of Breath   HPI Pt is a 82 y.o. female with history of HTN, CKD, COPD and recent dx of second DVT with possible PE. She presents with worsening of chronic dyspnea starting yesterday. The SOB is intermittent and worse when lying flat or with exertion, including talking. She has no associated CP, palpitations or nausea. She does endorse feeling hot and sweaty with the SOB starting yesterday. She denies any fever, cough, mucous production/congestion. She was admitted last month for dyspnea and found to have a recurrent DVT and a V/Q was indeterminate for PE, she did not get a CTA because of her kidney disease. She denies any history of heart disease or abnormal heart rhythm.   Past Medical History  Diagnosis Date  . Thyroid disease   . Hypertension   . Gout   . Pneumonia   . Arthritis    Past Surgical History  Procedure Laterality Date  . Abdominal hysterectomy    . Total knee arthroplasty     Family History  Problem Relation Age of Onset  . Stroke Neg Hx   . CAD Neg Hx    Social History  Substance Use Topics  . Smoking status: Current Every Day Smoker -- 0.25 packs/day for 20 years    Types: Cigarettes  . Smokeless tobacco: Never Used  . Alcohol Use: No   OB History    No data available     Review of Systems See HPI   Allergies  Review of patient's allergies indicates no known allergies.  Home Medications   Prior to Admission medications   Medication Sig Start Date End Date Taking? Authorizing Provider  allopurinol (ZYLOPRIM) 100 MG tablet Take 100 mg by mouth every morning.    Historical Provider, MD  amLODipine (NORVASC) 5 MG tablet Take 5 mg by mouth daily.    Historical Provider, MD  azithromycin (ZITHROMAX) 500 MG tablet Take 1 tablet (500 mg total) by mouth daily. 03/20/15   Osvaldo Shipper,  MD  enoxaparin (LOVENOX) 40 MG/0.4ML injection Inject 0.4 mLs (40 mg total) into the skin daily. 03/20/15   Osvaldo Shipper, MD  feeding supplement, ENSURE COMPLETE, (ENSURE COMPLETE) LIQD Take 237 mLs by mouth 2 (two) times daily between meals. 12/18/13   Nishant Dhungel, MD  methimazole (TAPAZOLE) 5 MG tablet Take 5 mg by mouth every morning.     Historical Provider, MD  omeprazole (PRILOSEC) 40 MG capsule Take 1 capsule by mouth 2 (two) times daily. 11/04/12   Historical Provider, MD  polyethylene glycol (MIRALAX / GLYCOLAX) packet Take 17 g by mouth daily. 03/20/15   Osvaldo Shipper, MD  predniSONE (DELTASONE) 20 MG tablet Take 3 tablets once daily for 3 days, then take 2 tablets once daily for 3 days, then take 1 tablets once daily for 3 days, then STOP. 03/20/15   Osvaldo Shipper, MD  senna (SENOKOT) 8.6 MG TABS tablet Take 1 tablet (8.6 mg total) by mouth daily. 12/18/13   Nishant Dhungel, MD  sodium bicarbonate 650 MG tablet Take 1 tablet (650 mg total) by mouth 3 (three) times daily. 03/20/15   Osvaldo Shipper, MD  traMADol (ULTRAM) 50 MG tablet Take 50-100 mg by mouth every 6 (six) hours as needed for moderate pain.  10/31/12   Historical Provider, MD  warfarin (COUMADIN)  2 MG tablet Take 1 tablet (2 mg total) by mouth daily at 6 PM. 03/20/15   Osvaldo Shipper, MD   BP 138/90 mmHg  Pulse 98  Temp(Src) 97.4 F (36.3 C) (Oral)  Resp 16  SpO2 98% Physical Exam  Constitutional: She is oriented to person, place, and time. She appears well-developed and well-nourished. No distress.  HENT:  Head: Normocephalic and atraumatic.  Right Ear: External ear normal.  Left Ear: External ear normal.  Nose: Nose normal.  Mouth/Throat: Oropharynx is clear and moist.  Eyes: Conjunctivae are normal. Pupils are equal, round, and reactive to light. Right eye exhibits no discharge. Left eye exhibits no discharge. No scleral icterus.  Neck: Normal range of motion. Neck supple. No JVD present.  Cardiovascular: S1 normal,  S2 normal, normal heart sounds and intact distal pulses.  An irregularly irregular rhythm present. Tachycardia present.   No murmur heard. Pulmonary/Chest: Effort normal and breath sounds normal. No respiratory distress. She has no wheezes.  Abdominal: Soft. Bowel sounds are normal. She exhibits no distension. There is no tenderness.  Neurological: She is alert and oriented to person, place, and time.  Skin: Skin is warm and dry. No rash noted. She is not diaphoretic. No pallor.  Psychiatric: She has a normal mood and affect. Her behavior is normal.  Nursing note and vitals reviewed.   ED Course  Procedures (including critical care time) Labs Review Labs Reviewed  BASIC METABOLIC PANEL - Abnormal; Notable for the following:    Glucose, Bld 104 (*)    BUN 26 (*)    Creatinine, Ser 2.37 (*)    Calcium 10.8 (*)    GFR calc non Af Amer 18 (*)    GFR calc Af Amer 21 (*)    All other components within normal limits  CBC - Abnormal; Notable for the following:    WBC 3.2 (*)    RBC 3.50 (*)    Hemoglobin 11.6 (*)    MCV 104.3 (*)    All other components within normal limits  PROTIME-INR - Abnormal; Notable for the following:    Prothrombin Time 26.5 (*)    INR 2.48 (*)    All other components within normal limits  BRAIN NATRIURETIC PEPTIDE  I-STAT TROPOININ, ED    Imaging Review Dg Chest 2 View  04/29/2015  CLINICAL DATA:  Shortness of breath with exertion.  Hypertension EXAM: CHEST  2 VIEW COMPARISON:  March 18, 2015 chest radiograph and chest CT April 30, 2013 FINDINGS: There is a focal area of somewhat ill-defined opacity in the right upper lobe, not present on prior study. This area is suspicious for a focal area of pneumonia. Lungs elsewhere are clear. Heart is mildly enlarged with pulmonary vascularity within normal limits. There is extensive atherosclerotic calcification in the aorta. There is degenerative change in the thoracic spine. IMPRESSION: Ill-defined opacity right upper  lobe, suspicious for a small focus of pneumonia. Lungs elsewhere clear. Stable cardiac prominence. Extensive atherosclerotic calcification in the aorta. Followup PA and lateral chest radiographs recommended in 3-4 weeks following trial of antibiotic therapy to ensure resolution and exclude underlying malignancy. Electronically Signed   By: Bretta Bang III M.D.   On: 04/29/2015 11:17   I have personally reviewed and evaluated these images and lab results as part of my medical decision-making.   EKG Interpretation   Date/Time:  Tuesday April 29 2015 10:11:57 EDT Ventricular Rate:  119 PR Interval:    QRS Duration: 116 QT Interval:  334  QTC Calculation: 469 R Axis:   178 Text Interpretation:  tachycardia difficult to identify p waves Left  posterior fascicular block Anteroseptal infarct , age undetermined  Confirmed by FLOYD MD, DANIEL ([id number]) on 04/29/2015 10:42:55 AM      MDM   Final diagnoses:  None   Dyspnea likely secondary to new onset atrial fibrilation. Also with small opacity on CXR concerning for pneumonia vs atelectasis. Will check ambulatory pulse ox.   No desaturation with activity. Rate and dyspnea improved on diltiazem. Will discharge home with diltiazem and f/u with cards next week.  Abram SanderElena M Adamo, MD 04/29/15 1341  Melene Planan Floyd, DO 04/29/15 1438

## 2015-04-30 DIAGNOSIS — I130 Hypertensive heart and chronic kidney disease with heart failure and stage 1 through stage 4 chronic kidney disease, or unspecified chronic kidney disease: Secondary | ICD-10-CM | POA: Diagnosis not present

## 2015-04-30 DIAGNOSIS — E43 Unspecified severe protein-calorie malnutrition: Secondary | ICD-10-CM | POA: Diagnosis not present

## 2015-04-30 DIAGNOSIS — I5030 Unspecified diastolic (congestive) heart failure: Secondary | ICD-10-CM | POA: Diagnosis not present

## 2015-04-30 DIAGNOSIS — Z5181 Encounter for therapeutic drug level monitoring: Secondary | ICD-10-CM | POA: Diagnosis not present

## 2015-04-30 DIAGNOSIS — E039 Hypothyroidism, unspecified: Secondary | ICD-10-CM | POA: Diagnosis not present

## 2015-04-30 DIAGNOSIS — Z7952 Long term (current) use of systemic steroids: Secondary | ICD-10-CM | POA: Diagnosis not present

## 2015-04-30 DIAGNOSIS — J441 Chronic obstructive pulmonary disease with (acute) exacerbation: Secondary | ICD-10-CM | POA: Diagnosis not present

## 2015-04-30 DIAGNOSIS — N184 Chronic kidney disease, stage 4 (severe): Secondary | ICD-10-CM | POA: Diagnosis not present

## 2015-04-30 DIAGNOSIS — I82403 Acute embolism and thrombosis of unspecified deep veins of lower extremity, bilateral: Secondary | ICD-10-CM | POA: Diagnosis not present

## 2015-04-30 DIAGNOSIS — Z7901 Long term (current) use of anticoagulants: Secondary | ICD-10-CM | POA: Diagnosis not present

## 2015-05-01 DIAGNOSIS — E049 Nontoxic goiter, unspecified: Secondary | ICD-10-CM | POA: Diagnosis not present

## 2015-05-01 DIAGNOSIS — I10 Essential (primary) hypertension: Secondary | ICD-10-CM | POA: Diagnosis not present

## 2015-05-01 DIAGNOSIS — I825Y9 Chronic embolism and thrombosis of unspecified deep veins of unspecified proximal lower extremity: Secondary | ICD-10-CM | POA: Diagnosis not present

## 2015-05-01 DIAGNOSIS — J449 Chronic obstructive pulmonary disease, unspecified: Secondary | ICD-10-CM | POA: Diagnosis not present

## 2015-05-08 DIAGNOSIS — Z7952 Long term (current) use of systemic steroids: Secondary | ICD-10-CM | POA: Diagnosis not present

## 2015-05-08 DIAGNOSIS — J441 Chronic obstructive pulmonary disease with (acute) exacerbation: Secondary | ICD-10-CM | POA: Diagnosis not present

## 2015-05-08 DIAGNOSIS — I130 Hypertensive heart and chronic kidney disease with heart failure and stage 1 through stage 4 chronic kidney disease, or unspecified chronic kidney disease: Secondary | ICD-10-CM | POA: Diagnosis not present

## 2015-05-08 DIAGNOSIS — I82403 Acute embolism and thrombosis of unspecified deep veins of lower extremity, bilateral: Secondary | ICD-10-CM | POA: Diagnosis not present

## 2015-05-08 DIAGNOSIS — E039 Hypothyroidism, unspecified: Secondary | ICD-10-CM | POA: Diagnosis not present

## 2015-05-08 DIAGNOSIS — Z5181 Encounter for therapeutic drug level monitoring: Secondary | ICD-10-CM | POA: Diagnosis not present

## 2015-05-08 DIAGNOSIS — I5030 Unspecified diastolic (congestive) heart failure: Secondary | ICD-10-CM | POA: Diagnosis not present

## 2015-05-08 DIAGNOSIS — N184 Chronic kidney disease, stage 4 (severe): Secondary | ICD-10-CM | POA: Diagnosis not present

## 2015-05-08 DIAGNOSIS — Z7901 Long term (current) use of anticoagulants: Secondary | ICD-10-CM | POA: Diagnosis not present

## 2015-05-08 DIAGNOSIS — E43 Unspecified severe protein-calorie malnutrition: Secondary | ICD-10-CM | POA: Diagnosis not present

## 2015-05-12 DIAGNOSIS — I5030 Unspecified diastolic (congestive) heart failure: Secondary | ICD-10-CM | POA: Diagnosis not present

## 2015-05-12 DIAGNOSIS — J441 Chronic obstructive pulmonary disease with (acute) exacerbation: Secondary | ICD-10-CM | POA: Diagnosis not present

## 2015-05-12 DIAGNOSIS — N184 Chronic kidney disease, stage 4 (severe): Secondary | ICD-10-CM | POA: Diagnosis not present

## 2015-05-12 DIAGNOSIS — Z5181 Encounter for therapeutic drug level monitoring: Secondary | ICD-10-CM | POA: Diagnosis not present

## 2015-05-12 DIAGNOSIS — Z7901 Long term (current) use of anticoagulants: Secondary | ICD-10-CM | POA: Diagnosis not present

## 2015-05-12 DIAGNOSIS — E43 Unspecified severe protein-calorie malnutrition: Secondary | ICD-10-CM | POA: Diagnosis not present

## 2015-05-12 DIAGNOSIS — I130 Hypertensive heart and chronic kidney disease with heart failure and stage 1 through stage 4 chronic kidney disease, or unspecified chronic kidney disease: Secondary | ICD-10-CM | POA: Diagnosis not present

## 2015-05-12 DIAGNOSIS — E039 Hypothyroidism, unspecified: Secondary | ICD-10-CM | POA: Diagnosis not present

## 2015-05-12 DIAGNOSIS — I82403 Acute embolism and thrombosis of unspecified deep veins of lower extremity, bilateral: Secondary | ICD-10-CM | POA: Diagnosis not present

## 2015-05-12 DIAGNOSIS — Z7952 Long term (current) use of systemic steroids: Secondary | ICD-10-CM | POA: Diagnosis not present

## 2015-05-16 ENCOUNTER — Ambulatory Visit (INDEPENDENT_AMBULATORY_CARE_PROVIDER_SITE_OTHER): Payer: Medicare Other | Admitting: Cardiovascular Disease

## 2015-05-16 ENCOUNTER — Encounter: Payer: Self-pay | Admitting: Cardiovascular Disease

## 2015-05-16 VITALS — BP 120/72 | HR 70 | Ht 62.0 in | Wt 102.5 lb

## 2015-05-16 DIAGNOSIS — R0602 Shortness of breath: Secondary | ICD-10-CM

## 2015-05-16 DIAGNOSIS — E43 Unspecified severe protein-calorie malnutrition: Secondary | ICD-10-CM

## 2015-05-16 DIAGNOSIS — J449 Chronic obstructive pulmonary disease, unspecified: Secondary | ICD-10-CM

## 2015-05-16 DIAGNOSIS — I4891 Unspecified atrial fibrillation: Secondary | ICD-10-CM | POA: Diagnosis not present

## 2015-05-16 DIAGNOSIS — I10 Essential (primary) hypertension: Secondary | ICD-10-CM | POA: Diagnosis not present

## 2015-05-16 DIAGNOSIS — I5042 Chronic combined systolic (congestive) and diastolic (congestive) heart failure: Secondary | ICD-10-CM

## 2015-05-16 DIAGNOSIS — N184 Chronic kidney disease, stage 4 (severe): Secondary | ICD-10-CM

## 2015-05-16 MED ORDER — ALBUTEROL SULFATE HFA 108 (90 BASE) MCG/ACT IN AERS: 2.0000 | INHALATION_SPRAY | Freq: Four times a day (QID) | RESPIRATORY_TRACT | Status: DC | PRN

## 2015-05-16 NOTE — Patient Instructions (Addendum)
Medication Instructions: Dr Royann Shiversroitoru has recommended making the following medication changes: 1. START Albuterol - Inhale 1-2 puffs every 4-6 hours as needed 2. USE SYMBICORT NO MORE THAN TWICE DAILY. THIS IS NOT YOUR RESCUE INHALER.  Labwork: NONE  Testing/Procedures: 1. Echocardiogram - Your physician has requested that you have an echocardiogram. Echocardiography is a painless test that uses sound waves to create images of your heart. It provides your doctor with information about the size and shape of your heart and how well your heart's chambers and valves are working. This procedure takes approximately one hour. There are no restrictions for this procedure. [facility] - the address is [address]  Follow-up: Dr Royann Shiversroitoru recommends that you schedule a follow-up appointment after the echocardiogram.  If you need a refill on your cardiac medications before your next appointment, please call your pharmacy.

## 2015-05-16 NOTE — Progress Notes (Signed)
Patient ID: [name] Alexander, female   DOB: 1934-01-08, 82 y.o.   MRN: [id number]    Cardiology Office Note    Date:  05/17/2015   ID:  [name] Alexander, DOB 1934-01-08, MRN [id number]  PCP:  Laurena SlimmerLARK,[name], MD  Cardiologist:   Thurmon FairROITORU,Karalyn Kadel, MD   Chief Complaint  Patient presents with  . New Evaluation    no chest pain, occassional shortness of breath, has edema in legs, has pain in legs, no lightheaded or dizziness    History of Present Illness:  [name] Alexander is a 82 y.o. female with newly diagnosed atrial fibrillation.  She has a long-standing history of heavy smoking, she was recently hospitalized in March 2017 for worsening shortness of breath, felt to be primarily a manifestation of COPD exacerbation. She also has a history of borderline left ventricular systolic dysfunction (EF 50%, diastolic abnormalities, hypertension, COPD, advanced chronic kidney disease probably stage IV.   ECG performed March 7 in the hospital shows sinus rhythm with an intraventricular conduction delay. ECG performed in the office on April 18 shows atrial fibrillation with controlled ventricular rate and left posterior fascicular block. ECG today shows she is still in atrial fibrillation. All traces show fairly striking T-wave inversion in leads V4-V6, without much in the way of dynamic changes.  She has a previous history of deep venous thrombosis in 2015 and during her recent hospitalization her lower extremity venous Doppler also showed findings consistent with a mixed acute and chronic DVTs in the right popliteal vein, left common femoral vein, left superficial femoral vein and left popliteal vein. She is currently on warfarin anticoagulation. Prothrombin time is monitored by labs drawn by home physical therapy and adjusted in Dr. Laurena SlimmerPreston Clark's office. I do not have her prothrombin time readings, but she tells me that she has not been consistently therapeutic and her warfarin dose has needed adjustment.  An  echocardiogram performed with the 2015 hospitalization showed not only borderline LVEF (50%) but also "inferior and inferolateral hypokinesis". She does not have angina pectoris and I don't think she's ever had either cardiac catheterization or nuclear myocardial scintigraphy. The echocardiogram was not repeated doing her most recent hospitalization. A CT of the abdomen performed at 2008 showed atherosclerotic calcifications in the aorta, great vessels and mesenteric arteries. The images encompass her entire chest and on my review there is also substantial calcification in the coronary arteries.  She describes NYHA functional class II-III shortness of breath. He even when she "walks too far" inside her home she becomes dyspneic. She does not have dyspnea usually when she gets stressed in the morning. She has bilateral leg edema that is symmetrical. She denies angina pectoris currently or in the past. She frequently has nocturia. She denies recent fever and chills and her cough has improved. Chest frequent dyspnea and wheezing and has been using her Symbicort as a rescue inhaler, several times a day. This medication is not included on her list, and I'm not certain what the dose is. She is unaware of palpitations.  Past Medical History  Diagnosis Date  . Thyroid disease   . Hypertension   . Gout   . Pneumonia   . Arthritis     Past Surgical History  Procedure Laterality Date  . Abdominal hysterectomy    . Total knee arthroplasty      Current Medications: Outpatient Prescriptions Prior to Visit  Medication Sig Dispense Refill  . allopurinol (ZYLOPRIM) 100 MG tablet Take 100 mg by mouth every  morning.    . diltiazem (CARDIZEM SR) 120 MG 12 hr capsule Take 1 capsule (120 mg total) by mouth 2 (two) times daily. 60 capsule 0  . feeding supplement, ENSURE COMPLETE, (ENSURE COMPLETE) LIQD Take 237 mLs by mouth 2 (two) times daily between meals. 60 Bottle 0  . methimazole (TAPAZOLE) 5 MG tablet Take 5  mg by mouth every morning.     Marland Kitchen omeprazole (PRILOSEC) 40 MG capsule Take 1 capsule by mouth 2 (two) times daily.    . polyethylene glycol (MIRALAX / GLYCOLAX) packet Take 17 g by mouth daily. 14 each 0  . senna (SENOKOT) 8.6 MG TABS tablet Take 1 tablet (8.6 mg total) by mouth daily. 30 each 0  . sodium bicarbonate 650 MG tablet Take 1 tablet (650 mg total) by mouth 3 (three) times daily. 90 tablet 0  . traMADol (ULTRAM) 50 MG tablet Take 50-100 mg by mouth every 6 (six) hours as needed for moderate pain.     Marland Kitchen warfarin (COUMADIN) 2 MG tablet Take 1 tablet (2 mg total) by mouth daily at 6 PM. 30 tablet 1  . amLODipine (NORVASC) 5 MG tablet Take 5 mg by mouth daily. Reported on 05/16/2015    . azithromycin (ZITHROMAX) 500 MG tablet Take 1 tablet (500 mg total) by mouth daily. (Patient not taking: Reported on 05/16/2015) 3 tablet 0  . enoxaparin (LOVENOX) 40 MG/0.4ML injection Inject 0.4 mLs (40 mg total) into the skin daily. (Patient not taking: Reported on 05/16/2015) 5 Syringe 0  . predniSONE (DELTASONE) 20 MG tablet Take 3 tablets once daily for 3 days, then take 2 tablets once daily for 3 days, then take 1 tablets once daily for 3 days, then STOP. (Patient not taking: Reported on 05/16/2015) 18 tablet 0   No facility-administered medications prior to visit.     Allergies:   Review of patient's allergies indicates no known allergies.   Social History   Social History  . Marital Status: Widowed    Spouse Name: N/A  . Number of Children: N/A  . Years of Education: N/A   Social History Main Topics  . Smoking status: Current Every Day Smoker -- 0.25 packs/day for 20 years    Types: Cigarettes  . Smokeless tobacco: Never Used  . Alcohol Use: No  . Drug Use: No  . Sexual Activity: Not Asked   Other Topics Concern  . None   Social History Narrative     Family History:  The patient reports that he does not think there is any family history of coronary disease or stroke or arrhythmia. She  has lost 2 children, neither one of them to heart disease..  ROS:   Please see the history of present illness.    ROS All other systems reviewed and are negative.   PHYSICAL EXAM:   VS:  BP 120/72 mmHg  Pulse 70  Ht 5\' 2"  (1.575 m)  Wt 46.494 kg (102 lb 8 oz)  BMI 18.74 kg/m2   GEN: Well nourished, well developed, in no acute distress HEENT: normal Neck: no JVD, carotid bruits, or masses Cardiac: irregular; no murmurs, rubs, or gallops,no edema  Respiratory:  Severely diminished breath sounds bilaterally, but otherwise clear to auscultation bilaterally, normal work of breathing GI: soft, nontender, nondistended, + BS MS: no deformity or atrophy Skin: warm and dry, no rash Neuro:  Alert and Oriented x 3, Strength and sensation are intact Psych: euthymic mood, full affect  Wt Readings from Last 3 Encounters:  05/16/15 46.494 kg (102 lb 8 oz)  03/18/15 40 kg (88 lb 2.9 oz)  12/18/13 37.603 kg (82 lb 14.4 oz)      Studies/Labs Reviewed:   EKG:  EKG is ordered today.  The ekg ordered today demonstrates Atrial fibrillation, QS pattern in leads V1-V3, questionable LVH, ST depression and T-wave inversion in leads V4-V6, less prominently in the inferior leads. There is a minor IVCD with a QRS of 104 ms. The QTC measures 436 ms  Recent Labs: 03/18/2015: TSH 0.871 03/20/2015: ALT 18 04/29/2015: B Natriuretic Peptide 975.7*; BUN 26*; Creatinine, Ser 2.37*; Hemoglobin 11.6*; Platelets 170; Potassium 4.0; Sodium 142   Lipid Panel No results found for: CHOL, TRIG, HDL, CHOLHDL, VLDL, LDLCALC, LDLDIRECT  Additional studies/ records that were reviewed today include:  Records of her hospitalization, previous echocardiography and radiology studies,    ASSESSMENT:    1. Shortness of breath   2. Atrial fibrillation, unspecified type (HCC)   3. Chronic combined systolic and diastolic CHF (congestive heart failure) (HCC)   4. Chronic obstructive pulmonary disease, unspecified COPD type (HCC)    5. Essential hypertension   6. CKD (chronic kidney disease) stage 4, GFR 15-29 ml/min (HCC)   7. Protein-calorie malnutrition, severe      PLAN:  In order of problems listed above:  1. Dyspnea is likely multifactorial, with a predominant pulmonary cause for moderate or severe long-standing smoking related emphysema. However, she has evidence of both mild systolic dysfunction and diastolic dysfunction by echo performed in 2015. This should be repeated. Her chest x-ray did not show pulmonary edema. Her BNP was quite high at almost the thousand. 2. Atrial fibrillation: It is not clear to what degree this is currently contributing to her shortness of breath. We did talk about elective cardioversion. This would be a "one-time fix" and atrial fibrillation is likely to recur at her age and with the severity of her lung problems (probably also heart disease). There really is not a good antiarrhythmic that is well suited for her, since she has advanced chronic kidney disease. Amiodarone would probably be the only reasonable choice, with the attendant risk of pulmonary toxicity. We'll need to get the records of her anticoagulation monitoring from Dr. Chestine Spore. Mrs. Hufnagle had an immediate response today that she has no interest in undergoing shocks to her heart. She might be right. 3. CHF: Need to reevaluate left ventricular systolic function, regional wall motion. Unfortunately, while in atrial fibrillation the Doppler parameters will not be very reliable to assess for the presence of elevated filling pressures. Her BNP was markedly elevated while in the hospital suggesting a component of heart failure, but her "baseline" BMP is uncertain. Furthermore, it is hard to say whether her leg edema and the elevated jugular venous pulsations seen today are related to left heart failure or simply cor pulmonale due to COPD, not to mention postphlebitic syndrome in recent acute DVT. Her weight is substantially higher than it  was recorded in her hospital records, but I doubt the accuracy of that value. 4. COPD: She has decided to quit smoking. I think this is the most important contributor to her dyspnea. I have told her that it is dangerous to use long-acting bronchodilator such as Symbicort as rescue inhalers. They should only be used in the prescribed dose, not more frequently than twice daily. I gave her prescription for albuterol to use as needed. 5. HTN: Blood pressure is very well controlled on current dose of diltiazem, which also serves  for atrial fibrillation rate control 6. CKD stage IV: She is not a great candidate for invasive coronary angiography due to the risk of renal failure. Will review the echo first. Reluctant to recommend any iodine contrast is procedures. 7. She is underweight and has recently experienced a lot of fluctuation in her weight.    Medication Adjustments/Labs and Tests Ordered: Current medicines are reviewed at length with the patient today.  Concerns regarding medicines are outlined above.  Medication changes, Labs and Tests ordered today are listed in the Patient Instructions below. Patient Instructions  Medication Instructions: Dr Royann Shivers has recommended making the following medication changes: 1. START Albuterol - Inhale 1-2 puffs every 4-6 hours as needed 2. USE SYMBICORT NO MORE THAN TWICE DAILY. THIS IS NOT YOUR RESCUE INHALER.  Labwork: NONE  Testing/Procedures: 1. Echocardiogram - Your physician has requested that you have an echocardiogram. Echocardiography is a painless test that uses sound waves to create images of your heart. It provides your doctor with information about the size and shape of your heart and how well your heart's chambers and valves are working. This procedure takes approximately one hour. There are no restrictions for this procedure. This will be done at our [facility] office - the address is [address]  Follow-up: Dr Royann Shivers recommends that  you schedule a follow-up appointment after the echocardiogram.  If you need a refill on your cardiac medications before your next appointment, please call your pharmacy.    Joie Bimler, MD  05/17/2015 9:25 PM    [facility] [address] [place], [place], [place]  [address] Phone: [phone]; Fax: [phone]

## 2015-05-17 DIAGNOSIS — I5042 Chronic combined systolic (congestive) and diastolic (congestive) heart failure: Secondary | ICD-10-CM | POA: Insufficient documentation

## 2015-05-17 DIAGNOSIS — J449 Chronic obstructive pulmonary disease, unspecified: Secondary | ICD-10-CM | POA: Insufficient documentation

## 2015-05-19 ENCOUNTER — Telehealth: Payer: Self-pay

## 2015-05-19 NOTE — Telephone Encounter (Signed)
-----   Message from Thurmon FairMihai Croitoru, MD sent at 05/17/2015  9:49 PM EDT ----- Please get her records for warfarin anticoagulation from Dr. Chestine Sporelark, before her next appointment

## 2015-05-19 NOTE — Telephone Encounter (Signed)
Called for records.

## 2015-05-20 DIAGNOSIS — I82403 Acute embolism and thrombosis of unspecified deep veins of lower extremity, bilateral: Secondary | ICD-10-CM | POA: Diagnosis not present

## 2015-05-20 DIAGNOSIS — E43 Unspecified severe protein-calorie malnutrition: Secondary | ICD-10-CM | POA: Diagnosis not present

## 2015-05-20 DIAGNOSIS — Z7901 Long term (current) use of anticoagulants: Secondary | ICD-10-CM | POA: Diagnosis not present

## 2015-05-20 DIAGNOSIS — I130 Hypertensive heart and chronic kidney disease with heart failure and stage 1 through stage 4 chronic kidney disease, or unspecified chronic kidney disease: Secondary | ICD-10-CM | POA: Diagnosis not present

## 2015-05-20 DIAGNOSIS — Z5181 Encounter for therapeutic drug level monitoring: Secondary | ICD-10-CM | POA: Diagnosis not present

## 2015-05-20 DIAGNOSIS — E039 Hypothyroidism, unspecified: Secondary | ICD-10-CM | POA: Diagnosis not present

## 2015-05-20 DIAGNOSIS — J441 Chronic obstructive pulmonary disease with (acute) exacerbation: Secondary | ICD-10-CM | POA: Diagnosis not present

## 2015-05-20 DIAGNOSIS — Z7952 Long term (current) use of systemic steroids: Secondary | ICD-10-CM | POA: Diagnosis not present

## 2015-05-20 DIAGNOSIS — N184 Chronic kidney disease, stage 4 (severe): Secondary | ICD-10-CM | POA: Diagnosis not present

## 2015-05-20 DIAGNOSIS — I5030 Unspecified diastolic (congestive) heart failure: Secondary | ICD-10-CM | POA: Diagnosis not present

## 2015-05-23 DIAGNOSIS — M25552 Pain in left hip: Secondary | ICD-10-CM | POA: Diagnosis not present

## 2015-05-23 DIAGNOSIS — M25562 Pain in left knee: Secondary | ICD-10-CM | POA: Diagnosis not present

## 2015-05-26 DIAGNOSIS — N184 Chronic kidney disease, stage 4 (severe): Secondary | ICD-10-CM | POA: Diagnosis not present

## 2015-05-26 DIAGNOSIS — Z7952 Long term (current) use of systemic steroids: Secondary | ICD-10-CM | POA: Diagnosis not present

## 2015-05-26 DIAGNOSIS — E43 Unspecified severe protein-calorie malnutrition: Secondary | ICD-10-CM | POA: Diagnosis not present

## 2015-05-26 DIAGNOSIS — I82403 Acute embolism and thrombosis of unspecified deep veins of lower extremity, bilateral: Secondary | ICD-10-CM | POA: Diagnosis not present

## 2015-05-26 DIAGNOSIS — Z7901 Long term (current) use of anticoagulants: Secondary | ICD-10-CM | POA: Diagnosis not present

## 2015-05-26 DIAGNOSIS — E039 Hypothyroidism, unspecified: Secondary | ICD-10-CM | POA: Diagnosis not present

## 2015-05-26 DIAGNOSIS — I5030 Unspecified diastolic (congestive) heart failure: Secondary | ICD-10-CM | POA: Diagnosis not present

## 2015-05-26 DIAGNOSIS — F17210 Nicotine dependence, cigarettes, uncomplicated: Secondary | ICD-10-CM | POA: Diagnosis not present

## 2015-05-26 DIAGNOSIS — Z5181 Encounter for therapeutic drug level monitoring: Secondary | ICD-10-CM | POA: Diagnosis not present

## 2015-05-26 DIAGNOSIS — I130 Hypertensive heart and chronic kidney disease with heart failure and stage 1 through stage 4 chronic kidney disease, or unspecified chronic kidney disease: Secondary | ICD-10-CM | POA: Diagnosis not present

## 2015-05-26 DIAGNOSIS — J441 Chronic obstructive pulmonary disease with (acute) exacerbation: Secondary | ICD-10-CM | POA: Diagnosis not present

## 2015-06-02 ENCOUNTER — Other Ambulatory Visit: Payer: Self-pay | Admitting: *Deleted

## 2015-06-02 ENCOUNTER — Ambulatory Visit (HOSPITAL_COMMUNITY): Payer: Medicare Other

## 2015-06-02 ENCOUNTER — Other Ambulatory Visit: Payer: Self-pay

## 2015-06-02 DIAGNOSIS — E43 Unspecified severe protein-calorie malnutrition: Secondary | ICD-10-CM | POA: Diagnosis not present

## 2015-06-02 DIAGNOSIS — I82403 Acute embolism and thrombosis of unspecified deep veins of lower extremity, bilateral: Secondary | ICD-10-CM | POA: Diagnosis not present

## 2015-06-02 DIAGNOSIS — Z7952 Long term (current) use of systemic steroids: Secondary | ICD-10-CM | POA: Diagnosis not present

## 2015-06-02 DIAGNOSIS — I5030 Unspecified diastolic (congestive) heart failure: Secondary | ICD-10-CM | POA: Diagnosis not present

## 2015-06-02 DIAGNOSIS — E039 Hypothyroidism, unspecified: Secondary | ICD-10-CM | POA: Diagnosis not present

## 2015-06-02 DIAGNOSIS — N184 Chronic kidney disease, stage 4 (severe): Secondary | ICD-10-CM | POA: Diagnosis not present

## 2015-06-02 DIAGNOSIS — Z7901 Long term (current) use of anticoagulants: Secondary | ICD-10-CM | POA: Diagnosis not present

## 2015-06-02 DIAGNOSIS — F17210 Nicotine dependence, cigarettes, uncomplicated: Secondary | ICD-10-CM | POA: Diagnosis not present

## 2015-06-02 DIAGNOSIS — I130 Hypertensive heart and chronic kidney disease with heart failure and stage 1 through stage 4 chronic kidney disease, or unspecified chronic kidney disease: Secondary | ICD-10-CM | POA: Diagnosis not present

## 2015-06-02 DIAGNOSIS — J441 Chronic obstructive pulmonary disease with (acute) exacerbation: Secondary | ICD-10-CM | POA: Diagnosis not present

## 2015-06-02 DIAGNOSIS — Z5181 Encounter for therapeutic drug level monitoring: Secondary | ICD-10-CM | POA: Diagnosis not present

## 2015-06-02 MED ORDER — DILTIAZEM HCL ER 120 MG PO CP12: 120.0000 mg | ORAL_CAPSULE | Freq: Two times a day (BID) | ORAL | Status: DC

## 2015-06-10 DIAGNOSIS — F17210 Nicotine dependence, cigarettes, uncomplicated: Secondary | ICD-10-CM | POA: Diagnosis not present

## 2015-06-10 DIAGNOSIS — I130 Hypertensive heart and chronic kidney disease with heart failure and stage 1 through stage 4 chronic kidney disease, or unspecified chronic kidney disease: Secondary | ICD-10-CM | POA: Diagnosis not present

## 2015-06-10 DIAGNOSIS — I5030 Unspecified diastolic (congestive) heart failure: Secondary | ICD-10-CM | POA: Diagnosis not present

## 2015-06-10 DIAGNOSIS — Z7952 Long term (current) use of systemic steroids: Secondary | ICD-10-CM | POA: Diagnosis not present

## 2015-06-10 DIAGNOSIS — Z5181 Encounter for therapeutic drug level monitoring: Secondary | ICD-10-CM | POA: Diagnosis not present

## 2015-06-10 DIAGNOSIS — N184 Chronic kidney disease, stage 4 (severe): Secondary | ICD-10-CM | POA: Diagnosis not present

## 2015-06-10 DIAGNOSIS — Z7901 Long term (current) use of anticoagulants: Secondary | ICD-10-CM | POA: Diagnosis not present

## 2015-06-10 DIAGNOSIS — E43 Unspecified severe protein-calorie malnutrition: Secondary | ICD-10-CM | POA: Diagnosis not present

## 2015-06-10 DIAGNOSIS — I82403 Acute embolism and thrombosis of unspecified deep veins of lower extremity, bilateral: Secondary | ICD-10-CM | POA: Diagnosis not present

## 2015-06-10 DIAGNOSIS — J441 Chronic obstructive pulmonary disease with (acute) exacerbation: Secondary | ICD-10-CM | POA: Diagnosis not present

## 2015-06-10 DIAGNOSIS — E039 Hypothyroidism, unspecified: Secondary | ICD-10-CM | POA: Diagnosis not present

## 2015-06-16 ENCOUNTER — Ambulatory Visit: Payer: Medicare Other | Admitting: Cardiovascular Disease

## 2015-06-16 ENCOUNTER — Encounter: Payer: Self-pay | Admitting: *Deleted

## 2015-06-17 ENCOUNTER — Other Ambulatory Visit (HOSPITAL_COMMUNITY): Payer: Medicare Other

## 2015-06-23 DIAGNOSIS — E43 Unspecified severe protein-calorie malnutrition: Secondary | ICD-10-CM | POA: Diagnosis not present

## 2015-06-23 DIAGNOSIS — I130 Hypertensive heart and chronic kidney disease with heart failure and stage 1 through stage 4 chronic kidney disease, or unspecified chronic kidney disease: Secondary | ICD-10-CM | POA: Diagnosis not present

## 2015-06-23 DIAGNOSIS — Z5181 Encounter for therapeutic drug level monitoring: Secondary | ICD-10-CM | POA: Diagnosis not present

## 2015-06-23 DIAGNOSIS — N184 Chronic kidney disease, stage 4 (severe): Secondary | ICD-10-CM | POA: Diagnosis not present

## 2015-06-23 DIAGNOSIS — I5030 Unspecified diastolic (congestive) heart failure: Secondary | ICD-10-CM | POA: Diagnosis not present

## 2015-06-23 DIAGNOSIS — Z7901 Long term (current) use of anticoagulants: Secondary | ICD-10-CM | POA: Diagnosis not present

## 2015-06-23 DIAGNOSIS — J441 Chronic obstructive pulmonary disease with (acute) exacerbation: Secondary | ICD-10-CM | POA: Diagnosis not present

## 2015-06-23 DIAGNOSIS — Z7952 Long term (current) use of systemic steroids: Secondary | ICD-10-CM | POA: Diagnosis not present

## 2015-06-23 DIAGNOSIS — E039 Hypothyroidism, unspecified: Secondary | ICD-10-CM | POA: Diagnosis not present

## 2015-06-23 DIAGNOSIS — F17210 Nicotine dependence, cigarettes, uncomplicated: Secondary | ICD-10-CM | POA: Diagnosis not present

## 2015-06-23 DIAGNOSIS — I82403 Acute embolism and thrombosis of unspecified deep veins of lower extremity, bilateral: Secondary | ICD-10-CM | POA: Diagnosis not present

## 2015-06-25 ENCOUNTER — Emergency Department (HOSPITAL_COMMUNITY): Payer: Medicare Other

## 2015-06-25 ENCOUNTER — Emergency Department (HOSPITAL_COMMUNITY)
Admission: EM | Admit: 2015-06-25 | Discharge: 2015-06-25 | Disposition: A | Discharge status: Home / Self Care | Payer: Medicare Other | Attending: Emergency Medicine | Admitting: Emergency Medicine

## 2015-06-25 ENCOUNTER — Encounter (HOSPITAL_COMMUNITY): Payer: Self-pay | Admitting: Nurse Practitioner

## 2015-06-25 DIAGNOSIS — J449 Chronic obstructive pulmonary disease, unspecified: Secondary | ICD-10-CM | POA: Diagnosis not present

## 2015-06-25 DIAGNOSIS — I10 Essential (primary) hypertension: Secondary | ICD-10-CM | POA: Diagnosis not present

## 2015-06-25 DIAGNOSIS — J441 Chronic obstructive pulmonary disease with (acute) exacerbation: Secondary | ICD-10-CM | POA: Insufficient documentation

## 2015-06-25 DIAGNOSIS — Z79899 Other long term (current) drug therapy: Secondary | ICD-10-CM | POA: Insufficient documentation

## 2015-06-25 DIAGNOSIS — Z96659 Presence of unspecified artificial knee joint: Secondary | ICD-10-CM | POA: Diagnosis not present

## 2015-06-25 DIAGNOSIS — Z87891 Personal history of nicotine dependence: Secondary | ICD-10-CM | POA: Insufficient documentation

## 2015-06-25 DIAGNOSIS — Z7901 Long term (current) use of anticoagulants: Secondary | ICD-10-CM | POA: Insufficient documentation

## 2015-06-25 DIAGNOSIS — R0602 Shortness of breath: Secondary | ICD-10-CM | POA: Diagnosis not present

## 2015-06-25 HISTORY — DX: Chronic obstructive pulmonary disease, unspecified: J44.9

## 2015-06-25 LAB — BASIC METABOLIC PANEL
Anion gap: 8 (ref 5–15)
BUN: 39 mg/dL — AB (ref 6–20)
CO2: 19 mmol/L — ABNORMAL LOW (ref 22–32)
CREATININE: 2.76 mg/dL — AB (ref 0.44–1.00)
Calcium: 10.2 mg/dL (ref 8.9–10.3)
Chloride: 112 mmol/L — ABNORMAL HIGH (ref 101–111)
GFR calc Af Amer: 18 mL/min — ABNORMAL LOW (ref >60)
GFR, EST NON AFRICAN AMERICAN: 15 mL/min — AB (ref >60)
GLUCOSE: 95 mg/dL (ref 65–99)
Potassium: 4.1 mmol/L (ref 3.5–5.1)
SODIUM: 139 mmol/L (ref 135–145)

## 2015-06-25 LAB — CBC
HEMATOCRIT: 35.4 % — AB (ref 36.0–46.0)
Hemoglobin: 11.4 g/dL — ABNORMAL LOW (ref 12.0–15.0)
MCH: 34 pg (ref 26.0–34.0)
MCHC: 32.2 g/dL (ref 30.0–36.0)
MCV: 105.7 fL — AB (ref 78.0–100.0)
PLATELETS: 197 10*3/uL (ref 150–400)
RBC: 3.35 MIL/uL — ABNORMAL LOW (ref 3.87–5.11)
RDW: 14.8 % (ref 11.5–15.5)
WBC: 4.1 10*3/uL (ref 4.0–10.5)

## 2015-06-25 LAB — BRAIN NATRIURETIC PEPTIDE: B NATRIURETIC PEPTIDE 5: 644 pg/mL — AB (ref 0.0–100.0)

## 2015-06-25 LAB — TROPONIN I: Troponin I: 0.03 ng/mL (ref <0.031)

## 2015-06-25 LAB — PROTIME-INR
INR: 2.08 — ABNORMAL HIGH (ref 0.00–1.49)
PROTHROMBIN TIME: 23.3 s — AB (ref 11.6–15.2)

## 2015-06-25 NOTE — ED Provider Notes (Signed)
CSN: [id number]     Arrival date & time 06/25/15  1229 History   First MD Initiated Contact with Patient 06/25/15 1452     Chief Complaint  Patient presents with  . Shortness of Breath      Patient is a 82 y.o. female presenting with shortness of breath.  Shortness of Breath Associated symptoms: cough   Associated symptoms: no abdominal pain, no chest pain, no headaches, no rash and no vomiting   patient presents with shortness of breath and a cough. History of chronic COPD. Has been worsening shortness of breath with little bit of production with a cough for the last 2-3 days. Somewhat decreased appetite. Also dull headache. 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 Also found to be in atrial fibrillation somewhat recently. No fevers. No abdominal pain. Does have swelling in both of her legs. The swelling is not unusual for her. No relief with an inhaler at home.   Past Medical History  Diagnosis Date  . Thyroid disease   . Hypertension   . Gout   . Pneumonia   . Arthritis   . COPD (chronic obstructive pulmonary disease) [facility])    Past Surgical History  Procedure Laterality Date  . Abdominal hysterectomy    . Total knee arthroplasty     Family History  Problem Relation Age of Onset  . Stroke Neg Hx   . CAD Neg Hx    Social History  Substance Use Topics  . Smoking status: Former Smoker -- 0.25 packs/day for 20 years    Types: Cigarettes    Quit date: 03/12/2015  . Smokeless tobacco: Never Used  . Alcohol Use: No   OB History    No data available     Review of Systems  Constitutional: Positive for appetite change and fatigue. Negative for chills and activity change.  Eyes: Negative for pain.  Respiratory: Positive for cough and shortness of breath. Negative for chest tightness.   Cardiovascular: Positive for leg swelling. Negative for chest pain.  Gastrointestinal: Negative for nausea, vomiting, abdominal pain and diarrhea.  Genitourinary: Negative  for flank pain.  Musculoskeletal: Negative for back pain and neck stiffness.  Skin: Negative for rash.  Neurological: Negative for weakness, numbness and headaches.  Psychiatric/Behavioral: Negative for behavioral problems.      Allergies  Review of patient's allergies indicates no known allergies.  Home Medications   Prior to Admission medications   Medication Sig Start Date End Date Taking? Authorizing Provider  albuterol (PROVENTIL HFA;VENTOLIN HFA) 108 (90 Base) MCG/ACT inhaler Inhale 2 puffs into the lungs every 6 (six) hours as needed for wheezing or shortness of breath. 05/16/15  Yes Mihai Croitoru, MD  allopurinol (ZYLOPRIM) 100 MG tablet Take 100 mg by mouth every morning.   Yes Historical Provider, MD  diltiazem (CARDIZEM SR) 120 MG 12 hr capsule Take 1 capsule (120 mg total) by mouth 2 (two) times daily. 06/02/15  Yes Mihai Croitoru, MD  feeding supplement, ENSURE COMPLETE, (ENSURE COMPLETE) LIQD Take 237 mLs by mouth 2 (two) times daily between meals. 12/18/13  Yes Nishant Dhungel, MD  methimazole (TAPAZOLE) 5 MG tablet Take 5 mg by mouth every morning.    Yes Historical Provider, MD  omeprazole (PRILOSEC) 40 MG capsule Take 40 mg by mouth 2 (two) times daily.  11/04/12  Yes Historical Provider, MD  polyethylene glycol (MIRALAX / GLYCOLAX) packet Take 17 g by mouth daily. Patient taking differently: Take 17 g by mouth daily as  needed for moderate constipation.  03/20/15  Yes Osvaldo ShipperGokul Krishnan, MD  senna (SENOKOT) 8.6 MG TABS tablet Take 1 tablet (8.6 mg total) by mouth daily. 12/18/13  Yes Nishant Dhungel, MD  sodium bicarbonate 650 MG tablet Take 1 tablet (650 mg total) by mouth 3 (three) times daily. 03/20/15  Yes Osvaldo ShipperGokul Krishnan, MD  SYMBICORT 160-4.5 MCG/ACT inhaler Inhale 1 puff into the lungs 2 (two) times daily. 05/22/15  Yes Historical Provider, MD  traMADol (ULTRAM) 50 MG tablet Take 50-100 mg by mouth every 6 (six) hours as needed for moderate pain.  10/31/12  Yes Historical  Provider, MD  warfarin (COUMADIN) 2 MG tablet Take 1 tablet (2 mg total) by mouth daily at 6 PM. 03/20/15  Yes Osvaldo ShipperGokul Krishnan, MD   BP 145/85 mmHg  Pulse 80  Temp(Src) 98.4 F (36.9 C) (Oral)  Resp 26  SpO2 99% Physical Exam  Constitutional: She appears well-developed.  HENT:  Head: Atraumatic.  Neck: Neck supple.  Cardiovascular: Normal rate.   Pulmonary/Chest: Effort normal.  Mildly harsh breath sounds.  Abdominal: Soft. There is no tenderness.  Musculoskeletal: She exhibits edema.  Moderate pitting edema to bilateral lower legs.  Neurological: She is alert.  Skin: Skin is warm.    ED Course  Procedures (including critical care time) Labs Review Labs Reviewed  BASIC METABOLIC PANEL - Abnormal; Notable for the following:    Chloride 112 (*)    CO2 19 (*)    BUN 39 (*)    Creatinine, Ser 2.76 (*)    GFR calc non Af Amer 15 (*)    GFR calc Af Amer 18 (*)    All other components within normal limits  CBC - Abnormal; Notable for the following:    RBC 3.35 (*)    Hemoglobin 11.4 (*)    HCT 35.4 (*)    MCV 105.7 (*)    All other components within normal limits  BRAIN NATRIURETIC PEPTIDE - Abnormal; Notable for the following:    B Natriuretic Peptide 644.0 (*)    All other components within normal limits  PROTIME-INR - Abnormal; Notable for the following:    Prothrombin Time 23.3 (*)    INR 2.08 (*)    All other components within normal limits  TROPONIN I    Imaging Review Dg Chest 2 View  06/25/2015  CLINICAL DATA:  Shortness of breath for 4 weeks. EXAM: CHEST  2 VIEW COMPARISON:  04/29/2015 FINDINGS: Mild hyperinflation. Heart size is slightly enlarged but stable. Atherosclerotic calcifications involving the thoracic aorta. Lung markings are slightly coarse and suggestive for chronic changes. There is no focal airspace disease or pulmonary edema. No acute bone abnormality. No large pleural effusions. IMPRESSION: No active cardiopulmonary disease. Mild enlargement of  the cardiac silhouette is unchanged. Electronically Signed   By: Richarda OverlieAdam  Henn M.D.   On: 06/25/2015 16:56   I have personally reviewed and evaluated these images and lab results as part of my medical decision-making.   EKG Interpretation   Date/Time:  Wednesday June 25 2015 12:50:31 EDT Ventricular Rate:  74 PR Interval:    QRS Duration: 104 QT Interval:  392 QTC Calculation: 435 R Axis:   -65 Text Interpretation:  Atrial fibrillation Left axis deviation Low voltage  QRS Inferior infarct , age undetermined Cannot rule out Anterior infarct ,  age undetermined T wave abnormality, consider lateral ischemia Abnormal  ECG Confirmed by Rubin PayorPICKERING  MD, Harrold DonathNATHAN [phone]([id number]) on 06/25/2015 2:53:16 PM      MDM  Final diagnoses:  Chronic obstructive pulmonary disease, unspecified COPD type (HCC)    Patient was shortness of breath. Appears to be COPD exacerbation. Feels better after treatment will discharge home.    Benjiman Core, MD 06/26/15 [phone]

## 2015-06-25 NOTE — ED Notes (Signed)
Pt c/o 2 day history of increased SOB and 1 day history of headache. She has tried her inhalers at home with no relief. Pt has been sweaty but denies fevers. She reports "fuzzy vision." Denies cough, lightheadedness, weakness. She is alert and breathing easily in triage

## 2015-06-25 NOTE — Discharge Instructions (Signed)

## 2015-06-25 NOTE — ED Notes (Signed)
Spoke with main lab about missing Troponin.

## 2015-07-01 DIAGNOSIS — J449 Chronic obstructive pulmonary disease, unspecified: Secondary | ICD-10-CM | POA: Diagnosis not present

## 2015-07-01 DIAGNOSIS — I10 Essential (primary) hypertension: Secondary | ICD-10-CM | POA: Diagnosis not present

## 2015-07-01 DIAGNOSIS — I825Y9 Chronic embolism and thrombosis of unspecified deep veins of unspecified proximal lower extremity: Secondary | ICD-10-CM | POA: Diagnosis not present

## 2015-07-01 DIAGNOSIS — E049 Nontoxic goiter, unspecified: Secondary | ICD-10-CM | POA: Diagnosis not present

## 2015-07-03 DIAGNOSIS — J441 Chronic obstructive pulmonary disease with (acute) exacerbation: Secondary | ICD-10-CM | POA: Diagnosis not present

## 2015-07-03 DIAGNOSIS — N184 Chronic kidney disease, stage 4 (severe): Secondary | ICD-10-CM | POA: Diagnosis not present

## 2015-07-03 DIAGNOSIS — Z7952 Long term (current) use of systemic steroids: Secondary | ICD-10-CM | POA: Diagnosis not present

## 2015-07-03 DIAGNOSIS — I130 Hypertensive heart and chronic kidney disease with heart failure and stage 1 through stage 4 chronic kidney disease, or unspecified chronic kidney disease: Secondary | ICD-10-CM | POA: Diagnosis not present

## 2015-07-03 DIAGNOSIS — I5030 Unspecified diastolic (congestive) heart failure: Secondary | ICD-10-CM | POA: Diagnosis not present

## 2015-07-03 DIAGNOSIS — E039 Hypothyroidism, unspecified: Secondary | ICD-10-CM | POA: Diagnosis not present

## 2015-07-03 DIAGNOSIS — E43 Unspecified severe protein-calorie malnutrition: Secondary | ICD-10-CM | POA: Diagnosis not present

## 2015-07-03 DIAGNOSIS — Z5181 Encounter for therapeutic drug level monitoring: Secondary | ICD-10-CM | POA: Diagnosis not present

## 2015-07-03 DIAGNOSIS — Z7901 Long term (current) use of anticoagulants: Secondary | ICD-10-CM | POA: Diagnosis not present

## 2015-07-03 DIAGNOSIS — F17210 Nicotine dependence, cigarettes, uncomplicated: Secondary | ICD-10-CM | POA: Diagnosis not present

## 2015-07-03 DIAGNOSIS — I82403 Acute embolism and thrombosis of unspecified deep veins of lower extremity, bilateral: Secondary | ICD-10-CM | POA: Diagnosis not present

## 2015-07-05 ENCOUNTER — Other Ambulatory Visit: Payer: Self-pay

## 2015-07-05 ENCOUNTER — Observation Stay (HOSPITAL_COMMUNITY)
Admission: EM | Admit: 2015-07-05 | Discharge: 2015-07-10 | Disposition: A | Discharge status: Home / Self Care | Payer: Medicare Other | Attending: Internal Medicine | Admitting: Internal Medicine

## 2015-07-05 ENCOUNTER — Emergency Department (HOSPITAL_COMMUNITY): Payer: Medicare Other

## 2015-07-05 ENCOUNTER — Encounter (HOSPITAL_COMMUNITY): Payer: Self-pay | Admitting: Emergency Medicine

## 2015-07-05 DIAGNOSIS — I340 Nonrheumatic mitral (valve) insufficiency: Secondary | ICD-10-CM | POA: Diagnosis not present

## 2015-07-05 DIAGNOSIS — I4891 Unspecified atrial fibrillation: Secondary | ICD-10-CM | POA: Diagnosis not present

## 2015-07-05 DIAGNOSIS — I481 Persistent atrial fibrillation: Secondary | ICD-10-CM | POA: Diagnosis not present

## 2015-07-05 DIAGNOSIS — I10 Essential (primary) hypertension: Secondary | ICD-10-CM | POA: Diagnosis present

## 2015-07-05 DIAGNOSIS — Z87891 Personal history of nicotine dependence: Secondary | ICD-10-CM | POA: Insufficient documentation

## 2015-07-05 DIAGNOSIS — R636 Underweight: Secondary | ICD-10-CM | POA: Insufficient documentation

## 2015-07-05 DIAGNOSIS — Z7901 Long term (current) use of anticoagulants: Secondary | ICD-10-CM | POA: Insufficient documentation

## 2015-07-05 DIAGNOSIS — I509 Heart failure, unspecified: Secondary | ICD-10-CM | POA: Diagnosis not present

## 2015-07-05 DIAGNOSIS — N184 Chronic kidney disease, stage 4 (severe): Secondary | ICD-10-CM | POA: Diagnosis not present

## 2015-07-05 DIAGNOSIS — E0590 Thyrotoxicosis, unspecified without thyrotoxic crisis or storm: Secondary | ICD-10-CM | POA: Diagnosis not present

## 2015-07-05 DIAGNOSIS — I110 Hypertensive heart disease with heart failure: Secondary | ICD-10-CM | POA: Diagnosis not present

## 2015-07-05 DIAGNOSIS — I480 Paroxysmal atrial fibrillation: Secondary | ICD-10-CM | POA: Diagnosis not present

## 2015-07-05 DIAGNOSIS — R0600 Dyspnea, unspecified: Secondary | ICD-10-CM

## 2015-07-05 DIAGNOSIS — I099 Rheumatic heart disease, unspecified: Secondary | ICD-10-CM | POA: Diagnosis not present

## 2015-07-05 DIAGNOSIS — Z96659 Presence of unspecified artificial knee joint: Secondary | ICD-10-CM | POA: Insufficient documentation

## 2015-07-05 DIAGNOSIS — M1990 Unspecified osteoarthritis, unspecified site: Secondary | ICD-10-CM | POA: Diagnosis not present

## 2015-07-05 DIAGNOSIS — Z681 Body mass index (BMI) 19 or less, adult: Secondary | ICD-10-CM | POA: Diagnosis not present

## 2015-07-05 DIAGNOSIS — Z515 Encounter for palliative care: Secondary | ICD-10-CM | POA: Insufficient documentation

## 2015-07-05 DIAGNOSIS — D631 Anemia in chronic kidney disease: Secondary | ICD-10-CM | POA: Diagnosis not present

## 2015-07-05 DIAGNOSIS — M109 Gout, unspecified: Secondary | ICD-10-CM | POA: Insufficient documentation

## 2015-07-05 DIAGNOSIS — I272 Other secondary pulmonary hypertension: Secondary | ICD-10-CM | POA: Insufficient documentation

## 2015-07-05 DIAGNOSIS — I429 Cardiomyopathy, unspecified: Secondary | ICD-10-CM | POA: Diagnosis not present

## 2015-07-05 DIAGNOSIS — J449 Chronic obstructive pulmonary disease, unspecified: Secondary | ICD-10-CM | POA: Diagnosis present

## 2015-07-05 DIAGNOSIS — I130 Hypertensive heart and chronic kidney disease with heart failure and stage 1 through stage 4 chronic kidney disease, or unspecified chronic kidney disease: Secondary | ICD-10-CM | POA: Insufficient documentation

## 2015-07-05 DIAGNOSIS — Z7189 Other specified counseling: Secondary | ICD-10-CM | POA: Insufficient documentation

## 2015-07-05 DIAGNOSIS — R0602 Shortness of breath: Secondary | ICD-10-CM | POA: Diagnosis not present

## 2015-07-05 DIAGNOSIS — Z8249 Family history of ischemic heart disease and other diseases of the circulatory system: Secondary | ICD-10-CM | POA: Insufficient documentation

## 2015-07-05 DIAGNOSIS — J9620 Acute and chronic respiratory failure, unspecified whether with hypoxia or hypercapnia: Secondary | ICD-10-CM | POA: Diagnosis not present

## 2015-07-05 DIAGNOSIS — I5043 Acute on chronic combined systolic (congestive) and diastolic (congestive) heart failure: Secondary | ICD-10-CM | POA: Diagnosis not present

## 2015-07-05 DIAGNOSIS — Z66 Do not resuscitate: Secondary | ICD-10-CM | POA: Insufficient documentation

## 2015-07-05 DIAGNOSIS — I4821 Permanent atrial fibrillation: Secondary | ICD-10-CM | POA: Insufficient documentation

## 2015-07-05 DIAGNOSIS — D649 Anemia, unspecified: Secondary | ICD-10-CM | POA: Diagnosis present

## 2015-07-05 LAB — URINE MICROSCOPIC-ADD ON
Bacteria, UA: NONE SEEN
WBC UA: NONE SEEN WBC/hpf (ref 0–5)

## 2015-07-05 LAB — BASIC METABOLIC PANEL
Anion gap: 8 (ref 5–15)
BUN: 37 mg/dL — AB (ref 6–20)
CO2: 19 mmol/L — ABNORMAL LOW (ref 22–32)
Calcium: 10.2 mg/dL (ref 8.9–10.3)
Chloride: 110 mmol/L (ref 101–111)
Creatinine, Ser: 2.61 mg/dL — ABNORMAL HIGH (ref 0.44–1.00)
GFR calc Af Amer: 19 mL/min — ABNORMAL LOW (ref >60)
GFR, EST NON AFRICAN AMERICAN: 16 mL/min — AB (ref >60)
GLUCOSE: 107 mg/dL — AB (ref 65–99)
POTASSIUM: 3.8 mmol/L (ref 3.5–5.1)
Sodium: 137 mmol/L (ref 135–145)

## 2015-07-05 LAB — CBC WITH DIFFERENTIAL/PLATELET
Basophils Absolute: 0 10*3/uL (ref 0.0–0.1)
Basophils Relative: 0 %
EOS PCT: 2 %
Eosinophils Absolute: 0.1 10*3/uL (ref 0.0–0.7)
HCT: 30.8 % — ABNORMAL LOW (ref 36.0–46.0)
Hemoglobin: 10.3 g/dL — ABNORMAL LOW (ref 12.0–15.0)
LYMPHS ABS: 0.9 10*3/uL (ref 0.7–4.0)
LYMPHS PCT: 18 %
MCH: 33.2 pg (ref 26.0–34.0)
MCHC: 33.4 g/dL (ref 30.0–36.0)
MCV: 99.4 fL (ref 78.0–100.0)
MONO ABS: 0.3 10*3/uL (ref 0.1–1.0)
Monocytes Relative: 7 %
Neutro Abs: 3.9 10*3/uL (ref 1.7–7.7)
Neutrophils Relative %: 73 %
PLATELETS: 292 10*3/uL (ref 150–400)
RBC: 3.1 MIL/uL — AB (ref 3.87–5.11)
RDW: 14.3 % (ref 11.5–15.5)
WBC: 5.2 10*3/uL (ref 4.0–10.5)

## 2015-07-05 LAB — URINALYSIS, ROUTINE W REFLEX MICROSCOPIC
Bilirubin Urine: NEGATIVE
GLUCOSE, UA: NEGATIVE mg/dL
KETONES UR: NEGATIVE mg/dL
LEUKOCYTES UA: NEGATIVE
Nitrite: NEGATIVE
PROTEIN: 100 mg/dL — AB
Specific Gravity, Urine: 1.014 (ref 1.005–1.030)
pH: 7 (ref 5.0–8.0)

## 2015-07-05 LAB — BRAIN NATRIURETIC PEPTIDE: B Natriuretic Peptide: 719.1 pg/mL — ABNORMAL HIGH (ref 0.0–100.0)

## 2015-07-05 LAB — TSH: TSH: 1.606 u[IU]/mL (ref 0.350–4.500)

## 2015-07-05 LAB — PROTIME-INR
INR: 2.12 — AB (ref 0.00–1.49)
PROTHROMBIN TIME: 23.6 s — AB (ref 11.6–15.2)

## 2015-07-05 LAB — TROPONIN I: TROPONIN I: 0.03 ng/mL (ref <0.031)

## 2015-07-05 LAB — I-STAT TROPONIN, ED: Troponin i, poc: 0.05 ng/mL (ref 0.00–0.08)

## 2015-07-05 LAB — POC OCCULT BLOOD, ED: Fecal Occult Bld: POSITIVE — AB

## 2015-07-05 MED ORDER — ENSURE ENLIVE PO LIQD
237.0000 mL | Freq: Two times a day (BID) | ORAL | Status: DC
  Administered 2015-07-05 – 2015-07-10 (×10): 237 mL via ORAL

## 2015-07-05 MED ORDER — FUROSEMIDE 10 MG/ML IJ SOLN
40.0000 mg | Freq: Once | INTRAMUSCULAR | Status: AC
  Administered 2015-07-05: 40 mg via INTRAVENOUS
  Filled 2015-07-05: qty 4

## 2015-07-05 MED ORDER — SENNA 8.6 MG PO TABS
1.0000 | ORAL_TABLET | Freq: Every day | ORAL | Status: DC
  Administered 2015-07-05 – 2015-07-10 (×6): 8.6 mg via ORAL
  Filled 2015-07-05 (×6): qty 1

## 2015-07-05 MED ORDER — POLYETHYLENE GLYCOL 3350 17 G PO PACK: 17.0000 g | PACK | Freq: Every day | ORAL | Status: DC | PRN

## 2015-07-05 MED ORDER — TRAMADOL HCL 50 MG PO TABS
50.0000 mg | ORAL_TABLET | Freq: Four times a day (QID) | ORAL | Status: DC | PRN
  Administered 2015-07-05: 100 mg via ORAL
  Administered 2015-07-05: 50 mg via ORAL
  Administered 2015-07-06 – 2015-07-07 (×5): 100 mg via ORAL
  Administered 2015-07-07: 50 mg via ORAL
  Administered 2015-07-08 – 2015-07-10 (×7): 100 mg via ORAL
  Filled 2015-07-05: qty 2
  Filled 2015-07-05 (×2): qty 1
  Filled 2015-07-05 (×10): qty 2
  Filled 2015-07-05 (×2): qty 1
  Filled 2015-07-05: qty 2

## 2015-07-05 MED ORDER — WARFARIN - PHARMACIST DOSING INPATIENT
Freq: Every day | Status: DC
  Administered 2015-07-05 – 2015-07-09 (×5)

## 2015-07-05 MED ORDER — PANTOPRAZOLE SODIUM 40 MG PO TBEC
40.0000 mg | DELAYED_RELEASE_TABLET | Freq: Every day | ORAL | Status: DC
  Administered 2015-07-05 – 2015-07-10 (×6): 40 mg via ORAL
  Filled 2015-07-05 (×6): qty 1

## 2015-07-05 MED ORDER — SODIUM BICARBONATE 650 MG PO TABS
650.0000 mg | ORAL_TABLET | Freq: Three times a day (TID) | ORAL | Status: DC
  Administered 2015-07-05 – 2015-07-10 (×15): 650 mg via ORAL
  Filled 2015-07-05 (×15): qty 1

## 2015-07-05 MED ORDER — ENOXAPARIN SODIUM 40 MG/0.4ML ~~LOC~~ SOLN: 40.0000 mg | SUBCUTANEOUS | Status: DC

## 2015-07-05 MED ORDER — ALBUTEROL SULFATE HFA 108 (90 BASE) MCG/ACT IN AERS: 2.0000 | INHALATION_SPRAY | Freq: Four times a day (QID) | RESPIRATORY_TRACT | Status: DC | PRN

## 2015-07-05 MED ORDER — SODIUM CHLORIDE 0.9% FLUSH
3.0000 mL | Freq: Two times a day (BID) | INTRAVENOUS | Status: DC
  Administered 2015-07-05 – 2015-07-10 (×10): 3 mL via INTRAVENOUS

## 2015-07-05 MED ORDER — ONDANSETRON HCL 4 MG/2ML IJ SOLN: 4.0000 mg | Freq: Four times a day (QID) | INTRAMUSCULAR | Status: DC | PRN

## 2015-07-05 MED ORDER — METHIMAZOLE 5 MG PO TABS
5.0000 mg | ORAL_TABLET | Freq: Every morning | ORAL | Status: DC
  Administered 2015-07-05 – 2015-07-10 (×6): 5 mg via ORAL
  Filled 2015-07-05 (×6): qty 1

## 2015-07-05 MED ORDER — ALLOPURINOL 100 MG PO TABS
100.0000 mg | ORAL_TABLET | Freq: Every morning | ORAL | Status: DC
  Administered 2015-07-05 – 2015-07-10 (×6): 100 mg via ORAL
  Filled 2015-07-05 (×6): qty 1

## 2015-07-05 MED ORDER — FLUTICASONE FUROATE-VILANTEROL 100-25 MCG/INH IN AEPB
1.0000 | INHALATION_SPRAY | RESPIRATORY_TRACT | Status: DC
  Administered 2015-07-06 – 2015-07-10 (×5): 1 via RESPIRATORY_TRACT
  Filled 2015-07-05: qty 28

## 2015-07-05 MED ORDER — ALBUTEROL SULFATE (2.5 MG/3ML) 0.083% IN NEBU: 2.5000 mg | INHALATION_SOLUTION | Freq: Four times a day (QID) | RESPIRATORY_TRACT | Status: DC | PRN

## 2015-07-05 MED ORDER — DILTIAZEM HCL ER 60 MG PO CP12
120.0000 mg | ORAL_CAPSULE | Freq: Once | ORAL | Status: DC
  Administered 2015-07-05: 120 mg via ORAL
  Filled 2015-07-05: qty 2

## 2015-07-05 MED ORDER — FUROSEMIDE 10 MG/ML IJ SOLN
40.0000 mg | Freq: Two times a day (BID) | INTRAMUSCULAR | Status: DC
  Administered 2015-07-05: 40 mg via INTRAVENOUS
  Filled 2015-07-05: qty 4

## 2015-07-05 MED ORDER — SODIUM CHLORIDE 0.9% FLUSH: 3.0000 mL | INTRAVENOUS | Status: DC | PRN

## 2015-07-05 MED ORDER — MOMETASONE FURO-FORMOTEROL FUM 200-5 MCG/ACT IN AERO
2.0000 | INHALATION_SPRAY | Freq: Two times a day (BID) | RESPIRATORY_TRACT | Status: DC
  Filled 2015-07-05: qty 8.8

## 2015-07-05 MED ORDER — WARFARIN SODIUM 3 MG PO TABS
3.0000 mg | ORAL_TABLET | Freq: Once | ORAL | Status: AC
  Administered 2015-07-05: 3 mg via ORAL
  Filled 2015-07-05: qty 1

## 2015-07-05 MED ORDER — SODIUM CHLORIDE 0.9 % IV SOLN: 250.0000 mL | INTRAVENOUS | Status: DC | PRN

## 2015-07-05 MED ORDER — ACETAMINOPHEN 325 MG PO TABS
650.0000 mg | ORAL_TABLET | ORAL | Status: DC | PRN
  Administered 2015-07-05 – 2015-07-10 (×7): 650 mg via ORAL
  Filled 2015-07-05 (×8): qty 2

## 2015-07-05 NOTE — H&P (Addendum)
History and Physical    [name] Alexander NPT:[id number] DOB: [birth year] DOA: 07/05/2015  PCP: [name], MD Patient coming from: home  Chief Complaint: sob  HPI: [name] Alexander is a very pleasant 82 y.o. female with medical history significant chronic combined systolic diastolic heart failure, COPD not on home oxygen, former smoker, chronic kidney disease, hypertension, A. fib on Coumadin presents to the emergency department with the chief complaint of worsening shortness of breath. Initial evaluation reveals slightly elevated BNP, downward trending hemoglobin, upward trending creatinine, EKG with A. fib slightly accelerated rate and chest x-ray concerning for CHF.  Information is obtained from the patient and her family is at the bedside as well as the chart. He reports developing shortness of breath over the last 2 days. She does have COPD she has not had a cigarette in over a year she is not on home oxygen. Initially shortness of breath was worse with exertion yesterday  seem to persist even while at rest. Associated symptoms include intermediate nonproductive cough and worsening lower extremity edema. She denies a chest pain palpitations headache dizziness syncope or near syncopal. She denies fever chills headache dizziness. She denies abdominal pain nausea vomiting diarrhea does admit to occasional constipation.    ED Course: In the emergency department she received Lasix 40 mg IV and Cardizem 120 mg  Review of Systems: As per HPI otherwise 10 point review of systems negative.   Ambulatory Status: Ambulates with a walker. No reported falls in the last 3 months  Past Medical History  Diagnosis Date  . Thyroid disease   . Hypertension   . Gout   . Pneumonia   . Arthritis   . COPD (chronic obstructive pulmonary disease) [facility])     Past Surgical History  Procedure Laterality Date  . Abdominal hysterectomy    . Total knee arthroplasty      Social History   Social History  .  Marital Status: Widowed    Spouse Name: N/A  . Number of Children: N/A  . Years of Education: N/A   Occupational History  . Not on file.   Social History Main Topics  . Smoking status: Former Smoker -- 0.25 packs/day for 20 years    Types: Cigarettes    Quit date: 03/12/2015  . Smokeless tobacco: Never Used  . Alcohol Use: No  . Drug Use: No  . Sexual Activity: Not on file   Other Topics Concern  . Not on file   Social History Narrative    No Known Allergies  Family History  Problem Relation Age of Onset  . Stroke Neg Hx   . CAD Neg Hx     Prior to Admission medications   Medication Sig Start Date End Date Taking? Authorizing Provider  acetaminophen (TYLENOL) 500 MG tablet Take 500 mg by mouth every 6 (six) hours as needed for mild pain.   Yes Historical Provider, MD  albuterol (PROVENTIL HFA;VENTOLIN HFA) 108 (90 Base) MCG/ACT inhaler Inhale 2 puffs into the lungs every 6 (six) hours as needed for wheezing or shortness of breath. 05/16/15  Yes Mihai Croitoru, MD  diltiazem (CARDIZEM SR) 120 MG 12 hr capsule Take 1 capsule (120 mg total) by mouth 2 (two) times daily. 06/02/15  Yes Mihai Croitoru, MD  feeding supplement, ENSURE COMPLETE, (ENSURE COMPLETE) LIQD Take 237 mLs by mouth 2 (two) times daily between meals. 12/18/13  Yes Nishant Dhungel, MD  fluticasone furoate-vilanterol (BREO ELLIPTA) 100-25 MCG/INH AEPB Inhale 1 puff into the lungs  daily.   Yes Historical Provider, MD  furosemide (LASIX) 40 MG tablet Take 40 mg by mouth daily.   Yes Historical Provider, MD  methimazole (TAPAZOLE) 5 MG tablet Take 5 mg by mouth every morning.    Yes Historical Provider, MD  polyethylene glycol (MIRALAX / GLYCOLAX) packet Take 17 g by mouth daily. Patient taking differently: Take 17 g by mouth daily as needed for moderate constipation.  03/20/15  Yes Bonnielee Haff, MD  sodium bicarbonate 650 MG tablet Take 1 tablet (650 mg total) by mouth 3 (three) times daily. 03/20/15  Yes Bonnielee Haff, MD  SYMBICORT 160-4.5 MCG/ACT inhaler Inhale 1 puff into the lungs 2 (two) times daily. 05/22/15  Yes Historical Provider, MD  traMADol (ULTRAM) 50 MG tablet Take 50-100 mg by mouth every 6 (six) hours as needed for moderate pain.  10/31/12  Yes Historical Provider, MD  warfarin (COUMADIN) 2 MG tablet Take 1 tablet (2 mg total) by mouth daily at 6 PM. Patient taking differently: Take 1 mg by mouth 3 (three) times daily.  03/20/15  Yes Bonnielee Haff, MD    Physical Exam: Filed Vitals:   07/05/15 0831 07/05/15 0952  BP: 149/119 184/105  Pulse: 100 116  Temp: 98.2 F (36.8 C)   TempSrc: Oral   Resp: 28 20  SpO2: 96% 96%     General:  Appears Slightly anxious and frail only somewhat uncomfortable  Eyes:  PERRL, EOMI, normal lids, iris ENT:  grossly normal hearing, lips & tongue, mucous membranes of her mouth are moist and pink Neck:  no LAD, masses or thyromegaly +JVD Cardiovascular:  Irregularly irregular, no m/r/g. 1-2+ pitting LE edema.  Respiratory:  CTA bilaterally but distant. Only faint crackles in bilateral bases. No wheezing. Normal respiratory effort. Abdomen:  soft, ntnd, positive bowel sounds no guarding or rebounding Skin:  no rash or induration seen on limited exam Musculoskeletal:  grossly normal tone BUE/BLE, good ROM, no bony abnormality Psychiatric:  grossly normal mood and affect, speech fluent and appropriate, AOx3 Neurologic:  CN 2-12 grossly intact, moves all extremities in coordinated fashion, sensation intact  Labs on Admission: I have personally reviewed following labs and imaging studies  CBC:  Recent Labs Lab 07/05/15 0910  WBC 5.2  NEUTROABS 3.9  HGB 10.3*  HCT 30.8*  MCV 99.4  PLT 211   Basic Metabolic Panel:  Recent Labs Lab 07/05/15 0910  NA 137  K 3.8  CL 110  CO2 19*  GLUCOSE 107*  BUN 37*  CREATININE 2.61*  CALCIUM 10.2   GFR: CrCl cannot be calculated (Unknown ideal weight.). Liver Function Tests: No results for  input(s): AST, ALT, ALKPHOS, BILITOT, PROT, ALBUMIN in the last 168 hours. No results for input(s): LIPASE, AMYLASE in the last 168 hours. No results for input(s): AMMONIA in the last 168 hours. Coagulation Profile:  Recent Labs Lab 07/05/15 0910  INR 2.12*   Cardiac Enzymes: No results for input(s): CKTOTAL, CKMB, CKMBINDEX, TROPONINI in the last 168 hours. BNP (last 3 results) No results for input(s): PROBNP in the last 8760 hours. HbA1C: No results for input(s): HGBA1C in the last 72 hours. CBG: No results for input(s): GLUCAP in the last 168 hours. Lipid Profile: No results for input(s): CHOL, HDL, LDLCALC, TRIG, CHOLHDL, LDLDIRECT in the last 72 hours. Thyroid Function Tests: No results for input(s): TSH, T4TOTAL, FREET4, T3FREE, THYROIDAB in the last 72 hours. Anemia Panel: No results for input(s): VITAMINB12, FOLATE, FERRITIN, TIBC, IRON, RETICCTPCT in the last 72  hours. Urine analysis:    Component Value Date/Time   COLORURINE YELLOW 07/05/2015 0950   APPEARANCEUR CLEAR 07/05/2015 0950   LABSPEC 1.014 07/05/2015 0950   PHURINE 7.0 07/05/2015 0950   GLUCOSEU NEGATIVE 07/05/2015 0950   HGBUR SMALL* 07/05/2015 0950   BILIRUBINUR NEGATIVE 07/05/2015 0950   KETONESUR NEGATIVE 07/05/2015 0950   PROTEINUR 100* 07/05/2015 0950   UROBILINOGEN 0.2 12/12/2013 0414   NITRITE NEGATIVE 07/05/2015 0950   LEUKOCYTESUR NEGATIVE 07/05/2015 0950    Creatinine Clearance: CrCl cannot be calculated (Unknown ideal weight.).  Sepsis Labs: '@LABRCNTIP'$ (procalcitonin:4,lacticidven:4) )No results found for this or any previous visit (from the past 240 hour(s)).   Radiological Exams on Admission: Dg Chest 2 View  07/05/2015  CLINICAL DATA:  Shortness of breath EXAM: CHEST  2 VIEW COMPARISON:  06/25/2015 chest radiograph. FINDINGS: Stable cardiomediastinal silhouette with mild cardiomegaly. No pneumothorax. No pleural effusion. Mild pulmonary edema. IMPRESSION: Mild congestive heart  failure. Electronically Signed   By: Ilona Sorrel M.D.   On: 07/05/2015 10:01    EKG: Independently reviewed. Atrial fibrillation with rapid ventricular response Left axis deviation Non-specific intra-ventricular conduction delay Non-specific ST-t changes  Assessment/Plan Principal Problem:   Acute on chronic combined systolic and diastolic heart failure (HCC) Active Problems:   Hypertension   Hyperthyroidism   A-fib (HCC)   CKD (chronic kidney disease) stage 4, GFR 15-29 ml/min (HCC)   Normocytic anemia   CKD (chronic kidney disease), stage IV (HCC)   COPD (chronic obstructive pulmonary disease) ([place])   1. Acute on chronic combined systolic and diastolic heart failure. Chest x-ray with mild congestive heart failure. BNP 719. home medication includes lasix. Pitting edema bilateral lower extremities. She was given 40 mg Lasix IV in the emergency department -Admit to telemetry -Obtain a 2-D echo -Continue 40 mg Lasix IV twice a day -Monitor daily weights -Monitor intake and output -Track be met  #2. Hypertension -. Uncontrolled in the emergency department. Patient has not taken her home medications which includes diltiazem. -Continue diltiazem 120 mg -Monitor -When necessary hydralazine  #3. Chronic kidney disease stage III. Creatinine 2.61 on admission. Chart review indicates this is trending up from 3 months ago. Clearly related to above. -Monitor urine output -hold nephrotoxins as able -track bmet given need for lasix use  #4. COPD. Appears to be stable at baseline. I medications include inhalers. -Continue home meds -Oxygen supplementation as indicated  #5. A. fib. Rate somewhat accelerated on admission. [place] Vasc score 5. On Coumadin at home. INR 2.12 on admission. -Continue diltiazem -Coumadin per pharmacy  #6. Normocytic anemia. Likely related to chronic disease.  Hemoglobin 10.3 on admission which is slightly lower than her baseline. May be dilutional. Not sure of  asked colonoscopy. FOBT positive. Signs symptoms of active bleeding -Monitor -May benefit from outpatient colonoscopy  7. Hyperthyroidism. -check tsh-   DVT prophylaxis: on coumdin  Code Status: full  Family Communication: son at bedside  Disposition Plan: home  Consults called: none  Admission status: obs    Dyanne Carrel M MD [facility]  If 7PM-7AM, please contact night-coverage [email or web address] Password [facility]  07/05/2015, 11:57 AM    -- Attending MD note  Patient was seen, examined, treatment plan was discussed with the  Advance Practice Provider.  I have personally reviewed the clinical findings, lab, EKG, imaging studies and management of this patient in detail.I have also reviewed the orders written for this patient which were under my direction. I agree with the documentation, as recorded by the Advance Practice  Provider.   [name] Alexander is a 82 y.o. female with a Past Medical History of chronic systolic and diastolic chf, atrial fib, ckd3, copd, p/w worsening sob.  Gets very anxious. Denies adding extra salt, but have been eating a lot more canned goods recently.  Feels better after started on O2.  Exam Nad, pleasant cv irreg irreg Lungs fine crackles at bases. abd obese, soft, nt Le trace edema  A/p 1 chf exacerbation - admit tele, lasix iv bid, strict i/os. - echo  2. htn - stable 3. afib - rate controlled 4. ckd 3 - Watch renal function while being diuresed.  Maren Reamer, MD, [facility] Pager [phone], please call On-call for After hours. [facility]

## 2015-07-05 NOTE — ED Notes (Signed)
Attempted to call report

## 2015-07-05 NOTE — ED Provider Notes (Signed)
CSN: [id number]     Arrival date & time 07/05/15  [id number] History   First MD Initiated Contact with Patient 07/05/15 [phone]     Chief Complaint  Patient presents with  . Shortness of Breath     (Consider location/radiation/quality/duration/timing/severity/associated sxs/prior Treatment) Patient is a 82 y.o. female presenting with shortness of breath. The history is provided by the patient and a relative.  Shortness of Breath Associated symptoms: no abdominal pain, no chest pain, no fever, no headaches, no neck pain, no rash, no sore throat and no vomiting   Patient with hx copd, afib, presents c/o feeling sob in the past 2 days. Symptoms episodic, where feels will have worsening sob episode, will last a few minutes then improve. Occurs at rest.  No specific exacerbating or alleviating factors. Denies chest pain. Occasional non prod cough. No fever or chills. States compliant w normal meds. Mild lower leg swelling, no recent change.  No pleuritic pain/symptoms.      Past Medical History  Diagnosis Date  . Thyroid disease   . Hypertension   . Gout   . Pneumonia   . Arthritis   . COPD (chronic obstructive pulmonary disease) [facility](HCC)    Past Surgical History  Procedure Laterality Date  . Abdominal hysterectomy    . Total knee arthroplasty     Family History  Problem Relation Age of Onset  . Stroke Neg Hx   . CAD Neg Hx    Social History  Substance Use Topics  . Smoking status: Former Smoker -- 0.25 packs/day for 20 years    Types: Cigarettes    Quit date: 03/12/2015  . Smokeless tobacco: Never Used  . Alcohol Use: No   OB History    No data available     Review of Systems  Constitutional: Negative for fever and chills.  HENT: Negative for sore throat.   Eyes: Negative for visual disturbance.  Respiratory: Positive for shortness of breath.   Cardiovascular: Negative for chest pain and palpitations.  Gastrointestinal: Negative for vomiting, abdominal pain and diarrhea.   Genitourinary: Negative for flank pain.  Musculoskeletal: Negative for back pain and neck pain.  Skin: Negative for rash.  Neurological: Negative for weakness, numbness and headaches.  Hematological: Does not bruise/bleed easily.  Psychiatric/Behavioral: The patient is nervous/anxious.       Allergies  Review of patient's allergies indicates no known allergies.  Home Medications   Prior to Admission medications   Medication Sig Start Date End Date Taking? Authorizing Provider  albuterol (PROVENTIL HFA;VENTOLIN HFA) 108 (90 Base) MCG/ACT inhaler Inhale 2 puffs into the lungs every 6 (six) hours as needed for wheezing or shortness of breath. 05/16/15   Mihai Croitoru, MD  allopurinol (ZYLOPRIM) 100 MG tablet Take 100 mg by mouth every morning.    Historical Provider, MD  diltiazem (CARDIZEM SR) 120 MG 12 hr capsule Take 1 capsule (120 mg total) by mouth 2 (two) times daily. 06/02/15   Mihai Croitoru, MD  feeding supplement, ENSURE COMPLETE, (ENSURE COMPLETE) LIQD Take 237 mLs by mouth 2 (two) times daily between meals. 12/18/13   Nishant Dhungel, MD  methimazole (TAPAZOLE) 5 MG tablet Take 5 mg by mouth every morning.     Historical Provider, MD  omeprazole (PRILOSEC) 40 MG capsule Take 40 mg by mouth 2 (two) times daily.  11/04/12   Historical Provider, MD  polyethylene glycol (MIRALAX / GLYCOLAX) packet Take 17 g by mouth daily. Patient taking differently: Take 17 g by mouth daily as  needed for moderate constipation.  03/20/15   Osvaldo ShipperGokul Krishnan, MD  senna (SENOKOT) 8.6 MG TABS tablet Take 1 tablet (8.6 mg total) by mouth daily. 12/18/13   Nishant Dhungel, MD  sodium bicarbonate 650 MG tablet Take 1 tablet (650 mg total) by mouth 3 (three) times daily. 03/20/15   Osvaldo ShipperGokul Krishnan, MD  SYMBICORT 160-4.5 MCG/ACT inhaler Inhale 1 puff into the lungs 2 (two) times daily. 05/22/15   Historical Provider, MD  traMADol (ULTRAM) 50 MG tablet Take 50-100 mg by mouth every 6 (six) hours as needed for moderate  pain.  10/31/12   Historical Provider, MD  warfarin (COUMADIN) 2 MG tablet Take 1 tablet (2 mg total) by mouth daily at 6 PM. 03/20/15   Osvaldo ShipperGokul Krishnan, MD   BP 149/119 mmHg  Pulse 100  Temp(Src) 98.2 F (36.8 C) (Oral)  Resp 28  SpO2 96% Physical Exam  Constitutional: She appears well-developed and well-nourished. No distress.  HENT:  Mouth/Throat: Oropharynx is clear and moist.  Eyes: Conjunctivae are normal. No scleral icterus.  Neck: Neck supple. No tracheal deviation present.  Cardiovascular: Regular rhythm, normal heart sounds and intact distal pulses.   No murmur heard. Tachycardic, irregular.   Pulmonary/Chest: Effort normal. No respiratory distress.  Abdominal: Soft. Normal appearance and bowel sounds are normal. She exhibits no distension. There is no tenderness.  Musculoskeletal: She exhibits no tenderness.  Mild, symmetric lower leg and foot edema.   Neurological: She is alert.  Skin: Skin is warm and dry. No rash noted. She is not diaphoretic.  Psychiatric: She has a normal mood and affect.  Nursing note and vitals reviewed.   ED Course  Procedures (including critical care time) Labs Review   Results for orders placed or performed during the hospital encounter of 07/05/15  CBC with Differential  Result Value Ref Range   WBC 5.2 4.0 - 10.5 K/uL   RBC 3.10 (L) 3.87 - 5.11 MIL/uL   Hemoglobin 10.3 (L) 12.0 - 15.0 g/dL   HCT 16.130.8 (L) 09.636.0 - 04.546.0 %   MCV 99.4 78.0 - 100.0 fL   MCH 33.2 26.0 - 34.0 pg   MCHC 33.4 30.0 - 36.0 g/dL   RDW 40.914.3 81.111.5 - 91.415.5 %   Platelets 292 150 - 400 K/uL   Neutrophils Relative % 73 %   Neutro Abs 3.9 1.7 - 7.7 K/uL   Lymphocytes Relative 18 %   Lymphs Abs 0.9 0.7 - 4.0 K/uL   Monocytes Relative 7 %   Monocytes Absolute 0.3 0.1 - 1.0 K/uL   Eosinophils Relative 2 %   Eosinophils Absolute 0.1 0.0 - 0.7 K/uL   Basophils Relative 0 %   Basophils Absolute 0.0 0.0 - 0.1 K/uL  Basic metabolic panel  Result Value Ref Range   Sodium  137 135 - 145 mmol/L   Potassium 3.8 3.5 - 5.1 mmol/L   Chloride 110 101 - 111 mmol/L   CO2 19 (L) 22 - 32 mmol/L   Glucose, Bld 107 (H) 65 - 99 mg/dL   BUN 37 (H) 6 - 20 mg/dL   Creatinine, Ser 7.822.61 (H) 0.44 - 1.00 mg/dL   Calcium 95.610.2 8.9 - 21.310.3 mg/dL   GFR calc non Af Amer 16 (L) >60 mL/min   GFR calc Af Amer 19 (L) >60 mL/min   Anion gap 8 5 - 15  Brain natriuretic peptide  Result Value Ref Range   B Natriuretic Peptide 719.1 (H) 0.0 - 100.0 pg/mL  Protime-INR  Result Value  Ref Range   Prothrombin Time 23.6 (H) 11.6 - 15.2 seconds   INR 2.12 (H) 0.00 - 1.49  I-Stat Troponin, ED (not at [facility])  Result Value Ref Range   Troponin i, poc 0.05 0.00 - 0.08 ng/mL   Comment 3          POC occult blood, ED Provider will collect  Result Value Ref Range   Fecal Occult Bld POSITIVE (A) NEGATIVE   Dg Chest 2 View  07/05/2015  CLINICAL DATA:  Shortness of breath EXAM: CHEST  2 VIEW COMPARISON:  06/25/2015 chest radiograph. FINDINGS: Stable cardiomediastinal silhouette with mild cardiomegaly. No pneumothorax. No pleural effusion. Mild pulmonary edema. IMPRESSION: Mild congestive heart failure. Electronically Signed   By: Delbert Phenix M.D.   On: 07/05/2015 10:01   Dg Chest 2 View  06/25/2015  CLINICAL DATA:  Shortness of breath for 4 weeks. EXAM: CHEST  2 VIEW COMPARISON:  04/29/2015 FINDINGS: Mild hyperinflation. Heart size is slightly enlarged but stable. Atherosclerotic calcifications involving the thoracic aorta. Lung markings are slightly coarse and suggestive for chronic changes. There is no focal airspace disease or pulmonary edema. No acute bone abnormality. No large pleural effusions. IMPRESSION: No active cardiopulmonary disease. Mild enlargement of the cardiac silhouette is unchanged. Electronically Signed   By: Richarda Overlie M.D.   On: 06/25/2015 16:56      I have personally reviewed and evaluated these images and lab results as part of my medical decision-making.   EKG  Interpretation   Date/Time:  Saturday July 05 2015 08:36:38 EDT Ventricular Rate:  109 PR Interval:    QRS Duration: 96 QT Interval:  340 QTC Calculation: 457 R Axis:   -75 Text Interpretation:  Atrial fibrillation with rapid ventricular response  Left axis deviation Non-specific intra-ventricular conduction delay  Non-specific ST-t changes Confirmed by Denton Lank  MD, Caryn Bee ([id number]) on  07/05/2015 9:20:19 AM      MDM   Iv ns. Continuous pulse ox and monitor. o2 [place].  Labs. Cxr.  Reviewed nursing notes and prior charts for additional history.   Family notes episodes almost seem more c/w anxiety attacks.  Patient currently calm, alert.   hgb slowly drifting down over course past few months. Stool is medium brown in color, but is hemoccult positive.   BNP elevated, ? In part due to uncontrolled bp and afib/mildly rapid.  Vascular congestion on cxr. Lasix iv. Patient indicates has not yet had bp meds today - dose given.   Given chf, uncontrolled bp, dyspnea, will admit.       Cathren Laine, MD 07/05/15 1105

## 2015-07-05 NOTE — ED Notes (Signed)
Pt placed on 1L  for pt comfort.  O2 sats remain WNL

## 2015-07-05 NOTE — ED Notes (Signed)
Placed pt on bed pan.

## 2015-07-05 NOTE — Progress Notes (Signed)
ANTICOAGULATION CONSULT NOTE - Initial Consult  Pharmacy Consult for warfarin Indication: atrial fibrillation and hx DVT  No Known Allergies  Patient Measurements:    Vital Signs: Temp: 98.2 F (36.8 C) (06/24 0831) Temp Source: Oral (06/24 0831) BP: 184/105 mmHg (06/24 0952) Pulse Rate: 116 (06/24 0952)  Labs:  Recent Labs  07/05/15 0910  HGB 10.3*  HCT 30.8*  PLT 292  LABPROT 23.6*  INR 2.12*  CREATININE 2.61*    CrCl cannot be calculated (Unknown ideal weight.).   Medical History: Past Medical History  Diagnosis Date  . Thyroid disease   . Hypertension   . Gout   . Pneumonia   . Arthritis   . COPD (chronic obstructive pulmonary disease) (HCC)     Medications:  See electronic med list  Assessment: 82 y/o female who presented to the ED with SOB today. She takes chronic warfarin for Afib and hx DVT. Pharmacy consulted to manage inpatient.  INR is therapeutic at 2.12. No bleeding noted, Hb low at 10.3, platelets are normal. PTA regimen: spoke with family who reports patient is taking 1 mg TID; bottle states to take three 1 mg tabs daily, explained they could give all three tabs at once; last dose 6/23  Patient also newly prescribed Breo Ellipta and has also been taking Symbicort. Refill history would suggest patient was to stop Symbicort and start Breo. Asked family to clarify with PCP - will continue Breo while inpatient.  Goal of Therapy:  INR 2-3 Monitor platelets by anticoagulation protocol: Yes   Plan:  - Warfarin 3 mg PO tonight - INR daily - Monitor for s/sx of bleeding - [facility]  [facility] LLCJennifer [place], VermontPharm.D., BCPS Clinical Pharmacist Pager: [phone] 07/05/2015 11:57 AM

## 2015-07-05 NOTE — ED Notes (Signed)
Pt. Stated, I started having SOB this morning around 0300 this morning

## 2015-07-06 ENCOUNTER — Other Ambulatory Visit (HOSPITAL_COMMUNITY): Payer: Medicare Other

## 2015-07-06 DIAGNOSIS — J9620 Acute and chronic respiratory failure, unspecified whether with hypoxia or hypercapnia: Secondary | ICD-10-CM | POA: Diagnosis not present

## 2015-07-06 DIAGNOSIS — I4891 Unspecified atrial fibrillation: Secondary | ICD-10-CM | POA: Diagnosis not present

## 2015-07-06 DIAGNOSIS — N184 Chronic kidney disease, stage 4 (severe): Secondary | ICD-10-CM | POA: Diagnosis not present

## 2015-07-06 DIAGNOSIS — I5033 Acute on chronic diastolic (congestive) heart failure: Secondary | ICD-10-CM

## 2015-07-06 DIAGNOSIS — J438 Other emphysema: Secondary | ICD-10-CM | POA: Diagnosis not present

## 2015-07-06 DIAGNOSIS — N179 Acute kidney failure, unspecified: Secondary | ICD-10-CM | POA: Diagnosis not present

## 2015-07-06 DIAGNOSIS — I482 Chronic atrial fibrillation: Secondary | ICD-10-CM

## 2015-07-06 DIAGNOSIS — I509 Heart failure, unspecified: Secondary | ICD-10-CM | POA: Diagnosis not present

## 2015-07-06 DIAGNOSIS — I5043 Acute on chronic combined systolic (congestive) and diastolic (congestive) heart failure: Secondary | ICD-10-CM | POA: Diagnosis not present

## 2015-07-06 LAB — BASIC METABOLIC PANEL
Anion gap: 8 (ref 5–15)
BUN: 45 mg/dL — ABNORMAL HIGH (ref 6–20)
CALCIUM: 9.6 mg/dL (ref 8.9–10.3)
CHLORIDE: 108 mmol/L (ref 101–111)
CO2: 20 mmol/L — AB (ref 22–32)
CREATININE: 3.04 mg/dL — AB (ref 0.44–1.00)
GFR calc non Af Amer: 14 mL/min — ABNORMAL LOW (ref >60)
GFR, EST AFRICAN AMERICAN: 16 mL/min — AB (ref >60)
GLUCOSE: 92 mg/dL (ref 65–99)
Potassium: 4 mmol/L (ref 3.5–5.1)
Sodium: 136 mmol/L (ref 135–145)

## 2015-07-06 LAB — PROTIME-INR
INR: 2.05 — AB (ref 0.00–1.49)
Prothrombin Time: 23 seconds — ABNORMAL HIGH (ref 11.6–15.2)

## 2015-07-06 MED ORDER — WARFARIN SODIUM 3 MG PO TABS
3.0000 mg | ORAL_TABLET | Freq: Once | ORAL | Status: AC
Start: 2015-07-06 — End: 2015-07-06
  Administered 2015-07-06: 3 mg via ORAL
  Filled 2015-07-06: qty 1

## 2015-07-06 MED ORDER — DILTIAZEM HCL ER 60 MG PO CP12
120.0000 mg | ORAL_CAPSULE | Freq: Every day | ORAL | Status: DC
  Administered 2015-07-06 – 2015-07-08 (×3): 120 mg via ORAL
  Filled 2015-07-06 (×3): qty 2

## 2015-07-06 NOTE — Progress Notes (Addendum)
Patient ID: [name] Alexander, female   DOB: 08/13/1933, 82 y.o.   MRN: [id number]    PROGRESS NOTE    [name] Alexander  YQM:[id number] DOB: 08/13/1933 DOA: 07/05/2015  PCP: [name] Alexander   Brief Narrative:  82 y.o. female with chronic combined systolic diastolic heart failure, COPD not on home oxygen, former smoker, chronic kidney disease, hypertension, A. fib on Coumadin, presented to the emergency department with main concern of 1-2 days duration of progressively worsening dyspnea present with exertion and occasionally present at rest. Initial work up in ED notable for EKG with A. fib slightly accelerated rate and chest x-ray concerning for CHF.  Assessment & Plan:   Principal Problem:   Acute respiratory distress due to acute on chronic diastolic heart failure, atrial fib with RVR - Please note that patient presented with initial oxygen saturation 88% on room air and respiratory rate as high as 33 BPM - Patient was placed on oxygen via nasal cannula and currently maintaining oxygen saturations above 92% on 2 L [name] Alexander - Patient clinically stable this morning, please see detailed plan and management of acute on chronic diastolic heart failure below  - Also please see detailed plan and management for atrial fibrillation - Attempt to taper off oxygen as clinically possible  Active Problems:   Acute on chronic diastolic CHF - Review of records indicate chronic baseline diastolic CHF with no clear evidence of systolic CHF - Last echocardiogram in electronic system is from 2015 which indicates normal EF however, confirms diastolic CHF - Patient is taking Lasix 40 mg by mouth daily at home - She has been started on Lasix 40 mg IV twice a day here - Patient's weight is 102 pounds this morning and she does not look clinically volume overloaded - We will hold Lasix today as creatinine up from 2.6 --> 3.04 - Continue to monitor daily weights, strict intake and urine output - Echocardiogram  requested for further evaluation - No indication for cardiology consultation at this time but will be considered if clinically indicated    Atrial fibrillation with RVR, CHADS2 score 4 - At home patient is on Cardizem and Coumadin - Currently rate controlled - Continue same regimen   Acute on CKD (chronic kidney disease) stage 4, GFR 15-29 ml/min (HCC) - with baseline Cr ~2 in the past 3 months - now up likely from Lasix  - stop lasix today - reassess in AM with BMP     Normocytic anemia - and anemia of CKD - Hg stable with no signs of acute bleeding     Hyperthyroidism - continue Methimazole - TSH 1.606 which is WNL     COPD (chronic obstructive pulmonary disease) (HCC) - respiratory status stable this AM, oxygen stable and > 92% - attempt to taper off oxygen via [place]  DVT prophylaxis: pt on Coumadin  Code Status: Full  Family Communication: Patient at bedside  Disposition Plan: Home in AM  Consultants:   None  Procedures:   ECHO  Antimicrobials:   None    Subjective: No concerns this AM, no chest pain or dyspnea.   Objective: Filed Vitals:   07/05/15 1316 07/05/15 2005 07/05/15 2330 07/06/15 0400  BP:  125/59 124/77 142/84  Pulse:  87 95 97  Temp:  98.2 F (36.8 C) 98.3 F (36.8 C) 98.2 F (36.8 C)  TempSrc:  Oral Oral Oral  Resp:  20 20 20   Height: 5\' 4"  (1.626 m)     SpO2: 94% 100%  97% 97%    Intake/Output Summary (Last 24 hours) at 07/06/15 0856 Last data filed at 07/06/15 0600  Gross per 24 hour  Intake    480 ml  Output    600 ml  Net   -120 ml   There were no vitals filed for this visit.  Examination:  General exam: Appears calm and comfortable  Respiratory system: Respiratory effort normal. Diminished breath sounds at bases  Cardiovascular system: IRRR. No JVD, rubs, gallops or clicks. No pedal edema. Gastrointestinal system: Abdomen is nondistended, soft and nontender. No organomegaly or masses felt. Normal bowel sounds  heard. Central nervous system: Alert and oriented. No focal neurological deficits. Extremities: Symmetric 5 x 5 power.  Data Reviewed: I have personally reviewed following labs and imaging studies  CBC:  Recent Labs Lab 07/05/15 0910  WBC 5.2  NEUTROABS 3.9  HGB 10.3*  HCT 30.8*  MCV 99.4  PLT 292   Basic Metabolic Panel:  Recent Labs Lab 07/05/15 0910 07/06/15 0305  NA 137 136  K 3.8 4.0  CL 110 108  CO2 19* 20*  GLUCOSE 107* 92  BUN 37* 45*  CREATININE 2.61* 3.04*  CALCIUM 10.2 9.6   Coagulation Profile:  Recent Labs Lab 07/05/15 0910 07/06/15 0305  INR 2.12* 2.05*   Cardiac Enzymes:  Recent Labs Lab 07/05/15 1325  TROPONINI 0.03     Recent Labs  07/05/15 1325  TSH 1.606   Urine analysis:    Component Value Date/Time   COLORURINE YELLOW 07/05/2015 0950   APPEARANCEUR CLEAR 07/05/2015 0950   LABSPEC 1.014 07/05/2015 0950   PHURINE 7.0 07/05/2015 0950   GLUCOSEU NEGATIVE 07/05/2015 0950   HGBUR SMALL* 07/05/2015 0950   BILIRUBINUR NEGATIVE 07/05/2015 0950   KETONESUR NEGATIVE 07/05/2015 0950   PROTEINUR 100* 07/05/2015 0950   UROBILINOGEN 0.2 12/12/2013 0414   NITRITE NEGATIVE 07/05/2015 0950   LEUKOCYTESUR NEGATIVE 07/05/2015 0950   Radiology Studies: Dg Chest 2 View  07/05/2015  CLINICAL DATA:  Shortness of breath EXAM: CHEST  2 VIEW COMPARISON:  06/25/2015 chest radiograph. FINDINGS: Stable cardiomediastinal silhouette with mild cardiomegaly. No pneumothorax. No pleural effusion. Mild pulmonary edema. IMPRESSION: Mild congestive heart failure. Electronically Signed   By: [name] PhenixJason A Alexander M.D.   On: 07/05/2015 10:01    Scheduled Meds: . allopurinol  100 mg Oral q morning - 10a  . feeding supplement (ENSURE ENLIVE)  237 mL Oral BID BM  . fluticasone furoate-vilanterol  1 puff Inhalation Q24H  . furosemide  40 mg Intravenous BID  . methimazole  5 mg Oral q morning - 10a  . pantoprazole  40 mg Oral Daily  . senna  1 tablet Oral Daily  .  sodium bicarbonate  650 mg Oral TID  . sodium chloride flush  3 mL Intravenous Q12H  . Warfarin - Pharmacist Dosing Inpatient   Does not apply q1800   Continuous Infusions:   Time spent: 20 minutes   [name] PrestoMAGICK-Kahlee Metivier, [facility] Pager [phone]  If 7PM-7AM, please contact night-coverage [email or web address] Password [facility] [email or web address] 07/06/2015, 8:56 AM

## 2015-07-06 NOTE — Progress Notes (Signed)
ANTICOAGULATION CONSULT NOTE - Initial Consult  Pharmacy Consult for warfarin Indication: atrial fibrillation and hx DVT  No Known Allergies  Patient Measurements: Height: 5\' 4"  (162.6 cm) Weight: 101 lb 13.6 oz (46.2 kg) (scale A) IBW/kg (Calculated) : 54.7  Vital Signs: Temp: 98.2 F (36.8 C) (06/25 0400) Temp Source: Oral (06/25 0400) BP: 142/84 mmHg (06/25 0400) Pulse Rate: 97 (06/25 0400)  Labs:  Recent Labs  07/05/15 0910 07/05/15 1325 07/06/15 0305  HGB 10.3*  --   --   HCT 30.8*  --   --   PLT 292  --   --   LABPROT 23.6*  --  23.0*  INR 2.12*  --  2.05*  CREATININE 2.61*  --  3.04*  TROPONINI  --  0.03  --     Estimated Creatinine Clearance: 10.8 mL/min (by C-G formula based on Cr of 3.04).   Medical History: Past Medical History  Diagnosis Date  . Thyroid disease   . Hypertension   . Gout   . Pneumonia   . Arthritis   . COPD (chronic obstructive pulmonary disease) (HCC)     Medications:  See electronic med list  Assessment: [age] y/o female who presented to the ED with SOB today. She takes chronic warfarin for Afib and hx DVT. Pharmacy consulted to manage inpatient.  PTA regimen: spoke with family who reports patient is taking 1 mg TID; bottle states to take three 1 mg tabs daily, explained they could give all three tabs at once; last dose 6/23  INR today therapeutic @ 2.05. CBC stable, no bleeding   Goal of Therapy:  INR 2-3 Monitor platelets by anticoagulation protocol: Yes   Plan:  Warfarin 3 mg PO x1 tonight INR daily Monitor for s/s of bleeding  Monitor renal fxn, F/U ECHO results

## 2015-07-07 ENCOUNTER — Observation Stay (HOSPITAL_BASED_OUTPATIENT_CLINIC_OR_DEPARTMENT_OTHER): Payer: Medicare Other

## 2015-07-07 DIAGNOSIS — J438 Other emphysema: Secondary | ICD-10-CM

## 2015-07-07 DIAGNOSIS — R0600 Dyspnea, unspecified: Secondary | ICD-10-CM | POA: Diagnosis not present

## 2015-07-07 DIAGNOSIS — J9620 Acute and chronic respiratory failure, unspecified whether with hypoxia or hypercapnia: Secondary | ICD-10-CM | POA: Diagnosis not present

## 2015-07-07 DIAGNOSIS — N184 Chronic kidney disease, stage 4 (severe): Secondary | ICD-10-CM | POA: Diagnosis not present

## 2015-07-07 DIAGNOSIS — I482 Chronic atrial fibrillation: Secondary | ICD-10-CM | POA: Diagnosis not present

## 2015-07-07 DIAGNOSIS — I5043 Acute on chronic combined systolic (congestive) and diastolic (congestive) heart failure: Secondary | ICD-10-CM | POA: Diagnosis not present

## 2015-07-07 LAB — ECHOCARDIOGRAM COMPLETE
CHL CUP MV DEC (S): 162
E/e' ratio: 16.97
EWDT: 162 ms
FS: 24 % — AB (ref 28–44)
HEIGHTINCHES: 64 in
IV/PV OW: 0.95
LA ID, A-P, ES: 40 mm
LA diam end sys: 40 mm
LA diam index: 2.72 cm/m2
LA vol index: 65.6 mL/m2
LA vol: 96.5 mL
LAVOLA4C: 103 mL
LV E/e'average: 16.97
LV PW d: 10.8 mm — AB (ref 0.6–1.1)
LV SIMPSON'S DISK: 51
LV TDI E'MEDIAL: 9.49
LV dias vol index: 51 mL/m2
LV dias vol: 75 mL (ref 46–106)
LV sys vol: 37 mL (ref 14–42)
LVEEMED: 16.97
LVOT area: 2.84 cm2
LVOTD: 19 mm
LVSYSVOLIN: 25 mL/m2
MRPISAEROA: 0.13 cm2
MVPG: 10 mmHg
MVPKEVEL: 161 m/s
RV TAPSE: 12.4 mm
Reg peak vel: 321 cm/s
Stroke v: 38 ml
TRMAXVEL: 321 cm/s
VTI: 146 cm
WEIGHTICAEL: 1632 [oz_av]

## 2015-07-07 LAB — BASIC METABOLIC PANEL
Anion gap: 5 (ref 5–15)
BUN: 48 mg/dL — AB (ref 6–20)
CALCIUM: 9.8 mg/dL (ref 8.9–10.3)
CO2: 24 mmol/L (ref 22–32)
CREATININE: 2.96 mg/dL — AB (ref 0.44–1.00)
Chloride: 108 mmol/L (ref 101–111)
GFR calc Af Amer: 16 mL/min — ABNORMAL LOW (ref >60)
GFR, EST NON AFRICAN AMERICAN: 14 mL/min — AB (ref >60)
GLUCOSE: 94 mg/dL (ref 65–99)
POTASSIUM: 4.3 mmol/L (ref 3.5–5.1)
SODIUM: 137 mmol/L (ref 135–145)

## 2015-07-07 LAB — CBC
HCT: 28.8 % — ABNORMAL LOW (ref 36.0–46.0)
Hemoglobin: 9.5 g/dL — ABNORMAL LOW (ref 12.0–15.0)
MCH: 33.8 pg (ref 26.0–34.0)
MCHC: 33 g/dL (ref 30.0–36.0)
MCV: 102.5 fL — ABNORMAL HIGH (ref 78.0–100.0)
Platelets: 280 K/uL (ref 150–400)
RBC: 2.81 MIL/uL — ABNORMAL LOW (ref 3.87–5.11)
RDW: 14.4 % (ref 11.5–15.5)
WBC: 5.2 K/uL (ref 4.0–10.5)

## 2015-07-07 LAB — PROTIME-INR
INR: 1.9 — ABNORMAL HIGH (ref 0.00–1.49)
Prothrombin Time: 21.7 s — ABNORMAL HIGH (ref 11.6–15.2)

## 2015-07-07 MED ORDER — WARFARIN SODIUM 2 MG PO TABS
4.0000 mg | ORAL_TABLET | Freq: Once | ORAL | Status: AC
Start: 2015-07-07 — End: 2015-07-07
  Administered 2015-07-07: 4 mg via ORAL
  Filled 2015-07-07: qty 2

## 2015-07-07 NOTE — Progress Notes (Signed)
  Echocardiogram 2D Echocardiogram has been performed.  [name] Alexander, [name] Alexander 07/07/2015, 3:45 PM

## 2015-07-07 NOTE — Progress Notes (Signed)
CSW spoke with pt at bedside concerning PT recommendation for SNF- pt is refusing SNF and states she will go home with home services when ready  Pt lives with her son but her son works- states some days she has people there while he is at work but not everyday  CSW informed RNCM of pt decision to return home  CSW signing off  [name] LotJenna Alexander, ConnecticutLCSWA Clinical Social Worker [phone]

## 2015-07-07 NOTE — Progress Notes (Signed)
ANTICOAGULATION CONSULT NOTE  Pharmacy Consult for warfarin Indication: atrial fibrillation and hx DVT  No Known Allergies  Patient Measurements: Height: 5\' 4"  (162.6 cm) Weight: 102 lb (46.267 kg) (a scale) IBW/kg (Calculated) : 54.7  Vital Signs: Temp: 98.3 F (36.8 C) (06/26 0640) Temp Source: Oral (06/26 0640) BP: 108/83 mmHg (06/26 0640) Pulse Rate: 105 (06/26 0640)  Labs:  Recent Labs  07/05/15 0910 07/05/15 1325 07/06/15 0305 07/07/15 0640  HGB 10.3*  --   --  9.5*  HCT 30.8*  --   --  28.8*  PLT 292  --   --  280  LABPROT 23.6*  --  23.0* 21.7*  INR 2.12*  --  2.05* 1.90*  CREATININE 2.61*  --  3.04* 2.96*  TROPONINI  --  0.03  --   --     Estimated Creatinine Clearance: 11.1 mL/min (by C-G formula based on Cr of 2.96).   Medical History: Past Medical History  Diagnosis Date  . Thyroid disease   . Hypertension   . Gout   . Pneumonia   . Arthritis   . COPD (chronic obstructive pulmonary disease) (HCC)     Medications:  See electronic med list  Assessment: 82 y/o female who presented to the ED with SOB today. She takes chronic warfarin for Afib and hx DVT. Pharmacy consulted to manage inpatient.  PTA regimen: spoke with family who reports patient is taking 1 mg TID; bottle states to take three 1 mg tabs daily, explained they could give all three tabs at once; last dose 6/23  INR 1.9, slightly subtherapeutic today. CBC stable, no bleeding noted. PO intake 25-100%. Will give a slightly higher dose today x 1 and f/u INR tomorrow morning   Goal of Therapy:  INR 2-3 Monitor platelets by anticoagulation protocol: Yes   Plan:  Warfarin 4 mg PO x1 tonight Monitor daily INR, CBC, clinical course, s/sx of bleed, PO intake, DDI

## 2015-07-07 NOTE — Progress Notes (Signed)
Patient is requesting to go home and is refusing SNF placement at this time; CM talked to patient and she requested that I talk to her son Pattricia BossStevenson POA. VM left with Elisabeth MostStevenson to discuss DCP/HHC choices; awaiting call back; Alexis GoodellB Maddax Palinkas RN,MHA,BSN [phone]

## 2015-07-07 NOTE — Progress Notes (Signed)
Patient ID: [name] Alexander, female   DOB: 01/16/1933, 82 y.o.   MRN: [id number]    PROGRESS NOTE    [name] Alexander  WUJ:[id number] DOB: 01/16/1933 DOA: 07/05/2015  PCP: Laurena SlimmerLARK,[name], MD   Brief Narrative:  82 y.o. female with chronic combined systolic diastolic heart failure, COPD not Alexander home oxygen, former smoker, chronic kidney disease, hypertension, A. fib Alexander Coumadin, presented to the emergency department with main concern of 1-2 days duration of progressively worsening dyspnea present with exertion and occasionally present at rest. Initial work up in ED notable for EKG with A. fib slightly accelerated rate and chest x-ray concerning for CHF.  Assessment & Plan:   Principal Problem:   Acute respiratory distress due to acute Alexander chronic diastolic heart failure, atrial fib with RVR - Please note that patient presented with initial oxygen saturation 88% Alexander room air and respiratory rate as high as 33 BPM - Patient was placed Alexander oxygen via nasal cannula and currently maintaining oxygen saturations above 92% Alexander 2 L [place] - Patient clinically stable this morning, respiratory status stable - Attempt to taper off oxygen today as pt is not Alexander oxygen at home  Active Problems:   Acute Alexander chronic diastolic CHF - Review of records indicate chronic baseline diastolic CHF with no clear evidence of systolic CHF - Last echocardiogram in electronic system is from 2015 which indicates normal EF however, confirms diastolic CHF - Patient is taking Lasix 40 mg by mouth daily at home - She has been started Alexander Lasix 40 mg IV twice a day here but we stopped it yesterady 6/25 as pt looked dry Alexander exam and with Cr going up - Cr is trending down this AM, weight stable at 102 lbs  - Continue to monitor daily weights, strict intake and urine output - Echocardiogram requested for further evaluation, still pending  - No indication for cardiology consultation at this time but will be considered if clinically  indicated - resume lasix in am if Cr improved     Atrial fibrillation with RVR, CHADS2 score 4 - At home patient is Alexander Cardizem and Coumadin - Continue same regimen   Acute Alexander CKD (chronic kidney disease) stage 4, GFR 15-29 ml/min (HCC) - with baseline Cr ~2 in the past 3 months - now up likely from Lasix given in ED - continue to hold lasix today  - reassess in AM with BMP     Normocytic anemia - and anemia of CKD - Hg stable with no signs of acute bleeding  - CBC in AM    Hyperthyroidism - continue Methimazole - TSH 1.606 which is WNL     COPD (chronic obstructive pulmonary disease) (HCC) - respiratory status stable this AM, oxygen stable and > 92% - attempt to taper off oxygen via [place]  DVT prophylaxis: pt Alexander Coumadin  Code Status: Full  Family Communication: Patient at bedside  Disposition Plan: Home in AM, will need HH PT  Consultants:   None  Procedures:   ECHO  Antimicrobials:   None    Subjective: No concerns this AM, no chest pain or dyspnea.   Objective: Filed Vitals:   07/07/15 0549 07/07/15 0640 07/07/15 0913 07/07/15 0940  BP:  108/83    Pulse:  105    Temp:  98.3 F (36.8 C)    TempSrc:  Oral    Resp:  17    Height:      Weight: 46.267 kg (102 lb)  SpO2:  100% 96% 95%    Intake/Output Summary (Last 24 hours) at 07/07/15 1526 Last data filed at 07/07/15 1016  Gross per 24 hour  Intake    650 ml  Output    450 ml  Net    200 ml   Filed Weights   07/06/15 0900 07/07/15 0549  Weight: 46.2 kg (101 lb 13.6 oz) 46.267 kg (102 lb)    Examination:  General exam: Appears calm and comfortable  Respiratory system: Respiratory effort normal. Diminished breath sounds at bases  Cardiovascular system: IRRR. No JVD, rubs, gallops or clicks. No pedal edema. Gastrointestinal system: Abdomen is nondistended, soft and nontender. No organomegaly or masses felt.  Central nervous system: Alert and oriented. No focal neurological  deficits. Extremities: Symmetric 5 x 5 power.  Data Reviewed: I have personally reviewed following labs and imaging studies  CBC:  Recent Labs Lab 07/05/15 0910 07/07/15 0640  WBC 5.2 5.2  NEUTROABS 3.9  --   HGB 10.3* 9.5*  HCT 30.8* 28.8*  MCV 99.4 102.5*  PLT 292 280   Basic Metabolic Panel:  Recent Labs Lab 07/05/15 0910 07/06/15 0305 07/07/15 0640  NA 137 136 137  K 3.8 4.0 4.3  CL 110 108 108  CO2 19* 20* 24  GLUCOSE 107* 92 94  BUN 37* 45* 48*  CREATININE 2.61* 3.04* 2.96*  CALCIUM 10.2 9.6 9.8   Coagulation Profile:  Recent Labs Lab 07/05/15 0910 07/06/15 0305 07/07/15 0640  INR 2.12* 2.05* 1.90*   Cardiac Enzymes:  Recent Labs Lab 07/05/15 1325  TROPONINI 0.03     Recent Labs  07/05/15 1325  TSH 1.606   Urine analysis:    Component Value Date/Time   COLORURINE YELLOW 07/05/2015 0950   APPEARANCEUR CLEAR 07/05/2015 0950   LABSPEC 1.014 07/05/2015 0950   PHURINE 7.0 07/05/2015 0950   GLUCOSEU NEGATIVE 07/05/2015 0950   HGBUR SMALL* 07/05/2015 0950   BILIRUBINUR NEGATIVE 07/05/2015 0950   KETONESUR NEGATIVE 07/05/2015 0950   PROTEINUR 100* 07/05/2015 0950   UROBILINOGEN 0.2 12/12/2013 0414   NITRITE NEGATIVE 07/05/2015 0950   LEUKOCYTESUR NEGATIVE 07/05/2015 0950   Radiology Studies: No results found.  Scheduled Meds: . allopurinol  100 mg Oral q morning - 10a  . diltiazem  120 mg Oral Daily  . feeding supplement (ENSURE ENLIVE)  237 mL Oral BID BM  . fluticasone furoate-vilanterol  1 puff Inhalation Q24H  . methimazole  5 mg Oral q morning - 10a  . pantoprazole  40 mg Oral Daily  . senna  1 tablet Oral Daily  . sodium bicarbonate  650 mg Oral TID  . sodium chloride flush  3 mL Intravenous Q12H  . warfarin  4 mg Oral ONCE-1800  . Warfarin - Pharmacist Dosing Inpatient   Does not apply q1800   Continuous Infusions:   Time spent: 20 minutes   Debbora PrestoMAGICK-Elfrida Pixley, MD [facility] Pager [phone]  If 7PM-7AM,  please contact night-coverage [email or web address] Password [email or web address] 07/07/2015, 3:26 PM

## 2015-07-07 NOTE — Evaluation (Signed)
Physical Therapy Evaluation Patient Details Name: [name] Alexander MRN: [id number] DOB: 1933-02-28 Today's Date: 07/07/2015   History of Present Illness  Pt is a [name] who presented to the ED w/ main concern of worsening dyspnea.  Initial ED work up notable w/ a-fib and CHF.  Pt's PMH includes gout, COPD, TKA.    Clinical Impression  Pt admitted with above diagnosis. Pt currently with functional limitations due to the deficits listed below (see PT Problem List). Pt presents w/ generalized weakness and poor activity tolerance.  She requires min guard assist for transfers and short distance ambulation w/ max verbal cues for pursed lip breathing.  HR up to 120 while ambulating in room.  Pt c/o Lt knee and hip pain 10/10 which is her baseline due to arthritis.  Recommending SNF if unable to arrange for 24/7 assist/supervision. Pt will benefit from skilled PT to increase their independence and safety with mobility to allow discharge to the venue listed below.      Follow Up Recommendations SNF;Supervision/Assistance - 24 hour (Home w/ HHPT only if 24/7 supervision can be arranged)    Equipment Recommendations  None recommended by PT    Recommendations for Other Services OT consult     Precautions / Restrictions Precautions Precautions: Fall Precaution Comments: monitor O2 Restrictions Weight Bearing Restrictions: No      Mobility  Bed Mobility               General bed mobility comments: Pt sitting in recliner chair upon PT arrival  Transfers Overall transfer level: Needs assistance Equipment used: Rolling walker (2 wheeled) Transfers: Sit to/from Stand Sit to Stand: Min guard         General transfer comment: Cues to scoot to edge of seat and push from armrests to achieve standing, pt very slow to stand.  Cues to back up to chair when preparing to sit as pt attempts to sit prematurely.  Ambulation/Gait Ambulation/Gait assistance: Min guard Ambulation Distance (Feet):  40 Feet Assistive device: Rolling walker (2 wheeled) Gait Pattern/deviations: Step-through pattern;Decreased stride length;Trunk flexed   Gait velocity interpretation: Below normal speed for age/gender General Gait Details: HR fluctuating from 90s to 120.  Pt requires max verbal cues for pursed lip breathing, SpO2 remains at or above 91% on RA while ambulating but pt SOB and requires 1 standing rest break.  Stairs            Wheelchair Mobility    Modified Rankin (Stroke Patients Only)       Balance Overall balance assessment: Needs assistance Sitting-balance support: No upper extremity supported;Feet supported Sitting balance-Leahy Scale: Good     Standing balance support: No upper extremity supported;During functional activity Standing balance-Leahy Scale: Fair Standing balance comment: Pt able to pull up pants, undergarments after [facility] without UE support                             Pertinent Vitals/Pain Pain Assessment: 0-10 Pain Score: 10-Worst pain ever Pain Location: Lt knee and hip (h/o arthritis) Pain Descriptors / Indicators: Aching;Grimacing;Moaning Pain Intervention(s): Limited activity within patient's tolerance;Monitored during session;Repositioned;Patient requesting pain meds-RN notified    Home Living Family/patient expects to be discharged to:: Private residence Living Arrangements: Children Available Help at Discharge: Family;Available PRN/intermittently Type of Home: House Home Access: Stairs to enter Entrance Stairs-Rails: Right;Left;Can reach both Entrance Stairs-Number of Steps: 3 Home Layout: One level Home Equipment: Walker - 2  wheels;Shower seat Additional Comments: Aide 9am-11am M-F    Prior Function Level of Independence: Needs assistance   Gait / Transfers Assistance Needed: Ambulates household distances w/ RW   ADL's / Homemaking Assistance Needed: Assist from aide for bathing and dressing.          Hand  Dominance        Extremity/Trunk Assessment   Upper Extremity Assessment: Generalized weakness           Lower Extremity Assessment: Generalized weakness      Cervical / Trunk Assessment: Kyphotic  Communication   Communication: HOH  Cognition Arousal/Alertness: Awake/alert Behavior During Therapy: Anxious Overall Cognitive Status: Within Functional Limits for tasks assessed                      General Comments      Exercises General Exercises - Lower Extremity Ankle Circles/Pumps: AROM;Both;10 reps;Seated      Assessment/Plan    PT Assessment Patient needs continued PT services  PT Diagnosis Difficulty walking;Generalized weakness;Acute pain   PT Problem List Decreased strength;Decreased activity tolerance;Decreased balance;Decreased mobility;Decreased knowledge of use of DME;Decreased safety awareness;Cardiopulmonary status limiting activity;Pain  PT Treatment Interventions DME instruction;Gait training;Stair training;Therapeutic exercise;Therapeutic activities;Functional mobility training;Balance training;Patient/family education;Modalities   PT Goals (Current goals can be found in the Care Plan section) Acute Rehab PT Goals Patient Stated Goal: to go home PT Goal Formulation: With patient Time For Goal Achievement: 07/21/15 Potential to Achieve Goals: Good    Frequency Min 3X/week   Barriers to discharge Inaccessible home environment;Decreased caregiver support Alone for majority of the day and steps to enter home    Co-evaluation               End of Session Equipment Utilized During Treatment: Gait belt Activity Tolerance: Patient limited by fatigue;Patient limited by pain Patient left: in chair;with call bell/phone within reach;with chair alarm set Nurse Communication: Mobility status;Patient requests pain meds    Functional Assessment Tool Used: Clinical Judgement Functional Limitation: Mobility: Walking and moving around Mobility:  Walking and Moving Around Current Status [phone](G8978): At least 1 percent but less than 20 percent impaired, limited or restricted Mobility: Walking and Moving Around Goal Status [phone](G8979): At least 1 percent but less than 20 percent impaired, limited or restricted    Time: 1328-1400 PT Time Calculation (min) (ACUTE ONLY): 32 min   Charges:   PT Evaluation $PT Eval Low Complexity: 1 Procedure PT Treatments $Gait Training: 8-22 mins   PT G Codes:   PT G-Codes **NOT FOR INPATIENT CLASS** Functional Assessment Tool Used: Clinical Judgement Functional Limitation: Mobility: Walking and moving around Mobility: Walking and Moving Around Current Status ([id number](G8978): At least 1 percent but less than 20 percent impaired, limited or restricted Mobility: Walking and Moving Around Goal Status [phone](G8979): At least 1 percent but less than 20 percent impaired, limited or restricted    Encarnacion ChuAshley Liliana Dang PT, DPT  Pager: [phone] Phone: [phone] 07/07/2015, 2:15 PM

## 2015-07-08 DIAGNOSIS — I10 Essential (primary) hypertension: Secondary | ICD-10-CM

## 2015-07-08 DIAGNOSIS — I480 Paroxysmal atrial fibrillation: Secondary | ICD-10-CM | POA: Diagnosis not present

## 2015-07-08 DIAGNOSIS — N184 Chronic kidney disease, stage 4 (severe): Secondary | ICD-10-CM | POA: Diagnosis not present

## 2015-07-08 DIAGNOSIS — R0600 Dyspnea, unspecified: Secondary | ICD-10-CM

## 2015-07-08 DIAGNOSIS — J9620 Acute and chronic respiratory failure, unspecified whether with hypoxia or hypercapnia: Secondary | ICD-10-CM | POA: Diagnosis not present

## 2015-07-08 DIAGNOSIS — J410 Simple chronic bronchitis: Secondary | ICD-10-CM | POA: Diagnosis not present

## 2015-07-08 DIAGNOSIS — I5043 Acute on chronic combined systolic (congestive) and diastolic (congestive) heart failure: Secondary | ICD-10-CM | POA: Diagnosis not present

## 2015-07-08 DIAGNOSIS — J438 Other emphysema: Secondary | ICD-10-CM | POA: Diagnosis not present

## 2015-07-08 DIAGNOSIS — I482 Chronic atrial fibrillation: Secondary | ICD-10-CM | POA: Diagnosis not present

## 2015-07-08 LAB — BASIC METABOLIC PANEL
ANION GAP: 7 (ref 5–15)
BUN: 46 mg/dL — AB (ref 6–20)
CO2: 21 mmol/L — ABNORMAL LOW (ref 22–32)
Calcium: 9.8 mg/dL (ref 8.9–10.3)
Chloride: 109 mmol/L (ref 101–111)
Creatinine, Ser: 2.86 mg/dL — ABNORMAL HIGH (ref 0.44–1.00)
GFR calc Af Amer: 17 mL/min — ABNORMAL LOW (ref >60)
GFR, EST NON AFRICAN AMERICAN: 15 mL/min — AB (ref >60)
Glucose, Bld: 89 mg/dL (ref 65–99)
POTASSIUM: 4.5 mmol/L (ref 3.5–5.1)
SODIUM: 137 mmol/L (ref 135–145)

## 2015-07-08 LAB — CBC
HCT: 26.8 % — ABNORMAL LOW (ref 36.0–46.0)
Hemoglobin: 8.6 g/dL — ABNORMAL LOW (ref 12.0–15.0)
MCH: 32.2 pg (ref 26.0–34.0)
MCHC: 32.1 g/dL (ref 30.0–36.0)
MCV: 100.4 fL — ABNORMAL HIGH (ref 78.0–100.0)
PLATELETS: 283 10*3/uL (ref 150–400)
RBC: 2.67 MIL/uL — AB (ref 3.87–5.11)
RDW: 14.3 % (ref 11.5–15.5)
WBC: 4.8 10*3/uL (ref 4.0–10.5)

## 2015-07-08 LAB — PROTIME-INR
INR: 1.87 — ABNORMAL HIGH (ref 0.00–1.49)
PROTHROMBIN TIME: 21.5 s — AB (ref 11.6–15.2)

## 2015-07-08 MED ORDER — FUROSEMIDE 40 MG PO TABS
40.0000 mg | ORAL_TABLET | Freq: Every day | ORAL | Status: DC
  Administered 2015-07-08: 40 mg via ORAL
  Filled 2015-07-08: qty 1

## 2015-07-08 MED ORDER — METOPROLOL TARTRATE 25 MG PO TABS
25.0000 mg | ORAL_TABLET | Freq: Two times a day (BID) | ORAL | Status: DC
  Administered 2015-07-08 – 2015-07-10 (×4): 25 mg via ORAL
  Filled 2015-07-08 (×4): qty 1

## 2015-07-08 MED ORDER — WARFARIN SODIUM 2 MG PO TABS
4.0000 mg | ORAL_TABLET | Freq: Once | ORAL | Status: AC
Start: 2015-07-08 — End: 2015-07-08
  Administered 2015-07-08: 4 mg via ORAL
  Filled 2015-07-08: qty 2

## 2015-07-08 MED ORDER — FUROSEMIDE 80 MG PO TABS
80.0000 mg | ORAL_TABLET | Freq: Every day | ORAL | Status: DC
  Administered 2015-07-08: 80 mg via ORAL
  Filled 2015-07-08: qty 1

## 2015-07-08 NOTE — Progress Notes (Signed)
Patient ID: [name] Alexander, female   DOB: 12/16/1933, 82 y.o.   MRN: [id number]    PROGRESS NOTE    [name] Alexander  WUJ:[id number] DOB: Jun 17, 1933 DOA: 07/05/2015  PCP: [name], MD   Brief Narrative:  82 y.o. female with chronic combined systolic diastolic heart failure, COPD not on home oxygen, former smoker, chronic kidney disease, hypertension, A. fib on Coumadin, presented to the emergency department with main concern of 1-2 days duration of progressively worsening dyspnea present with exertion and occasionally present at rest. Initial work up in ED notable for EKG with A. fib slightly accelerated rate and chest x-ray concerning for CHF.  Was going to discharge home, night report RN, pt with more dyspnea. I resumed her Lasix dose 40 mg PO QD that pt takes at home. I have reviewed her ECHO and consulted with cardiology to see if they have any further recommendations.   Assessment & Plan:   Principal Problem:   Acute respiratory distress due to acute on chronic diastolic heart failure, atrial fib with RVR - Please note that patient presented with initial oxygen saturation 88% on room air and respiratory rate as high as 33 BPM - Patient was placed on oxygen via nasal cannula and currently maintaining oxygen saturations above 92% on 2 L [place] - Patient clinically stable this morning, respiratory status stable, she denies dyspnea to me this am but says she was more short of breath last night  - we are still unable to taper her off oxygen, she is not on any oxygen at home - since Cr is trending down, I will resume her home lasix 40 mg PO QD  Active Problems:   Acute on chronic diastolic CHF - Review of records indicate chronic baseline diastolic CHF with no clear evidence of systolic CHF - Last echocardiogram in electronic system is from 2015 which indicates normal EF however, confirms diastolic CHF - Patient is taking Lasix 40 mg by mouth daily at home - She has been started on Lasix  40 mg IV twice a day here but we stopped it 6/25 as pt looked dry on exam and with Cr going up - Cr is trending down this AM, weight trend: 101 --> 102 --> 103 lbs  - ECHO on this admission with EF 45%, cardiology was consulted for assistance  - Continue to monitor daily weights, strict intake and urine output    Atrial fibrillation with RVR, CHADS2 score 4 - At home patient is on Cardizem and Coumadin - Continue same regimen   Acute on CKD (chronic kidney disease) stage 4, GFR 15-29 ml/min (HCC) - with baseline Cr ~2 in the past 3 months - Cr trending down  - will resume home dose Lasix 40 mg PO QD    Normocytic anemia - and anemia of CKD - Hg slightly down but no evidence of acute bleeding  - CBC in AM    Hyperthyroidism - continue Methimazole - TSH 1.606 which is WNL     COPD (chronic obstructive pulmonary disease) (HCC) - respiratory status stable this AM, oxygen stable and > 92% - attempt to taper off oxygen via [place]  DVT prophylaxis: pt on Coumadin  Code Status: Full  Family Communication: Patient at bedside  Disposition Plan: Home if cardiology says OK  Consultants:   None  Procedures:   ECHO  Antimicrobials:   None    Subjective: No concerns this AM, no chest pain or dyspnea this AM but had some last night.  Objective: Filed Vitals:   07/07/15 2100 07/08/15 0500 07/08/15 0909 07/08/15 0933  BP: 123/73 125/65  116/71  Pulse: 93 94  102  Temp: 98.4 F (36.9 C) 97.6 F (36.4 C)  98.3 F (36.8 C)  TempSrc: Oral Oral  Oral  Resp: 18 18    Height:      Weight:  46.902 kg (103 lb 6.4 oz)    SpO2: 100% 99% 98% 96%    Intake/Output Summary (Last 24 hours) at 07/08/15 0936 Last data filed at 07/08/15 0846  Gross per 24 hour  Intake    600 ml  Output   1400 ml  Net   -800 ml   Filed Weights   07/06/15 0900 07/07/15 0549 07/08/15 0500  Weight: 46.2 kg (101 lb 13.6 oz) 46.267 kg (102 lb) 46.902 kg (103 lb 6.4 oz)    Examination:  General exam:  Appears calm and comfortable  Respiratory system: Respiratory effort normal. Diminished breath sounds at bases  Cardiovascular system: IRRR. No JVD, rubs, gallops or clicks. Gastrointestinal system: Abdomen is nondistended, soft and nontender. No organomegaly or masses felt.  Central nervous system: Alert and oriented. No focal neurological deficits. Extremities: Symmetric 5 x 5 power.  Data Reviewed: I have personally reviewed following labs and imaging studies  CBC:  Recent Labs Lab 07/05/15 0910 07/07/15 0640 07/08/15 0435  WBC 5.2 5.2 4.8  NEUTROABS 3.9  --   --   HGB 10.3* 9.5* 8.6*  HCT 30.8* 28.8* 26.8*  MCV 99.4 102.5* 100.4*  PLT 292 280 283   Basic Metabolic Panel:  Recent Labs Lab 07/05/15 0910 07/06/15 0305 07/07/15 0640 07/08/15 0435  NA 137 136 137 137  K 3.8 4.0 4.3 4.5  CL 110 108 108 109  CO2 19* 20* 24 21*  GLUCOSE 107* 92 94 89  BUN 37* 45* 48* 46*  CREATININE 2.61* 3.04* 2.96* 2.86*  CALCIUM 10.2 9.6 9.8 9.8   Coagulation Profile:  Recent Labs Lab 07/05/15 0910 07/06/15 0305 07/07/15 0640 07/08/15 0435  INR 2.12* 2.05* 1.90* 1.87*   Cardiac Enzymes:  Recent Labs Lab 07/05/15 1325  TROPONINI 0.03     Recent Labs  07/05/15 1325  TSH 1.606   Urine analysis:    Component Value Date/Time   COLORURINE YELLOW 07/05/2015 0950   APPEARANCEUR CLEAR 07/05/2015 0950   LABSPEC 1.014 07/05/2015 0950   PHURINE 7.0 07/05/2015 0950   GLUCOSEU NEGATIVE 07/05/2015 0950   HGBUR SMALL* 07/05/2015 0950   BILIRUBINUR NEGATIVE 07/05/2015 0950   KETONESUR NEGATIVE 07/05/2015 0950   PROTEINUR 100* 07/05/2015 0950   UROBILINOGEN 0.2 12/12/2013 0414   NITRITE NEGATIVE 07/05/2015 0950   LEUKOCYTESUR NEGATIVE 07/05/2015 0950   Radiology Studies: No results found.  Scheduled Meds: . allopurinol  100 mg Oral q morning - 10a  . diltiazem  120 mg Oral Daily  . feeding supplement (ENSURE ENLIVE)  237 mL Oral BID BM  . fluticasone  furoate-vilanterol  1 puff Inhalation Q24H  . furosemide  40 mg Oral Daily  . methimazole  5 mg Oral q morning - 10a  . pantoprazole  40 mg Oral Daily  . senna  1 tablet Oral Daily  . sodium bicarbonate  650 mg Oral TID  . sodium chloride flush  3 mL Intravenous Q12H  . Warfarin - Pharmacist Dosing Inpatient   Does not apply q1800   Continuous Infusions:   Time spent: 20 minutes   Debbora PrestoMAGICK-Atzin Buchta, MD [facility] Pager [phone]  If 7PM-7AM,  please contact night-coverage [email or web address] Password [email or web address] 07/08/2015, 9:36 AM

## 2015-07-08 NOTE — Progress Notes (Signed)
Received message from [place] with [facility], they are unable to accept the patient's insurance [facility]; TCT patient's son Pattricia BossStevenson POA; [facility], [facility]venson [facility]. Lupita [facility]ashDonna with [facility]; Alexis GoodellB Feras Gardella RN,MHA,BSN [phone]

## 2015-07-08 NOTE — Progress Notes (Signed)
ANTICOAGULATION CONSULT NOTE  Pharmacy Consult for warfarin Indication: atrial fibrillation and hx DVT  No Known Allergies  Patient Measurements: Height: 5\' 4"  (162.6 cm) Weight: 103 lb 6.4 oz (46.902 kg) IBW/kg (Calculated) : 54.7  Vital Signs: Temp: 98.3 F (36.8 C) (06/27 0933) Temp Source: Oral (06/27 0933) BP: 116/71 mmHg (06/27 0933) Pulse Rate: 102 (06/27 0933)  Labs:  Recent Labs  07/05/15 1325 07/06/15 0305 07/07/15 0640 07/08/15 0435  HGB  --   --  9.5* 8.6*  HCT  --   --  28.8* 26.8*  PLT  --   --  280 283  LABPROT  --  23.0* 21.7* 21.5*  INR  --  2.05* 1.90* 1.87*  CREATININE  --  3.04* 2.96* 2.86*  TROPONINI 0.03  --   --   --     Estimated Creatinine Clearance: 11.6 mL/min (by C-G formula based on Cr of 2.86).   Medical History: Past Medical History  Diagnosis Date  . Thyroid disease   . Hypertension   . Gout   . Pneumonia   . Arthritis   . COPD (chronic obstructive pulmonary disease) (HCC)     Medications:  See electronic med list  Assessment: 82 y/o female who presented to the ED with SOB today. She takes chronic warfarin for Afib and hx DVT. Pharmacy consulted to manage inpatient.  PTA regimen: spoke with family who reports patient is taking 1 mg TID; bottle states to take three 1 mg tabs daily, explained they could give all three tabs at once; last dose 6/23  INR 1.87, slightly subtherapeutic again today. Hemoglobin down a bit today, follow closely, no bleeding noted. PO intake 50-100%. Will give a slightly higher dose again today x 1 and f/u INR tomorrow morning   Goal of Therapy:  INR 2-3 Monitor platelets by anticoagulation protocol: Yes   Plan:  Warfarin 4 mg PO x1 tonight Monitor daily INR, CBC, clinical course, s/sx of bleed, PO intake, DDI   Thank you for allowing us to participate in this patients care. Signe Coltonya C Kimball Appleby, PharmD Pager: [phone] 07/08/2015 12:48 PM

## 2015-07-08 NOTE — Care Management Note (Signed)
Case Management Note  Patient Details  Name: [name] BowenLillie B Dorval MRN: [id number] Date of Birth: 04/26/1933  Subjective/Objective:        Admitted with CHF            Action/Plan:  CM talked to patient and she requested that I talk to her son Pattricia BossStevenson POA. Her grandson called Elisabeth MostStevenson in the room for DCP; HHC choice offered, he chose [facility]) Abby with [facility] called for arrangements. Pt has a personal care service Personal Care that assist in her care 2- 4 hrs Monday through Friday. PCP is Dr Margaretmary BayleyPreston Clark; [facility] with [facility] drug coverage; pharmacy of choice [facility]; no problem getting medication; she has a walker at home;   Expected Discharge Date:   possibly 07/08/2015               Expected Discharge Plan:  Home w Home Health Services  Discharge planning Services  CM Consult  Choice offered to:  [facility]  HH Arranged:  RN, Disease Management, PT, OT HH Agency:  [facility]  Status of Service:  In process, will continue to follow  Reola MosherChandler, Demetry Bendickson L, RN,MHA,BSN [phone] 07/08/2015, 10:34 AM
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
## 2015-07-09 DIAGNOSIS — I4891 Unspecified atrial fibrillation: Secondary | ICD-10-CM | POA: Diagnosis not present

## 2015-07-09 DIAGNOSIS — J9620 Acute and chronic respiratory failure, unspecified whether with hypoxia or hypercapnia: Secondary | ICD-10-CM | POA: Diagnosis not present

## 2015-07-09 DIAGNOSIS — I10 Essential (primary) hypertension: Secondary | ICD-10-CM | POA: Diagnosis not present

## 2015-07-09 DIAGNOSIS — I509 Heart failure, unspecified: Secondary | ICD-10-CM

## 2015-07-09 DIAGNOSIS — I4821 Permanent atrial fibrillation: Secondary | ICD-10-CM | POA: Insufficient documentation

## 2015-07-09 DIAGNOSIS — I482 Chronic atrial fibrillation: Secondary | ICD-10-CM | POA: Diagnosis not present

## 2015-07-09 DIAGNOSIS — J410 Simple chronic bronchitis: Secondary | ICD-10-CM

## 2015-07-09 DIAGNOSIS — R0600 Dyspnea, unspecified: Secondary | ICD-10-CM | POA: Diagnosis not present

## 2015-07-09 DIAGNOSIS — I5043 Acute on chronic combined systolic (congestive) and diastolic (congestive) heart failure: Secondary | ICD-10-CM | POA: Diagnosis not present

## 2015-07-09 DIAGNOSIS — I340 Nonrheumatic mitral (valve) insufficiency: Secondary | ICD-10-CM

## 2015-07-09 DIAGNOSIS — J438 Other emphysema: Secondary | ICD-10-CM | POA: Diagnosis not present

## 2015-07-09 DIAGNOSIS — I480 Paroxysmal atrial fibrillation: Secondary | ICD-10-CM | POA: Diagnosis not present

## 2015-07-09 LAB — BASIC METABOLIC PANEL
ANION GAP: 10 (ref 5–15)
BUN: 55 mg/dL — AB (ref 6–20)
CALCIUM: 9.8 mg/dL (ref 8.9–10.3)
CO2: 22 mmol/L (ref 22–32)
Chloride: 105 mmol/L (ref 101–111)
Creatinine, Ser: 3.14 mg/dL — ABNORMAL HIGH (ref 0.44–1.00)
GFR calc Af Amer: 15 mL/min — ABNORMAL LOW (ref >60)
GFR calc non Af Amer: 13 mL/min — ABNORMAL LOW (ref >60)
GLUCOSE: 90 mg/dL (ref 65–99)
POTASSIUM: 4.5 mmol/L (ref 3.5–5.1)
Sodium: 137 mmol/L (ref 135–145)

## 2015-07-09 LAB — PROTIME-INR
INR: 2.07 — ABNORMAL HIGH (ref 0.00–1.49)
PROTHROMBIN TIME: 23.2 s — AB (ref 11.6–15.2)

## 2015-07-09 MED ORDER — FUROSEMIDE 40 MG PO TABS
60.0000 mg | ORAL_TABLET | Freq: Every day | ORAL | Status: DC
  Administered 2015-07-09 – 2015-07-10 (×2): 60 mg via ORAL
  Filled 2015-07-09 (×2): qty 1

## 2015-07-09 MED ORDER — WARFARIN SODIUM 3 MG PO TABS
3.0000 mg | ORAL_TABLET | Freq: Once | ORAL | Status: AC
  Administered 2015-07-09: 3 mg via ORAL
  Filled 2015-07-09: qty 1

## 2015-07-09 NOTE — Progress Notes (Deleted)
RT notified RN of pts pain. Pt states she is in severe pain. Pt states she can not do anything until she gets her pain meds.

## 2015-07-09 NOTE — Progress Notes (Signed)
ANTICOAGULATION CONSULT NOTE  Pharmacy Consult for warfarin Indication: atrial fibrillation and hx DVT  No Known Allergies  Patient Measurements: Height: 5\' 4"  (162.6 cm) Weight: 103 lb 4.8 oz (46.857 kg) (Scale A) IBW/kg (Calculated) : 54.7  Vital Signs: Temp: 97.7 F (36.5 C) (06/28 1057) Temp Source: Oral (06/28 1057) BP: 104/67 mmHg (06/28 1057) Pulse Rate: 86 (06/28 1057)  Labs:  Recent Labs  07/07/15 0640 07/08/15 0435 07/09/15 0430  HGB 9.5* 8.6*  --   HCT 28.8* 26.8*  --   PLT 280 283  --   LABPROT 21.7* 21.5* 23.2*  INR 1.90* 1.87* 2.07*  CREATININE 2.96* 2.86* 3.14*    Estimated Creatinine Clearance: 10.6 mL/min (by C-G formula based on Cr of 3.14).   Medical History: Past Medical History  Diagnosis Date  . Thyroid disease   . Hypertension   . Gout   . Pneumonia   . Arthritis   . COPD (chronic obstructive pulmonary disease) (HCC)     Medications:  See electronic med list  Assessment: 82 y/o female who presented to the ED with SOB today. She takes chronic warfarin for Afib and hx DVT. Pharmacy consulted to manage inpatient.  PTA regimen: spoke with family who reports patient is taking 1 mg TID; bottle states to take three 1 mg tabs daily, explained they could give all three tabs at once; last dose 6/23  INR 2.07, just therapeutic today. Hemoglobin down a bit yesterday, follow closely, no bleeding noted. PO intake 45-100%. DDI with PTA med methimazole.  Will give home dose today x 1 and f/u INR tomorrow morning.  Goal of Therapy:  INR 2-3 Monitor platelets by anticoagulation protocol: Yes   Plan:  Warfarin 3 mg PO x1 tonight Monitor daily INR, CBC, clinical course, s/sx of bleed, PO intake, DDI   Thank you for allowing [name] Alexander to participate in this patients care. Signe [name] Alexander [name] Alexander, PharmD Pager: [phone] 07/09/2015 11:43 AM

## 2015-07-09 NOTE — Progress Notes (Addendum)
Patient ID: [name] BowenLillie B Alexander, female   DOB: 1933/07/21, 82 y.o.   MRN: [id number]    PROGRESS NOTE    [name] BowenLillie B Wik  MWU:[id number] DOB: 1933/07/21 DOA: 07/05/2015  PCP: Laurena SlimmerLARK,[name], MD   Brief Narrative:  82 y.o. female with chronic combined systolic diastolic heart failure, COPD not on home oxygen, former smoker, chronic kidney disease, hypertension, A. fib on Coumadin, presented to the emergency department with main concern of 1-2 days duration of progressively worsening dyspnea present with exertion and occasionally present at rest. Initial work up in ED notable for EKG with A. fib slightly accelerated rate and chest x-ray concerning for CHF.  Was going to discharge home, night report RN, pt with more dyspnea. I resumed her Lasix dose 40 mg PO QD that pt takes at home. I have reviewed her ECHO and consulted with cardiology to see if they have any further recommendations.   Assessment & Plan:   Principal Problem:   Acute respiratory distress due to acute on chronic diastolic heart failure, acute systolic CHF, atrial fib with RVR - pt also with severe, eccentric mitral regurgitation due to Rheumatic heart disease - Please note that patient presented with initial oxygen saturation 88% on room air and respiratory rate as high as 33 BPM - Patient was placed on oxygen via nasal cannula and currently maintaining oxygen saturations above 92% on 2 L [place] - pt has reduced systolic function that is likely worse than the 40-45% reported on echo, as it is overestimated in the setting of MR - we are still unable to taper her off oxygen, she is not on any oxygen at home - pt is on Lasix and dose was adjusted to 60 mg PO QD - PCT consulted as well for assistance  - Once her INR has been therapeutic for 3 weeks, per cardiology we could try amiodarone to see if sinus rhythm helps her symptoms  Active Problems:   Acute on chronic diastolic and acute systolic CHF - Review of records indicate chronic  baseline diastolic CHF with no clear evidence of systolic CHF - Last echocardiogram in electronic system is from 2015 which indicates normal EF however, confirms diastolic CHF - She has been started on Lasix 40 mg IV twice a day here but we stopped it 6/25 as pt looked dry on exam and with Cr going up - Cr is still trending up from 2.86 --> 3.14, weight trend: 101 --> 102 --> 103 lbs  - lasix dose adjusted by cardiologist to 60 mg PO QD - Continue to monitor daily weights, strict intake and urine output    Atrial fibrillation with RVR, CHADS2 score 4 - At home patient is on Cardizem and Coumadin - Continue same regimen   Acute on CKD (chronic kidney disease) stage 4, GFR 15-29 ml/min (HCC) - with baseline Cr ~2 in the past 3 months - Cr trending up - continue Lasix as noted above - BMP In AM    Normocytic anemia - and anemia of CKD - Hg slightly down but no evidence of acute bleeding  - CBC in AM    Hyperthyroidism - continue Methimazole - TSH 1.606 which is WNL     COPD (chronic obstructive pulmonary disease) (HCC), no acute exacerbation  - respiratory status stable this AM, oxygen stable and > 92% - pt will likely need home O2 as we are still unable to taper off oxygen     Underweight  - Body mass index is 17.72 kg/(m^2).  DVT prophylaxis: pt on Coumadin  Code Status: Full  Family Communication: Patient at bedside, called pt's son and left message for him to call me back for updates, awaiting call back  Disposition Plan: Home when clear by cardiology team   Consultants:   None  Procedures:   ECHO  Antimicrobials:   None    Subjective: No concerns this AM, no chest pain or dyspnea this AM but had some last night.   Objective: Filed Vitals:   07/08/15 2028 07/09/15 0428 07/09/15 0849 07/09/15 1057  BP: 133/70 126/70  104/67  Pulse: 73 77  86  Temp: 98.3 F (36.8 C) 98.4 F (36.9 C)  97.7 F (36.5 C)  TempSrc: Oral Oral  Oral  Resp: 18 18    Height:       Weight:  46.857 kg (103 lb 4.8 oz)    SpO2: 100% 95% 95% 100%    Intake/Output Summary (Last 24 hours) at 07/09/15 1731 Last data filed at 07/09/15 1359  Gross per 24 hour  Intake    600 ml  Output   1350 ml  Net   -750 ml   Filed Weights   07/07/15 0549 07/08/15 0500 07/09/15 0428  Weight: 46.267 kg (102 lb) 46.902 kg (103 lb 6.4 oz) 46.857 kg (103 lb 4.8 oz)    Examination:  General exam: Appears calm and comfortable  Respiratory system: Respiratory effort normal. Diminished breath sounds at bases  Cardiovascular system: IRRR. No JVD, rubs, gallops or clicks. Gastrointestinal system: Abdomen is nondistended, soft and nontender. No organomegaly or masses felt.  Central nervous system: Alert and oriented. No focal neurological deficits. Extremities: Symmetric 5 x 5 power.  Data Reviewed: I have personally reviewed following labs and imaging studies  CBC:  Recent Labs Lab 07/05/15 0910 07/07/15 0640 07/08/15 0435  WBC 5.2 5.2 4.8  NEUTROABS 3.9  --   --   HGB 10.3* 9.5* 8.6*  HCT 30.8* 28.8* 26.8*  MCV 99.4 102.5* 100.4*  PLT 292 280 283   Basic Metabolic Panel:  Recent Labs Lab 07/05/15 0910 07/06/15 0305 07/07/15 0640 07/08/15 0435 07/09/15 0430  NA 137 136 137 137 137  K 3.8 4.0 4.3 4.5 4.5  CL 110 108 108 109 105  CO2 19* 20* 24 21* 22  GLUCOSE 107* 92 94 89 90  BUN 37* 45* 48* 46* 55*  CREATININE 2.61* 3.04* 2.96* 2.86* 3.14*  CALCIUM 10.2 9.6 9.8 9.8 9.8   Coagulation Profile:  Recent Labs Lab 07/05/15 0910 07/06/15 0305 07/07/15 0640 07/08/15 0435 07/09/15 0430  INR 2.12* 2.05* 1.90* 1.87* 2.07*   Cardiac Enzymes:  Recent Labs Lab 07/05/15 1325  TROPONINI 0.03   Urine analysis:    Component Value Date/Time   COLORURINE YELLOW 07/05/2015 0950   APPEARANCEUR CLEAR 07/05/2015 0950   LABSPEC 1.014 07/05/2015 0950   PHURINE 7.0 07/05/2015 0950   GLUCOSEU NEGATIVE 07/05/2015 0950   HGBUR SMALL* 07/05/2015 0950   BILIRUBINUR  NEGATIVE 07/05/2015 0950   KETONESUR NEGATIVE 07/05/2015 0950   PROTEINUR 100* 07/05/2015 0950   UROBILINOGEN 0.2 12/12/2013 0414   NITRITE NEGATIVE 07/05/2015 0950   LEUKOCYTESUR NEGATIVE 07/05/2015 0950   Radiology Studies: No results found.  Scheduled Meds: . allopurinol  100 mg Oral q morning - 10a  . feeding supplement (ENSURE ENLIVE)  237 mL Oral BID BM  . fluticasone furoate-vilanterol  1 puff Inhalation Q24H  . furosemide  60 mg Oral Daily  . methimazole  5 mg Oral q morning -  10a  . metoprolol tartrate  25 mg Oral BID  . pantoprazole  40 mg Oral Daily  . senna  1 tablet Oral Daily  . sodium bicarbonate  650 mg Oral TID  . sodium chloride flush  3 mL Intravenous Q12H  . warfarin  3 mg Oral ONCE-1800  . Warfarin - Pharmacist Dosing Inpatient   Does not apply q1800   Continuous Infusions:   Time spent: 20 minutes   Debbora PrestoMAGICK-Gracyn Allor, MD [facility] Pager [phone]  If 7PM-7AM, please contact night-coverage [email or web address] Password [email or web address] 07/09/2015, 5:31 PM

## 2015-07-09 NOTE — Consult Note (Signed)
   [facility] CM Inpatient Consult   07/09/2015  [name] [birth year] [id number]  Referral received to assess for care management services.    Met with the patient and her personal care service aide, Thayer Headings,  regarding the benefits of [name] [facility] services.  Patient states she has home care coming in and they also draw her labs.  Explained that [place] [facility] is a covered benefit of her [facility] insurance. Review information for [name] Care Management and a brochure was provided with contact information.  Explained that [place] [facility] does not interfere with or replace any services arranged by the inpatient care management staff.  Patient states she would like her son, [name] to look over the information.  Thayer Headings, states that the patient's son works 12 hours and that the patient has care:  Mon-T 9 am to 11 am; Wed 9 am to 1145 am; Th - Fr 9 am to 12 noon.  She states that the patient has been seeking more personal care hours since she is at home after 11--12 noon. Patient states she does not weigh because she doesn't have a scale. Her aide states she will likely not answer the telephone when no one is there.  Explained about the 24 hour nurse advise line.  Patient and aide states that they will contact the patient's son regarding the services.  For questions or further referrals, please contact:  Natividad Brood, RN BSN [facility] Liaison  [phone] business mobile phone Toll free office [phone]

## 2015-07-09 NOTE — Progress Notes (Signed)
PATIENT ID: [name] is an [age]F with chronic systolic and diastolic heart failure, CKD IV, hypertension and PAF here with acute on chronic systolic dysfunction and worsened LVEF and likely severe, eccentric mitral regurgitation.  SUBJECTIVE:  Feels well until she tries to walk.  Then she gets short of breath.     PHYSICAL EXAM Filed Vitals:   07/08/15 0933 07/08/15 2028 07/09/15 0428 07/09/15 0849  BP: 116/71 133/70 126/70   Pulse: 102 73 77   Temp: 98.3 F (36.8 C) 98.3 F (36.8 C) 98.4 F (36.9 C)   TempSrc: Oral Oral Oral   Resp:  18 18   Height:      Weight:   103 lb 4.8 oz (46.857 kg)   SpO2: 96% 100% 95% 95%   General:  Chronically ill-appearing, frail, elderly woman.  No acute distress. Neck: No JVD Lungs:  Diminished at bilateral bases.  Heart:  Irregularly irregular.  II/VI holosystolic murmur at apex.  No r/g. Abdomen:  Soft, NT, ND.  +BS Extremities:  WWP.  Trace edema.   LABS: Lab Results  Component Value Date   TROPONINI 0.03 07/05/2015   Results for orders placed or performed during the hospital encounter of 07/05/15 (from the past 24 hour(s))  Protime-INR     Status: Abnormal   Collection Time: 07/09/15  4:30 AM  Result Value Ref Range   Prothrombin Time 23.2 (H) 11.6 - 15.2 seconds   INR 2.07 (H) 0.00 - 1.49  Basic metabolic panel     Status: Abnormal   Collection Time: 07/09/15  4:30 AM  Result Value Ref Range   Sodium 137 135 - 145 mmol/L   Potassium 4.5 3.5 - 5.1 mmol/L   Chloride 105 101 - 111 mmol/L   CO2 22 22 - 32 mmol/L   Glucose, Bld 90 65 - 99 mg/dL   BUN 55 (H) 6 - 20 mg/dL   Creatinine, Ser 4.093.14 (H) 0.44 - 1.00 mg/dL   Calcium 9.8 8.9 - 81.110.3 mg/dL   GFR calc non Af Amer 13 (L) >60 mL/min   GFR calc Af Amer 15 (L) >60 mL/min   Anion gap 10 5 - 15    Intake/Output Summary (Last 24 hours) at 07/09/15 1040 Last data filed at 07/09/15 0828  Gross per 24 hour  Intake    720 ml  Output   1650 ml  Net   -930 ml    Telemetry:  atrial fibrillation.  PVCs.   ASSESSMENT AND PLAN:  Principal Problem:   Acute on chronic combined systolic and diastolic heart failure (HCC) Active Problems:   Hypertension   Hyperthyroidism   A-fib (HCC)   CKD (chronic kidney disease) stage 4, GFR 15-29 ml/min (HCC)   Normocytic anemia   CKD (chronic kidney disease), stage IV (HCC)   COPD (chronic obstructive pulmonary disease) (HCC)   # Acute on chronic systolic and diastolic heart failure: # Persistent atrial fibrillation: # Moderate-severe MR: # Stage 4 CKD:  [name] has likely severe, eccentric mitral regurgitation due to Rheumatic heart disease.  She also has reduced systolic function that is likely worse than the 40-45% reported on echo, as it is overestimated in the setting of MR.  This is compounded by her atrial fibrillation and acute on chronic renal disease.  We had a discussion about the fact that none of these are easily treatable with medications.  She is not interested in undergoing TEE, DCCV, mitral valve repair or replacement.  Her valve is  likely too calcified to be repaired.  Her volume status is challenging, as keeping her euvolemic with lasix is worsening her renal function.  We will reduce lasix to 60 mg daily.  The goal is net even.  We discussed 2g sodium and 2L fluid restrictions.  I will ask Palliative Care to see her as well.  Once her INR has been therapeutic for 3 weeks, we could try amiodarone to see if sinus rhythm helps her symptoms.    Aleesha Ringstad C. Duke Salviaandolph, MD, [facility] 07/09/2015 10:40 AM

## 2015-07-10 DIAGNOSIS — Z515 Encounter for palliative care: Secondary | ICD-10-CM | POA: Diagnosis not present

## 2015-07-10 DIAGNOSIS — R0600 Dyspnea, unspecified: Secondary | ICD-10-CM | POA: Diagnosis not present

## 2015-07-10 DIAGNOSIS — J9620 Acute and chronic respiratory failure, unspecified whether with hypoxia or hypercapnia: Secondary | ICD-10-CM | POA: Diagnosis not present

## 2015-07-10 DIAGNOSIS — I5043 Acute on chronic combined systolic (congestive) and diastolic (congestive) heart failure: Secondary | ICD-10-CM | POA: Diagnosis not present

## 2015-07-10 DIAGNOSIS — I480 Paroxysmal atrial fibrillation: Secondary | ICD-10-CM | POA: Diagnosis not present

## 2015-07-10 DIAGNOSIS — N184 Chronic kidney disease, stage 4 (severe): Secondary | ICD-10-CM | POA: Diagnosis not present

## 2015-07-10 DIAGNOSIS — J449 Chronic obstructive pulmonary disease, unspecified: Secondary | ICD-10-CM | POA: Diagnosis not present

## 2015-07-10 DIAGNOSIS — Z7189 Other specified counseling: Secondary | ICD-10-CM

## 2015-07-10 DIAGNOSIS — I10 Essential (primary) hypertension: Secondary | ICD-10-CM | POA: Diagnosis not present

## 2015-07-10 DIAGNOSIS — J410 Simple chronic bronchitis: Secondary | ICD-10-CM | POA: Diagnosis not present

## 2015-07-10 DIAGNOSIS — R269 Unspecified abnormalities of gait and mobility: Secondary | ICD-10-CM | POA: Diagnosis not present

## 2015-07-10 LAB — CBC
HEMATOCRIT: 29.2 % — AB (ref 36.0–46.0)
HEMOGLOBIN: 9.5 g/dL — AB (ref 12.0–15.0)
MCH: 33.6 pg (ref 26.0–34.0)
MCHC: 32.5 g/dL (ref 30.0–36.0)
MCV: 103.2 fL — ABNORMAL HIGH (ref 78.0–100.0)
Platelets: 303 10*3/uL (ref 150–400)
RBC: 2.83 MIL/uL — AB (ref 3.87–5.11)
RDW: 14.2 % (ref 11.5–15.5)
WBC: 4.7 10*3/uL (ref 4.0–10.5)

## 2015-07-10 LAB — BASIC METABOLIC PANEL
Anion gap: 6 (ref 5–15)
BUN: 57 mg/dL — AB (ref 6–20)
CHLORIDE: 106 mmol/L (ref 101–111)
CO2: 26 mmol/L (ref 22–32)
CREATININE: 3.09 mg/dL — AB (ref 0.44–1.00)
Calcium: 9.9 mg/dL (ref 8.9–10.3)
GFR calc Af Amer: 15 mL/min — ABNORMAL LOW (ref >60)
GFR calc non Af Amer: 13 mL/min — ABNORMAL LOW (ref >60)
Glucose, Bld: 84 mg/dL (ref 65–99)
POTASSIUM: 4.7 mmol/L (ref 3.5–5.1)
SODIUM: 138 mmol/L (ref 135–145)

## 2015-07-10 LAB — PROTIME-INR
INR: 2.03 — AB (ref 0.00–1.49)
PROTHROMBIN TIME: 22.8 s — AB (ref 11.6–15.2)

## 2015-07-10 MED ORDER — FUROSEMIDE 20 MG PO TABS: 60.0000 mg | ORAL_TABLET | Freq: Every day | ORAL | Status: DC

## 2015-07-10 MED ORDER — SENNA 8.6 MG PO TABS: 1.0000 | ORAL_TABLET | Freq: Every day | ORAL | Status: AC

## 2015-07-10 MED ORDER — WARFARIN SODIUM 2 MG PO TABS: 4.0000 mg | ORAL_TABLET | Freq: Once | ORAL | Status: DC

## 2015-07-10 MED ORDER — METOPROLOL TARTRATE 25 MG PO TABS: 25.0000 mg | ORAL_TABLET | Freq: Two times a day (BID) | ORAL | Status: DC

## 2015-07-10 MED ORDER — ALLOPURINOL 100 MG PO TABS: 100.0000 mg | ORAL_TABLET | Freq: Every morning | ORAL | Status: AC

## 2015-07-10 MED ORDER — WARFARIN SODIUM 3 MG PO TABS: 3.0000 mg | ORAL_TABLET | Freq: Every day | ORAL | Status: DC

## 2015-07-10 MED ORDER — PANTOPRAZOLE SODIUM 40 MG PO TBEC
40.0000 mg | DELAYED_RELEASE_TABLET | Freq: Every day | ORAL | Status: AC
Start: 2015-07-10 — End: ?

## 2015-07-10 MED ORDER — OXYCODONE HCL 5 MG PO TABS: 2.5000 mg | ORAL_TABLET | Freq: Two times a day (BID) | ORAL | Status: DC | PRN

## 2015-07-10 NOTE — Progress Notes (Signed)
Physical Therapy Treatment Patient Details Name: [name] BowenLillie B Santerre MRN: [id number] DOB: 1933-06-13 Today's Date: 07/10/2015    History of Present Illness Pt is a [name] who presented to the ED w/ main concern of worsening dyspnea.  Initial ED work up notable w/ a-fib and CHF.  Pt's PMH includes gout, COPD, TKA.    PT Comments    Due to pt's poor safety awareness she required mod assist for stair training and very close min guard for short distance ambulation.  Pt continues to refuse SNF saying, "I will get around fine at home, I'm just SOB right now".  Pt w/ very poor safety awarenes and is a high fall risk.  Therefore, continue to recommend SNF as most appropriate d/c plan.  However, if pt continues to refuse SNF then [facility] as pt needs 24/7 supervision/assist.   Follow Up Recommendations  SNF;Supervision/Assistance - 24 hour (Home w/ HHPT only if 24/7 supervision can be arranged)     Equipment Recommendations  None recommended by PT    Recommendations for Other Services OT consult     Precautions / Restrictions Precautions Precautions: Fall Precaution Comments: monitor O2 Restrictions Weight Bearing Restrictions: No    Mobility  Bed Mobility Overal bed mobility: Needs Assistance Bed Mobility: Supine to Sit     Supine to sit: Min guard     General bed mobility comments: Increased time and effort w/ use of bed rail.  Pt c/o Lt hip pain.  Transfers Overall transfer level: Needs assistance Equipment used: Rolling walker (2 wheeled) Transfers: Sit to/from [employer] CorporationStand;Stand Pivot Transfers Sit to Stand: Min guard Stand pivot transfers: Min guard       General transfer comment: Cues to scoot to edge of seat and push from armrests to achieve standing, pt very slow to stand.  Cues to back up to chair when preparing to sit as pt attempts to sit prematurely.  Ambulation/Gait Ambulation/Gait assistance: Min guard Ambulation Distance (Feet): 25  Feet Assistive device: Rolling walker (2 wheeled) Gait Pattern/deviations: Decreased stride length;Trunk flexed   Gait velocity interpretation: Below normal speed for age/gender General Gait Details: HR up to 106.  Cues for pursed lip breathing using teachback, pt inconsistently performing correctly. Poor pulse ox reading but when reading pt is at or above 89% on 2L O2.  Cues for upright posture.   Stairs Stairs: Yes Stairs assistance: Mod assist Stair Management: One rail Right;Step to pattern;Sideways;Forwards Number of Stairs: 2 General stair comments: Pt requires max verbal and tactile cues for technique but pt still not following commands consistently for safe technique.  Pt begins ascending steps w/ RW in front of her.  Once on the first step pt still does not realize that the RW is in her way and requires max verbal and tactile cues and assist to move RW out from in front of her.  Pt holding on to Bil rails to ascend and one rail on the Rt to descend.  Reviewed technique again verbally once pt resting in chair.   Wheelchair Mobility    Modified Rankin (Stroke Patients Only)       Balance Overall balance assessment: Needs assistance Sitting-balance support: No upper extremity supported;Feet supported Sitting balance-Leahy Scale: Good     Standing balance support: Bilateral upper extremity supported;During functional activity Standing balance-Leahy Scale: Poor Standing balance comment: Relies on UE support                    Cognition  Arousal/Alertness: Awake/alert Behavior During Therapy: Anxious Overall Cognitive Status: Impaired/Different from baseline Area of Impairment: Following commands;Safety/judgement;Problem solving       Following Commands: Follows one step commands with increased time;Follows one step commands inconsistently Safety/Judgement: Decreased awareness of safety;Decreased awareness of deficits   Problem Solving: Slow processing;Requires  verbal cues;Requires tactile cues General Comments: Pt w/ very poor safety awareness, requires max verbal and tactile cues for safe technique w/ all mobility.    Exercises General Exercises - Lower Extremity Ankle Circles/Pumps: AROM;Both;10 reps;Seated Straight Leg Raises: AAROM;Both;5 reps;Seated    General Comments General comments (skin integrity, edema, etc.): Pt continues to refuse SNF saying, "I will get around fine at home, I'm just SOB right now".  Pt w/ very poor safety awarenes and is a high fall risk.  Therefore, continue to recommend SNF as most appropriate d/c plan.  However, if pt continues to refuse SNF the [facility] as pt needs 24/7 supervision/assist.      Pertinent Vitals/Pain Pain Assessment: Faces Faces Pain Scale: Hurts even more Pain Location: Lt knee and hip (h/o arthritis) Pain Descriptors / Indicators: Aching;Grimacing Pain Intervention(s): Limited activity within patient's tolerance;Monitored during session;Patient requesting pain meds-RN notified    Home Living                      Prior Function            PT Goals (current goals can now be found in the care plan section) Acute Rehab PT Goals Patient Stated Goal: to go home PT Goal Formulation: With patient Time For Goal Achievement: 07/21/15 Potential to Achieve Goals: Fair Progress towards PT goals: Not progressing toward goals - comment (due to pt's poor safety awareness)    Frequency  Min 3X/week    PT Plan Current plan remains appropriate    Co-evaluation             End of Session Equipment Utilized During Treatment: Gait belt Activity Tolerance: Patient limited by fatigue;Patient limited by pain Patient left: in chair;with call bell/phone within reach;with chair alarm set     Time: [id number] PT Time Calculation (min) (ACUTE ONLY): 35 min  Charges:  $Gait Training: 23-37 mins                    G Codes:       Encarnacion ChuAshley Abashian PT, DPT   Pager: [phone] Phone: [phone] 07/10/2015, 10:35 AM

## 2015-07-10 NOTE — Discharge Instructions (Signed)
Patient will be discharged to home.  Patient will need to follow up with primary care provider within one week of discharge and with cardiology on 7/7.  Patient [facility].  Patient should continue medications as prescribed.  Patient should follow a heart healthy, low salt diet.

## 2015-07-10 NOTE — Progress Notes (Addendum)
ANTICOAGULATION CONSULT NOTE  Pharmacy Consult for warfarin Indication: atrial fibrillation and hx DVT  No Known Allergies  Patient Measurements: Height: 5\' 4"  (162.6 cm) Weight: 100 lb 14.4 oz (45.768 kg) (Scale A) IBW/kg (Calculated) : 54.7  Vital Signs: Temp: 98.3 F (36.8 C) (06/29 1144) Temp Source: Oral (06/29 1144) BP: 119/77 mmHg (06/29 1144) Pulse Rate: 94 (06/29 1144)  Labs:  Recent Labs  07/08/15 0435 07/09/15 0430 07/10/15 0457 07/10/15 0739  HGB 8.6*  --  9.5*  --   HCT 26.8*  --  29.2*  --   PLT 283  --  303  --   LABPROT 21.5* 23.2* 22.8*  --   INR 1.87* 2.07* 2.03*  --   CREATININE 2.86* 3.14*  --  3.09*    Estimated Creatinine Clearance: 10.5 mL/min (by C-G formula based on Cr of 3.09).   Medical History: Past Medical History  Diagnosis Date  . Thyroid disease   . Hypertension   . Gout   . Pneumonia   . Arthritis   . COPD (chronic obstructive pulmonary disease) (HCC)     Medications:  See electronic med list  Assessment: 82 y/o female who presented to the ED with SOB today. She takes chronic warfarin for Afib and hx DVT. Pharmacy consulted to manage inpatient.  PTA regimen: spoke with family who reports patient is taking 1 mg TID; bottle states to take three 1 mg tabs daily, explained they could give all three tabs at once; last dose 6/23  INR 2.03, therapeutic today. Hemoglobin stable, no bleeding noted. PO intake down a little to 25-50%, being supplemented with Ensure Enlive and this does contain some vitamin K, could be contributing to need for slightly higher warfarin doses.  DDI with PTA med methimazole.  Will give 4 mg x 1 and f/u INR tomorrow morning.  Goal of Therapy:  INR 2-3 Monitor platelets by anticoagulation protocol: Yes   Plan:  Warfarin 4 mg PO x 1 tonight Monitor daily INR, CBC, clinical course, s/sx of bleed, PO intake, DDI   Thank you for allowing us to participate in this patients care. Signe Coltonya C Rithik Odea,  PharmD Pager: [phone] 07/10/2015 11:53 AM

## 2015-07-10 NOTE — Progress Notes (Signed)
SATURATION QUALIFICATIONS: (This note is used to comply with regulatory documentation for home oxygen)  Patient Saturations on Room Air at Rest = 97%  Patient Saturations on Room Air while Ambulating = 83%  Patient Saturations on 2 Liters of oxygen while Ambulating = 100%  Please briefly explain why patient needs home oxygen: Pt. De-Sats down to mid to low 80's and sustains while ambulating with walker on Room Air.

## 2015-07-10 NOTE — Consult Note (Signed)
Consultation Note Date: 07/10/2015   Patient Name: [name] Alexander  DOB: Feb 16, 1933  MRN: [id number]  Age / Sex: 82 y.o., female  PCP: Foye Spurling, MD Referring Physician: Karmen Bongo, MD  Reason for Consultation: Establishing goals of care  HPI/Patient Profile: 82 y.o. female  with past medical history of mixed heart failure, COPD, CKD stage 4 who was admitted on 07/05/2015 with acute on chronic respiratory failure.  She was diuresed and treated with maximal medical therapy.  She now requires oxygen supplementation.  She is able to ambulate short distances.  She is not a candidate for hemodialysis.  Clinical Assessment and Goals of Care: [name] Ormond, PA-S and I met with Ms.  [name] Alexander and her family at bedside.  The pathophysiology of her chronic co morbidities (CHF, CKD, valvular regurgitation, SOB) was explained.  We then introduced an advanced directive document.  This was left with the family for them to fill out at their convenience.  At this point the patient chooses to remain a full code.  "Hard Choices" booklet was given to [name] Alexander ([name] Alexander's son).    We discussed her on-going SOB at night.  Low dose oxycodone 2.5 mg prn SOB was recommended and prescribed.    Palliative medicine will follow [name] outpatient.  NEXT OF KIN:  Son, [name] Alexander assists [name] Alexander in making medical decisions.    SUMMARY OF RECOMMENDATIONS   Full code Advanced directives paper work was given to the family. Palliative Medicine will follow outpatient Started trial of oxycodone 2.5 mg PRN for night time dyspnea.  Additional Recommendations (Limitations, Scope, Preferences):  Full Scope Treatment  Psycho-social/Spiritual:   Desire for further Chaplaincy support:no  Additional Recommendations: Caregiving  Support/Resources  Prognosis:   < 12 months given diastolic and systolic heart failure, CKD with  GFR of 15, and COPD.  Discharge Planning: Home with Palliative Services      Primary Diagnoses: Present on Admission:  . COPD (chronic obstructive pulmonary disease) ([place]) . CKD (chronic kidney disease), stage IV ([place]) . CKD (chronic kidney disease) stage 4, GFR 15-29 ml/min (HCC) . A-fib ([place]) . Hyperthyroidism . Hypertension . Acute on chronic combined systolic and diastolic heart failure ([place]) . Normocytic anemia  I have reviewed the medical record, interviewed the patient and family, and examined the patient. The following aspects are pertinent.  Past Medical History  Diagnosis Date  . Thyroid disease   . Hypertension   . Gout   . Pneumonia   . Arthritis   . COPD (chronic obstructive pulmonary disease) ([place])    Social History   Social History  . Marital Status: Widowed    Spouse Name: N/A  . Number of Children: N/A  . Years of Education: N/A   Social History Main Topics  . Smoking status: Former Smoker -- 0.25 packs/day for 20 years    Types: Cigarettes    Quit date: 03/12/2015  . Smokeless tobacco: Never Used  . Alcohol Use: No  . Drug Use: No  . Sexual Activity: Yes  Birth Control/ Protection: Other-see comments   Other Topics Concern  . None   Social History Narrative   Family History  Problem Relation Age of Onset  . Stroke Neg Hx   . CAD Neg Hx    Scheduled Meds: . allopurinol  100 mg Oral q morning - 10a  . feeding supplement (ENSURE ENLIVE)  237 mL Oral BID BM  . fluticasone furoate-vilanterol  1 puff Inhalation Q24H  . furosemide  60 mg Oral Daily  . methimazole  5 mg Oral q morning - 10a  . metoprolol tartrate  25 mg Oral BID  . pantoprazole  40 mg Oral Daily  . senna  1 tablet Oral Daily  . sodium bicarbonate  650 mg Oral TID  . sodium chloride flush  3 mL Intravenous Q12H  . warfarin  4 mg Oral ONCE-1800  . Warfarin - Pharmacist Dosing Inpatient   Does not apply q1800   Continuous Infusions:  PRN Meds:.sodium chloride,  acetaminophen, albuterol, ondansetron (ZOFRAN) IV, polyethylene glycol, sodium chloride flush, traMADol No Known Allergies Review of Systems Patient with SOB and fatigue.  Denies CP, Dysphagia, changes in bowel habits or dysuria  Physical Exam  Very pleasant, elderly frail female, sitting up in a recliner chair CV Reg Rate Resp NAD with N/C in place Abdomen Soft, +BS Extremities:  No edema, able to move all 4.  Able to ambulate in the hall with a walker.  Vital Signs: BP 119/77 mmHg  Pulse 90  Temp(Src) 98.3 F (36.8 C) (Oral)  Resp 18  Ht '5\' 4"'$  (1.626 m)  Wt 45.768 kg (100 lb 14.4 oz)  BMI 17.31 kg/m2  SpO2 97% Pain Assessment: 0-10   Pain Score: 9    SpO2: SpO2: 97 % O2 Device:SpO2: 97 % O2 Flow Rate: .O2 Flow Rate (L/min): 2 L/min  IO: Intake/output summary:  Intake/Output Summary (Last 24 hours) at 07/10/15 1346 Last data filed at 07/10/15 0848  Gross per 24 hour  Intake    420 ml  Output    850 ml  Net   -430 ml    LBM: Last BM Date: 07/09/15 Baseline Weight: Weight: 46.2 kg (101 lb 13.6 oz) (scale A) Most recent weight: Weight: 45.768 kg (100 lb 14.4 oz) (Scale A)     Palliative Assessment/Data:   Flowsheet Rows        Most Recent Value   Intake Tab    Referral Department  Hospitalist   Unit at Time of Referral  Med/Surg Unit   Palliative Care Primary Diagnosis  Cardiac   Date Notified  07/09/15   Palliative Care Type  New Palliative care   Reason for referral  Clarify Goals of Care   Date of Admission  07/05/15   Date first seen by Palliative Care  07/10/15   # of days Palliative referral response time  1 Day(s)   # of days IP prior to Palliative referral  4   Clinical Assessment    Palliative Performance Scale Score  50%   Dyspnea Max Last 24 Hours  5   Dyspnea Min Last 24 hours  2   Psychosocial & Spiritual Assessment    Palliative Care Outcomes    Patient/Family meeting held?  Yes   Who was at the meeting?  patient, son, cousin, cousin's wife     Palliative Care Outcomes  Improved non-pain symptom therapy      Time In: 2:00 Time Out: 2:50 Time Total: 50 min. Greater than 50%  of  this time was spent counseling and coordinating care related to the above assessment and plan.  Signed by:  [name] Burn, PA-C Palliative Medicine Pager: [phone]   Please contact Palliative Medicine Team phone at [phone] for questions and concerns.  For individual provider: See Shea Evans

## 2015-07-10 NOTE — Progress Notes (Signed)
Patient remains Observational Status DCP- home with [facility] and personal care service; pt son and daughter alternates turns caring for her at home Ref[facility] for [facility] care services. Abelino DerrickB Arlee Santosuosso [facility] PartnershipRN,MHA,BSN [phone]

## 2015-07-10 NOTE — Progress Notes (Signed)
Orders received for pt discharge.  Discharge summary printed and reviewed with pt.  Explained medication regimen, and pt had no further questions at this time.  IV removed and site remains clean, dry, intact.  Telemetry removed.  Pt in stable condition and awaiting transport. 

## 2015-07-10 NOTE — Progress Notes (Signed)
PATIENT ID: [name] is an [age]F with chronic systolic and diastolic heart failure, CKD IV, hypertension and PAF here with acute on chronic systolic dysfunction and worsened LVEF and likely severe, eccentric mitral regurgitation.  SUBJECTIVE:  Feeling better   PHYSICAL EXAM Filed Vitals:   07/09/15 1057 07/09/15 2047 07/10/15 0456 07/10/15 0851  BP: 104/67 128/76 137/72   Pulse: 86 92 89   Temp: 97.7 F (36.5 C) 97.7 F (36.5 C) 98 F (36.7 C)   TempSrc: Oral Oral Oral   Resp:  18 18   Height:      Weight:   100 lb 14.4 oz (45.768 kg)   SpO2: 100% 100% 100% 99%   General:  Chronically ill-appearing, frail, elderly woman.  No acute distress. Neck: No JVD Lungs:  Diminished at bilateral bases.  Heart:  Irregularly irregular.  II/VI holosystolic murmur at apex.  No r/g. Abdomen:  Soft, NT, ND.  +BS Extremities:  WWP.  Trace edema.   LABS: Lab Results  Component Value Date   TROPONINI 0.03 07/05/2015   Results for orders placed or performed during the hospital encounter of 07/05/15 (from the past 24 hour(s))  Protime-INR     Status: Abnormal   Collection Time: 07/10/15  4:57 AM  Result Value Ref Range   Prothrombin Time 22.8 (H) 11.6 - 15.2 seconds   INR 2.03 (H) 0.00 - 1.49  CBC     Status: Abnormal   Collection Time: 07/10/15  4:57 AM  Result Value Ref Range   WBC 4.7 4.0 - 10.5 K/uL   RBC 2.83 (L) 3.87 - 5.11 MIL/uL   Hemoglobin 9.5 (L) 12.0 - 15.0 g/dL   HCT 96.029.2 (L) 45.436.0 - 09.846.0 %   MCV 103.2 (H) 78.0 - 100.0 fL   MCH 33.6 26.0 - 34.0 pg   MCHC 32.5 30.0 - 36.0 g/dL   RDW 11.914.2 14.711.5 - 82.915.5 %   Platelets 303 150 - 400 K/uL  Basic metabolic panel     Status: Abnormal   Collection Time: 07/10/15  7:39 AM  Result Value Ref Range   Sodium 138 135 - 145 mmol/L   Potassium 4.7 3.5 - 5.1 mmol/L   Chloride 106 101 - 111 mmol/L   CO2 26 22 - 32 mmol/L   Glucose, Bld 84 65 - 99 mg/dL   BUN 57 (H) 6 - 20 mg/dL   Creatinine, Ser 5.623.09 (H) 0.44 - 1.00 mg/dL   Calcium  9.9 8.9 - 13.010.3 mg/dL   GFR calc non Af Amer 13 (L) >60 mL/min   GFR calc Af Amer 15 (L) >60 mL/min   Anion gap 6 5 - 15    Intake/Output Summary (Last 24 hours) at 07/10/15 1134 Last data filed at 07/10/15 0848  Gross per 24 hour  Intake    420 ml  Output    850 ml  Net   -430 ml    Telemetry: atrial fibrillation.  PVCs.   ASSESSMENT AND PLAN:  Principal Problem:   Acute on chronic combined systolic and diastolic heart failure (HCC) Active Problems:   Hypertension   Hyperthyroidism   A-fib (HCC)   CKD (chronic kidney disease) stage 4, GFR 15-29 ml/min (HCC)   Normocytic anemia   CKD (chronic kidney disease), stage IV (HCC)   COPD (chronic obstructive pulmonary disease) (HCC)   Acute on chronic congestive heart failure (HCC)   Atrial fibrillation (HCC)   # Acute on chronic systolic and diastolic heart failure: # Persistent  atrial fibrillation: # Moderate-severe MR: # Stage 4 CKD:  [name] has likely severe, eccentric mitral regurgitation due to Rheumatic heart disease.  She also has reduced systolic function that is likely worse than the 40-45% reported on echo, as it is overestimated in the setting of MR.  This is compounded by her atrial fibrillation and acute on chronic renal disease.  She has opted for medical management.  Renal function remains stable on PO lasix and she had a net negative fluid balance yesterday. Stable for discharge from a cardiology perspective.  Palliative care was consulted yesterday.  Suspect they will see her today.  We will arrange for outpatient follow up.  Consider antiarrhythmic once INR has been therapeutic at least 3 weeks. She will see Theodore Demarkhonda Barrett on 7/7.   Costas Sena C. Duke Salviaandolph, MD, [facility] 07/10/2015 11:34 AM

## 2015-07-10 NOTE — Discharge Summary (Signed)
Physician Discharge Summary  [name] BowenLillie B Alexander JYN:[id number] DOB: February 23, 1933 DOA: 07/05/2015  PCP: Laurena SlimmerLARK,[name], MD  Admit date: 07/05/2015 Discharge date: 07/10/2015  Time spent: 35 minutes  Recommendations for Outpatient Follow-up:  Patient will be discharged to home.  Patient will need to follow up with primary care provider within one week of discharge and with cardiology on 7/7.  Patient will be [facility] for [facility] care services.  Patient should continue medications as prescribed.  Patient should follow a heart healthy, low salt diet.    Discharge Diagnoses:  Principal Problem:   Acute on chronic combined systolic and diastolic heart failure (HCC) Active Problems:   Hypertension   Hyperthyroidism   A-fib (HCC)   CKD (chronic kidney disease) stage 4, GFR 15-29 ml/min (HCC)   Normocytic anemia   CKD (chronic kidney disease), stage IV (HCC)   COPD (chronic obstructive pulmonary disease) (HCC)   Acute on chronic congestive heart failure (HCC)   Atrial fibrillation (HCC)   Discharge Condition: improved  Diet recommendation: health healthy, low salt  Filed Weights   07/08/15 0500 07/09/15 0428 07/10/15 0456  Weight: 46.902 kg (103 lb 6.4 oz) 46.857 kg (103 lb 4.8 oz) 45.768 kg (100 lb 14.4 oz)    History of present illness:  Per Dr. Izola PriceMyers - 82 y.o. female with chronic combined systolic diastolic heart failure, COPD not on home oxygen, former smoker, chronic kidney disease, hypertension, A. fib on Coumadin, presented to the emergency department with main concern of 1-2 days duration of progressively worsening dyspnea present with exertion and occasionally present at rest. Initial work up in ED notable for EKG with A. fib slightly accelerated rate and chest x-ray concerning for CHF.  Was going to discharge home, night report RN, pt with more dyspnea. I resumed her Lasix dose 40 mg PO QD that pt takes at home. I have reviewed her ECHO and consulted with  cardiology to see if they have any further recommendations.  Hospital Course:  Principal Problem:  Acute respiratory distress due to acute on chronic diastolic heart failure, acute systolic CHF, atrial fib with RVR - pt also with severe, eccentric mitral regurgitation due to Rheumatic heart disease - Please note that patient presented with initial oxygen saturation 88% on room air and respiratory rate as high as 33 BPM - Patient was placed on oxygen via nasal cannula and currently maintaining oxygen saturations above 92% on 2 L [place] - pt has reduced systolic function that is likely worse than the 40-45% reported on echo, as it is overestimated in the setting of MR and is compounded by her atrial fibrillation and acute on chronic rebal disease - we are still unable to taper her off oxygen, she is not on any oxygen at home; she may need to be arranged to have home oxygen - pt is on Lasix and dose was adjusted to 60 mg PO QD - PCT consulted as well for assistance as an outpatient - Once her INR has been therapeutic for 3 weeks, per cardiology we could try amiodarone to see if sinus rhythm helps her symptoms  Active Problems:  Acute on chronic diastolic and acute systolic CHF - Review of records indicate chronic baseline diastolic CHF with no clear evidence of systolic CHF - Last echocardiogram in electronic system is from 2015 which indicates normal EF however, confirms diastolic CHF - She has been started on Lasix 40 mg IV twice a day here but we stopped it 6/25  as pt looked dry on exam and with Cr going up - Cr is still trending up from 2.86 --> 3.14, weight trend: 101 --> 102 --> 103 lbs --> 104 lbs - lasix dose adjusted by cardiologist to 60 mg PO QD - Patient to monitor daily weights at home   Atrial fibrillation with RVR, CHADS2 score 4 - At home patient is on Cardizem and Coumadin - Continue same regimen  Acute on CKD (chronic kidney disease) stage 4, GFR 15-29 ml/min (HCC) - with  baseline Cr ~2 in the past 3 months - Cr trending up - continue Lasix as noted above   Normocytic anemia - and anemia of CKD - Hgb stable   Hyperthyroidism - continue Methimazole - TSH 1.606 which is WNL    COPD (chronic obstructive pulmonary disease) (HCC), no acute exacerbation  - respiratory status stable this AM, oxygen stable and > 92% - pt will likely need home O2 as we are still unable to taper off oxygen    Underweight  - Body mass index is 17.72 kg/(m^2).  Procedures:  None  Consultations:  Cardiology  Discharge Exam: Filed Vitals:   07/10/15 0456 07/10/15 1144  BP: 137/72 119/77  Pulse: 89 94  Temp: 98 F (36.7 C) 98.3 F (36.8 C)  Resp: 18 18     General: Well developed, well nourished, NAD, appears stated age  HEENT: NCAT, PERRLA, EOMI, Anicteic Sclera, mucous membranes moist.  Neck: Supple, no JVD, no masses  Cardiovascular: S1 S2 auscultated, no rubs, murmurs or gallops. Irregularly iregular rate and rhythm.  Respiratory: Clear to auscultation bilaterally with equal chest rise  Abdomen: Soft, nontender, nondistended, + bowel sounds  Extremities: warm dry without cyanosis clubbing or edema  Neuro: AAOx3, cranial nerves grossly intact. Strength 5/5 in patient's upper and lower extremities bilaterally  Skin: Without rashes exudates or nodules  Psych: Normal affect and demeanor with intact judgement and insight  Discharge Instructions     Medication List    ASK your doctor about these medications        acetaminophen 500 MG tablet  Commonly known as:  TYLENOL  Take 500 mg by mouth every 6 (six) hours as needed for mild pain.     albuterol 108 (90 Base) MCG/ACT inhaler  Commonly known as:  PROVENTIL HFA;VENTOLIN HFA  Inhale 2 puffs into the lungs every 6 (six) hours as needed for wheezing or shortness of breath.     BREO ELLIPTA 100-25 MCG/INH Aepb  Generic drug:  fluticasone furoate-vilanterol  Inhale 1 puff into the lungs  daily.     diltiazem 120 MG 12 hr capsule  Commonly known as:  CARDIZEM SR  Take 1 capsule (120 mg total) by mouth 2 (two) times daily.     feeding supplement (ENSURE COMPLETE) Liqd  Take 237 mLs by mouth 2 (two) times daily between meals.     furosemide 40 MG tablet  Commonly known as:  LASIX  Take 40 mg by mouth daily.     methimazole 5 MG tablet  Commonly known as:  TAPAZOLE  Take 5 mg by mouth every morning.     polyethylene glycol packet  Commonly known as:  MIRALAX / GLYCOLAX  Take 17 g by mouth daily.     sodium bicarbonate 650 MG tablet  Take 1 tablet (650 mg total) by mouth 3 (three) times daily.     traMADol 50 MG tablet  Commonly known as:  ULTRAM  Take 50-100 mg by mouth every  6 (six) hours as needed for moderate pain.     warfarin 2 MG tablet  Commonly known as:  COUMADIN  Take 1 tablet (2 mg total) by mouth daily at 6 PM.       No Known Allergies     Follow-up Information    Follow up with [facility].   Why:  They will do your home health care at your home   Contact information:   [address] [place] [address] [phone]        The results of significant diagnostics from this hospitalization (including imaging, microbiology, ancillary and laboratory) are listed below for reference.    Significant Diagnostic Studies: Dg Chest 2 View  07/05/2015  CLINICAL DATA:  Shortness of breath EXAM: CHEST  2 VIEW COMPARISON:  06/25/2015 chest radiograph. FINDINGS: Stable cardiomediastinal silhouette with mild cardiomegaly. No pneumothorax. No pleural effusion. Mild pulmonary edema. IMPRESSION: Mild congestive heart failure. Electronically Signed   By: Delbert Phenix M.D.   On: 07/05/2015 10:01   Dg Chest 2 View  06/25/2015  CLINICAL DATA:  Shortness of breath for 4 weeks. EXAM: CHEST  2 VIEW COMPARISON:  04/29/2015 FINDINGS: Mild hyperinflation. Heart size is slightly enlarged but stable. Atherosclerotic calcifications involving  the thoracic aorta. Lung markings are slightly coarse and suggestive for chronic changes. There is no focal airspace disease or pulmonary edema. No acute bone abnormality. No large pleural effusions. IMPRESSION: No active cardiopulmonary disease. Mild enlargement of the cardiac silhouette is unchanged. Electronically Signed   By: Richarda Overlie M.D.   On: 06/25/2015 16:56    Microbiology: No results found for this or any previous visit (from the past 240 hour(s)).   Labs: Basic Metabolic Panel:  Recent Labs Lab 07/06/15 0305 07/07/15 0640 07/08/15 0435 07/09/15 0430 07/10/15 0739  NA 136 137 137 137 138  K 4.0 4.3 4.5 4.5 4.7  CL 108 108 109 105 106  CO2 20* 24 21* 22 26  GLUCOSE 92 94 89 90 84  BUN 45* 48* 46* 55* 57*  CREATININE 3.04* 2.96* 2.86* 3.14* 3.09*  CALCIUM 9.6 9.8 9.8 9.8 9.9   Liver Function Tests: No results for input(s): AST, ALT, ALKPHOS, BILITOT, PROT, ALBUMIN in the last 168 hours. No results for input(s): LIPASE, AMYLASE in the last 168 hours. No results for input(s): AMMONIA in the last 168 hours. CBC:  Recent Labs Lab 07/05/15 0910 07/07/15 0640 07/08/15 0435 07/10/15 0457  WBC 5.2 5.2 4.8 4.7  NEUTROABS 3.9  --   --   --   HGB 10.3* 9.5* 8.6* 9.5*  HCT 30.8* 28.8* 26.8* 29.2*  MCV 99.4 102.5* 100.4* 103.2*  PLT 292 280 283 303   Cardiac Enzymes:  Recent Labs Lab 07/05/15 1325  TROPONINI 0.03   BNP: BNP (last 3 results)  Recent Labs  04/29/15 1025 06/25/15 1308 07/05/15 0910  BNP 975.7* 644.0* 719.1*    ProBNP (last 3 results) No results for input(s): PROBNP in the last 8760 hours.  CBG: No results for input(s): GLUCAP in the last 168 hours.     Signed:  Jonah Blue  [facility] 07/10/2015, 12:21 PM

## 2015-07-11 DIAGNOSIS — Z7189 Other specified counseling: Secondary | ICD-10-CM | POA: Insufficient documentation

## 2015-07-11 DIAGNOSIS — Z515 Encounter for palliative care: Secondary | ICD-10-CM | POA: Insufficient documentation

## 2015-07-16 DIAGNOSIS — I5030 Unspecified diastolic (congestive) heart failure: Secondary | ICD-10-CM | POA: Diagnosis not present

## 2015-07-16 DIAGNOSIS — I82403 Acute embolism and thrombosis of unspecified deep veins of lower extremity, bilateral: Secondary | ICD-10-CM | POA: Diagnosis not present

## 2015-07-16 DIAGNOSIS — Z7901 Long term (current) use of anticoagulants: Secondary | ICD-10-CM | POA: Diagnosis not present

## 2015-07-16 DIAGNOSIS — J441 Chronic obstructive pulmonary disease with (acute) exacerbation: Secondary | ICD-10-CM | POA: Diagnosis not present

## 2015-07-16 DIAGNOSIS — F17210 Nicotine dependence, cigarettes, uncomplicated: Secondary | ICD-10-CM | POA: Diagnosis not present

## 2015-07-16 DIAGNOSIS — Z7952 Long term (current) use of systemic steroids: Secondary | ICD-10-CM | POA: Diagnosis not present

## 2015-07-16 DIAGNOSIS — I130 Hypertensive heart and chronic kidney disease with heart failure and stage 1 through stage 4 chronic kidney disease, or unspecified chronic kidney disease: Secondary | ICD-10-CM | POA: Diagnosis not present

## 2015-07-16 DIAGNOSIS — E039 Hypothyroidism, unspecified: Secondary | ICD-10-CM | POA: Diagnosis not present

## 2015-07-16 DIAGNOSIS — E43 Unspecified severe protein-calorie malnutrition: Secondary | ICD-10-CM | POA: Diagnosis not present

## 2015-07-16 DIAGNOSIS — Z5181 Encounter for therapeutic drug level monitoring: Secondary | ICD-10-CM | POA: Diagnosis not present

## 2015-07-16 DIAGNOSIS — N184 Chronic kidney disease, stage 4 (severe): Secondary | ICD-10-CM | POA: Diagnosis not present

## 2015-07-17 DIAGNOSIS — J449 Chronic obstructive pulmonary disease, unspecified: Secondary | ICD-10-CM | POA: Diagnosis not present

## 2015-07-17 DIAGNOSIS — I10 Essential (primary) hypertension: Secondary | ICD-10-CM | POA: Diagnosis not present

## 2015-07-17 DIAGNOSIS — I825Y9 Chronic embolism and thrombosis of unspecified deep veins of unspecified proximal lower extremity: Secondary | ICD-10-CM | POA: Diagnosis not present

## 2015-07-17 DIAGNOSIS — E049 Nontoxic goiter, unspecified: Secondary | ICD-10-CM | POA: Diagnosis not present

## 2015-07-18 ENCOUNTER — Telehealth: Payer: Self-pay | Admitting: *Deleted

## 2015-07-18 ENCOUNTER — Ambulatory Visit (INDEPENDENT_AMBULATORY_CARE_PROVIDER_SITE_OTHER): Payer: Medicare Other | Admitting: Physician Assistant

## 2015-07-18 ENCOUNTER — Encounter: Payer: Self-pay | Admitting: Physician Assistant

## 2015-07-18 VITALS — BP 112/58 | HR 64 | Ht 64.0 in | Wt 104.0 lb

## 2015-07-18 DIAGNOSIS — I5030 Unspecified diastolic (congestive) heart failure: Secondary | ICD-10-CM | POA: Diagnosis not present

## 2015-07-18 DIAGNOSIS — Z7952 Long term (current) use of systemic steroids: Secondary | ICD-10-CM | POA: Diagnosis not present

## 2015-07-18 DIAGNOSIS — J441 Chronic obstructive pulmonary disease with (acute) exacerbation: Secondary | ICD-10-CM | POA: Diagnosis not present

## 2015-07-18 DIAGNOSIS — E43 Unspecified severe protein-calorie malnutrition: Secondary | ICD-10-CM | POA: Diagnosis not present

## 2015-07-18 DIAGNOSIS — E039 Hypothyroidism, unspecified: Secondary | ICD-10-CM | POA: Diagnosis not present

## 2015-07-18 DIAGNOSIS — I5043 Acute on chronic combined systolic (congestive) and diastolic (congestive) heart failure: Secondary | ICD-10-CM | POA: Diagnosis not present

## 2015-07-18 DIAGNOSIS — I82403 Acute embolism and thrombosis of unspecified deep veins of lower extremity, bilateral: Secondary | ICD-10-CM | POA: Diagnosis not present

## 2015-07-18 DIAGNOSIS — N184 Chronic kidney disease, stage 4 (severe): Secondary | ICD-10-CM | POA: Diagnosis not present

## 2015-07-18 DIAGNOSIS — Z5181 Encounter for therapeutic drug level monitoring: Secondary | ICD-10-CM | POA: Diagnosis not present

## 2015-07-18 DIAGNOSIS — F17210 Nicotine dependence, cigarettes, uncomplicated: Secondary | ICD-10-CM | POA: Diagnosis not present

## 2015-07-18 DIAGNOSIS — I130 Hypertensive heart and chronic kidney disease with heart failure and stage 1 through stage 4 chronic kidney disease, or unspecified chronic kidney disease: Secondary | ICD-10-CM | POA: Diagnosis not present

## 2015-07-18 DIAGNOSIS — Z7901 Long term (current) use of anticoagulants: Secondary | ICD-10-CM | POA: Diagnosis not present

## 2015-07-18 NOTE — Telephone Encounter (Signed)
LMOVM

## 2015-07-18 NOTE — Progress Notes (Signed)
Cardiology Office Note   Date:  07/18/2015   ID:  [name] BowenLillie B Syfert, DOB [birth year], MRN [id number]  PCP:  Laurena SlimmerLARK,[name], MD  Cardiologist:  Dr Chrisandra Nettersroitoru  Evoleht Hovatter, PA-C   Chief Complaint  Patient presents with  . Hospitalization Follow-up    History of Present Illness: [name] Alexander is a 82 y.o. female with a history of S-D-CHF (EF 50% w/ grade 2 dd by echo 2015), HTN, COPD, CKD IV, DVT  D/c 06/29 after admit for SOB, CHF w/ EF now 45-50% w/ mod MR, PAF w/ CHADS2VASC=6 (HTN, CHF, PAD, female, age x 2), now on O2. Seen by Palliative Care, pt a full code. BUN and creatinine are elevated, but the patient does not want dialysis.  [name] BowenLillie B Kautzman presents for post hospital follow-up  Since discharge from the hospital, she has good days and bad days. She has not been getting weight in the mornings because her daughter-in-law does most of her care and she is at work when the patient gets up. She has an aide in the mornings, and they will try to get the aid to do her daily weights.  She has not been very active. She has been sleeping a lot during the day. She didn't feel well on Monday, one of her bad days. She had no pain or specific complaints. She has had some swelling for about the last 4 days. She is on chronic oxygen therapy, and feels her respiratory status is about at baseline. She is sleeping during the day in a chair, but denies orthopnea or PND.  Her medications are set out by her son, the patient is compliant.She stays cold a lot. Her Coumadin was checked by a home health nurse and the dose adjusted.  Doctors appointments are very hard on her as she is very weak and it is difficult for her to leave the house. It tires her out a great deal.  She wants to drink a lot and complains of thirst. She is eating some ice this set of drinking water but still may be getting more fluid than intended.   Past Medical History  Diagnosis Date  . Thyroid disease   . Hypertension   .  Gout   . Pneumonia   . Arthritis   . COPD (chronic obstructive pulmonary disease) (HCC)   . Chronic diastolic CHF (congestive heart failure), NYHA class 2 (HCC)   . CKD (chronic kidney disease), stage IV [facility](HCC)     Past Surgical History  Procedure Laterality Date  . Abdominal hysterectomy    . Total knee arthroplasty      Current Outpatient Prescriptions  Medication Sig Dispense Refill  . acetaminophen (TYLENOL) 500 MG tablet Take 500 mg by mouth every 6 (six) hours as needed for mild pain.    Marland Kitchen. albuterol (PROVENTIL HFA;VENTOLIN HFA) 108 (90 Base) MCG/ACT inhaler Inhale 2 puffs into the lungs every 6 (six) hours as needed for wheezing or shortness of breath. 1 Inhaler 3  . allopurinol (ZYLOPRIM) 100 MG tablet Take 1 tablet (100 mg total) by mouth every morning. 10 tablet 0  . diltiazem (CARDIZEM SR) 120 MG 12 hr capsule Take 1 capsule (120 mg total) by mouth 2 (two) times daily. 60 capsule 6  . feeding supplement, ENSURE COMPLETE, (ENSURE COMPLETE) LIQD Take 237 mLs by mouth 2 (two) times daily between meals. 60 Bottle 0  . fluticasone furoate-vilanterol (BREO ELLIPTA) 100-25 MCG/INH AEPB Inhale 1 puff into the lungs daily.    .Marland Kitchen  furosemide (LASIX) 20 MG tablet Take 3 tablets (60 mg total) by mouth daily. 30 tablet 0  . furosemide (LASIX) 40 MG tablet     . methimazole (TAPAZOLE) 5 MG tablet Take 5 mg by mouth every morning.     . metoprolol tartrate (LOPRESSOR) 25 MG tablet Take 1 tablet (25 mg total) by mouth 2 (two) times daily. 20 tablet 0  . oxyCODONE (ROXICODONE) 5 MG immediate release tablet Take 0.5 tablets (2.5 mg total) by mouth every 12 (twelve) hours as needed (for shortness of breath if needed). 20 tablet 0  . pantoprazole (PROTONIX) 40 MG tablet Take 1 tablet (40 mg total) by mouth daily. 10 tablet 0  . polyethylene glycol (MIRALAX / GLYCOLAX) packet Take 17 g by mouth daily. (Patient taking differently: Take 17 g by mouth daily as needed for moderate constipation. ) 14 each 0   . senna (SENOKOT) 8.6 MG TABS tablet Take 1 tablet (8.6 mg total) by mouth daily. 120 each 0  . sodium bicarbonate 650 MG tablet Take 1 tablet (650 mg total) by mouth 3 (three) times daily. 90 tablet 0  . traMADol (ULTRAM) 50 MG tablet Take 50-100 mg by mouth every 6 (six) hours as needed for moderate pain.     Marland Kitchen. warfarin (COUMADIN) 3 MG tablet Take 1 tablet (3 mg total) by mouth daily at 6 PM. Take 1 1/2 tablets tonight and then resume 1 tablet (3 mg) once daily; follow-up with your doctor for recheck of your INR next week. 10 tablet 0   No current facility-administered medications for this visit.    Allergies:   Review of patient's allergies indicates no known allergies.    Social History:  The patient  reports that she quit smoking about 4 months ago. Her smoking use included Cigarettes. She has a 5 pack-year smoking history. She has never used smokeless tobacco. She reports that she does not drink alcohol or use illicit drugs.   Family History:  The patient's family history is negative for Stroke and CAD.    ROS:  Please see the history of present illness. All other systems are reviewed and negative.    PHYSICAL EXAM: VS:  BP 112/58 mmHg  Pulse 64  Ht 5\' 4"  (1.626 m)  Wt 104 lb (47.174 kg)  BMI 17.84 kg/m2 , BMI Body mass index is 17.84 kg/(m^2). GEN: Well nourished, well developed, female in no acute distress HEENT: normal for age  Neck: JVD 10 cm, no carotid bruit, no masses Cardiac: Irregular rate and rhythm; no murmur, no rubs, or gallops Respiratory:  Decreased breath sounds bases bilaterally, normal work of breathing GI: soft, nontender, nondistended, + BS MS: no deformity or atrophy; 1+ edema; distal pulses are 2+ in all 4 extremities  Skin: warm and dry, no rash Neuro:  Strength and sensation are intact, but the patient is extremely weak Psych: euthymic mood, full affect   EKG:  EKG is not ordered today.   Recent Labs: 03/20/2015: ALT 18 07/05/2015: B Natriuretic  Peptide 719.1*; TSH 1.606 07/10/2015: BUN 57*; Creatinine, Ser 3.09*; Hemoglobin 9.5*; Platelets 303; Potassium 4.7; Sodium 138    Lipid Panel No results found for: CHOL, TRIG, HDL, CHOLHDL, VLDL, LDLCALC, LDLDIRECT   Wt Readings from Last 3 Encounters:  07/18/15 104 lb (47.174 kg)  07/10/15 100 lb 14.4 oz (45.768 kg)  05/16/15 102 lb 8 oz (46.494 kg)     Other studies Reviewed: Additional studies/ records that were reviewed today include: Office notes, hospital  records and testing.  ASSESSMENT AND PLAN:  1.  Acute on Chronic combined systolic and diastolic CHF: The patient's weight is up today, but the exact amount is hard to say as she has eaten 2 meals and is also wearing several layers of clothing. However, her neck veins are up and she has lower extremity edema. I will increase her Lasix from 60 mg up to 80 mg for 3 days only. Her potassium was 4.7 a week ago. Her creatinine was at baseline at 3.09. The family is to start daily weights and let us know how she does.  Will get the home health RN to check a BMET in 10 days, fax [place] the results. The home health RN can also check her weights and call us if there is a significant increase.  Encourage fluid restrictions 1200 mL and a low sodium diet.  2. CK D stage IV: We will follow her BMET with diuresis, trying not to give her extra Lasix very often.   Current medicines are reviewed at length with the patient today.  The patient does not have concerns regarding medicines.  The following changes have been made:  Increase Lasix temporarily  Labs/ tests ordered today include: No orders of the defined types were placed in this encounter.     Disposition:   FU with Dr. Royann Shivers  Signed, Theodore Demark, PA-C  07/18/2015 2:12 PM    [facility] Phone: [phone]; Fax: [phone]  This note was written with the assistance of speech recognition software. Please excuse any transcriptional errors.

## 2015-07-18 NOTE — Patient Instructions (Addendum)
Medication Instructions:   TAKE LASIX 80 MG ONCE A DAY FOR 3 DAYS ONLY THEN RESUME BACK TO NORMAL DOSE    If you need a refill on your cardiac medications before your next appointment, please call your pharmacy.  Labwork: NONE ORDER TODAY    Testing/Procedures: NONE ORDER TODAY    Follow-Up:  IN 3 MONTH WITH DR CROITORU     Any Other Special Instructions Will Be Listed Below (If Applicable).  PLEASE WEIGH IN THE AM  DAILY AFTER GETTING UP FROM REST AND AFTER USING BATHROOM  CONTACT OFFICE BACK IF WEIGHT GAIN OF 3 LBS IN 24 HOURS OR  5 LBS IN A WEEK

## 2015-07-21 DIAGNOSIS — I5030 Unspecified diastolic (congestive) heart failure: Secondary | ICD-10-CM | POA: Diagnosis not present

## 2015-07-21 DIAGNOSIS — Z5181 Encounter for therapeutic drug level monitoring: Secondary | ICD-10-CM | POA: Diagnosis not present

## 2015-07-21 DIAGNOSIS — E43 Unspecified severe protein-calorie malnutrition: Secondary | ICD-10-CM | POA: Diagnosis not present

## 2015-07-21 DIAGNOSIS — I130 Hypertensive heart and chronic kidney disease with heart failure and stage 1 through stage 4 chronic kidney disease, or unspecified chronic kidney disease: Secondary | ICD-10-CM | POA: Diagnosis not present

## 2015-07-21 DIAGNOSIS — Z7952 Long term (current) use of systemic steroids: Secondary | ICD-10-CM | POA: Diagnosis not present

## 2015-07-21 DIAGNOSIS — J441 Chronic obstructive pulmonary disease with (acute) exacerbation: Secondary | ICD-10-CM | POA: Diagnosis not present

## 2015-07-21 DIAGNOSIS — E039 Hypothyroidism, unspecified: Secondary | ICD-10-CM | POA: Diagnosis not present

## 2015-07-21 DIAGNOSIS — Z7901 Long term (current) use of anticoagulants: Secondary | ICD-10-CM | POA: Diagnosis not present

## 2015-07-21 DIAGNOSIS — I82403 Acute embolism and thrombosis of unspecified deep veins of lower extremity, bilateral: Secondary | ICD-10-CM | POA: Diagnosis not present

## 2015-07-21 DIAGNOSIS — F17210 Nicotine dependence, cigarettes, uncomplicated: Secondary | ICD-10-CM | POA: Diagnosis not present

## 2015-07-21 DIAGNOSIS — N184 Chronic kidney disease, stage 4 (severe): Secondary | ICD-10-CM | POA: Diagnosis not present

## 2015-07-21 NOTE — Telephone Encounter (Signed)
ERROR

## 2015-07-23 ENCOUNTER — Encounter (HOSPITAL_COMMUNITY): Payer: Self-pay | Admitting: *Deleted

## 2015-07-23 ENCOUNTER — Telehealth: Payer: Self-pay | Admitting: Cardiovascular Disease

## 2015-07-23 DIAGNOSIS — J449 Chronic obstructive pulmonary disease, unspecified: Secondary | ICD-10-CM | POA: Insufficient documentation

## 2015-07-23 DIAGNOSIS — Z87891 Personal history of nicotine dependence: Secondary | ICD-10-CM | POA: Diagnosis not present

## 2015-07-23 DIAGNOSIS — I509 Heart failure, unspecified: Secondary | ICD-10-CM | POA: Insufficient documentation

## 2015-07-23 DIAGNOSIS — R2242 Localized swelling, mass and lump, left lower limb: Secondary | ICD-10-CM | POA: Diagnosis not present

## 2015-07-23 DIAGNOSIS — M25552 Pain in left hip: Secondary | ICD-10-CM | POA: Diagnosis not present

## 2015-07-23 DIAGNOSIS — N184 Chronic kidney disease, stage 4 (severe): Secondary | ICD-10-CM | POA: Insufficient documentation

## 2015-07-23 DIAGNOSIS — Y9301 Activity, walking, marching and hiking: Secondary | ICD-10-CM | POA: Insufficient documentation

## 2015-07-23 DIAGNOSIS — Z7901 Long term (current) use of anticoagulants: Secondary | ICD-10-CM | POA: Insufficient documentation

## 2015-07-23 DIAGNOSIS — W1830XA Fall on same level, unspecified, initial encounter: Secondary | ICD-10-CM | POA: Insufficient documentation

## 2015-07-23 DIAGNOSIS — Y999 Unspecified external cause status: Secondary | ICD-10-CM | POA: Insufficient documentation

## 2015-07-23 DIAGNOSIS — S99922A Unspecified injury of left foot, initial encounter: Secondary | ICD-10-CM | POA: Diagnosis not present

## 2015-07-23 DIAGNOSIS — M1612 Unilateral primary osteoarthritis, left hip: Secondary | ICD-10-CM | POA: Diagnosis not present

## 2015-07-23 DIAGNOSIS — S79912A Unspecified injury of left hip, initial encounter: Secondary | ICD-10-CM | POA: Diagnosis not present

## 2015-07-23 DIAGNOSIS — I130 Hypertensive heart and chronic kidney disease with heart failure and stage 1 through stage 4 chronic kidney disease, or unspecified chronic kidney disease: Secondary | ICD-10-CM | POA: Diagnosis not present

## 2015-07-23 DIAGNOSIS — Z96659 Presence of unspecified artificial knee joint: Secondary | ICD-10-CM | POA: Diagnosis not present

## 2015-07-23 DIAGNOSIS — Y9289 Other specified places as the place of occurrence of the external cause: Secondary | ICD-10-CM | POA: Insufficient documentation

## 2015-07-23 DIAGNOSIS — N289 Disorder of kidney and ureter, unspecified: Secondary | ICD-10-CM

## 2015-07-23 DIAGNOSIS — M25562 Pain in left knee: Secondary | ICD-10-CM | POA: Diagnosis not present

## 2015-07-23 DIAGNOSIS — Z79899 Other long term (current) drug therapy: Secondary | ICD-10-CM

## 2015-07-23 NOTE — Telephone Encounter (Signed)
New message  Pt son states he received a call about scheduling an appt for lab work. i did not see a telephone message or order for lab. Please call back to advise

## 2015-07-23 NOTE — Telephone Encounter (Signed)
Spoke w/son. Explained that R. Barrett, PA wanted patient to have a BMET drawn. Son will call back w/home care agency info so that they can draw labs for patient. He states they are going to see patient on Friday 7/14.

## 2015-07-23 NOTE — ED Notes (Signed)
The pt fell this am at 0600am  When her wheelchair collapsed going to the br  She ia c/o lt knee pain lt hip pain and all the toes on her lt foot except for the great toe  She is on blolod thinners

## 2015-07-24 ENCOUNTER — Emergency Department (HOSPITAL_COMMUNITY): Payer: Medicare Other

## 2015-07-24 ENCOUNTER — Emergency Department (HOSPITAL_COMMUNITY)
Admission: EM | Admit: 2015-07-24 | Discharge: 2015-07-24 | Disposition: A | Discharge status: Home / Self Care | Payer: Medicare Other | Attending: Emergency Medicine | Admitting: Emergency Medicine

## 2015-07-24 DIAGNOSIS — M1612 Unilateral primary osteoarthritis, left hip: Secondary | ICD-10-CM

## 2015-07-24 DIAGNOSIS — S79912A Unspecified injury of left hip, initial encounter: Secondary | ICD-10-CM | POA: Diagnosis not present

## 2015-07-24 DIAGNOSIS — S99922A Unspecified injury of left foot, initial encounter: Secondary | ICD-10-CM | POA: Diagnosis not present

## 2015-07-24 DIAGNOSIS — W19XXXA Unspecified fall, initial encounter: Secondary | ICD-10-CM

## 2015-07-24 DIAGNOSIS — M25562 Pain in left knee: Secondary | ICD-10-CM | POA: Diagnosis not present

## 2015-07-24 DIAGNOSIS — M25552 Pain in left hip: Secondary | ICD-10-CM | POA: Diagnosis not present

## 2015-07-24 MED ORDER — TRAMADOL HCL 50 MG PO TABS
100.0000 mg | ORAL_TABLET | Freq: Once | ORAL | Status: AC
  Administered 2015-07-24: 100 mg via ORAL
  Filled 2015-07-24: qty 2

## 2015-07-24 NOTE — Telephone Encounter (Signed)
Will fax as requested

## 2015-07-24 NOTE — Telephone Encounter (Signed)
Follow up      Need order for lab work for [facility].  They will be seeing pt in am.  Please fax order to [phone].

## 2015-07-24 NOTE — ED Notes (Signed)
Xray notified pt is ready for transport

## 2015-07-24 NOTE — ED Provider Notes (Signed)
CSN: [id number]     Arrival date & time 07/23/15  1939 History  By signing my name below, I, [name] Alexander, attest that this documentation has been prepared under the direction and in the presence of Pricilla Loveless, MD . Electronically Signed: Freida Alexander, Scribe. 07/24/2015. 2:26 AM.  Chief Complaint  Patient presents with  . Fall   The history is provided by the patient. No language interpreter was used.    HPI Comments:  [name] Alexander is a 82 y.o. female with a history of arthritis, who presents to the Emergency Department s/p fall ~ 0600 this AM complaining of left sided pain following the incident. She reports pain to the left knee, left hip, and all the toes of the left foot except the great toe. Pt was on her way to the bathroom when her walker turned over and she fell. She denies head injury and LOC. Pt has been able to ambulate but with increased pain. She has taken tramadol with mild relief; her last dose was ~ 1600. She also denies HA, CP, back pain, abdominal pain, and SOB. Pt is currently on coumadin.   Past Medical History  Diagnosis Date  . Thyroid disease   . Hypertension   . Gout   . Pneumonia   . Arthritis   . COPD (chronic obstructive pulmonary disease) (HCC)   . Chronic diastolic CHF (congestive heart failure), NYHA class 2 (HCC)   . CKD (chronic kidney disease), stage IV [facility])    Past Surgical History  Procedure Laterality Date  . Abdominal hysterectomy    . Total knee arthroplasty     Family History  Problem Relation Age of Onset  . Stroke Neg Hx   . CAD Neg Hx    Social History  Substance Use Topics  . Smoking status: Former Smoker -- 0.25 packs/day for 20 years    Types: Cigarettes    Quit date: 03/12/2015  . Smokeless tobacco: Never Used  . Alcohol Use: No   OB History    No data available     Review of Systems  Respiratory: Negative for shortness of breath.   Cardiovascular: Negative for chest pain.  Musculoskeletal: Positive for myalgias  and arthralgias. Negative for back pain.  Neurological: Negative for syncope and headaches.   Allergies  Review of patient's allergies indicates no known allergies.  Home Medications   Prior to Admission medications   Medication Sig Start Date End Date Taking? Authorizing Provider  acetaminophen (TYLENOL) 500 MG tablet Take 500 mg by mouth every 6 (six) hours as needed for mild pain.    Historical Provider, MD  albuterol (PROVENTIL HFA;VENTOLIN HFA) 108 (90 Base) MCG/ACT inhaler Inhale 2 puffs into the lungs every 6 (six) hours as needed for wheezing or shortness of breath. 05/16/15   Mihai Croitoru, MD  allopurinol (ZYLOPRIM) 100 MG tablet Take 1 tablet (100 mg total) by mouth every morning. 07/10/15   Jonah Blue, MD  diltiazem (CARDIZEM SR) 120 MG 12 hr capsule Take 1 capsule (120 mg total) by mouth 2 (two) times daily. 06/02/15   Mihai Croitoru, MD  feeding supplement, ENSURE COMPLETE, (ENSURE COMPLETE) LIQD Take 237 mLs by mouth 2 (two) times daily between meals. 12/18/13   Nishant Dhungel, MD  fluticasone furoate-vilanterol (BREO ELLIPTA) 100-25 MCG/INH AEPB Inhale 1 puff into the lungs daily.    Historical Provider, MD  furosemide (LASIX) 20 MG tablet Take 3 tablets (60 mg total) by mouth daily. 07/10/15   Jonah Blue, MD  furosemide (LASIX) 40 MG tablet Take 60 mg by mouth daily.  07/01/15   Historical Provider, MD  methimazole (TAPAZOLE) 5 MG tablet Take 5 mg by mouth every morning.     Historical Provider, MD  metoprolol tartrate (LOPRESSOR) 25 MG tablet Take 1 tablet (25 mg total) by mouth 2 (two) times daily. 07/10/15   Jonah Blue, MD  oxyCODONE (ROXICODONE) 5 MG immediate release tablet Take 0.5 tablets (2.5 mg total) by mouth every 12 (twelve) hours as needed (for shortness of breath if needed). 07/10/15 07/09/16  Tora Kindred York, PA-C  pantoprazole (PROTONIX) 40 MG tablet Take 1 tablet (40 mg total) by mouth daily. 07/10/15   Jonah Blue, MD  polyethylene glycol [facility]  Ethelene Hal) packet Take 17 g by mouth daily. Patient taking differently: Take 17 g by mouth daily as needed for moderate constipation.  03/20/15   Osvaldo Shipper, MD  senna (SENOKOT) 8.6 MG TABS tablet Take 1 tablet (8.6 mg total) by mouth daily. 07/10/15   Jonah Blue, MD  sodium bicarbonate 650 MG tablet Take 1 tablet (650 mg total) by mouth 3 (three) times daily. 03/20/15   Osvaldo Shipper, MD  traMADol (ULTRAM) 50 MG tablet Take 50-100 mg by mouth every 6 (six) hours as needed for moderate pain.  10/31/12   Historical Provider, MD  warfarin (COUMADIN) 3 MG tablet Take 1 tablet (3 mg total) by mouth daily at 6 PM. Take 1 1/2 tablets tonight and then resume 1 tablet (3 mg) once daily; follow-up with your doctor for recheck of your INR next week. 07/10/15   Jonah Blue, MD   BP 169/94 mmHg  Pulse 81  Temp(Src) 98.2 F (36.8 C)  Resp 20  Ht  (1.626 m)  Wt 104 lb 9 oz (47.429 kg)  BMI 17.94 kg/m2  SpO2 100% Physical Exam  Constitutional: She is oriented to person, place, and time. She appears well-developed and well-nourished.  HENT:  Head: Normocephalic and atraumatic.  Right Ear: External ear normal.  Left Ear: External ear normal.  Nose: Nose normal.  No obvious head trauma   Eyes: Right eye exhibits no discharge. Left eye exhibits no discharge.  Cardiovascular: Normal rate, regular rhythm and normal heart sounds.   Pulses:      Dorsalis pedis pulses are 2+ on the right side, and 2+ on the left side.  Pulmonary/Chest: Effort normal and breath sounds normal.  Abdominal: Soft. There is no tenderness.  Musculoskeletal:       Left hip: She exhibits tenderness (Lateral). She exhibits normal range of motion.       Left knee: She exhibits swelling (mild). She exhibits normal range of motion. No tenderness found.       Left upper leg: She exhibits no tenderness.       Left foot: There is tenderness (distal).  Neurological: She is alert and oriented to person, place, and time.  Skin:  Skin is warm and dry.  Nursing note and vitals reviewed.   ED Course  Procedures   DIAGNOSTIC STUDIES:  Oxygen Saturation is 100% on 2 L [place], normal by my interpretation.    COORDINATION OF CARE:  12:38 AM Discussed treatment plan with pt at bedside and pt agreed to plan.  Labs Review Labs Reviewed - No data to display  Imaging Review Dg Knee Complete 4 Views Left  07/24/2015  CLINICAL DATA:  82 year old female with fall and left knee pain. EXAM: LEFT KNEE - COMPLETE 4+ VIEW COMPARISON:  Radiograph dated  06/15/2011 FINDINGS: No definite acute fracture or dislocation identified, however evaluation is very limited due to advanced osteopenia and degenerative changes. The bones are osteopenic. There is severe osteoarthritic changes with tricompartmental narrowing and osteophyte formation. The knee is in flexed position. A small suprapatellar effusion noted. The soft tissues appear unremarkable. IMPRESSION: No definite acute fracture or dislocation. Osteopenia with severe arthritic changes. Electronically Signed   By: Elgie CollardArash  Radparvar M.D.   On: 07/24/2015 02:50   Dg Foot Complete Left  07/24/2015  CLINICAL DATA:  82 year old female with fall and left hip pain. EXAM: LEFT FOOT - COMPLETE 3+ VIEW COMPARISON:  None. FINDINGS: There is no acute fracture or dislocation. The bones are osteopenic. There is mild hallux valgus deformity. There is mild subcutaneous edema. The soft tissues otherwise appear unremarkable. IMPRESSION: No acute fracture or dislocation. Electronically Signed   By: Elgie CollardArash  Radparvar M.D.   On: 07/24/2015 02:51   Dg Hip Unilat With Pelvis 2-3 Views Left  07/24/2015  CLINICAL DATA:  Worsening left hip pain after a fall yesterday. EXAM: DG HIP (WITH OR WITHOUT PELVIS) 2-3V LEFT COMPARISON:  None. FINDINGS: Severe degenerative changes in both hips, greater on the left. Left hip demonstrates deformity and sclerosis of the femoral head with superior subluxation and remodeling of the  acetabulum. This could indicate old hip dysplasia or avascular necrosis. No evidence of acute fracture or dislocation. Visualized pelvis appears intact. Vascular calcifications. IMPRESSION: Severe degenerative changes in the left hip with deformities of the hip and acetabulum suggesting either old hip dysplasia or avascular necrosis. Aortic atherosclerosis. Electronically Signed   By: Burman NievesWilliam  Stevens M.D.   On: 07/24/2015 02:50   I have personally reviewed and evaluated these images and lab results as part of my medical decision-making.   EKG Interpretation None      MDM   Final diagnoses:  Fall, initial encounter  Arthritis of left hip    Patient's pain has improved with oral tramadol. Patient is able to ambulate and bear weight on her left extremity. X-rays show no obvious fractures. She does have urgency severe arthritis in the left hip which patient is aware of and follows up with orthopedics. She is on warfarin but did not hit her head, has no headache, and no loss of consciousness. No signs of bleeding. At this point will recommend ice, pain control, and follow-up with PCP and/or ortho.  I personally performed the services described in this documentation, which was scribed in my presence. The recorded information has been reviewed and is accurate.    Pricilla LovelessScott Treavor Blomquist, MD 07/24/15 [phone]

## 2015-07-24 NOTE — ED Notes (Signed)
Patient transported to X-ray 

## 2015-07-24 NOTE — ED Notes (Signed)
Will ambulate patient after completion of radiology study

## 2015-07-25 DIAGNOSIS — Z7901 Long term (current) use of anticoagulants: Secondary | ICD-10-CM | POA: Diagnosis not present

## 2015-07-25 DIAGNOSIS — N184 Chronic kidney disease, stage 4 (severe): Secondary | ICD-10-CM | POA: Diagnosis not present

## 2015-07-25 DIAGNOSIS — J449 Chronic obstructive pulmonary disease, unspecified: Secondary | ICD-10-CM | POA: Diagnosis not present

## 2015-07-25 DIAGNOSIS — Z7952 Long term (current) use of systemic steroids: Secondary | ICD-10-CM | POA: Diagnosis not present

## 2015-07-25 DIAGNOSIS — I130 Hypertensive heart and chronic kidney disease with heart failure and stage 1 through stage 4 chronic kidney disease, or unspecified chronic kidney disease: Secondary | ICD-10-CM | POA: Diagnosis not present

## 2015-07-25 DIAGNOSIS — Z5181 Encounter for therapeutic drug level monitoring: Secondary | ICD-10-CM | POA: Diagnosis not present

## 2015-07-25 DIAGNOSIS — R5381 Other malaise: Secondary | ICD-10-CM | POA: Diagnosis not present

## 2015-07-25 DIAGNOSIS — I82403 Acute embolism and thrombosis of unspecified deep veins of lower extremity, bilateral: Secondary | ICD-10-CM | POA: Diagnosis not present

## 2015-07-25 DIAGNOSIS — R269 Unspecified abnormalities of gait and mobility: Secondary | ICD-10-CM | POA: Diagnosis not present

## 2015-07-25 DIAGNOSIS — E43 Unspecified severe protein-calorie malnutrition: Secondary | ICD-10-CM | POA: Diagnosis not present

## 2015-07-25 DIAGNOSIS — M129 Arthropathy, unspecified: Secondary | ICD-10-CM | POA: Diagnosis not present

## 2015-07-25 DIAGNOSIS — J441 Chronic obstructive pulmonary disease with (acute) exacerbation: Secondary | ICD-10-CM | POA: Diagnosis not present

## 2015-07-25 DIAGNOSIS — I5030 Unspecified diastolic (congestive) heart failure: Secondary | ICD-10-CM | POA: Diagnosis not present

## 2015-07-25 DIAGNOSIS — M25552 Pain in left hip: Secondary | ICD-10-CM | POA: Diagnosis not present

## 2015-07-25 DIAGNOSIS — E039 Hypothyroidism, unspecified: Secondary | ICD-10-CM | POA: Diagnosis not present

## 2015-07-28 ENCOUNTER — Telehealth: Payer: Self-pay | Admitting: Cardiovascular Disease

## 2015-07-28 DIAGNOSIS — N184 Chronic kidney disease, stage 4 (severe): Secondary | ICD-10-CM | POA: Diagnosis not present

## 2015-07-28 DIAGNOSIS — E039 Hypothyroidism, unspecified: Secondary | ICD-10-CM | POA: Diagnosis not present

## 2015-07-28 DIAGNOSIS — Z7901 Long term (current) use of anticoagulants: Secondary | ICD-10-CM | POA: Diagnosis not present

## 2015-07-28 DIAGNOSIS — Z7952 Long term (current) use of systemic steroids: Secondary | ICD-10-CM | POA: Diagnosis not present

## 2015-07-28 DIAGNOSIS — E43 Unspecified severe protein-calorie malnutrition: Secondary | ICD-10-CM | POA: Diagnosis not present

## 2015-07-28 DIAGNOSIS — Z5181 Encounter for therapeutic drug level monitoring: Secondary | ICD-10-CM | POA: Diagnosis not present

## 2015-07-28 DIAGNOSIS — I130 Hypertensive heart and chronic kidney disease with heart failure and stage 1 through stage 4 chronic kidney disease, or unspecified chronic kidney disease: Secondary | ICD-10-CM | POA: Diagnosis not present

## 2015-07-28 DIAGNOSIS — J441 Chronic obstructive pulmonary disease with (acute) exacerbation: Secondary | ICD-10-CM | POA: Diagnosis not present

## 2015-07-28 DIAGNOSIS — I82403 Acute embolism and thrombosis of unspecified deep veins of lower extremity, bilateral: Secondary | ICD-10-CM | POA: Diagnosis not present

## 2015-07-28 DIAGNOSIS — I5030 Unspecified diastolic (congestive) heart failure: Secondary | ICD-10-CM | POA: Diagnosis not present

## 2015-07-28 NOTE — Telephone Encounter (Signed)
New message      Calling to see if you received the lab results drawn last Friday by [facility]

## 2015-07-30 DIAGNOSIS — Z5181 Encounter for therapeutic drug level monitoring: Secondary | ICD-10-CM | POA: Diagnosis not present

## 2015-07-30 DIAGNOSIS — I82403 Acute embolism and thrombosis of unspecified deep veins of lower extremity, bilateral: Secondary | ICD-10-CM | POA: Diagnosis not present

## 2015-07-30 DIAGNOSIS — J441 Chronic obstructive pulmonary disease with (acute) exacerbation: Secondary | ICD-10-CM | POA: Diagnosis not present

## 2015-07-30 DIAGNOSIS — I5030 Unspecified diastolic (congestive) heart failure: Secondary | ICD-10-CM | POA: Diagnosis not present

## 2015-07-30 DIAGNOSIS — Z7901 Long term (current) use of anticoagulants: Secondary | ICD-10-CM | POA: Diagnosis not present

## 2015-07-30 DIAGNOSIS — I130 Hypertensive heart and chronic kidney disease with heart failure and stage 1 through stage 4 chronic kidney disease, or unspecified chronic kidney disease: Secondary | ICD-10-CM | POA: Diagnosis not present

## 2015-07-30 DIAGNOSIS — E039 Hypothyroidism, unspecified: Secondary | ICD-10-CM | POA: Diagnosis not present

## 2015-07-30 DIAGNOSIS — F17210 Nicotine dependence, cigarettes, uncomplicated: Secondary | ICD-10-CM | POA: Diagnosis not present

## 2015-07-30 DIAGNOSIS — Z7952 Long term (current) use of systemic steroids: Secondary | ICD-10-CM | POA: Diagnosis not present

## 2015-07-30 DIAGNOSIS — E43 Unspecified severe protein-calorie malnutrition: Secondary | ICD-10-CM | POA: Diagnosis not present

## 2015-07-30 DIAGNOSIS — N184 Chronic kidney disease, stage 4 (severe): Secondary | ICD-10-CM | POA: Diagnosis not present

## 2015-07-31 ENCOUNTER — Observation Stay (HOSPITAL_COMMUNITY)
Admission: EM | Admit: 2015-07-31 | Discharge: 2015-08-02 | Disposition: A | Discharge status: Home / Self Care | Payer: Medicare Other | Attending: Internal Medicine | Admitting: Internal Medicine

## 2015-07-31 ENCOUNTER — Emergency Department (HOSPITAL_COMMUNITY): Payer: Medicare Other

## 2015-07-31 ENCOUNTER — Encounter (HOSPITAL_COMMUNITY): Payer: Self-pay | Admitting: Emergency Medicine

## 2015-07-31 DIAGNOSIS — I130 Hypertensive heart and chronic kidney disease with heart failure and stage 1 through stage 4 chronic kidney disease, or unspecified chronic kidney disease: Secondary | ICD-10-CM | POA: Diagnosis not present

## 2015-07-31 DIAGNOSIS — Z9981 Dependence on supplemental oxygen: Secondary | ICD-10-CM | POA: Diagnosis not present

## 2015-07-31 DIAGNOSIS — I5020 Unspecified systolic (congestive) heart failure: Secondary | ICD-10-CM | POA: Diagnosis not present

## 2015-07-31 DIAGNOSIS — J449 Chronic obstructive pulmonary disease, unspecified: Secondary | ICD-10-CM | POA: Diagnosis not present

## 2015-07-31 DIAGNOSIS — I5043 Acute on chronic combined systolic (congestive) and diastolic (congestive) heart failure: Secondary | ICD-10-CM | POA: Insufficient documentation

## 2015-07-31 DIAGNOSIS — Z87891 Personal history of nicotine dependence: Secondary | ICD-10-CM | POA: Diagnosis not present

## 2015-07-31 DIAGNOSIS — R0600 Dyspnea, unspecified: Secondary | ICD-10-CM

## 2015-07-31 DIAGNOSIS — D631 Anemia in chronic kidney disease: Secondary | ICD-10-CM | POA: Insufficient documentation

## 2015-07-31 DIAGNOSIS — J410 Simple chronic bronchitis: Secondary | ICD-10-CM

## 2015-07-31 DIAGNOSIS — R0602 Shortness of breath: Secondary | ICD-10-CM | POA: Diagnosis not present

## 2015-07-31 DIAGNOSIS — E43 Unspecified severe protein-calorie malnutrition: Secondary | ICD-10-CM

## 2015-07-31 DIAGNOSIS — I051 Rheumatic mitral insufficiency: Secondary | ICD-10-CM | POA: Diagnosis not present

## 2015-07-31 DIAGNOSIS — I5023 Acute on chronic systolic (congestive) heart failure: Secondary | ICD-10-CM | POA: Diagnosis not present

## 2015-07-31 DIAGNOSIS — I4891 Unspecified atrial fibrillation: Secondary | ICD-10-CM | POA: Insufficient documentation

## 2015-07-31 DIAGNOSIS — E0590 Thyrotoxicosis, unspecified without thyrotoxic crisis or storm: Secondary | ICD-10-CM | POA: Diagnosis not present

## 2015-07-31 DIAGNOSIS — Z681 Body mass index (BMI) 19 or less, adult: Secondary | ICD-10-CM | POA: Diagnosis not present

## 2015-07-31 DIAGNOSIS — R636 Underweight: Secondary | ICD-10-CM | POA: Diagnosis not present

## 2015-07-31 DIAGNOSIS — M1990 Unspecified osteoarthritis, unspecified site: Secondary | ICD-10-CM | POA: Diagnosis not present

## 2015-07-31 DIAGNOSIS — M109 Gout, unspecified: Secondary | ICD-10-CM | POA: Insufficient documentation

## 2015-07-31 DIAGNOSIS — Z7901 Long term (current) use of anticoagulants: Secondary | ICD-10-CM | POA: Diagnosis not present

## 2015-07-31 DIAGNOSIS — N184 Chronic kidney disease, stage 4 (severe): Secondary | ICD-10-CM | POA: Diagnosis not present

## 2015-07-31 DIAGNOSIS — R918 Other nonspecific abnormal finding of lung field: Secondary | ICD-10-CM | POA: Diagnosis not present

## 2015-07-31 DIAGNOSIS — Z90710 Acquired absence of both cervix and uterus: Secondary | ICD-10-CM | POA: Insufficient documentation

## 2015-07-31 DIAGNOSIS — I110 Hypertensive heart disease with heart failure: Secondary | ICD-10-CM | POA: Diagnosis not present

## 2015-07-31 HISTORY — DX: Gastro-esophageal reflux disease without esophagitis: K21.9

## 2015-07-31 HISTORY — DX: Acute embolism and thrombosis of unspecified deep veins of unspecified lower extremity: I82.409

## 2015-07-31 HISTORY — DX: Thyrotoxicosis, unspecified without thyrotoxic crisis or storm: E05.90

## 2015-07-31 HISTORY — DX: Dependence on supplemental oxygen: Z99.81

## 2015-07-31 LAB — CBC
HEMATOCRIT: 28.3 % — AB (ref 36.0–46.0)
HEMOGLOBIN: 9.3 g/dL — AB (ref 12.0–15.0)
MCH: 33.9 pg (ref 26.0–34.0)
MCHC: 32.9 g/dL (ref 30.0–36.0)
MCV: 103.3 fL — AB (ref 78.0–100.0)
Platelets: 176 10*3/uL (ref 150–400)
RBC: 2.74 MIL/uL — AB (ref 3.87–5.11)
RDW: 16.1 % — ABNORMAL HIGH (ref 11.5–15.5)
WBC: 3.9 10*3/uL — AB (ref 4.0–10.5)

## 2015-07-31 LAB — BASIC METABOLIC PANEL
Anion gap: 5 (ref 5–15)
BUN: 40 mg/dL — ABNORMAL HIGH (ref 6–20)
CHLORIDE: 114 mmol/L — AB (ref 101–111)
CO2: 19 mmol/L — AB (ref 22–32)
Calcium: 10 mg/dL (ref 8.9–10.3)
Creatinine, Ser: 2.87 mg/dL — ABNORMAL HIGH (ref 0.44–1.00)
GFR calc non Af Amer: 14 mL/min — ABNORMAL LOW (ref >60)
GFR, EST AFRICAN AMERICAN: 17 mL/min — AB (ref >60)
Glucose, Bld: 110 mg/dL — ABNORMAL HIGH (ref 65–99)
POTASSIUM: 4.5 mmol/L (ref 3.5–5.1)
SODIUM: 138 mmol/L (ref 135–145)

## 2015-07-31 LAB — I-STAT TROPONIN, ED: Troponin i, poc: 0.02 ng/mL (ref 0.00–0.08)

## 2015-07-31 LAB — PROTIME-INR
INR: 2.43 — ABNORMAL HIGH (ref 0.00–1.49)
PROTHROMBIN TIME: 26.1 s — AB (ref 11.6–15.2)

## 2015-07-31 LAB — BRAIN NATRIURETIC PEPTIDE: B NATRIURETIC PEPTIDE 5: 1181.9 pg/mL — AB (ref 0.0–100.0)

## 2015-07-31 MED ORDER — FUROSEMIDE 10 MG/ML IJ SOLN
40.0000 mg | INTRAMUSCULAR | Status: AC
  Administered 2015-07-31: 40 mg via INTRAVENOUS
  Filled 2015-07-31: qty 4

## 2015-07-31 MED ORDER — SODIUM CHLORIDE 0.9% FLUSH: 3.0000 mL | INTRAVENOUS | Status: DC | PRN

## 2015-07-31 MED ORDER — SODIUM CHLORIDE 0.9 % IV SOLN: 250.0000 mL | INTRAVENOUS | Status: DC | PRN

## 2015-07-31 MED ORDER — WARFARIN - PHYSICIAN DOSING INPATIENT
Freq: Every day | Status: DC
  Administered 2015-08-01: 18:00:00

## 2015-07-31 MED ORDER — ALLOPURINOL 100 MG PO TABS
100.0000 mg | ORAL_TABLET | Freq: Every morning | ORAL | Status: DC
  Administered 2015-08-01 – 2015-08-02 (×2): 100 mg via ORAL
  Filled 2015-07-31 (×2): qty 1

## 2015-07-31 MED ORDER — SODIUM BICARBONATE 650 MG PO TABS
650.0000 mg | ORAL_TABLET | Freq: Three times a day (TID) | ORAL | Status: DC
  Administered 2015-07-31 – 2015-08-02 (×5): 650 mg via ORAL
  Filled 2015-07-31 (×5): qty 1

## 2015-07-31 MED ORDER — SODIUM CHLORIDE 0.9% FLUSH
3.0000 mL | Freq: Two times a day (BID) | INTRAVENOUS | Status: DC
  Administered 2015-07-31 – 2015-08-02 (×4): 3 mL via INTRAVENOUS

## 2015-07-31 MED ORDER — ACETAMINOPHEN 325 MG PO TABS
650.0000 mg | ORAL_TABLET | ORAL | Status: DC | PRN
  Administered 2015-08-01: 650 mg via ORAL
  Filled 2015-07-31: qty 2

## 2015-07-31 MED ORDER — PANTOPRAZOLE SODIUM 40 MG PO TBEC
40.0000 mg | DELAYED_RELEASE_TABLET | Freq: Every day | ORAL | Status: DC
  Administered 2015-08-01 – 2015-08-02 (×2): 40 mg via ORAL
  Filled 2015-07-31 (×2): qty 1

## 2015-07-31 MED ORDER — FLUTICASONE FUROATE-VILANTEROL 100-25 MCG/INH IN AEPB
1.0000 | INHALATION_SPRAY | Freq: Every day | RESPIRATORY_TRACT | Status: DC
  Administered 2015-08-02: 1 via RESPIRATORY_TRACT
  Filled 2015-07-31: qty 28

## 2015-07-31 MED ORDER — FUROSEMIDE 10 MG/ML IJ SOLN: 60.0000 mg | Freq: Two times a day (BID) | INTRAMUSCULAR | Status: DC

## 2015-07-31 MED ORDER — ACETAMINOPHEN 500 MG PO TABS: 500.0000 mg | ORAL_TABLET | Freq: Four times a day (QID) | ORAL | Status: DC | PRN

## 2015-07-31 MED ORDER — ENOXAPARIN SODIUM 40 MG/0.4ML ~~LOC~~ SOLN: 40.0000 mg | SUBCUTANEOUS | Status: DC

## 2015-07-31 MED ORDER — METHIMAZOLE 10 MG PO TABS
5.0000 mg | ORAL_TABLET | Freq: Every morning | ORAL | Status: DC
  Administered 2015-08-01 – 2015-08-02 (×2): 5 mg via ORAL
  Filled 2015-07-31 (×4): qty 0.5

## 2015-07-31 MED ORDER — FUROSEMIDE 10 MG/ML IJ SOLN
40.0000 mg | Freq: Two times a day (BID) | INTRAMUSCULAR | Status: AC
  Administered 2015-07-31 – 2015-08-01 (×2): 40 mg via INTRAVENOUS
  Filled 2015-07-31 (×2): qty 4

## 2015-07-31 MED ORDER — ENOXAPARIN SODIUM 30 MG/0.3ML ~~LOC~~ SOLN
30.0000 mg | SUBCUTANEOUS | Status: DC
  Administered 2015-07-31: 30 mg via SUBCUTANEOUS
  Filled 2015-07-31: qty 0.3

## 2015-07-31 MED ORDER — ALBUTEROL SULFATE (2.5 MG/3ML) 0.083% IN NEBU: 2.5000 mg | INHALATION_SOLUTION | Freq: Four times a day (QID) | RESPIRATORY_TRACT | Status: DC | PRN

## 2015-07-31 MED ORDER — SENNA 8.6 MG PO TABS
1.0000 | ORAL_TABLET | Freq: Every day | ORAL | Status: DC
  Administered 2015-08-01 – 2015-08-02 (×2): 8.6 mg via ORAL
  Filled 2015-07-31 (×2): qty 1

## 2015-07-31 MED ORDER — FUROSEMIDE 40 MG PO TABS: 60.0000 mg | ORAL_TABLET | Freq: Every day | ORAL | Status: DC

## 2015-07-31 MED ORDER — TRAMADOL HCL 50 MG PO TABS
50.0000 mg | ORAL_TABLET | Freq: Once | ORAL | Status: AC
  Administered 2015-07-31: 50 mg via ORAL
  Filled 2015-07-31: qty 1

## 2015-07-31 MED ORDER — POLYETHYLENE GLYCOL 3350 17 G PO PACK
17.0000 g | PACK | Freq: Every day | ORAL | Status: DC
  Administered 2015-08-01 – 2015-08-02 (×2): 17 g via ORAL
  Filled 2015-07-31 (×2): qty 1

## 2015-07-31 MED ORDER — FUROSEMIDE 10 MG/ML IJ SOLN: 40.0000 mg | Freq: Two times a day (BID) | INTRAMUSCULAR | Status: DC

## 2015-07-31 MED ORDER — BOOST PLUS PO LIQD
237.0000 mL | Freq: Two times a day (BID) | ORAL | Status: DC
  Administered 2015-08-01 – 2015-08-02 (×3): 237 mL via ORAL
  Filled 2015-07-31 (×6): qty 237

## 2015-07-31 MED ORDER — METOPROLOL TARTRATE 25 MG PO TABS
25.0000 mg | ORAL_TABLET | Freq: Two times a day (BID) | ORAL | Status: DC
  Administered 2015-07-31 – 2015-08-02 (×4): 25 mg via ORAL
  Filled 2015-07-31 (×4): qty 1

## 2015-07-31 MED ORDER — TRAMADOL HCL 50 MG PO TABS
50.0000 mg | ORAL_TABLET | Freq: Four times a day (QID) | ORAL | Status: DC | PRN
  Administered 2015-07-31 – 2015-08-02 (×6): 100 mg via ORAL
  Filled 2015-07-31 (×6): qty 2

## 2015-07-31 MED ORDER — DILTIAZEM HCL ER 60 MG PO CP12
120.0000 mg | ORAL_CAPSULE | Freq: Two times a day (BID) | ORAL | Status: DC
  Administered 2015-07-31 – 2015-08-02 (×4): 120 mg via ORAL
  Filled 2015-07-31 (×5): qty 2

## 2015-07-31 MED ORDER — WARFARIN SODIUM 2 MG PO TABS
2.0000 mg | ORAL_TABLET | ORAL | Status: DC
  Administered 2015-07-31: 2 mg via ORAL
  Filled 2015-07-31: qty 1

## 2015-07-31 MED ORDER — WARFARIN SODIUM 3 MG PO TABS
3.0000 mg | ORAL_TABLET | ORAL | Status: DC
  Administered 2015-08-01: 3 mg via ORAL
  Filled 2015-07-31 (×2): qty 1

## 2015-07-31 MED ORDER — OXYCODONE HCL 5 MG PO TABS: 2.5000 mg | ORAL_TABLET | Freq: Two times a day (BID) | ORAL | Status: DC | PRN

## 2015-07-31 MED ORDER — WARFARIN SODIUM 2 MG PO TABS: 2.0000 mg | ORAL_TABLET | ORAL | Status: DC

## 2015-07-31 MED ORDER — ONDANSETRON HCL 4 MG/2ML IJ SOLN
4.0000 mg | Freq: Four times a day (QID) | INTRAMUSCULAR | Status: DC | PRN
Start: 2015-07-31 — End: 2015-08-02

## 2015-07-31 NOTE — ED Notes (Signed)
Pt states got sob while getting dressed this am is on home o2 at 2.5 . Felt like she was wheezing .  Does have AC , Denies cp

## 2015-07-31 NOTE — ED Provider Notes (Signed)
CSN: [id number]     Arrival date & time 07/31/15  1139 History   First MD Initiated Contact with Patient 07/31/15 1350     Chief Complaint  Patient presents with  . Shortness of Breath    [name] Alexander is a 82 y.o. female who presents to the ED complaining of worsening shortness of breath over the past 3 days. Patient reports she is also obtained 5 pounds and about 4 days. Patient was previously on Lasix but this was stopped approximately 2 weeks ago. She reports she is on home oxygen at 2 L a minute but bumped it up to 2-1/2. She reports worsening shortness of breath with exertion as well. She denies any chest pain. She reports her legs are more swollen than usual. Patient denies fevers, coughing, abdominal pain, nausea, vomiting, diarrhea, rashes, chest pain or palpitations.  Patient is a 82 y.o. female presenting with shortness of breath. The history is provided by the patient and a relative. No language interpreter was used.  Shortness of Breath Associated symptoms: no abdominal pain, no chest pain, no cough, no fever, no headaches, no neck pain, no rash, no sore throat and no vomiting     Past Medical History  Diagnosis Date  . Thyroid disease   . Hypertension   . Gout   . Pneumonia   . Arthritis   . COPD (chronic obstructive pulmonary disease) (HCC)   . Chronic diastolic CHF (congestive heart failure), NYHA class 2 (HCC)   . CKD (chronic kidney disease), stage IV [facility])    Past Surgical History  Procedure Laterality Date  . Abdominal hysterectomy    . Total knee arthroplasty     Family History  Problem Relation Age of Onset  . Stroke Neg Hx   . CAD Neg Hx    Social History  Substance Use Topics  . Smoking status: Former Smoker -- 0.25 packs/day for 20 years    Types: Cigarettes    Quit date: 03/12/2015  . Smokeless tobacco: Never Used  . Alcohol Use: No   OB History    No data available     Review of Systems  Constitutional: Negative for fever and chills.  HENT:  Negative for congestion and sore throat.   Eyes: Negative for visual disturbance.  Respiratory: Positive for shortness of breath. Negative for cough.   Cardiovascular: Positive for leg swelling. Negative for chest pain and palpitations.  Gastrointestinal: Negative for nausea, vomiting, abdominal pain and diarrhea.  Genitourinary: Negative for dysuria and difficulty urinating.  Musculoskeletal: Negative for back pain and neck pain.  Skin: Negative for rash.  Neurological: Negative for headaches.      Allergies  Review of patient's allergies indicates no known allergies.  Home Medications   Prior to Admission medications   Medication Sig Start Date End Date Taking? Authorizing Provider  acetaminophen (TYLENOL) 500 MG tablet Take 500 mg by mouth every 6 (six) hours as needed for mild pain.   Yes Historical Provider, MD  albuterol (PROVENTIL HFA;VENTOLIN HFA) 108 (90 Base) MCG/ACT inhaler Inhale 2 puffs into the lungs every 6 (six) hours as needed for wheezing or shortness of breath. 05/16/15  Yes Mihai Croitoru, MD  allopurinol (ZYLOPRIM) 100 MG tablet Take 1 tablet (100 mg total) by mouth every morning. 07/10/15  Yes Jonah Blue, MD  diltiazem (CARDIZEM SR) 120 MG 12 hr capsule Take 1 capsule (120 mg total) by mouth 2 (two) times daily. 06/02/15  Yes Mihai Croitoru, MD  feeding supplement, ENSURE  COMPLETE, (ENSURE COMPLETE) LIQD Take 237 mLs by mouth 2 (two) times daily between meals. Patient taking differently: Take 237 mLs by mouth daily.  12/18/13  Yes Nishant Dhungel, MD  fluticasone furoate-vilanterol (BREO ELLIPTA) 100-25 MCG/INH AEPB Inhale 1 puff into the lungs daily as needed (for SOB).    Yes Historical Provider, MD  methimazole (TAPAZOLE) 5 MG tablet Take 5 mg by mouth every morning.    Yes Historical Provider, MD  metoprolol tartrate (LOPRESSOR) 25 MG tablet Take 1 tablet (25 mg total) by mouth 2 (two) times daily. 07/10/15  Yes Jonah Blue, MD  OXYGEN Inhale 2 L into the lungs  continuous.   Yes Historical Provider, MD  pantoprazole (PROTONIX) 40 MG tablet Take 1 tablet (40 mg total) by mouth daily. 07/10/15  Yes Jonah Blue, MD  senna (SENOKOT) 8.6 MG TABS tablet Take 1 tablet (8.6 mg total) by mouth daily. 07/10/15  Yes Jonah Blue, MD  sodium bicarbonate 650 MG tablet Take 1 tablet (650 mg total) by mouth 3 (three) times daily. 03/20/15  Yes Osvaldo Shipper, MD  traMADol (ULTRAM) 50 MG tablet Take 50-100 mg by mouth every 6 (six) hours as needed for moderate pain.  10/31/12  Yes Historical Provider, MD  warfarin (COUMADIN) 3 MG tablet Take 1 tablet (3 mg total) by mouth daily at 6 PM. Take 1 1/2 tablets tonight and then resume 1 tablet (3 mg) once daily; follow-up with your doctor for recheck of your INR next week. Patient taking differently: Take 2-3 mg by mouth daily at 6 PM. Takes 2 tabs on mon and thurs Takes 3 tabs all other days 07/10/15  Yes Jonah Blue, MD   BP 140/98 mmHg  Pulse 93  Temp(Src) 98 F (36.7 C) (Oral)  Resp 18  SpO2 100% Physical Exam  Constitutional: She is oriented to person, place, and time. She appears well-developed and well-nourished. No distress.  Nontoxic appearing.  HENT:  Head: Normocephalic and atraumatic.  Right Ear: External ear normal.  Left Ear: External ear normal.  Mouth/Throat: Oropharynx is clear and moist.  Eyes: Conjunctivae are normal. Pupils are equal, round, and reactive to light. Right eye exhibits no discharge. Left eye exhibits no discharge.  Neck: Normal range of motion. Neck supple.  Cardiovascular: Normal rate and intact distal pulses.   Irregularly irregular rhythm. In A. fib on the monitor. Bilateral radial pulses are intact.  Pulmonary/Chest: Effort normal. No stridor. No respiratory distress. She has no wheezes. She has no rales.  Slight crackles noted bilateral bases. No increased work of breathing. Options saturation 100% on 2-1/2 L via nasal cannula.  Abdominal: Soft. There is no tenderness. There  is no guarding.  Musculoskeletal: She exhibits edema. She exhibits no tenderness.  3+ pitting edema to her bilateral lower extremities.  Lymphadenopathy:    She has no cervical adenopathy.  Neurological: She is alert and oriented to person, place, and time. Coordination normal.  Skin: Skin is warm and dry. No rash noted. She is not diaphoretic. No erythema. No pallor.  Psychiatric: She has a normal mood and affect. Her behavior is normal.  Nursing note and vitals reviewed.   ED Course  Procedures (including critical care time) Labs Review Labs Reviewed  BASIC METABOLIC PANEL - Abnormal; Notable for the following:    Chloride 114 (*)    CO2 19 (*)    Glucose, Bld 110 (*)    BUN 40 (*)    Creatinine, Ser 2.87 (*)    GFR calc non  Af Amer 14 (*)    GFR calc Af Amer 17 (*)    All other components within normal limits  CBC - Abnormal; Notable for the following:    WBC 3.9 (*)    RBC 2.74 (*)    Hemoglobin 9.3 (*)    HCT 28.3 (*)    MCV 103.3 (*)    RDW 16.1 (*)    All other components within normal limits  BRAIN NATRIURETIC PEPTIDE - Abnormal; Notable for the following:    B Natriuretic Peptide 1181.9 (*)    All other components within normal limits  PROTIME-INR - Abnormal; Notable for the following:    Prothrombin Time 26.1 (*)    INR 2.43 (*)    All other components within normal limits  I-STAT TROPOININ, ED    Imaging Review Dg Chest 2 View  07/31/2015  CLINICAL DATA:  WORSENING SOB PAST FEW DAYS,WHEEZING,HX COPD,HTN EXAM: CHEST  2 VIEW COMPARISON:  07/05/2015 FINDINGS: Heart is enlarged. There are coarse interstitial markings, similar in appearance to prior study. Bilateral pleural blunting noted. No overt edema is present. There are densities in the lateral upper lung zones bilaterally possibly related to overlying cardiac leads. Surgical clips are noted in the upper abdomen. IMPRESSION: 1. Cardiomegaly without edema. 2. Persistent and stable appearance of chronic  interstitial lung changes. 3. Suspect EKG leads accounting for the opacities in the upper lobes bilaterally. However I would recommend a follow-up PA view of the chest without leads to confirm the lack of pulmonary mass. Electronically Signed   By: Norva PavlovElizabeth  Brown M.D.   On: 07/31/2015 13:22   I have personally reviewed and evaluated these images and lab results as part of my medical decision-making.   EKG Interpretation   Date/Time:  Thursday July 31 2015 11:44:09 EDT Ventricular Rate:  85 PR Interval:    QRS Duration: 108 QT Interval:  374 QTC Calculation: 445 R Axis:   -66 Text Interpretation:  Atrial fibrillation Left axis deviation Low voltage  QRS Possible Inferior infarct , age undetermined Cannot rule out Anterior  infarct , age undetermined Abnormal ECG No STEMI.  Confirmed by LONG MD,  JOSHUA [phone]) on 07/31/2015 4:09:07 PM     Filed Vitals:   07/31/15 1148 07/31/15 1410 07/31/15 1500  BP: 141/77 149/98 140/98  Pulse: 89 82 93  Temp: 98 F (36.7 C)    TempSrc: Oral    Resp: 18 18 18   SpO2: 100% 100% 100%    MDM   Meds given in ED:  Medications  traMADol (ULTRAM) tablet 50 mg (50 mg Oral Given 07/31/15 1501)  furosemide (LASIX) injection 40 mg (40 mg Intravenous Given 07/31/15 1508)    New Prescriptions   No medications on file    Final diagnoses:  Acute on chronic combined systolic and diastolic heart failure (HCC)  Dyspnea   This is a 82 y.o. female who presents to the ED complaining of worsening shortness of breath over the past 3 days. Patient reports she is also obtained 5 pounds and about 4 days. Patient was previously on Lasix but this was stopped approximately 2 weeks ago. She reports she is on home oxygen at 2 L a minute but bumped it up to 2-1/2. She reports worsening shortness of breath with exertion as well. She denies any chest pain. On exam the patient is afebrile and nontoxic appearing. She is on 2.5 liters of oxygen via nasal cannula. Oxygen  saturation is 100% with this oxygen. She has slight  crackles to bilateral bases but no increased work of breathing. No rales or rhonchi. Patient has 3+ pitting edema to her bilateral lower extremities. Troponin is not elevated. BMP reveals a creatinine of 2.87. This is around her baseline. CBC shows a hemoglobin of 9.3. This is around her baseline. INR is therapeutic at 2.43. BNP is elevated at 1181. Chest x-ray shows cardiomegaly without edema. Persistent stable appearance of the chronic interstitial lung disease. Patient requires admission for diuresis. Patient received 40 mg IV Lasix. I consulted with Dr. Daphane Shepherd who accepted the patient for admission. She requested telemetry temporary admission orders. She also requested I speak with care management.  I consult with wand from case management and she will be by to see the patient.   This patient was discussed with Dr. Jacqulyn Bath who agrees with assessment and plan.   Everlene Farrier, PA-C 07/31/15 1624  Maia Plan, MD 08/01/15 [phone]

## 2015-07-31 NOTE — H&P (Signed)
History and Physical    [name] Alexander:[id number] DOB: [birth year] DOA: 07/31/2015  Referring MD/NP/PA: Richardean Sale   PCP: [name], MD   Patient coming from: home  Chief Complaint: dyspnea   HPI:  82 y.o. female with chronic combined systolic diastolic heart failure last ECHO 07/18/2015 with EF 45% and diastolic CHF, COPD on home oxygen 2 - 3L via [place], former smoker, chronic kidney disease, hypertension, A. fib on Coumadin, presented to the emergency department with main concern of 1-2 week duration of progressively worsening dyspnea present with exertion and present at rest.Pt''s son is at bedside and says that pt's lasix has been stopped two weeks ago but per review of records, last office visit 07/18/2015, dose of lasix was actually increased from 60 to 80 mg PO TID as pt's exam was consistent with volume overload. Pt says she has also noted progressively worsening LE swelling. No chest pain, no abd or urinary concerns. No fevers, chills, no focal neurological symptoms.   ED Course: Pt is hemodynamically stable, VSS, blood work notable for Cr 2.43, hg 9.3, exam notable for volume overload. Started on Lasix 40 mg IV and TRH asked to admit for further evaluation.   Review of Systems:  Constitutional: Negative for fever, chills, diaphoresis, and fatigue.  HENT: Negative for ear pain, nosebleeds, congestion, facial swelling, rhinorrhea, neck pain Eyes: Negative for pain, discharge, redness, itching and visual disturbance.  Respiratory: Negative for wheezing and stridor.   Cardiovascular: Negative for chest pain, palpitations.  Gastrointestinal: Negative for abdominal distention.  Genitourinary: Negative for dysuria, urgency, frequency, hematuria, flank pain, decreased urine volume Musculoskeletal: Negative for back pain, joint swelling, arthralgias and gait problem.  Neurological: Negative for dizziness, tremors, seizures, syncope, facial asymmetry, speech difficulty, weakness,  light-headedness, numbness and headaches.  Hematological: Negative for adenopathy. Does not bruise/bleed easily.  Psychiatric/Behavioral: Negative for hallucinations, behavioral problems, confusion  Past Medical History  Diagnosis Date  . Thyroid disease   . Hypertension   . Gout   . Pneumonia   . Arthritis   . COPD (chronic obstructive pulmonary disease) (HCC)   . Chronic diastolic CHF (congestive heart failure), NYHA class 2 (HCC)   . CKD (chronic kidney disease), stage IV [facility])     Past Surgical History  Procedure Laterality Date  . Abdominal hysterectomy    . Total knee arthroplasty       reports that she quit smoking about 4 months ago. Her smoking use included Cigarettes. She has a 5 pack-year smoking history. She has never used smokeless tobacco. She reports that she does not drink alcohol or use illicit drugs.  No Known Allergies  Family History  Problem Relation Age of Onset  . Stroke Neg Hx   . CAD Neg Hx     Prior to Admission medications   Medication Sig Start Date End Date Taking? Authorizing Provider  acetaminophen (TYLENOL) 500 MG tablet Take 500 mg by mouth every 6 (six) hours as needed for mild pain.    Historical Provider, MD  albuterol (PROVENTIL HFA;VENTOLIN HFA) 108 (90 Base) MCG/ACT inhaler Inhale 2 puffs into the lungs every 6 (six) hours as needed for wheezing or shortness of breath. 05/16/15   Mihai Croitoru, MD  allopurinol (ZYLOPRIM) 100 MG tablet Take 1 tablet (100 mg total) by mouth every morning. 07/10/15   Jonah Blue, MD  diltiazem (CARDIZEM SR) 120 MG 12 hr capsule Take 1 capsule (120 mg total) by mouth 2 (two) times daily. 06/02/15  Mihai Croitoru, MD  feeding supplement, ENSURE COMPLETE, (ENSURE COMPLETE) LIQD Take 237 mLs by mouth 2 (two) times daily between meals. 12/18/13   Nishant Dhungel, MD  fluticasone furoate-vilanterol (BREO ELLIPTA) 100-25 MCG/INH AEPB Inhale 1 puff into the lungs daily.    Historical Provider, MD  furosemide (LASIX)  20 MG tablet Take 3 tablets (60 mg total) by mouth daily. 07/10/15   Jonah Blue, MD  furosemide (LASIX) 40 MG tablet Take 60 mg by mouth daily.  07/01/15   Historical Provider, MD  methimazole (TAPAZOLE) 5 MG tablet Take 5 mg by mouth every morning.     Historical Provider, MD  metoprolol tartrate (LOPRESSOR) 25 MG tablet Take 1 tablet (25 mg total) by mouth 2 (two) times daily. 07/10/15   Jonah Blue, MD  oxyCODONE (ROXICODONE) 5 MG immediate release tablet Take 0.5 tablets (2.5 mg total) by mouth every 12 (twelve) hours as needed (for shortness of breath if needed). 07/10/15 07/09/16  Tora Kindred York, PA-C  pantoprazole (PROTONIX) 40 MG tablet Take 1 tablet (40 mg total) by mouth daily. 07/10/15   Jonah Blue, MD  polyethylene glycol [facility]) packet Take 17 g by mouth daily. Patient taking differently: Take 17 g by mouth daily as needed for moderate constipation.  03/20/15   Osvaldo Shipper, MD  senna (SENOKOT) 8.6 MG TABS tablet Take 1 tablet (8.6 mg total) by mouth daily. 07/10/15   Jonah Blue, MD  sodium bicarbonate 650 MG tablet Take 1 tablet (650 mg total) by mouth 3 (three) times daily. 03/20/15   Osvaldo Shipper, MD  traMADol (ULTRAM) 50 MG tablet Take 50-100 mg by mouth every 6 (six) hours as needed for moderate pain.  10/31/12   Historical Provider, MD  warfarin (COUMADIN) 3 MG tablet Take 1 tablet (3 mg total) by mouth daily at 6 PM. Take 1 1/2 tablets tonight and then resume 1 tablet (3 mg) once daily; follow-up with your doctor for recheck of your INR next week. 07/10/15   Jonah Blue, MD    Physical Exam: Filed Vitals:   07/31/15 1148 07/31/15 1410  BP: 141/77 149/98  Pulse: 89 82  Temp: 98 F (36.7 C)   TempSrc: Oral   Resp: 18 18  SpO2: 100% 100%    Constitutional: NAD, calm, comfortable Filed Vitals:   07/31/15 1148 07/31/15 1410  BP: 141/77 149/98  Pulse: 89 82  Temp: 98 F (36.7 C)   TempSrc: Oral   Resp: 18 18  SpO2: 100% 100%   Eyes: PERRL,  lids and conjunctivae normal ENMT: Posterior pharynx clear of any exudate or lesions.Normal dentition.  Neck: normal, supple, no masses, no thyromegaly Respiratory: No accessory muscle use. Bibasilar crackles  Cardiovascular: Irregular rate and rhythm, no rubs / gallops. +2 bilateral LE extremity edema.  Abdomen: no tenderness, no masses palpated. No hepatosplenomegaly. Bowel sounds positive.  Musculoskeletal: no clubbing / cyanosis. No joint deformity upper and lower extremities. Good ROM, no contractures.  Skin: no rashes, lesions, ulcers. No induration Neurologic: CN 2-12 grossly intact. Sensation intact, DTR normal. Strength 5/5 in all 4.  Psychiatric: Normal judgment and insight. Alert and oriented x 3. Normal mood.   Ulcers - none  Indwelling cath - none   Labs on Admission: I have personally reviewed following labs and imaging studies  CBC:  Recent Labs Lab 07/31/15 1156  WBC 3.9*  HGB 9.3*  HCT 28.3*  MCV 103.3*  PLT 176   Basic Metabolic Panel:  Recent Labs Lab 07/31/15 1156  NA 138  K 4.5  CL 114*  CO2 19*  GLUCOSE 110*  BUN 40*  CREATININE 2.87*  CALCIUM 10.0   Coagulation Profile:  Recent Labs Lab 07/31/15 1156  INR 2.43*   Urine analysis:    Component Value Date/Time   COLORURINE YELLOW 07/05/2015 0950   APPEARANCEUR CLEAR 07/05/2015 0950   LABSPEC 1.014 07/05/2015 0950   PHURINE 7.0 07/05/2015 0950   GLUCOSEU NEGATIVE 07/05/2015 0950   HGBUR SMALL* 07/05/2015 0950   BILIRUBINUR NEGATIVE 07/05/2015 0950   KETONESUR NEGATIVE 07/05/2015 0950   PROTEINUR 100* 07/05/2015 0950   UROBILINOGEN 0.2 12/12/2013 0414   NITRITE NEGATIVE 07/05/2015 0950   LEUKOCYTESUR NEGATIVE 07/05/2015 0950   Radiological Exams on Admission: Dg Chest 2 View  07/31/2015  CLINICAL DATA:  WORSENING SOB PAST FEW DAYS,WHEEZING,HX COPD,HTN EXAM: CHEST  2 VIEW COMPARISON:  07/05/2015 FINDINGS: Heart is enlarged. There are coarse interstitial markings, similar in appearance  to prior study. Bilateral pleural blunting noted. No overt edema is present. There are densities in the lateral upper lung zones bilaterally possibly related to overlying cardiac leads. Surgical clips are noted in the upper abdomen. IMPRESSION: 1. Cardiomegaly without edema. 2. Persistent and stable appearance of chronic interstitial lung changes. 3. Suspect EKG leads accounting for the opacities in the upper lobes bilaterally. However I would recommend a follow-up PA view of the chest without leads to confirm the lack of pulmonary mass. Electronically Signed   By: Norva PavlovElizabeth  Brown M.D.   On: 07/31/2015 13:22   EKG: pending   Assessment/Plan  Principal Problem:  Acute on Chronic respiratory distress due to acute on chronic systolic and diastolic heart failure - pt also with severe, eccentric mitral regurgitation due to Rheumatic heart disease - Please note that patient is on 2-3 L of oxygen at home and her oxygen saturations are stable for now on same regimen  - per last admission's cardiology report, pt has reduced systolic function that is likely worse than the 40-45% reported on echo, as it is overestimated in the setting of MR - will start her on lasix 40 mg IV today and reassess in Am before deciding on further lasix dosing - may need cardiology input but please note that per last admission notes review, pt was very clear she does not want any cardiac interventions, only medical management  - please note that Cr on last admission was as high as 3.1 but has trended down to 2.8 - also her weight on last admission has been stable at 101 - 103 lbs  - daily weights and strict I/O will be monitoring   Active Problems:  Atrial fibrillation, CHADS2 score 4 - At home patient is on Cardizem and Coumadin - Continue same regimen  Acute on CKD (chronic kidney disease) stage 4, GFR 15-29 ml/min (HCC) - with baseline Cr ~2 - 3  - continue Lasix  - BMP In AM   Normocytic anemia - and anemia of  CKD - CBC in AM   Hyperthyroidism - continue Methimazole - TSH 1.606 which is WNL    COPD (chronic obstructive pulmonary disease) (HCC), no acute exacerbation  - respiratory status stable this AM, oxygen stable and > 92% - pt will likely need home O2 as we are still unable to taper off oxygen    Underweight  - Body mass index is 17.72 kg/(m^2).  DVT prophylaxis: pt on Coumadin  Code Status: Full  Family Communication: Patient at bedside, son at bedside Disposition Plan: Home  Consults called: None Admission status: Observation   Debbora Presto MD [facility] Pager [phone]  If 7PM-7AM, please contact night-coverage [email or web address] Password [facility]  07/31/2015, 3:33 PM

## 2015-08-01 ENCOUNTER — Other Ambulatory Visit (HOSPITAL_COMMUNITY): Payer: Self-pay | Admitting: Internal Medicine

## 2015-08-01 DIAGNOSIS — I130 Hypertensive heart and chronic kidney disease with heart failure and stage 1 through stage 4 chronic kidney disease, or unspecified chronic kidney disease: Secondary | ICD-10-CM | POA: Diagnosis not present

## 2015-08-01 DIAGNOSIS — I5023 Acute on chronic systolic (congestive) heart failure: Secondary | ICD-10-CM

## 2015-08-01 DIAGNOSIS — I5043 Acute on chronic combined systolic (congestive) and diastolic (congestive) heart failure: Secondary | ICD-10-CM | POA: Diagnosis not present

## 2015-08-01 DIAGNOSIS — I5020 Unspecified systolic (congestive) heart failure: Secondary | ICD-10-CM | POA: Diagnosis not present

## 2015-08-01 LAB — BASIC METABOLIC PANEL
Anion gap: 7 (ref 5–15)
BUN: 44 mg/dL — AB (ref 6–20)
CO2: 22 mmol/L (ref 22–32)
Calcium: 10.5 mg/dL — ABNORMAL HIGH (ref 8.9–10.3)
Chloride: 110 mmol/L (ref 101–111)
Creatinine, Ser: 3 mg/dL — ABNORMAL HIGH (ref 0.44–1.00)
GFR, EST AFRICAN AMERICAN: 16 mL/min — AB (ref >60)
GFR, EST NON AFRICAN AMERICAN: 14 mL/min — AB (ref >60)
Glucose, Bld: 106 mg/dL — ABNORMAL HIGH (ref 65–99)
POTASSIUM: 4.4 mmol/L (ref 3.5–5.1)
SODIUM: 139 mmol/L (ref 135–145)

## 2015-08-01 LAB — PROTIME-INR
INR: 2.41 — ABNORMAL HIGH (ref 0.00–1.49)
Prothrombin Time: 26 seconds — ABNORMAL HIGH (ref 11.6–15.2)

## 2015-08-01 MED ORDER — ACETAMINOPHEN 325 MG PO TABS
650.0000 mg | ORAL_TABLET | Freq: Four times a day (QID) | ORAL | Status: DC | PRN
  Administered 2015-08-02: 650 mg via ORAL
  Filled 2015-08-01 (×2): qty 2

## 2015-08-01 NOTE — Care Management Obs Status (Signed)
MEDICARE OBSERVATION STATUS NOTIFICATION   Patient Details  Name: [name] BowenLillie B Urda MRN: [id number] Date of Birth: 08-May-1933   Medicare Observation Status Notification Given:  Yes    Cledis Sohn, Annamarie MajorCheryl U, RN 08/01/2015, 12:17 PM

## 2015-08-01 NOTE — Progress Notes (Signed)
Calm and pleasant sitting at bedside . Conversant with  Daughter in law/Respiration easy and regular

## 2015-08-01 NOTE — Consult Note (Signed)
   [facility]   08/01/2015  [name] 1934/01/09 [id number]  Patient evaluated for community based chronic disease management services with [facility] as a benefit of patient's [facility] [employer] Medicare Insurance. Spoke with patient at bedside, grandson present,  to explain [facility] Care Management services.  Patient is a re-admission for HF exacerbation.  Patient states, "I had trouble breathing."  Patient was alert and oriented except she states, "I have moved in with my son, [name] CanalesSteve and I don't know his address but you can call him."   Written consent signed.  Call placed to [name] Alexander at [phone] and left a message and he returned the call from [phone]. HIPAA verified.  [name] Alexander endorses that the patient is at his home [address] [place], [place] [id number].  He states that the patient is receiving patient some hours for personal care with [facility].  He states that have requested more hours of care as they work long hours.  He states he has received some calls from a palliative care service but doesn't know if she is in a program or not.  He has spoken with someone named [name] Alexander.  He states, "we really need all of the help we can get to help my mom."    Patient will receive post hospital discharge call and will be evaluated for monthly home visits for assessments and disease process education.  Left contact information and [facility] literature at bedside. Made Inpatient Case Manager aware that [facility] Care Management following. Of note, [facility] Care [facility] services does not replace or interfere with any services that are arranged by inpatient case management or social work.  For additional questions or referrals please contact:    [name] ShanksVictoria Audine Mangione, RN BSN CCM [facility] Lia[facility]  [phone] business mobile phone Toll free office [phone]

## 2015-08-01 NOTE — Discharge Instructions (Signed)

## 2015-08-01 NOTE — Progress Notes (Signed)
Patient ID: [name] Alexander, female   DOB: [birth year], 82 y.o.   MRN: [id number]    PROGRESS NOTE    [name] Alexander  WUJ:[id number] DOB: 1933/10/06 DOA: 07/31/2015  PCP: [name], MD   Brief Narrative:  82 y.o. female with chronic combined systolic diastolic heart failure last ECHO 07/18/2015 with EF 45% and diastolic CHF, COPD on home oxygen 2 - 3L via [place], former smoker, chronic kidney disease, hypertension, A. fib on Coumadin, presented to the emergency department with main concern of 1-2 week duration of progressively worsening dyspnea present with exertion and present at rest.Pt''s son is at bedside and says that pt's lasix has been stopped two weeks ago but per review of records, last office visit 07/18/2015, dose of lasix was actually increased from 60 to 80 mg PO TID as pt's exam was consistent with volume overload. Pt says she has also noted progressively worsening LE swelling. No chest pain, no abd or urinary concerns. No fevers, chills, no focal neurological symptoms.   ED Course: Pt is hemodynamically stable, VSS, blood work notable for Cr 2.43, hg 9.3, exam notable for volume overload. Started on Lasix 40 mg IV and TRH asked to admit for further evaluation.   Assessment/Plan  Principal Problem:  Acute on Chronic respiratory distress due to acute on chronic systolic and diastolic heart failure - pt also with severe, eccentric mitral regurgitation due to Rheumatic heart disease - Please note that patient is on 2-3 L of oxygen at home and her oxygen saturations are stable for now on same regimen  - per last admission's cardiology report, pt has reduced systolic function that is likely worse than the 40-45% reported on echo, as it is overestimated in the setting of MR - started on lasix 40 mg IV BID - may need cardiology input but please note that per last admission notes review, pt was very clear she does not want any cardiac interventions, only medical management  - please note  that Cr on last admission was as high as 3.1, now ~3 from lasix  - also her weight on last admission has been stable at 101 - 103 lbs  - weight was 109 lbs on this admission --> 106 lbs this AM  - daily weights and strict I/O will be monitoring   Active Problems:  Atrial fibrillation, CHADS2 score 4 - At home patient is on Cardizem and Coumadin - Continue same regimen  Acute on CKD (chronic kidney disease) stage 4, GFR 15-29 ml/min (HCC) - with baseline Cr ~2 - 3  - hold lasix today  - BMP In AM   Normocytic anemia - and anemia of CKD - CBC in AM   Hyperthyroidism - continue Methimazole - TSH 1.606 which is WNL    COPD (chronic obstructive pulmonary disease) (HCC), no acute exacerbation  - respiratory status stable this AM, oxygen stable and > 92% - pt will likely need home O2 as we are still unable to taper off oxygen    Underweight  - Body mass index is 17.72 kg/(m^2).  DVT prophylaxis: pt on Coumadin  Code Status: Full  Family Communication: Patient at bedside, son at bedside Disposition Plan: Home in AM  Consultants:   Cardiology   Procedures:   None  Antimicrobials:   None  Subjective: No events overnight.   Objective: Filed Vitals:   08/01/15 0032 08/01/15 0459 08/01/15 1004 08/01/15 1006  BP: 142/91 138/70 115/54   Pulse: 88 89  80  Temp: 98  F (36.7 C) 97.9 F (36.6 C)    TempSrc: Oral Oral    Resp: 18 18    Height:      Weight:  48.172 kg (106 lb 3.2 oz)    SpO2: 100% 99% 100%     Intake/Output Summary (Last 24 hours) at 08/01/15 1208 Last data filed at 08/01/15 1131  Gross per 24 hour  Intake    700 ml  Output   3025 ml  Net  -2325 ml   Filed Weights   07/31/15 1706 08/01/15 0459  Weight: 49.76 kg (109 lb 11.2 oz) 48.172 kg (106 lb 3.2 oz)    Examination:  General exam: Appears calm and comfortable  Respiratory system: Clear to auscultation. Respiratory effort normal. Cardiovascular system: SEM 3/6,murmurs, rubs,  gallops or clicks. 2+ bilateral pitting edema Gastrointestinal system: Abdomen is nondistended, soft and nontender. No organomegaly or masses felt. Normal bowel sounds heard. Central nervous system: Alert and oriented. No focal neurological deficits. Extremities: Symmetric 5 x 5 power. Skin: No rashes, lesions or ulcers Psychiatry: Judgement and insight appear normal. Mood & affect appropriate.    Data Reviewed: I have personally reviewed following labs and imaging studies  CBC:  Recent Labs Lab 07/31/15 1156  WBC 3.9*  HGB 9.3*  HCT 28.3*  MCV 103.3*  PLT 176   Basic Metabolic Panel:  Recent Labs Lab 07/31/15 1156 08/01/15 0344  NA 138 139  K 4.5 4.4  CL 114* 110  CO2 19* 22  GLUCOSE 110* 106*  BUN 40* 44*  CREATININE 2.87* 3.00*  CALCIUM 10.0 10.5*   Coagulation Profile:  Recent Labs Lab 07/31/15 1156 08/01/15 0950  INR 2.43* 2.41*   Urine analysis:    Component Value Date/Time   COLORURINE YELLOW 07/05/2015 0950   APPEARANCEUR CLEAR 07/05/2015 0950   LABSPEC 1.014 07/05/2015 0950   PHURINE 7.0 07/05/2015 0950   GLUCOSEU NEGATIVE 07/05/2015 0950   HGBUR SMALL* 07/05/2015 0950   BILIRUBINUR NEGATIVE 07/05/2015 0950   KETONESUR NEGATIVE 07/05/2015 0950   PROTEINUR 100* 07/05/2015 0950   UROBILINOGEN 0.2 12/12/2013 0414   NITRITE NEGATIVE 07/05/2015 0950   LEUKOCYTESUR NEGATIVE 07/05/2015 0950   Radiology Studies: Dg Chest 2 View  07/31/2015  CLINICAL DATA:  WORSENING SOB PAST FEW DAYS,WHEEZING,HX COPD,HTN EXAM: CHEST  2 VIEW COMPARISON:  07/05/2015 FINDINGS: Heart is enlarged. There are coarse interstitial markings, similar in appearance to prior study. Bilateral pleural blunting noted. No overt edema is present. There are densities in the lateral upper lung zones bilaterally possibly related to overlying cardiac leads. Surgical clips are noted in the upper abdomen. IMPRESSION: 1. Cardiomegaly without edema. 2. Persistent and stable appearance of chronic  interstitial lung changes. 3. Suspect EKG leads accounting for the opacities in the upper lobes bilaterally. However I would recommend a follow-up PA view of the chest without leads to confirm the lack of pulmonary mass. Electronically Signed   By: Norva Pavlov M.D.   On: 07/31/2015 13:22      Scheduled Meds: . allopurinol  100 mg Oral q morning - 10a  . diltiazem  120 mg Oral BID  . fluticasone furoate-vilanterol  1 puff Inhalation Daily  . lactose free nutrition  237 mL Oral BID BM  . methimazole  5 mg Oral q morning - 10a  . metoprolol tartrate  25 mg Oral BID  . pantoprazole  40 mg Oral Daily  . polyethylene glycol  17 g Oral Daily  . senna  1 tablet Oral Daily  .  sodium bicarbonate  650 mg Oral TID  . sodium chloride flush  3 mL Intravenous Q12H  . warfarin  2 mg Oral Once per day on Mon Thu  . warfarin  3 mg Oral Once per day on Sun Tue Wed Fri Sat  . Warfarin - Physician Dosing Inpatient   Does not apply q1800   Continuous Infusions:       Time spent: 20 minutes    Debbora PrestoMAGICK-Neli Fofana, MD [facility] Pager [phone]  If 7PM-7AM, please contact night-coverage [email or web address] Password [email or web address] 08/01/2015, 12:08 PM

## 2015-08-02 DIAGNOSIS — I5043 Acute on chronic combined systolic (congestive) and diastolic (congestive) heart failure: Secondary | ICD-10-CM

## 2015-08-02 DIAGNOSIS — I5020 Unspecified systolic (congestive) heart failure: Secondary | ICD-10-CM | POA: Diagnosis not present

## 2015-08-02 DIAGNOSIS — I5023 Acute on chronic systolic (congestive) heart failure: Secondary | ICD-10-CM | POA: Diagnosis not present

## 2015-08-02 DIAGNOSIS — I130 Hypertensive heart and chronic kidney disease with heart failure and stage 1 through stage 4 chronic kidney disease, or unspecified chronic kidney disease: Secondary | ICD-10-CM | POA: Diagnosis not present

## 2015-08-02 LAB — BASIC METABOLIC PANEL
ANION GAP: 7 (ref 5–15)
BUN: 52 mg/dL — ABNORMAL HIGH (ref 6–20)
CALCIUM: 10.1 mg/dL (ref 8.9–10.3)
CO2: 24 mmol/L (ref 22–32)
CREATININE: 3.18 mg/dL — AB (ref 0.44–1.00)
Chloride: 107 mmol/L (ref 101–111)
GFR calc Af Amer: 15 mL/min — ABNORMAL LOW (ref >60)
GFR, EST NON AFRICAN AMERICAN: 13 mL/min — AB (ref >60)
GLUCOSE: 104 mg/dL — AB (ref 65–99)
Potassium: 4.9 mmol/L (ref 3.5–5.1)
Sodium: 138 mmol/L (ref 135–145)

## 2015-08-02 LAB — CBC
HEMATOCRIT: 29 % — AB (ref 36.0–46.0)
Hemoglobin: 9.2 g/dL — ABNORMAL LOW (ref 12.0–15.0)
MCH: 32.6 pg (ref 26.0–34.0)
MCHC: 31.7 g/dL (ref 30.0–36.0)
MCV: 102.8 fL — AB (ref 78.0–100.0)
PLATELETS: 186 10*3/uL (ref 150–400)
RBC: 2.82 MIL/uL — ABNORMAL LOW (ref 3.87–5.11)
RDW: 16.3 % — AB (ref 11.5–15.5)
WBC: 4.6 10*3/uL (ref 4.0–10.5)

## 2015-08-02 LAB — PROTIME-INR
INR: 2.42 — ABNORMAL HIGH (ref 0.00–1.49)
Prothrombin Time: 26 seconds — ABNORMAL HIGH (ref 11.6–15.2)

## 2015-08-02 NOTE — Discharge Summary (Signed)
Physician Discharge Summary  [name] Alexander BJY:[id number] DOB: Dec 06, 1933 DOA: 07/31/2015  PCP: [name], MD  Admit date: 07/31/2015 Discharge date: 08/02/2015  Recommendations for Outpatient Follow-up:  1. Pt will need to follow up with PCP in 2-3 weeks post discharge 2. Please obtain BMP to evaluate electrolytes and kidney function 3. Please also check CBC to evaluate Hg and Hct levels 4. Pt wants to be discharged today    Discharge Diagnoses:  Active Problems:   CHF (congestive heart failure) (HCC)  Discharge Condition: Stable  Diet recommendation: Heart healthy diet discussed in details   Brief Narrative:  82 y.o. female with chronic combined systolic diastolic heart failure last ECHO 07/18/2015 with EF 45% and diastolic CHF, COPD on home oxygen 2 - 3L via [place], former smoker, chronic kidney disease, hypertension, A. fib on Coumadin, presented to the emergency department with main concern of 1-2 week duration of progressively worsening dyspnea present with exertion and present at rest.Pt''s son is at bedside and says that pt's lasix has been stopped two weeks ago but per review of records, last office visit 07/18/2015, dose of lasix was actually increased from 60 to 80 mg PO TID as pt's exam was consistent with volume overload. Pt says she has also noted progressively worsening LE swelling. No chest pain, no abd or urinary concerns. No fevers, chills, no focal neurological symptoms.   ED Course: Pt is hemodynamically stable, VSS, blood work notable for Cr 2.43, hg 9.3, exam notable for volume overload. Started on Lasix 40 mg IV and TRH asked to admit for further evaluation.   Assessment/Plan  Principal Problem:  Acute on Chronic respiratory distress due to acute on chronic systolic and diastolic heart failure - pt also with severe, eccentric mitral regurgitation due to Rheumatic heart disease - Please note that patient is on 2-3 L of oxygen at home and her oxygen saturations  are stable for now on same regimen  - per last admission's cardiology report, pt has reduced systolic function that is likely worse than the 40-45% reported on echo, as it is overestimated in the setting of MR - started on lasix 40 mg IV BID - may need cardiology input but please note that per last admission notes review, pt was very clear she does not want any cardiac interventions, only medical management  - please note that Cr on last admission was as high as 3.1, now ~3 from lasix  - pt was previously advised to stop taking lasix and since she insists on going home today, advised her to follow up with her cardiologist for further recommendations  - also her weight on last admission has been stable at 101 - 103 lbs  - weight was 109 lbs on this admission --> 106 lbs this AM   Active Problems:  Atrial fibrillation, CHADS2 score 4 - At home patient is on Cardizem and Coumadin - Continue same regimen  Acute on CKD (chronic kidney disease) stage 4, GFR 15-29 ml/min (HCC) - with baseline Cr ~2 - 3    Normocytic anemia - and anemia of CKD - no active bleeding    Hyperthyroidism - continue Methimazole - TSH 1.606 which is WNL    COPD (chronic obstructive pulmonary disease) (HCC), no acute exacerbation  - respiratory status stable this AM, oxygen stable and > 92%   Underweight  - Body mass index is 17.72 kg/(m^2).  DVT prophylaxis: pt on Coumadin  Code Status: Full  Family Communication: Patient at bedside, son at  bedside Disposition Plan: Home   Consultants:   Cardiology  Procedures:   None  Antimicrobials:   None Procedures/Studies: Dg Chest 2 View  07/31/2015  CLINICAL DATA:  WORSENING SOB PAST FEW DAYS,WHEEZING,HX COPD,HTN EXAM: CHEST  2 VIEW COMPARISON:  07/05/2015 FINDINGS: Heart is enlarged. There are coarse interstitial markings, similar in appearance to prior study. Bilateral pleural blunting noted. No overt edema is present. There are densities in  the lateral upper lung zones bilaterally possibly related to overlying cardiac leads. Surgical clips are noted in the upper abdomen. IMPRESSION: 1. Cardiomegaly without edema. 2. Persistent and stable appearance of chronic interstitial lung changes. 3. Suspect EKG leads accounting for the opacities in the upper lobes bilaterally. However I would recommend a follow-up PA view of the chest without leads to confirm the lack of pulmonary mass. Electronically Signed   By: Norva Pavlov M.D.   On: 07/31/2015 13:22   Dg Chest 2 View  07/05/2015  CLINICAL DATA:  Shortness of breath EXAM: CHEST  2 VIEW COMPARISON:  06/25/2015 chest radiograph. FINDINGS: Stable cardiomediastinal silhouette with mild cardiomegaly. No pneumothorax. No pleural effusion. Mild pulmonary edema. IMPRESSION: Mild congestive heart failure. Electronically Signed   By: Delbert Phenix M.D.   On: 07/05/2015 10:01   Dg Knee Complete 4 Views Left  07/24/2015  CLINICAL DATA:  82 year old female with fall and left knee pain. EXAM: LEFT KNEE - COMPLETE 4+ VIEW COMPARISON:  Radiograph dated 06/15/2011 FINDINGS: No definite acute fracture or dislocation identified, however evaluation is very limited due to advanced osteopenia and degenerative changes. The bones are osteopenic. There is severe osteoarthritic changes with tricompartmental narrowing and osteophyte formation. The knee is in flexed position. A small suprapatellar effusion noted. The soft tissues appear unremarkable. IMPRESSION: No definite acute fracture or dislocation. Osteopenia with severe arthritic changes. Electronically Signed   By: Elgie Collard M.D.   On: 07/24/2015 02:50   Dg Foot Complete Left  07/24/2015  CLINICAL DATA:  82 year old female with fall and left hip pain. EXAM: LEFT FOOT - COMPLETE 3+ VIEW COMPARISON:  None. FINDINGS: There is no acute fracture or dislocation. The bones are osteopenic. There is mild hallux valgus deformity. There is mild subcutaneous edema. The  soft tissues otherwise appear unremarkable. IMPRESSION: No acute fracture or dislocation. Electronically Signed   By: Elgie Collard M.D.   On: 07/24/2015 02:51   Dg Hip Unilat With Pelvis 2-3 Views Left  07/24/2015  CLINICAL DATA:  Worsening left hip pain after a fall yesterday. EXAM: DG HIP (WITH OR WITHOUT PELVIS) 2-3V LEFT COMPARISON:  None. FINDINGS: Severe degenerative changes in both hips, greater on the left. Left hip demonstrates deformity and sclerosis of the femoral head with superior subluxation and remodeling of the acetabulum. This could indicate old hip dysplasia or avascular necrosis. No evidence of acute fracture or dislocation. Visualized pelvis appears intact. Vascular calcifications. IMPRESSION: Severe degenerative changes in the left hip with deformities of the hip and acetabulum suggesting either old hip dysplasia or avascular necrosis. Aortic atherosclerosis. Electronically Signed   By: Burman Nieves M.D.   On: 07/24/2015 02:50    Discharge Exam: Filed Vitals:   08/01/15 2246 08/02/15 0508  BP: 128/65 114/61  Pulse: 63 55  Temp:  98.1 F (36.7 C)  Resp:  17   Filed Vitals:   08/01/15 1652 08/01/15 2113 08/01/15 2246 08/02/15 0508  BP: 122/67 119/51 128/65 114/61  Pulse: 61 62 63 55  Temp: 98 F (36.7 C) 98 F (36.7 C)  98.1 F (36.7 C)  TempSrc: Oral Oral  Oral  Resp: Height:      Weight:    48.49 kg (106 lb 14.4 oz)  SpO2: 100% 97%  98%    General: Pt is alert, follows commands appropriately, not in acute distress Cardiovascular: Regular rate and rhythm, S1/S2 +, no murmurs, no rubs, no gallops Respiratory: Clear to auscultation bilaterally, no wheezing, no crackles, no rhonchi Abdominal: Soft, non tender, non distended, bowel sounds +, no guarding Extremities: +1 bilateral edema, no cyanosis, pulses palpable bilaterally DP and PT Neuro: Grossly nonfocal  Discharge Instructions  Discharge Instructions    AMB Referral to [facility] Care  Management    Complete by:  As directed   Reason for consult:  Re-admission post hospital follow up, [employer],  Diagnoses of:  Heart Failure  Expected date of contact:  1-3 days (reserved for hospital discharges)  Please assign patient to social worker for post hospital follow up patient and family seeking more personal care hours, request has been made by MD. THIS IS patient's current address at her son's, [name] endorses that the patient is at his home [address] [place], [place] [address]. Please assign to community nurse for transition of care calls and assess for home visits. Questions please call:   Charlesetta Shanks, RN BSN CCM [facility]  [phone] business mobile phone Toll free office [phone]     Diet - low sodium heart healthy    Complete by:  As directed      Increase activity slowly    Complete by:  As directed             Medication List    TAKE these medications        acetaminophen 500 MG tablet  Commonly known as:  TYLENOL  Take 500 mg by mouth every 6 (six) hours as needed for mild pain.     albuterol 108 (90 Base) MCG/ACT inhaler  Commonly known as:  PROVENTIL HFA;VENTOLIN HFA  Inhale 2 puffs into the lungs every 6 (six) hours as needed for wheezing or shortness of breath.     allopurinol 100 MG tablet  Commonly known as:  ZYLOPRIM  Take 1 tablet (100 mg total) by mouth every morning.     BREO ELLIPTA 100-25 MCG/INH Aepb  Generic drug:  fluticasone furoate-vilanterol  Inhale 1 puff into the lungs daily as needed (for SOB).     diltiazem 120 MG 12 hr capsule  Commonly known as:  CARDIZEM SR  Take 1 capsule (120 mg total) by mouth 2 (two) times daily.     feeding supplement (ENSURE COMPLETE) Liqd  Take 237 mLs by mouth 2 (two) times daily between meals.     methimazole 5 MG tablet  Commonly known as:  TAPAZOLE  Take 5 mg by mouth every morning.     metoprolol tartrate 25 MG tablet  Commonly known as:  LOPRESSOR  Take 1  tablet (25 mg total) by mouth 2 (two) times daily.     OXYGEN  Inhale 2 L into the lungs continuous.     pantoprazole 40 MG tablet  Commonly known as:  PROTONIX  Take 1 tablet (40 mg total) by mouth daily.     senna 8.6 MG Tabs tablet  Commonly known as:  SENOKOT  Take 1 tablet (8.6 mg total) by mouth daily.     sodium bicarbonate 650 MG tablet  Take 1 tablet (650 mg total)  by mouth 3 (three) times daily.     traMADol 50 MG tablet  Commonly known as:  ULTRAM  Take 50-100 mg by mouth every 6 (six) hours as needed for moderate pain.     warfarin 3 MG tablet  Commonly known as:  COUMADIN  Take 1 tablet (3 mg total) by mouth daily at 6 PM. Take 1 1/2 tablets tonight and then resume 1 tablet (3 mg) once daily; follow-up with your doctor for recheck of your INR next week.           Follow-up Information    Follow up with [name], MD.   Specialty:  Internal Medicine   Contact information:   [address] [place] [address] [phone]        The results of significant diagnostics from this hospitalization (including imaging, microbiology, ancillary and laboratory) are listed below for reference.     Microbiology: No results found for this or any previous visit (from the past 240 hour(s)).   Labs: Basic Metabolic Panel:  Recent Labs Lab 07/31/15 1156 08/01/15 0344 08/02/15 0249  NA 138 139 138  K 4.5 4.4 4.9  CL 114* 110 107  CO2 19* 22 24  GLUCOSE 110* 106* 104*  BUN 40* 44* 52*  CREATININE 2.87* 3.00* 3.18*  CALCIUM 10.0 10.5* 10.1   Liver Function Tests: No results for input(s): AST, ALT, ALKPHOS, BILITOT, PROT, ALBUMIN in the last 168 hours. No results for input(s): LIPASE, AMYLASE in the last 168 hours. No results for input(s): AMMONIA in the last 168 hours. CBC:  Recent Labs Lab 07/31/15 1156 08/02/15 0249  WBC 3.9* 4.6  HGB 9.3* 9.2*  HCT 28.3* 29.0*  MCV 103.3* 102.8*  PLT 176 186   BNP (last 3  results)  Recent Labs  06/25/15 1308 07/05/15 0910 07/31/15 1156  BNP 644.0* 719.1* 1181.9*    SIGNED: Time coordinating discharge: 30 minutes  MAGICK-Venancio Chenier, MD  [facility] 08/02/2015, 9:21 AM Pager [phone]  If 7PM-7AM, please contact night-coverage [email or web address] Password [email or web address]

## 2015-08-04 ENCOUNTER — Encounter: Payer: Self-pay | Admitting: *Deleted

## 2015-08-04 ENCOUNTER — Other Ambulatory Visit: Payer: Self-pay

## 2015-08-04 DIAGNOSIS — I130 Hypertensive heart and chronic kidney disease with heart failure and stage 1 through stage 4 chronic kidney disease, or unspecified chronic kidney disease: Secondary | ICD-10-CM | POA: Diagnosis not present

## 2015-08-04 DIAGNOSIS — I5030 Unspecified diastolic (congestive) heart failure: Secondary | ICD-10-CM | POA: Diagnosis not present

## 2015-08-04 DIAGNOSIS — J441 Chronic obstructive pulmonary disease with (acute) exacerbation: Secondary | ICD-10-CM | POA: Diagnosis not present

## 2015-08-04 DIAGNOSIS — Z7952 Long term (current) use of systemic steroids: Secondary | ICD-10-CM | POA: Diagnosis not present

## 2015-08-04 DIAGNOSIS — Z7901 Long term (current) use of anticoagulants: Secondary | ICD-10-CM | POA: Diagnosis not present

## 2015-08-04 DIAGNOSIS — I82403 Acute embolism and thrombosis of unspecified deep veins of lower extremity, bilateral: Secondary | ICD-10-CM | POA: Diagnosis not present

## 2015-08-04 DIAGNOSIS — Z5181 Encounter for therapeutic drug level monitoring: Secondary | ICD-10-CM | POA: Diagnosis not present

## 2015-08-04 DIAGNOSIS — N184 Chronic kidney disease, stage 4 (severe): Secondary | ICD-10-CM | POA: Diagnosis not present

## 2015-08-04 DIAGNOSIS — E039 Hypothyroidism, unspecified: Secondary | ICD-10-CM | POA: Diagnosis not present

## 2015-08-04 DIAGNOSIS — E43 Unspecified severe protein-calorie malnutrition: Secondary | ICD-10-CM | POA: Diagnosis not present

## 2015-08-05 DIAGNOSIS — I825Y9 Chronic embolism and thrombosis of unspecified deep veins of unspecified proximal lower extremity: Secondary | ICD-10-CM | POA: Diagnosis not present

## 2015-08-05 DIAGNOSIS — E049 Nontoxic goiter, unspecified: Secondary | ICD-10-CM | POA: Diagnosis not present

## 2015-08-05 DIAGNOSIS — Z7901 Long term (current) use of anticoagulants: Secondary | ICD-10-CM | POA: Diagnosis not present

## 2015-08-05 DIAGNOSIS — I509 Heart failure, unspecified: Secondary | ICD-10-CM | POA: Diagnosis not present

## 2015-08-05 DIAGNOSIS — I10 Essential (primary) hypertension: Secondary | ICD-10-CM | POA: Diagnosis not present

## 2015-08-05 DIAGNOSIS — J449 Chronic obstructive pulmonary disease, unspecified: Secondary | ICD-10-CM | POA: Diagnosis not present

## 2015-08-05 NOTE — Patient Outreach (Signed)
[facility] Customer service manager [facility]) Care Management [facility] Community CM Telephone Outreach, Transition of Care Attempt # 1  08/04/2015  LOUISA MCCLENNEY 1933/12/04 [id number]  Attempted outreach to patient at [phone] without success. Left HIPAA compliant voicemail with RNCM contact information and invited callback.  [name] R. Alayjah Boehringer, RN, BSN, CCM [facility] Care Management Coordinator [phone]

## 2015-08-05 NOTE — Telephone Encounter (Signed)
lmtcb

## 2015-08-06 ENCOUNTER — Other Ambulatory Visit: Payer: Self-pay

## 2015-08-06 NOTE — Patient Outreach (Signed)
[facility] Customer service manager [facility]) Care Management [facility] Community CM Telephone Outreach, Transition of Care Attempt # 2   08/06/15  [name] Alexander 23-Apr-1933 [id number]   Received voicemail from patient's son, [name] Alexander on 08/05/2015, returning RNCM's call to patient. Outreach attempted with patient's son, [name] Alexander with no success. RNCM contact info provided and invited call back.  [name] R. Atharva Mirsky, RN, BSN, CCM [facility] Care Management Coordinator [phone]

## 2015-08-07 ENCOUNTER — Other Ambulatory Visit: Payer: Self-pay | Admitting: *Deleted

## 2015-08-07 DIAGNOSIS — Z7901 Long term (current) use of anticoagulants: Secondary | ICD-10-CM | POA: Diagnosis not present

## 2015-08-07 DIAGNOSIS — E43 Unspecified severe protein-calorie malnutrition: Secondary | ICD-10-CM | POA: Diagnosis not present

## 2015-08-07 DIAGNOSIS — I130 Hypertensive heart and chronic kidney disease with heart failure and stage 1 through stage 4 chronic kidney disease, or unspecified chronic kidney disease: Secondary | ICD-10-CM | POA: Diagnosis not present

## 2015-08-07 DIAGNOSIS — Z7952 Long term (current) use of systemic steroids: Secondary | ICD-10-CM | POA: Diagnosis not present

## 2015-08-07 DIAGNOSIS — Z5181 Encounter for therapeutic drug level monitoring: Secondary | ICD-10-CM | POA: Diagnosis not present

## 2015-08-07 DIAGNOSIS — J441 Chronic obstructive pulmonary disease with (acute) exacerbation: Secondary | ICD-10-CM | POA: Diagnosis not present

## 2015-08-07 DIAGNOSIS — E039 Hypothyroidism, unspecified: Secondary | ICD-10-CM | POA: Diagnosis not present

## 2015-08-07 DIAGNOSIS — N184 Chronic kidney disease, stage 4 (severe): Secondary | ICD-10-CM | POA: Diagnosis not present

## 2015-08-07 DIAGNOSIS — I5030 Unspecified diastolic (congestive) heart failure: Secondary | ICD-10-CM | POA: Diagnosis not present

## 2015-08-07 DIAGNOSIS — I82403 Acute embolism and thrombosis of unspecified deep veins of lower extremity, bilateral: Secondary | ICD-10-CM | POA: Diagnosis not present

## 2015-08-07 NOTE — Patient Outreach (Signed)
[facility]) Care Management  08/07/2015  DEVIN WILFERT 1933-12-25 [id number]  CSW received a new referral on patient from patient's RNCM with [facility] Management, Nigel Mormon, indicating that patient would benefit from social work services and resources to assist with obtaining additional PCS (Personal Care Services) in the home.  Mrs. Satterfield went on to say that patient currently resides with her son, Shaunelle Brys at his home in [place]. CSW made an initial attempt to try and contact patient and patient's son, Delonna Lessing today to perform phone assessment, as well as assess and assist with social needs and services, without success.  A HIPAA complaint message was left for both of them on voicemail.  CSW is currently awaiting a return call.  CSW will make another outreach attempt to patient and Mr. Antell within the next week. Danford Bad, BSW, MSW, LCSW  Licensed Restaurant manager, fast food [facility] System  Mailing [place] [address], [place], [place] [address] Physical Address-[address], [place], [place] [address] Toll Free Main # [phone] Fax # [phone] Cell # [phone]  Fax # [phone]  [email or web address]

## 2015-08-08 ENCOUNTER — Other Ambulatory Visit: Payer: Self-pay

## 2015-08-08 DIAGNOSIS — I5030 Unspecified diastolic (congestive) heart failure: Secondary | ICD-10-CM | POA: Diagnosis not present

## 2015-08-08 DIAGNOSIS — N184 Chronic kidney disease, stage 4 (severe): Secondary | ICD-10-CM | POA: Diagnosis not present

## 2015-08-08 DIAGNOSIS — I82403 Acute embolism and thrombosis of unspecified deep veins of lower extremity, bilateral: Secondary | ICD-10-CM | POA: Diagnosis not present

## 2015-08-08 DIAGNOSIS — E039 Hypothyroidism, unspecified: Secondary | ICD-10-CM | POA: Diagnosis not present

## 2015-08-08 DIAGNOSIS — Z7952 Long term (current) use of systemic steroids: Secondary | ICD-10-CM | POA: Diagnosis not present

## 2015-08-08 DIAGNOSIS — Z5181 Encounter for therapeutic drug level monitoring: Secondary | ICD-10-CM | POA: Diagnosis not present

## 2015-08-08 DIAGNOSIS — J441 Chronic obstructive pulmonary disease with (acute) exacerbation: Secondary | ICD-10-CM | POA: Diagnosis not present

## 2015-08-08 DIAGNOSIS — Z7901 Long term (current) use of anticoagulants: Secondary | ICD-10-CM | POA: Diagnosis not present

## 2015-08-08 DIAGNOSIS — E43 Unspecified severe protein-calorie malnutrition: Secondary | ICD-10-CM | POA: Diagnosis not present

## 2015-08-08 DIAGNOSIS — I130 Hypertensive heart and chronic kidney disease with heart failure and stage 1 through stage 4 chronic kidney disease, or unspecified chronic kidney disease: Secondary | ICD-10-CM | POA: Diagnosis not present

## 2015-08-08 NOTE — Patient Outreach (Signed)
[facility] Customer service manager [facility]) Care Management [facility] Community CM Telephone Outreach, Transition of Care Attempt # 3  08/08/15  [name] Alexander 1933-06-24 [id number]  Transition of care outreach #3 attempted without success. Left HIPAA compliant voicemail with RNCM contact information and invited calback. [name] R. Jennika Ringgold, RN, BSN, CCM [facility] Care Management Coordinator [phone]

## 2015-08-09 DIAGNOSIS — R269 Unspecified abnormalities of gait and mobility: Secondary | ICD-10-CM | POA: Diagnosis not present

## 2015-08-09 DIAGNOSIS — J449 Chronic obstructive pulmonary disease, unspecified: Secondary | ICD-10-CM | POA: Diagnosis not present

## 2015-08-11 ENCOUNTER — Other Ambulatory Visit: Payer: Self-pay

## 2015-08-11 DIAGNOSIS — E43 Unspecified severe protein-calorie malnutrition: Secondary | ICD-10-CM | POA: Diagnosis not present

## 2015-08-11 DIAGNOSIS — I82403 Acute embolism and thrombosis of unspecified deep veins of lower extremity, bilateral: Secondary | ICD-10-CM | POA: Diagnosis not present

## 2015-08-11 DIAGNOSIS — N184 Chronic kidney disease, stage 4 (severe): Secondary | ICD-10-CM | POA: Diagnosis not present

## 2015-08-11 DIAGNOSIS — Z7952 Long term (current) use of systemic steroids: Secondary | ICD-10-CM | POA: Diagnosis not present

## 2015-08-11 DIAGNOSIS — E039 Hypothyroidism, unspecified: Secondary | ICD-10-CM | POA: Diagnosis not present

## 2015-08-11 DIAGNOSIS — I130 Hypertensive heart and chronic kidney disease with heart failure and stage 1 through stage 4 chronic kidney disease, or unspecified chronic kidney disease: Secondary | ICD-10-CM | POA: Diagnosis not present

## 2015-08-11 DIAGNOSIS — I5030 Unspecified diastolic (congestive) heart failure: Secondary | ICD-10-CM | POA: Diagnosis not present

## 2015-08-11 DIAGNOSIS — J441 Chronic obstructive pulmonary disease with (acute) exacerbation: Secondary | ICD-10-CM | POA: Diagnosis not present

## 2015-08-11 DIAGNOSIS — Z5181 Encounter for therapeutic drug level monitoring: Secondary | ICD-10-CM | POA: Diagnosis not present

## 2015-08-11 DIAGNOSIS — Z7901 Long term (current) use of anticoagulants: Secondary | ICD-10-CM | POA: Diagnosis not present

## 2015-08-12 DIAGNOSIS — Z7952 Long term (current) use of systemic steroids: Secondary | ICD-10-CM | POA: Diagnosis not present

## 2015-08-12 DIAGNOSIS — J441 Chronic obstructive pulmonary disease with (acute) exacerbation: Secondary | ICD-10-CM | POA: Diagnosis not present

## 2015-08-12 DIAGNOSIS — I82403 Acute embolism and thrombosis of unspecified deep veins of lower extremity, bilateral: Secondary | ICD-10-CM | POA: Diagnosis not present

## 2015-08-12 DIAGNOSIS — N184 Chronic kidney disease, stage 4 (severe): Secondary | ICD-10-CM | POA: Diagnosis not present

## 2015-08-12 DIAGNOSIS — E43 Unspecified severe protein-calorie malnutrition: Secondary | ICD-10-CM | POA: Diagnosis not present

## 2015-08-12 DIAGNOSIS — I5030 Unspecified diastolic (congestive) heart failure: Secondary | ICD-10-CM | POA: Diagnosis not present

## 2015-08-12 DIAGNOSIS — I130 Hypertensive heart and chronic kidney disease with heart failure and stage 1 through stage 4 chronic kidney disease, or unspecified chronic kidney disease: Secondary | ICD-10-CM | POA: Diagnosis not present

## 2015-08-12 DIAGNOSIS — E039 Hypothyroidism, unspecified: Secondary | ICD-10-CM | POA: Diagnosis not present

## 2015-08-12 DIAGNOSIS — Z5181 Encounter for therapeutic drug level monitoring: Secondary | ICD-10-CM | POA: Diagnosis not present

## 2015-08-12 DIAGNOSIS — Z7901 Long term (current) use of anticoagulants: Secondary | ICD-10-CM | POA: Diagnosis not present

## 2015-08-13 ENCOUNTER — Other Ambulatory Visit: Payer: Self-pay | Admitting: *Deleted

## 2015-08-13 ENCOUNTER — Encounter: Payer: Self-pay | Admitting: *Deleted

## 2015-08-13 NOTE — Patient Outreach (Signed)
[name]) Care Management  08/13/2015  SUPRINA MANDEVILLE 12/23/1933 [id number]   CSW was able to make initial contact with patient's son, Jerolene Kupfer today to perform phone assessment on patient, as well as assess and assist with social work needs and services.  CSW introduced self, explained role and types of services provided through [place] [facility] ([place] [facility]).  CSW further explained to Mr. Barcelo that CSW works with patient's RNCM, also with [place] [facility], Tish Men. CSW then explained the reason for the call, indicating that Mrs. Satterfield thought that patient would benefit from social work services and resources to assist with increasing patient's hours of home care services.  CSW obtained two HIPAA compliant identifiers from Mr. Vigeant, which included patient's name and date of birth. CSW explained to Mr. Eisinger the process of trying to apply for additional home care service hours for patient.  Patient is currently residing with Mr. Digangi and his wife in their home, but is hopeful that she will be able to return to her own home and live independently, if home care service hours can be increased. Mr. Mathies has also been encouraged to converse with patient's current home care worker to request an application to be submitted to the home care agency that is currently providing services to patient.  Mr. Gu voiced understanding and was agreeable to this plan. CSW will perform a case closure on patient, as all goals of treatment have been met from social work standpoint and no additional social work needs have been identified at this time.  CSW will notify patient's RNCM with [place] [facility], Tish Men of CSW's plans to close patient's case .  CSW will fax an update to patient's Primary Care Physician, Dr. Jeanann Lewandowsky to ensure that they are aware of CSW's involvement with patient's plan of care.   CSW will submit a case closure request to Verlon Setting, Care Management Assistant with [place] [facility], in the form of an [employer].  CSW will ensure that Mrs. Comer is aware of Darlin Priestly, RNCM with [place] [facility], continued involvement with patient's care. Nat Christen, BSW, MSW, LCSW  Licensed Education officer, environmental [facility]  Mailing [place] [address] [place], [place] [address] Physical Address-[address], [place],  [address] Toll Free Main # [phone] Fax # [phone] Cell # [phone]  Fax # [phone]  [email or web address]

## 2015-08-14 NOTE — Patient Outreach (Signed)
08/11/2015  [name] Alexander 1933-12-06 [id number]  Successful outreach completed with patient's son, [name] Alexander. Patient identify verified.  Per patient's son, she is currently residing with him. Stated that she is retaining fluid and the extra weight is making it harder for her to breathe. [name] reported that one of the doctors told her that out of all her conditions, COPD would be the one that was going to take her out. Stated that this comment has bothered her and she is repeating it constantly and has been afraid to go to sleep. Stated that she does not want to be alone and that his niece has been staying with her (her granddaughter), but she is getting ready to go back to school. Stated that they do have an in home aide coming out for 2 hours per day, but need additional hours. Per [name], they had discussed this back in June with the doctor and he was under the impression that the time would be extended, but they have not heard anything since. Her current services are provided through [facility] and her aide is also [name]. Per [name], when she was discharged in June, she was given a prescription for Lasix (60-80 mg) and when that prescription ran out, he called in a refill and the pharmacy tried to get an authorization to refill it from the doctors at the hospital. He stated that they had also contacted Dr. Chestine Spore, but they never got the prescription authorized and she ran completely out. He stated that she went back into the hospital and they changed her order to Lasix 40 mg. He stated that he has been giving that to her and her breathing has been ok, but she does have swelling in her feet. Patient has been in for a follow up appointment with Dr. Chestine Spore last Wednesday and has another appointment scheduled for August 8. Stated that he is weighing her daily and she is staying right at 109. He stated that she is currently out of her Tramadol and is waiting for that to be filled. Stated  that she was given hydrocodone when she was in the hospital and that has helped her to sleep, reporting it makes her feel "peaceful." [name] currently has no other concerns at present. He is agreeable for a home visit for [facility] to fully assess patient.   Plan: RNCM will follow up next week with home visit to assess patient, identify additional needs and establish a plan of care.  [name] R. Leray Garverick, RN, BSN, CCM [facility] Care Management Coordinator [phone]

## 2015-08-18 DIAGNOSIS — I5030 Unspecified diastolic (congestive) heart failure: Secondary | ICD-10-CM | POA: Diagnosis not present

## 2015-08-18 DIAGNOSIS — Z5181 Encounter for therapeutic drug level monitoring: Secondary | ICD-10-CM | POA: Diagnosis not present

## 2015-08-18 DIAGNOSIS — Z7901 Long term (current) use of anticoagulants: Secondary | ICD-10-CM | POA: Diagnosis not present

## 2015-08-18 DIAGNOSIS — E039 Hypothyroidism, unspecified: Secondary | ICD-10-CM | POA: Diagnosis not present

## 2015-08-18 DIAGNOSIS — I82403 Acute embolism and thrombosis of unspecified deep veins of lower extremity, bilateral: Secondary | ICD-10-CM | POA: Diagnosis not present

## 2015-08-18 DIAGNOSIS — E43 Unspecified severe protein-calorie malnutrition: Secondary | ICD-10-CM | POA: Diagnosis not present

## 2015-08-18 DIAGNOSIS — J441 Chronic obstructive pulmonary disease with (acute) exacerbation: Secondary | ICD-10-CM | POA: Diagnosis not present

## 2015-08-18 DIAGNOSIS — I130 Hypertensive heart and chronic kidney disease with heart failure and stage 1 through stage 4 chronic kidney disease, or unspecified chronic kidney disease: Secondary | ICD-10-CM | POA: Diagnosis not present

## 2015-08-18 DIAGNOSIS — N184 Chronic kidney disease, stage 4 (severe): Secondary | ICD-10-CM | POA: Diagnosis not present

## 2015-08-18 DIAGNOSIS — Z7952 Long term (current) use of systemic steroids: Secondary | ICD-10-CM | POA: Diagnosis not present

## 2015-08-19 DIAGNOSIS — I4891 Unspecified atrial fibrillation: Secondary | ICD-10-CM | POA: Diagnosis not present

## 2015-08-19 DIAGNOSIS — I509 Heart failure, unspecified: Secondary | ICD-10-CM | POA: Diagnosis not present

## 2015-08-19 DIAGNOSIS — I825Y9 Chronic embolism and thrombosis of unspecified deep veins of unspecified proximal lower extremity: Secondary | ICD-10-CM | POA: Diagnosis not present

## 2015-08-19 DIAGNOSIS — J449 Chronic obstructive pulmonary disease, unspecified: Secondary | ICD-10-CM | POA: Diagnosis not present

## 2015-08-19 DIAGNOSIS — E049 Nontoxic goiter, unspecified: Secondary | ICD-10-CM | POA: Diagnosis not present

## 2015-08-19 DIAGNOSIS — Z7901 Long term (current) use of anticoagulants: Secondary | ICD-10-CM | POA: Diagnosis not present

## 2015-08-19 DIAGNOSIS — I10 Essential (primary) hypertension: Secondary | ICD-10-CM | POA: Diagnosis not present

## 2015-08-20 DIAGNOSIS — Z7901 Long term (current) use of anticoagulants: Secondary | ICD-10-CM | POA: Diagnosis not present

## 2015-08-20 DIAGNOSIS — I82403 Acute embolism and thrombosis of unspecified deep veins of lower extremity, bilateral: Secondary | ICD-10-CM | POA: Diagnosis not present

## 2015-08-20 DIAGNOSIS — E43 Unspecified severe protein-calorie malnutrition: Secondary | ICD-10-CM | POA: Diagnosis not present

## 2015-08-20 DIAGNOSIS — I5030 Unspecified diastolic (congestive) heart failure: Secondary | ICD-10-CM | POA: Diagnosis not present

## 2015-08-20 DIAGNOSIS — I130 Hypertensive heart and chronic kidney disease with heart failure and stage 1 through stage 4 chronic kidney disease, or unspecified chronic kidney disease: Secondary | ICD-10-CM | POA: Diagnosis not present

## 2015-08-20 DIAGNOSIS — J441 Chronic obstructive pulmonary disease with (acute) exacerbation: Secondary | ICD-10-CM | POA: Diagnosis not present

## 2015-08-20 DIAGNOSIS — N184 Chronic kidney disease, stage 4 (severe): Secondary | ICD-10-CM | POA: Diagnosis not present

## 2015-08-20 DIAGNOSIS — E039 Hypothyroidism, unspecified: Secondary | ICD-10-CM | POA: Diagnosis not present

## 2015-08-20 DIAGNOSIS — Z5181 Encounter for therapeutic drug level monitoring: Secondary | ICD-10-CM | POA: Diagnosis not present

## 2015-08-20 DIAGNOSIS — Z7952 Long term (current) use of systemic steroids: Secondary | ICD-10-CM | POA: Diagnosis not present

## 2015-08-21 ENCOUNTER — Other Ambulatory Visit: Payer: Self-pay

## 2015-08-25 DIAGNOSIS — I5030 Unspecified diastolic (congestive) heart failure: Secondary | ICD-10-CM | POA: Diagnosis not present

## 2015-08-25 DIAGNOSIS — J441 Chronic obstructive pulmonary disease with (acute) exacerbation: Secondary | ICD-10-CM | POA: Diagnosis not present

## 2015-08-25 DIAGNOSIS — M25562 Pain in left knee: Secondary | ICD-10-CM | POA: Diagnosis not present

## 2015-08-25 DIAGNOSIS — N184 Chronic kidney disease, stage 4 (severe): Secondary | ICD-10-CM | POA: Diagnosis not present

## 2015-08-25 DIAGNOSIS — E43 Unspecified severe protein-calorie malnutrition: Secondary | ICD-10-CM | POA: Diagnosis not present

## 2015-08-25 DIAGNOSIS — E039 Hypothyroidism, unspecified: Secondary | ICD-10-CM | POA: Diagnosis not present

## 2015-08-25 DIAGNOSIS — I130 Hypertensive heart and chronic kidney disease with heart failure and stage 1 through stage 4 chronic kidney disease, or unspecified chronic kidney disease: Secondary | ICD-10-CM | POA: Diagnosis not present

## 2015-08-25 DIAGNOSIS — I82403 Acute embolism and thrombosis of unspecified deep veins of lower extremity, bilateral: Secondary | ICD-10-CM | POA: Diagnosis not present

## 2015-08-25 DIAGNOSIS — Z5181 Encounter for therapeutic drug level monitoring: Secondary | ICD-10-CM | POA: Diagnosis not present

## 2015-08-25 DIAGNOSIS — Z7901 Long term (current) use of anticoagulants: Secondary | ICD-10-CM | POA: Diagnosis not present

## 2015-08-25 DIAGNOSIS — M25552 Pain in left hip: Secondary | ICD-10-CM | POA: Diagnosis not present

## 2015-08-25 DIAGNOSIS — Z7952 Long term (current) use of systemic steroids: Secondary | ICD-10-CM | POA: Diagnosis not present
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
## 2015-08-26 NOTE — Patient Outreach (Signed)
08/21/2015  [name] BowenLillie B Alexander 05-03-1933 [id number]  Successful outreach completed with Andres ShadVictoria DuBose at [facility] Advised of patient's family concerns about wanting to increase in-home aide hours. Per TurkeyVictoria, she had received the application from Mr. Uvaldo BristleBurch to increase hours on 08/11/2015 and faxed it to Dr. Chestine Sporelark. She stated that she received it back from Dr. Chestine Sporelark on 08/13/2015. She stated that she then [facility] on 08/18/2015. She continued to state that [facility] does the nursing assessments for Medicaid and would make the determination as to whether or not they are qualified to received additional in-home aide time. She stated that [facility]ine [facility] would actually be contacting the family to set up an assessment of [name] and they would know the determination before [facility] knows. She stated that it could take a few days to a couple of weeks to hear anything depending on when [place] schedules their home visit.

## 2015-08-27 DIAGNOSIS — E039 Hypothyroidism, unspecified: Secondary | ICD-10-CM | POA: Diagnosis not present

## 2015-08-27 DIAGNOSIS — Z7901 Long term (current) use of anticoagulants: Secondary | ICD-10-CM | POA: Diagnosis not present

## 2015-08-27 DIAGNOSIS — N184 Chronic kidney disease, stage 4 (severe): Secondary | ICD-10-CM | POA: Diagnosis not present

## 2015-08-27 DIAGNOSIS — E43 Unspecified severe protein-calorie malnutrition: Secondary | ICD-10-CM | POA: Diagnosis not present

## 2015-08-27 DIAGNOSIS — I82403 Acute embolism and thrombosis of unspecified deep veins of lower extremity, bilateral: Secondary | ICD-10-CM | POA: Diagnosis not present

## 2015-08-27 DIAGNOSIS — Z5181 Encounter for therapeutic drug level monitoring: Secondary | ICD-10-CM | POA: Diagnosis not present

## 2015-08-27 DIAGNOSIS — I5030 Unspecified diastolic (congestive) heart failure: Secondary | ICD-10-CM | POA: Diagnosis not present

## 2015-08-27 DIAGNOSIS — Z7952 Long term (current) use of systemic steroids: Secondary | ICD-10-CM | POA: Diagnosis not present

## 2015-08-27 DIAGNOSIS — I130 Hypertensive heart and chronic kidney disease with heart failure and stage 1 through stage 4 chronic kidney disease, or unspecified chronic kidney disease: Secondary | ICD-10-CM | POA: Diagnosis not present

## 2015-08-27 DIAGNOSIS — J441 Chronic obstructive pulmonary disease with (acute) exacerbation: Secondary | ICD-10-CM | POA: Diagnosis not present

## 2015-08-28 DIAGNOSIS — R531 Weakness: Secondary | ICD-10-CM | POA: Diagnosis not present

## 2015-08-29 ENCOUNTER — Other Ambulatory Visit: Payer: Self-pay

## 2015-08-29 NOTE — Patient Outreach (Signed)
[facility]) Care Management  08/29/15  [name] Alexander 1933-11-18 [id number]  Attempted to outreach patient without success. Patient's phone number listed is also her son, Stevenson's cell number. Left HIPAA compliant voicemail with RNCM contact information and invited callback.  TurkeyVictoria R. Kateryna Grantham, RN, BSN, CCM [facility] Care Management Coordinator [phone]) [phone]

## 2015-09-03 DIAGNOSIS — I130 Hypertensive heart and chronic kidney disease with heart failure and stage 1 through stage 4 chronic kidney disease, or unspecified chronic kidney disease: Secondary | ICD-10-CM | POA: Diagnosis not present

## 2015-09-03 DIAGNOSIS — F17210 Nicotine dependence, cigarettes, uncomplicated: Secondary | ICD-10-CM | POA: Diagnosis not present

## 2015-09-03 DIAGNOSIS — Z5181 Encounter for therapeutic drug level monitoring: Secondary | ICD-10-CM | POA: Diagnosis not present

## 2015-09-03 DIAGNOSIS — Z7901 Long term (current) use of anticoagulants: Secondary | ICD-10-CM | POA: Diagnosis not present

## 2015-09-03 DIAGNOSIS — E039 Hypothyroidism, unspecified: Secondary | ICD-10-CM | POA: Diagnosis not present

## 2015-09-03 DIAGNOSIS — I5030 Unspecified diastolic (congestive) heart failure: Secondary | ICD-10-CM | POA: Diagnosis not present

## 2015-09-03 DIAGNOSIS — I82403 Acute embolism and thrombosis of unspecified deep veins of lower extremity, bilateral: Secondary | ICD-10-CM | POA: Diagnosis not present

## 2015-09-03 DIAGNOSIS — J441 Chronic obstructive pulmonary disease with (acute) exacerbation: Secondary | ICD-10-CM | POA: Diagnosis not present

## 2015-09-03 DIAGNOSIS — N184 Chronic kidney disease, stage 4 (severe): Secondary | ICD-10-CM | POA: Diagnosis not present

## 2015-09-03 DIAGNOSIS — Z7952 Long term (current) use of systemic steroids: Secondary | ICD-10-CM | POA: Diagnosis not present

## 2015-09-03 DIAGNOSIS — E43 Unspecified severe protein-calorie malnutrition: Secondary | ICD-10-CM | POA: Diagnosis not present

## 2015-09-05 ENCOUNTER — Other Ambulatory Visit: Payer: Self-pay

## 2015-09-05 NOTE — Patient Outreach (Signed)
[facility]) Care Management  09/05/15  [name] Alexander 03-Oct-1933 [id number]  Attempted outreach to patient and her son, [name] without success. Left HIPAA compliant voicemail with RNCM contact information and invited callback.  TurkeyVictoria R. Azula Zappia, RN, BSN, CCM [facility] Care Management Coordinator [phone]) [phone]

## 2015-09-09 ENCOUNTER — Other Ambulatory Visit: Payer: Self-pay

## 2015-09-09 DIAGNOSIS — R269 Unspecified abnormalities of gait and mobility: Secondary | ICD-10-CM | POA: Diagnosis not present

## 2015-09-09 DIAGNOSIS — J449 Chronic obstructive pulmonary disease, unspecified: Secondary | ICD-10-CM | POA: Diagnosis not present

## 2015-09-09 NOTE — Patient Outreach (Signed)
[facility]) Care Management  09/09/15  [name] 02/06/1933 [id number]  Third and final outreach attempt completed without success on 09/09/2015. Left HIPAA compliant voicemail with RNCM contact information and requested callback.   Plan- RNCM will send outreach barrier letter and wait 10 business days before completing case closure if no return call has been received.  TurkeyVictoria R. Niquan Charnley, RN, BSN, CCM [facility] Care Management Coordinator [phone]) [phone]

## 2015-09-09 NOTE — Telephone Encounter (Signed)
Returned call to [facility] to verify labs from 07/25/15 (PT INR) scanned into system were the labs in question.  Patient has had repeat labs since initial phone call and [facility].

## 2015-09-10 DIAGNOSIS — I130 Hypertensive heart and chronic kidney disease with heart failure and stage 1 through stage 4 chronic kidney disease, or unspecified chronic kidney disease: Secondary | ICD-10-CM | POA: Diagnosis not present

## 2015-09-10 DIAGNOSIS — E43 Unspecified severe protein-calorie malnutrition: Secondary | ICD-10-CM | POA: Diagnosis not present

## 2015-09-10 DIAGNOSIS — Z7901 Long term (current) use of anticoagulants: Secondary | ICD-10-CM | POA: Diagnosis not present

## 2015-09-10 DIAGNOSIS — Z5181 Encounter for therapeutic drug level monitoring: Secondary | ICD-10-CM | POA: Diagnosis not present

## 2015-09-10 DIAGNOSIS — I82403 Acute embolism and thrombosis of unspecified deep veins of lower extremity, bilateral: Secondary | ICD-10-CM | POA: Diagnosis not present

## 2015-09-10 DIAGNOSIS — F17210 Nicotine dependence, cigarettes, uncomplicated: Secondary | ICD-10-CM | POA: Diagnosis not present

## 2015-09-10 DIAGNOSIS — Z7952 Long term (current) use of systemic steroids: Secondary | ICD-10-CM | POA: Diagnosis not present

## 2015-09-10 DIAGNOSIS — I5030 Unspecified diastolic (congestive) heart failure: Secondary | ICD-10-CM | POA: Diagnosis not present

## 2015-09-10 DIAGNOSIS — J441 Chronic obstructive pulmonary disease with (acute) exacerbation: Secondary | ICD-10-CM | POA: Diagnosis not present

## 2015-09-10 DIAGNOSIS — E039 Hypothyroidism, unspecified: Secondary | ICD-10-CM | POA: Diagnosis not present

## 2015-09-10 DIAGNOSIS — N184 Chronic kidney disease, stage 4 (severe): Secondary | ICD-10-CM | POA: Diagnosis not present

## 2015-09-12 DIAGNOSIS — Z7952 Long term (current) use of systemic steroids: Secondary | ICD-10-CM | POA: Diagnosis not present

## 2015-09-12 DIAGNOSIS — F17210 Nicotine dependence, cigarettes, uncomplicated: Secondary | ICD-10-CM | POA: Diagnosis not present

## 2015-09-12 DIAGNOSIS — E039 Hypothyroidism, unspecified: Secondary | ICD-10-CM | POA: Diagnosis not present

## 2015-09-12 DIAGNOSIS — Z5181 Encounter for therapeutic drug level monitoring: Secondary | ICD-10-CM | POA: Diagnosis not present

## 2015-09-12 DIAGNOSIS — N184 Chronic kidney disease, stage 4 (severe): Secondary | ICD-10-CM | POA: Diagnosis not present

## 2015-09-12 DIAGNOSIS — I82403 Acute embolism and thrombosis of unspecified deep veins of lower extremity, bilateral: Secondary | ICD-10-CM | POA: Diagnosis not present

## 2015-09-12 DIAGNOSIS — E43 Unspecified severe protein-calorie malnutrition: Secondary | ICD-10-CM | POA: Diagnosis not present

## 2015-09-12 DIAGNOSIS — J441 Chronic obstructive pulmonary disease with (acute) exacerbation: Secondary | ICD-10-CM | POA: Diagnosis not present

## 2015-09-12 DIAGNOSIS — Z7901 Long term (current) use of anticoagulants: Secondary | ICD-10-CM | POA: Diagnosis not present

## 2015-09-12 DIAGNOSIS — I130 Hypertensive heart and chronic kidney disease with heart failure and stage 1 through stage 4 chronic kidney disease, or unspecified chronic kidney disease: Secondary | ICD-10-CM | POA: Diagnosis not present

## 2015-09-12 DIAGNOSIS — I5030 Unspecified diastolic (congestive) heart failure: Secondary | ICD-10-CM | POA: Diagnosis not present

## 2015-09-16 DIAGNOSIS — I509 Heart failure, unspecified: Secondary | ICD-10-CM | POA: Diagnosis not present

## 2015-09-16 DIAGNOSIS — E049 Nontoxic goiter, unspecified: Secondary | ICD-10-CM | POA: Diagnosis not present

## 2015-09-16 DIAGNOSIS — I825Y9 Chronic embolism and thrombosis of unspecified deep veins of unspecified proximal lower extremity: Secondary | ICD-10-CM | POA: Diagnosis not present

## 2015-09-16 DIAGNOSIS — J449 Chronic obstructive pulmonary disease, unspecified: Secondary | ICD-10-CM | POA: Diagnosis not present

## 2015-09-17 DIAGNOSIS — I130 Hypertensive heart and chronic kidney disease with heart failure and stage 1 through stage 4 chronic kidney disease, or unspecified chronic kidney disease: Secondary | ICD-10-CM | POA: Diagnosis not present

## 2015-09-17 DIAGNOSIS — Z5181 Encounter for therapeutic drug level monitoring: Secondary | ICD-10-CM | POA: Diagnosis not present

## 2015-09-17 DIAGNOSIS — Z7901 Long term (current) use of anticoagulants: Secondary | ICD-10-CM | POA: Diagnosis not present

## 2015-09-17 DIAGNOSIS — F17210 Nicotine dependence, cigarettes, uncomplicated: Secondary | ICD-10-CM | POA: Diagnosis not present

## 2015-09-17 DIAGNOSIS — Z7952 Long term (current) use of systemic steroids: Secondary | ICD-10-CM | POA: Diagnosis not present

## 2015-09-17 DIAGNOSIS — I5030 Unspecified diastolic (congestive) heart failure: Secondary | ICD-10-CM | POA: Diagnosis not present

## 2015-09-17 DIAGNOSIS — E039 Hypothyroidism, unspecified: Secondary | ICD-10-CM | POA: Diagnosis not present

## 2015-09-17 DIAGNOSIS — I82403 Acute embolism and thrombosis of unspecified deep veins of lower extremity, bilateral: Secondary | ICD-10-CM | POA: Diagnosis not present

## 2015-09-17 DIAGNOSIS — J441 Chronic obstructive pulmonary disease with (acute) exacerbation: Secondary | ICD-10-CM | POA: Diagnosis not present

## 2015-09-17 DIAGNOSIS — E43 Unspecified severe protein-calorie malnutrition: Secondary | ICD-10-CM | POA: Diagnosis not present

## 2015-09-17 DIAGNOSIS — Z0101 Encounter for examination of eyes and vision with abnormal findings: Secondary | ICD-10-CM | POA: Diagnosis not present

## 2015-09-17 DIAGNOSIS — N184 Chronic kidney disease, stage 4 (severe): Secondary | ICD-10-CM | POA: Diagnosis not present

## 2015-09-24 DIAGNOSIS — E039 Hypothyroidism, unspecified: Secondary | ICD-10-CM | POA: Diagnosis not present

## 2015-09-24 DIAGNOSIS — Z7952 Long term (current) use of systemic steroids: Secondary | ICD-10-CM | POA: Diagnosis not present

## 2015-09-24 DIAGNOSIS — I82403 Acute embolism and thrombosis of unspecified deep veins of lower extremity, bilateral: Secondary | ICD-10-CM | POA: Diagnosis not present

## 2015-09-24 DIAGNOSIS — I130 Hypertensive heart and chronic kidney disease with heart failure and stage 1 through stage 4 chronic kidney disease, or unspecified chronic kidney disease: Secondary | ICD-10-CM | POA: Diagnosis not present

## 2015-09-24 DIAGNOSIS — Z7901 Long term (current) use of anticoagulants: Secondary | ICD-10-CM | POA: Diagnosis not present

## 2015-09-24 DIAGNOSIS — N184 Chronic kidney disease, stage 4 (severe): Secondary | ICD-10-CM | POA: Diagnosis not present

## 2015-09-24 DIAGNOSIS — J441 Chronic obstructive pulmonary disease with (acute) exacerbation: Secondary | ICD-10-CM | POA: Diagnosis not present

## 2015-09-24 DIAGNOSIS — E43 Unspecified severe protein-calorie malnutrition: Secondary | ICD-10-CM | POA: Diagnosis not present

## 2015-09-24 DIAGNOSIS — I5030 Unspecified diastolic (congestive) heart failure: Secondary | ICD-10-CM | POA: Diagnosis not present

## 2015-09-24 DIAGNOSIS — Z5181 Encounter for therapeutic drug level monitoring: Secondary | ICD-10-CM | POA: Diagnosis not present

## 2015-09-28 ENCOUNTER — Inpatient Hospital Stay (HOSPITAL_COMMUNITY)
Admission: EM | Admit: 2015-09-28 | Discharge: 2015-09-30 | DRG: 190 | Disposition: A | Discharge status: Home Health | Payer: Medicare Other | Attending: Internal Medicine | Admitting: Internal Medicine

## 2015-09-28 ENCOUNTER — Encounter (HOSPITAL_COMMUNITY): Payer: Self-pay

## 2015-09-28 ENCOUNTER — Emergency Department (HOSPITAL_COMMUNITY): Payer: Medicare Other

## 2015-09-28 DIAGNOSIS — R0602 Shortness of breath: Secondary | ICD-10-CM | POA: Diagnosis not present

## 2015-09-28 DIAGNOSIS — Z87891 Personal history of nicotine dependence: Secondary | ICD-10-CM | POA: Diagnosis not present

## 2015-09-28 DIAGNOSIS — M109 Gout, unspecified: Secondary | ICD-10-CM | POA: Diagnosis present

## 2015-09-28 DIAGNOSIS — K219 Gastro-esophageal reflux disease without esophagitis: Secondary | ICD-10-CM | POA: Diagnosis not present

## 2015-09-28 DIAGNOSIS — I5042 Chronic combined systolic (congestive) and diastolic (congestive) heart failure: Secondary | ICD-10-CM | POA: Diagnosis present

## 2015-09-28 DIAGNOSIS — Z96651 Presence of right artificial knee joint: Secondary | ICD-10-CM | POA: Diagnosis not present

## 2015-09-28 DIAGNOSIS — D631 Anemia in chronic kidney disease: Secondary | ICD-10-CM | POA: Diagnosis not present

## 2015-09-28 DIAGNOSIS — J189 Pneumonia, unspecified organism: Secondary | ICD-10-CM | POA: Diagnosis not present

## 2015-09-28 DIAGNOSIS — Z23 Encounter for immunization: Secondary | ICD-10-CM

## 2015-09-28 DIAGNOSIS — J440 Chronic obstructive pulmonary disease with acute lower respiratory infection: Principal | ICD-10-CM | POA: Diagnosis present

## 2015-09-28 DIAGNOSIS — E440 Moderate protein-calorie malnutrition: Secondary | ICD-10-CM | POA: Diagnosis present

## 2015-09-28 DIAGNOSIS — Z79899 Other long term (current) drug therapy: Secondary | ICD-10-CM

## 2015-09-28 DIAGNOSIS — I10 Essential (primary) hypertension: Secondary | ICD-10-CM | POA: Diagnosis present

## 2015-09-28 DIAGNOSIS — I2510 Atherosclerotic heart disease of native coronary artery without angina pectoris: Secondary | ICD-10-CM | POA: Diagnosis present

## 2015-09-28 DIAGNOSIS — Z6820 Body mass index (BMI) 20.0-20.9, adult: Secondary | ICD-10-CM | POA: Diagnosis not present

## 2015-09-28 DIAGNOSIS — Y95 Nosocomial condition: Secondary | ICD-10-CM | POA: Diagnosis present

## 2015-09-28 DIAGNOSIS — Z7901 Long term (current) use of anticoagulants: Secondary | ICD-10-CM | POA: Diagnosis not present

## 2015-09-28 DIAGNOSIS — I130 Hypertensive heart and chronic kidney disease with heart failure and stage 1 through stage 4 chronic kidney disease, or unspecified chronic kidney disease: Secondary | ICD-10-CM | POA: Diagnosis present

## 2015-09-28 DIAGNOSIS — J9601 Acute respiratory failure with hypoxia: Secondary | ICD-10-CM | POA: Diagnosis present

## 2015-09-28 DIAGNOSIS — Z9981 Dependence on supplemental oxygen: Secondary | ICD-10-CM | POA: Diagnosis not present

## 2015-09-28 DIAGNOSIS — N183 Chronic kidney disease, stage 3 (moderate): Secondary | ICD-10-CM | POA: Diagnosis not present

## 2015-09-28 DIAGNOSIS — N184 Chronic kidney disease, stage 4 (severe): Secondary | ICD-10-CM | POA: Diagnosis present

## 2015-09-28 DIAGNOSIS — I482 Chronic atrial fibrillation: Secondary | ICD-10-CM | POA: Diagnosis not present

## 2015-09-28 DIAGNOSIS — I051 Rheumatic mitral insufficiency: Secondary | ICD-10-CM | POA: Diagnosis not present

## 2015-09-28 DIAGNOSIS — J441 Chronic obstructive pulmonary disease with (acute) exacerbation: Secondary | ICD-10-CM | POA: Diagnosis present

## 2015-09-28 DIAGNOSIS — R609 Edema, unspecified: Secondary | ICD-10-CM | POA: Diagnosis present

## 2015-09-28 DIAGNOSIS — I480 Paroxysmal atrial fibrillation: Secondary | ICD-10-CM | POA: Diagnosis not present

## 2015-09-28 DIAGNOSIS — I5023 Acute on chronic systolic (congestive) heart failure: Secondary | ICD-10-CM | POA: Diagnosis not present

## 2015-09-28 DIAGNOSIS — E0590 Thyrotoxicosis, unspecified without thyrotoxic crisis or storm: Secondary | ICD-10-CM | POA: Diagnosis not present

## 2015-09-28 DIAGNOSIS — Z86718 Personal history of other venous thrombosis and embolism: Secondary | ICD-10-CM

## 2015-09-28 DIAGNOSIS — Z681 Body mass index (BMI) 19 or less, adult: Secondary | ICD-10-CM

## 2015-09-28 LAB — BASIC METABOLIC PANEL
Anion gap: 9 (ref 5–15)
BUN: 45 mg/dL — AB (ref 6–20)
CHLORIDE: 115 mmol/L — AB (ref 101–111)
CO2: 17 mmol/L — ABNORMAL LOW (ref 22–32)
Calcium: 10.4 mg/dL — ABNORMAL HIGH (ref 8.9–10.3)
Creatinine, Ser: 2.96 mg/dL — ABNORMAL HIGH (ref 0.44–1.00)
GFR calc Af Amer: 16 mL/min — ABNORMAL LOW (ref >60)
GFR calc non Af Amer: 14 mL/min — ABNORMAL LOW (ref >60)
GLUCOSE: 103 mg/dL — AB (ref 65–99)
POTASSIUM: 4.3 mmol/L (ref 3.5–5.1)
Sodium: 141 mmol/L (ref 135–145)

## 2015-09-28 LAB — PROTIME-INR
INR: 2.44
PROTHROMBIN TIME: 26.9 s — AB (ref 11.4–15.2)

## 2015-09-28 LAB — I-STAT CG4 LACTIC ACID, ED
Lactic Acid, Venous: 1.05 mmol/L (ref 0.5–1.9)
Lactic Acid, Venous: 1.06 mmol/L (ref 0.5–1.9)

## 2015-09-28 LAB — CBC
HCT: 30.8 % — ABNORMAL LOW (ref 36.0–46.0)
Hemoglobin: 9.6 g/dL — ABNORMAL LOW (ref 12.0–15.0)
MCH: 33.6 pg (ref 26.0–34.0)
MCHC: 31.2 g/dL (ref 30.0–36.0)
MCV: 107.7 fL — ABNORMAL HIGH (ref 78.0–100.0)
Platelets: 188 10*3/uL (ref 150–400)
RBC: 2.86 MIL/uL — ABNORMAL LOW (ref 3.87–5.11)
RDW: 16.3 % — ABNORMAL HIGH (ref 11.5–15.5)
WBC: 3.9 10*3/uL — ABNORMAL LOW (ref 4.0–10.5)

## 2015-09-28 LAB — BRAIN NATRIURETIC PEPTIDE: B NATRIURETIC PEPTIDE 5: 625.4 pg/mL — AB (ref 0.0–100.0)

## 2015-09-28 LAB — TSH: TSH: 2.745 u[IU]/mL (ref 0.350–4.500)

## 2015-09-28 LAB — I-STAT TROPONIN, ED: Troponin i, poc: 0.04 ng/mL (ref 0.00–0.08)

## 2015-09-28 LAB — PHOSPHORUS: PHOSPHORUS: 3.5 mg/dL (ref 2.5–4.6)

## 2015-09-28 LAB — TROPONIN I: Troponin I: <0.03 ng/mL (ref <0.03)

## 2015-09-28 LAB — MAGNESIUM: Magnesium: 2 mg/dL (ref 1.7–2.4)

## 2015-09-28 MED ORDER — TRAMADOL HCL 50 MG PO TABS
50.0000 mg | ORAL_TABLET | Freq: Two times a day (BID) | ORAL | Status: DC | PRN
  Administered 2015-09-28: 100 mg via ORAL
  Administered 2015-09-29 (×2): 50 mg via ORAL
  Administered 2015-09-30: 100 mg via ORAL
  Administered 2015-09-30: 50 mg via ORAL
  Filled 2015-09-28 (×2): qty 2
  Filled 2015-09-28: qty 1
  Filled 2015-09-28: qty 2
  Filled 2015-09-28 (×2): qty 1

## 2015-09-28 MED ORDER — INFLUENZA VAC SPLIT QUAD 0.5 ML IM SUSY
0.5000 mL | PREFILLED_SYRINGE | INTRAMUSCULAR | Status: AC
  Administered 2015-09-29: 0.5 mL via INTRAMUSCULAR
  Filled 2015-09-28: qty 0.5

## 2015-09-28 MED ORDER — METHIMAZOLE 5 MG PO TABS
5.0000 mg | ORAL_TABLET | Freq: Every morning | ORAL | Status: DC
  Administered 2015-09-29 – 2015-09-30 (×2): 5 mg via ORAL
  Filled 2015-09-28 (×2): qty 1

## 2015-09-28 MED ORDER — FLUTICASONE FUROATE-VILANTEROL 100-25 MCG/INH IN AEPB
1.0000 | INHALATION_SPRAY | Freq: Every day | RESPIRATORY_TRACT | Status: DC | PRN
  Filled 2015-09-28: qty 28

## 2015-09-28 MED ORDER — WARFARIN SODIUM 3 MG PO TABS
3.0000 mg | ORAL_TABLET | Freq: Once | ORAL | Status: AC
  Administered 2015-09-28: 3 mg via ORAL
  Filled 2015-09-28: qty 1

## 2015-09-28 MED ORDER — ACETAMINOPHEN 650 MG RE SUPP: 650.0000 mg | Freq: Four times a day (QID) | RECTAL | Status: DC | PRN

## 2015-09-28 MED ORDER — SENNA 8.6 MG PO TABS
1.0000 | ORAL_TABLET | Freq: Every day | ORAL | Status: DC
  Administered 2015-09-29 – 2015-09-30 (×2): 8.6 mg via ORAL
  Filled 2015-09-28 (×2): qty 1

## 2015-09-28 MED ORDER — METOPROLOL TARTRATE 25 MG PO TABS
25.0000 mg | ORAL_TABLET | Freq: Two times a day (BID) | ORAL | Status: DC
  Administered 2015-09-28 – 2015-09-30 (×4): 25 mg via ORAL
  Filled 2015-09-28 (×4): qty 1

## 2015-09-28 MED ORDER — SODIUM CHLORIDE 0.9 % IV SOLN: 250.0000 mL | INTRAVENOUS | Status: DC | PRN

## 2015-09-28 MED ORDER — DEXTROSE 5 % IV SOLN
1.0000 g | Freq: Once | INTRAVENOUS | Status: AC
  Administered 2015-09-28: 1 g via INTRAVENOUS
  Filled 2015-09-28: qty 1

## 2015-09-28 MED ORDER — SODIUM CHLORIDE 0.9% FLUSH
3.0000 mL | INTRAVENOUS | Status: DC | PRN
Start: 2015-09-28 — End: 2015-09-30

## 2015-09-28 MED ORDER — SODIUM CHLORIDE 0.9% FLUSH
3.0000 mL | Freq: Two times a day (BID) | INTRAVENOUS | Status: DC
  Administered 2015-09-29 – 2015-09-30 (×3): 3 mL via INTRAVENOUS

## 2015-09-28 MED ORDER — ONDANSETRON HCL 4 MG PO TABS: 4.0000 mg | ORAL_TABLET | Freq: Four times a day (QID) | ORAL | Status: DC | PRN

## 2015-09-28 MED ORDER — DEXTROSE 5 % IV SOLN
1.0000 g | INTRAVENOUS | Status: DC
  Administered 2015-09-29 – 2015-09-30 (×2): 1 g via INTRAVENOUS
  Filled 2015-09-28 (×2): qty 1

## 2015-09-28 MED ORDER — WARFARIN - PHARMACIST DOSING INPATIENT
Freq: Every day | Status: DC
  Administered 2015-09-29: 18:00:00

## 2015-09-28 MED ORDER — FUROSEMIDE 40 MG PO TABS
60.0000 mg | ORAL_TABLET | Freq: Three times a day (TID) | ORAL | Status: DC
  Administered 2015-09-28 – 2015-09-30 (×6): 60 mg via ORAL
  Filled 2015-09-28 (×6): qty 1

## 2015-09-28 MED ORDER — LEVALBUTEROL HCL 0.63 MG/3ML IN NEBU: 0.6300 mg | INHALATION_SOLUTION | Freq: Four times a day (QID) | RESPIRATORY_TRACT | Status: DC | PRN

## 2015-09-28 MED ORDER — DILTIAZEM HCL ER 60 MG PO CP12
120.0000 mg | ORAL_CAPSULE | Freq: Two times a day (BID) | ORAL | Status: DC
  Administered 2015-09-28 – 2015-09-30 (×4): 120 mg via ORAL
  Filled 2015-09-28 (×5): qty 2

## 2015-09-28 MED ORDER — VANCOMYCIN HCL IN DEXTROSE 750-5 MG/150ML-% IV SOLN
750.0000 mg | INTRAVENOUS | Status: DC
  Administered 2015-09-28: 750 mg via INTRAVENOUS
  Filled 2015-09-28 (×2): qty 150

## 2015-09-28 MED ORDER — ONDANSETRON HCL 4 MG/2ML IJ SOLN: 4.0000 mg | Freq: Four times a day (QID) | INTRAMUSCULAR | Status: DC | PRN

## 2015-09-28 MED ORDER — ACETAMINOPHEN 325 MG PO TABS
650.0000 mg | ORAL_TABLET | Freq: Four times a day (QID) | ORAL | Status: DC | PRN
  Administered 2015-09-29 – 2015-09-30 (×5): 650 mg via ORAL
  Filled 2015-09-28 (×5): qty 2

## 2015-09-28 MED ORDER — ACETAMINOPHEN 325 MG PO TABS
650.0000 mg | ORAL_TABLET | Freq: Once | ORAL | Status: AC
  Administered 2015-09-28: 650 mg via ORAL
  Filled 2015-09-28: qty 2

## 2015-09-28 MED ORDER — ALLOPURINOL 100 MG PO TABS
100.0000 mg | ORAL_TABLET | Freq: Every morning | ORAL | Status: DC
  Administered 2015-09-29 – 2015-09-30 (×2): 100 mg via ORAL
  Filled 2015-09-28 (×2): qty 1

## 2015-09-28 MED ORDER — SODIUM CHLORIDE 0.9% FLUSH
3.0000 mL | Freq: Two times a day (BID) | INTRAVENOUS | Status: DC
  Administered 2015-09-29 – 2015-09-30 (×2): 3 mL via INTRAVENOUS

## 2015-09-28 MED ORDER — SODIUM BICARBONATE 650 MG PO TABS
650.0000 mg | ORAL_TABLET | Freq: Three times a day (TID) | ORAL | Status: DC
  Administered 2015-09-28 – 2015-09-29 (×4): 650 mg via ORAL
  Filled 2015-09-28 (×4): qty 1

## 2015-09-28 MED ORDER — DILTIAZEM HCL ER 60 MG PO CP12: 120.0000 mg | ORAL_CAPSULE | Freq: Two times a day (BID) | ORAL | Status: DC

## 2015-09-28 MED ORDER — PANTOPRAZOLE SODIUM 40 MG PO TBEC
40.0000 mg | DELAYED_RELEASE_TABLET | Freq: Every day | ORAL | Status: DC
  Administered 2015-09-29: 40 mg via ORAL
  Filled 2015-09-28: qty 1

## 2015-09-28 NOTE — Progress Notes (Addendum)
Pharmacy Antibiotic Note  [name] Alexander is a 82 y.o. female admitted on 09/28/2015 with pneumonia.  Pharmacy has been consulted for vancomycin dosing.  Scr elevated, est CrCl ~ 13 ml/min (close to baseline Scr)  Plan: 1. Vancomycin 750 mg IV q 48 hrs for now. 2. F/u renal function. 3. F/u plans to continue cefepime?  Height: 5\' 4"  (162.6 cm) Weight: 121 lb (54.9 kg) IBW/kg (Calculated) : 54.7  Temp (24hrs), Avg:98.2 F (36.8 C), Min:98.2 F (36.8 C), Max:98.2 F (36.8 C)   Recent Labs Lab 09/28/15 1145 09/28/15 1523  WBC 3.9*  --   CREATININE 2.96*  --   LATICACIDVEN  --  1.05    Estimated Creatinine Clearance: 13.1 mL/min (by C-G formula based on SCr of 2.96 mg/dL (H)).    No Known Allergies  Antimicrobials this admission: Vancomycin 9/17 >>  Cefepime 9/17 x 1 cont per pharmacy  Dose adjustments this admission:   Microbiology results: * BCx:  * UCx:  * Sputum:  * MRSA PCR:  Thank you for allowing pharmacy to be a part of this patient's care.  Tad MooreJessica Carney, Pharm D, BCPS  Clinical Pharmacist Pager [phone]  09/28/2015 4:15 PM   Addendum  Cont cefepime per MD.  Cefepime 1g IV q24  Ulyses SouthwardMinh Pham, PharmD Pager: [phone] 09/28/2015 6:06 PM

## 2015-09-28 NOTE — ED Notes (Signed)
IV attempted x1, unsuccessful.  

## 2015-09-28 NOTE — ED Notes (Signed)
Pt complaining of left leg pain. Informed Dr. Rush Landmarkegeler.

## 2015-09-28 NOTE — ED Provider Notes (Signed)
[facility]-EMERGENCY DEPT Provider Note   CSN: [id number] Arrival date & time: 09/28/15  1114    History   Chief Complaint Chief Complaint  Patient presents with  . Weight Gain    HPI [name] Alexander is a pleasant 82 y.o. female with past medical history significant for CAD, CHF, GERD, DVT, hypertension, and history of pneumonia who presents with chills, fatigue, decreased mobility, shortness of breath, cough, and leg swelling. Patient is currently by family report that last week, patient has had increase in swelling in her bilateral lower extremity. Patient also had increase in nonproductive cough. Patient says that she is still on her home oxygen requirement however, she has had increase in subjective shortness of breath. She reports no documented fevers but does report feeling chills. Patient denies chest pain, nausea, vomiting, constipation, diarrhea, or dysuria. Patient reports that her primary team has been changing her diuresis regimen recently.   The history is provided by the patient and a relative. No language interpreter was used.  Shortness of Breath  This is a new problem. The average episode lasts 1 week. The problem occurs continuously.The current episode started more than 2 days ago. The problem has been gradually worsening. Associated symptoms include cough, leg pain and leg swelling. Pertinent negatives include no fever, no headaches, no rhinorrhea, no sputum production, no wheezing, no chest pain, no syncope, no vomiting and no abdominal pain. She has tried nothing for the symptoms. She has had prior hospitalizations. Associated medical issues include heart failure and DVT (hx of).      Past Medical History:  Diagnosis Date  . Arthritis    "hips, knees, shoulders" (07/31/2015)  . Chronic diastolic CHF (congestive heart failure), NYHA class 2 (HCC)   . CKD (chronic kidney disease), stage IV (HCC)   . COPD (chronic obstructive pulmonary disease) (HCC)   . DVT (deep venous  thrombosis) (HCC) "2-3 times"  . GERD (gastroesophageal reflux disease)   . Gout   . Hypertension   . Hyperthyroidism   . On home oxygen therapy    "2L; 24/7" (07/31/2015)  . Pneumonia   . Thyroid disease     Patient Active Problem List   Diagnosis Date Noted  . Acute on chronic systolic congestive heart failure (HCC)   . CHF (congestive heart failure) (HCC) 07/31/2015  . Goals of care, counseling/discussion   . Palliative care encounter   . Acute on chronic congestive heart failure (HCC)   . Atrial fibrillation (HCC)   . Acute on chronic combined systolic and diastolic heart failure (HCC) 07/05/2015  . COPD (chronic obstructive pulmonary disease) (HCC) 05/17/2015  . Chronic combined systolic and diastolic CHF (congestive heart failure) (HCC) 05/17/2015  . Protein-calorie malnutrition, severe 03/20/2015  . Dyspnea 03/18/2015  . COPD exacerbation (HCC) 03/18/2015  . CKD (chronic kidney disease), stage IV (HCC) 03/18/2015  . Acute respiratory failure with hypoxia (HCC) 03/18/2015  . Tobacco abuse 03/18/2015  . DVT, bilateral lower limbs (HCC)   . Fluid overload 12/12/2013  . CKD (chronic kidney disease) stage 4, GFR 15-29 ml/min (HCC) 12/12/2013  . Normocytic anemia 12/12/2013  . Edema 12/12/2013  . CKD (chronic kidney disease), stage III 06/12/2011  . Community acquired pneumonia 06/11/2011  . ARF (acute renal failure) (HCC) 06/11/2011  . Hypercalcemia 06/11/2011  . Hyperthyroidism 06/11/2011  . Glaucoma 06/11/2011  . DVT (deep venous thrombosis) (HCC) 06/11/2011  . A-fib (HCC) 06/11/2011  . Hypertension   . Gout     Past Surgical History:  Procedure  Laterality Date  . CATARACT EXTRACTION W/ INTRAOCULAR LENS  IMPLANT, BILATERAL Bilateral   . JOINT REPLACEMENT    . TOTAL KNEE ARTHROPLASTY Right 06/2004   [name] 05/26/2010  . VAGINAL HYSTERECTOMY      OB History    No data available       Home Medications    Prior to Admission medications   Medication Sig  Start Date End Date Taking? Authorizing Provider  acetaminophen (TYLENOL) 500 MG tablet Take 500 mg by mouth every 6 (six) hours as needed for mild pain.    Historical Provider, MD  albuterol (PROVENTIL HFA;VENTOLIN HFA) 108 (90 Base) MCG/ACT inhaler Inhale 2 puffs into the lungs every 6 (six) hours as needed for wheezing or shortness of breath. 05/16/15   Mihai Croitoru, MD  allopurinol (ZYLOPRIM) 100 MG tablet Take 1 tablet (100 mg total) by mouth every morning. 07/10/15   Jonah Blue, MD  diltiazem (CARDIZEM SR) 120 MG 12 hr capsule Take 1 capsule (120 mg total) by mouth 2 (two) times daily. 06/02/15   Mihai Croitoru, MD  feeding supplement, ENSURE COMPLETE, (ENSURE COMPLETE) LIQD Take 237 mLs by mouth 2 (two) times daily between meals. Patient taking differently: Take 237 mLs by mouth daily.  12/18/13   Nishant Dhungel, MD  fluticasone furoate-vilanterol (BREO ELLIPTA) 100-25 MCG/INH AEPB Inhale 1 puff into the lungs daily as needed (for SOB).     Historical Provider, MD  furosemide (LASIX) 40 MG tablet Take 40 mg by mouth 2 (two) times daily.    [name], MD  methimazole (TAPAZOLE) 5 MG tablet Take 5 mg by mouth every morning.     Historical Provider, MD  metoprolol tartrate (LOPRESSOR) 25 MG tablet Take 1 tablet (25 mg total) by mouth 2 (two) times daily. 07/10/15   Jonah Blue, MD  oxycodone (OXY-IR) 5 MG capsule Take 5 mg by mouth every 4 (four) hours as needed (shortness of breat). Take 1/2 tablet every 12 hours as needed for shortness of breath 07/10/15   Stephani Police, PA-C  OXYGEN Inhale 2 L into the lungs continuous.    Historical Provider, MD  pantoprazole (PROTONIX) 40 MG tablet Take 1 tablet (40 mg total) by mouth daily. 07/10/15   Jonah Blue, MD  senna (SENOKOT) 8.6 MG TABS tablet Take 1 tablet (8.6 mg total) by mouth daily. 07/10/15   Jonah Blue, MD  sodium bicarbonate 650 MG tablet Take 1 tablet (650 mg total) by mouth 3 (three) times daily. 03/20/15   Osvaldo Shipper, MD   traMADol (ULTRAM) 50 MG tablet Take 50-100 mg by mouth every 6 (six) hours as needed for moderate pain.  10/31/12   Historical Provider, MD  warfarin (COUMADIN) 3 MG tablet Take 1 tablet (3 mg total) by mouth daily at 6 PM. Take 1 1/2 tablets tonight and then resume 1 tablet (3 mg) once daily; follow-up with your doctor for recheck of your INR next week. Patient taking differently: Take 2-3 mg by mouth daily at 6 PM. Takes 2 mg on mon and thurs Takes 3 mg all other days 07/10/15   Jonah Blue, MD    Family History Family History  Problem Relation Age of Onset  . Stroke Neg Hx   . CAD Neg Hx     Social History Social History  Substance Use Topics  . Smoking status: Former Smoker    Packs/day: 2.00    Years: 50.00    Types: Cigarettes    Quit date: 03/12/2015  .  Smokeless tobacco: Never Used  . Alcohol use No     Allergies   Review of patient's allergies indicates no known allergies.   Review of Systems Review of Systems  Constitutional: Positive for chills and fatigue. Negative for diaphoresis and fever.  HENT: Negative for congestion and rhinorrhea.   Eyes: Negative for visual disturbance.  Respiratory: Positive for cough and shortness of breath. Negative for sputum production, chest tightness, wheezing and stridor.   Cardiovascular: Positive for leg swelling. Negative for chest pain, palpitations and syncope.  Gastrointestinal: Negative for abdominal pain, constipation, diarrhea, nausea and vomiting.  Genitourinary: Negative for dysuria.  Musculoskeletal: Negative for back pain.  Neurological: Negative for light-headedness and headaches.  Psychiatric/Behavioral: Negative for agitation and confusion.  All other systems reviewed and are negative.    Physical Exam Updated Vital Signs BP 156/86   Pulse 74   Temp 98.2 F (36.8 C) (Oral)   Resp 18   Ht 5\' 4"  (1.626 m)   Wt 121 lb (54.9 kg)   SpO2 100%   BMI 20.77 kg/m   Physical Exam  Constitutional: She  appears well-developed and well-nourished. No distress.  HENT:  Mouth/Throat: Oropharynx is clear and moist. No oropharyngeal exudate.  Eyes: Conjunctivae and EOM are normal. Pupils are equal, round, and reactive to light.  Neck: Normal range of motion.  Cardiovascular: Normal rate.   No murmur heard. Pulmonary/Chest: Effort normal. No stridor. No respiratory distress. She has no wheezes. She has rales. She exhibits no tenderness.  Abdominal: Soft. She exhibits no distension. There is no tenderness. There is no guarding.  Musculoskeletal: She exhibits edema. She exhibits no tenderness.       Right lower leg: She exhibits edema.       Left lower leg: She exhibits edema.  Neurological: She is alert. She displays normal reflexes. She exhibits normal muscle tone. Coordination normal.  Skin: Skin is warm. She is not diaphoretic. No erythema.  Psychiatric: She has a normal mood and affect.  Nursing note and vitals reviewed.    ED Treatments / Results  Labs (all labs ordered are listed, but only abnormal results are displayed) Labs Reviewed  BASIC METABOLIC PANEL - Abnormal; Notable for the following:       Result Value   Chloride 115 (*)    CO2 17 (*)    Glucose, Bld 103 (*)    BUN 45 (*)    Creatinine, Ser 2.96 (*)    Calcium 10.4 (*)    GFR calc non Af Amer 14 (*)    GFR calc Af Amer 16 (*)    All other components within normal limits  CBC - Abnormal; Notable for the following:    WBC 3.9 (*)    RBC 2.86 (*)    Hemoglobin 9.6 (*)    HCT 30.8 (*)    MCV 107.7 (*)    RDW 16.3 (*)    All other components within normal limits  BRAIN NATRIURETIC PEPTIDE - Abnormal; Notable for the following:    B Natriuretic Peptide 625.4 (*)    All other components within normal limits  I-STAT TROPOININ, ED  I-STAT CG4 LACTIC ACID, ED  I-STAT CG4 LACTIC ACID, ED    EKG  EKG Interpretation  Date/Time:  Sunday September 28 2015 11:37:53 EDT Ventricular Rate:  71 PR Interval:    QRS  Duration: 116 QT Interval:  412 QTC Calculation: 447 R Axis:   -73 Text Interpretation:  Atrial fibrillation Left axis deviation Inferior infarct ,  age undetermined Anterior infarct , age undetermined ST & T wave abnormality, consider lateral ischemia Abnormal ECG no stemi.  Confirmed by Rush LandmarkEGELER MD, CHRISTOPHER [phone] on 09/28/2015 5:15:06 PM       Radiology Dg Chest 2 View  Result Date: 09/28/2015 CLINICAL DATA:  Short breath, swelling in the length this morning EXAM: CHEST  2 VIEW COMPARISON:  07/31/2015 in FINDINGS: Stable enlarged cardiac silhouette. There is chronic bronchitic findings hyperinflated lungs. Small effusions noted posteriorly. No pulmonary edema. A small focus of airspace density at the RIGHT cardiophrenic angle which is new from prior. IMPRESSION: Small focus of airspace disease the RIGHT lower lobe may represent pneumonia. Chronic hyperinflated lungs hand small effusions Electronically Signed   By: Genevive BiStewart  Edmunds M.D.   On: 09/28/2015 12:50    Procedures Procedures (including critical care time)  Medications Ordered in ED Medications  vancomycin (VANCOCIN) IVPB 750 mg/150 ml premix (not administered)  acetaminophen (TYLENOL) tablet 650 mg (650 mg Oral Given 09/28/15 1614)  ceFEPIme (MAXIPIME) 1 g in dextrose 5 % 50 mL IVPB (1 g Intravenous New Bag/Given 09/28/15 1633)     Initial Impression / Assessment and Plan / ED Course  I have reviewed the triage vital signs and the nursing notes.  Pertinent labs & imaging results that were available during my care of the patient were reviewed by me and considered in my medical decision making (see chart for details).  Clinical Course    Reynold BowenLillie B Haupert is a pleasant 82 y.o. female with past medical history significant for CAD, CHF, GERD, DVT, hypertension, and history of pneumonia who presents with chills, fatigue, decreased mobility, shortness of breath, cough, and leg swelling. History and physical exam as above. Given  history of CHF and leg edema, fluid overload considered as etiology of shortness of breath and swelling. Given chills and cough, pneumonia considered. Patient had laboratory imaging testing with results above. Patient did have elevated BNP of 625 however, does not appear to be is elevated as prior. Patient was however found to have concern for pneumonia on chest x-ray. EKG did not show signs of acute ischemia racing appeared similar to ones previously. Patient denied any chest pain during her ED stay.  Patient's chart review reveals patient was admitted 2 months ago. As such, patient will be treated for care associated pneumonia with broad-spectrum antibiotics.  Patient's lactic acid was not elevated even her concern for concomitant fluid overload, fluid boluses were not given. Will defer to admitting team for diuresis management given the recent changes.  Patient was admitted to hospitalist service for further management of infection and fluid overload setting of CHF.    Final Clinical Impressions(s) / ED Diagnoses   Final diagnoses:  HCAP (healthcare-associated pneumonia)    New Prescriptions New Prescriptions   No medications on file    Clinical Impression: 1. HCAP (healthcare-associated pneumonia)     Disposition: Admit  Condition: Stable    Canary Brimhristopher J Tegeler, MD 09/28/15 2032

## 2015-09-28 NOTE — Progress Notes (Signed)
ANTICOAGULATION CONSULT NOTE - Follow Up Consult  Pharmacy Consult for Coumadin Indication: atrial fibrillation  No Known Allergies  Patient Measurements: Height: 5\' 4"  (162.6 cm) Weight: 119 lb 7.8 oz (54.2 kg) IBW/kg (Calculated) : 54.7 Heparin Dosing Weight: n/a  Vital Signs: Temp: 98.7 F (37.1 C) (09/17 1954) Temp Source: Oral (09/17 1954) BP: 174/95 (09/17 1954) Pulse Rate: 83 (09/17 1954)  Labs:  Recent Labs  09/28/15 1145 09/28/15 1831 09/28/15 2000  HGB 9.6*  --   --   HCT 30.8*  --   --   PLT 188  --   --   LABPROT  --   --  26.9*  INR  --   --  2.44  CREATININE 2.96*  --   --   TROPONINI  --  <0.03  --     Estimated Creatinine Clearance: 13 mL/min (by C-G formula based on SCr of 2.96 mg/dL (H)).  Assessment: 82 yo female on chronic Coumadin for afib.  INR therapeutic tonight.  Per patient she has not had her dose yet today.      Goal of Therapy:  INR 2-3 Monitor platelets by anticoagulation protocol: Yes   Plan:  1. Coumadin 3 mg x 1 tonight. 2. Daily PT/INR.  [name] MooreJessica Khristin Alexander, Pharm D, BCPS  Clinical Pharmacist Pager [phone]  09/28/2015 10:24 PM

## 2015-09-28 NOTE — H&P (Addendum)
[facility] History and Physical  [name] Alexander OZD:[id number] DOB: 26-Jul-1933 DOA: 09/28/2015  Referring physician: *  PCP: [name], MD   Chief Complaint:  Weakness, shortness of breath  HPI:    82 y.o. female with chronic combined systolic diastolic heart failure last ECHO 07/18/2015 with EF 45% and diastolic CHF, COPD on home oxygen 2 - 3L via , former smoker, chronic kidney disease, hypertension, A. fib on Coumadin, presented to the emergency department with complaints of weight gain of about 10 pounds in the last 4-6 weeks. Patient has developed worsening swelling in bilateral lower extremities. Patient also has difficulty bearing weight due to pain in both her feet. She reports shortness of breath with ambulation. Last week the patient had vaginal spotting which resolved on its own. This week the patient had some mild nosebleeds which again was self-limited. Patient uses 2-3 L of oxygen at baseline. This morning patient also endorses 3 episodes of diarrhea but no melena or hematochezia. Patient has gradually declined over the course of the last 1 week. She was noted to have chills, nonproductive cough. She has progressively become more lethargic with no energy and decreased by mouth intake.  In the ER patient was found to have a creatinine of 2.96, this is close to her baseline. Troponin 0.04. Lactic acid 1.05. INR has not been checked. As per the patient's daughter-in-law, her Lasix dose has been titrated several times. However patient is not able to tolerate Lasix 80 mg 3 times a day, she feels she has no energy at this dose. Therefore she has been taking Lasix 60 mg 3 times a day. Chest x-ray shows opacity in the right lower lobe. Patient started on treatment for healthcare associated pneumonia. She will be admitted to telemetry  Patient recently hospitalized and discharged on 7/22 for acute respiratory failure thought to be secondary to acute on chronic systolic/diastolic  heart failure  Review of Systems: negative for the following  Constitutional: Positive for chills and fatigue. Negative for diaphoresis and fever.  HENT: Negative for congestion and rhinorrhea.   Eyes: Negative for visual disturbance.  Respiratory: Positive for cough and shortness of breath. Negative for sputum production, chest tightness, wheezing and stridor.   Cardiovascular: Positive for leg swelling. Negative for chest pain, palpitations and syncope. Gastrointestinal: Denies nausea, vomiting, abdominal pain, diarrhea, constipation, blood in stool and abdominal distention.  Genitourinary: Denies dysuria, urgency, frequency, hematuria, flank pain and difficulty urinating.  Musculoskeletal: Denies myalgias, back pain, joint swelling, arthralgias and gait problem.  Skin: Denies pallor, rash and wound.  Neurological: Denies dizziness, seizures, syncope, weakness, light-headedness, numbness and headaches.  Hematological: Denies adenopathy. Easy bruising, personal or family bleeding history  Psychiatric/Behavioral: Denies suicidal ideation, mood changes, confusion, nervousness, sleep disturbance and agitation       Past Medical History:  Diagnosis Date  . Arthritis    "hips, knees, shoulders" (07/31/2015)  . Chronic diastolic CHF (congestive heart failure), NYHA class 2 (HCC)   . CKD (chronic kidney disease), stage IV (HCC)   . COPD (chronic obstructive pulmonary disease) (HCC)   . DVT (deep venous thrombosis) (HCC) "2-3 times"  . GERD (gastroesophageal reflux disease)   . Gout   . Hypertension   . Hyperthyroidism   . On home oxygen therapy    "2L; 24/7" (07/31/2015)  . Pneumonia   . Thyroid disease      Past Surgical History:  Procedure Laterality Date  . CATARACT EXTRACTION W/ INTRAOCULAR LENS  IMPLANT, BILATERAL Bilateral   .  JOINT REPLACEMENT    . TOTAL KNEE ARTHROPLASTY Right 06/2004   [name]/notes 05/26/2010  . VAGINAL HYSTERECTOMY        Social History:  reports that she  quit smoking about 6 months ago. Her smoking use included Cigarettes. She has a 100.00 pack-year smoking history. She has never used smokeless tobacco. She reports that she does not drink alcohol or use drugs.    No Known Allergies  Family History  Problem Relation Age of Onset  . Stroke Neg Hx   . CAD Neg Hx          Prior to Admission medications   Medication Sig Start Date End Date Taking? Authorizing Provider  OXYGEN Inhale 2 L into the lungs continuous.   Yes Historical Provider, MD  traMADol (ULTRAM) 50 MG tablet Take 50-100 mg by mouth every 6 (six) hours as needed for moderate pain.  10/31/12  Yes Historical Provider, MD  acetaminophen (TYLENOL) 500 MG tablet Take 500 mg by mouth every 6 (six) hours as needed for mild pain.    Historical Provider, MD  albuterol (PROVENTIL HFA;VENTOLIN HFA) 108 (90 Base) MCG/ACT inhaler Inhale 2 puffs into the lungs every 6 (six) hours as needed for wheezing or shortness of breath. 05/16/15   Mihai Croitoru, MD  allopurinol (ZYLOPRIM) 100 MG tablet Take 1 tablet (100 mg total) by mouth every morning. 07/10/15   Jonah BlueJennifer Yates, MD  diltiazem (CARDIZEM SR) 120 MG 12 hr capsule Take 1 capsule (120 mg total) by mouth 2 (two) times daily. 06/02/15   Mihai Croitoru, MD  feeding supplement, ENSURE COMPLETE, (ENSURE COMPLETE) LIQD Take 237 mLs by mouth 2 (two) times daily between meals. Patient taking differently: Take 237 mLs by mouth daily.  12/18/13   Nishant Dhungel, MD  fluticasone furoate-vilanterol (BREO ELLIPTA) 100-25 MCG/INH AEPB Inhale 1 puff into the lungs daily as needed (for SOB).     Historical Provider, MD  furosemide (LASIX) 40 MG tablet Take 40 mg by mouth 2 (two) times daily.    Laurena SlimmerPreston S Clark, MD  methimazole (TAPAZOLE) 5 MG tablet Take 5 mg by mouth every morning.     Historical Provider, MD  metoprolol tartrate (LOPRESSOR) 25 MG tablet Take 1 tablet (25 mg total) by mouth 2 (two) times daily. 07/10/15   Jonah BlueJennifer Yates, MD  oxycodone  (OXY-IR) 5 MG capsule Take 5 mg by mouth every 4 (four) hours as needed (shortness of breat). Take 1/2 tablet every 12 hours as needed for shortness of breath 07/10/15   Stephani PoliceMarianne L York, PA-C  pantoprazole (PROTONIX) 40 MG tablet Take 1 tablet (40 mg total) by mouth daily. 07/10/15   Jonah BlueJennifer Yates, MD  senna (SENOKOT) 8.6 MG TABS tablet Take 1 tablet (8.6 mg total) by mouth daily. 07/10/15   Jonah BlueJennifer Yates, MD  sodium bicarbonate 650 MG tablet Take 1 tablet (650 mg total) by mouth 3 (three) times daily. 03/20/15   Osvaldo ShipperGokul Krishnan, MD  warfarin (COUMADIN) 3 MG tablet Take 1 tablet (3 mg total) by mouth daily at 6 PM. Take 1 1/2 tablets tonight and then resume 1 tablet (3 mg) once daily; follow-up with your doctor for recheck of your INR next week. Patient taking differently: Take 2-3 mg by mouth daily at 6 PM. Takes 2 mg on mon and thurs Takes 3 mg all other days 07/10/15   Jonah BlueJennifer Yates, MD     Physical Exam: Vitals:   09/28/15 1346 09/28/15 1400 09/28/15 1500 09/28/15 1600  BP: 156/86 166/97 174/95  152/97  Pulse: 74 79 83 75  Resp: 18 22 13 15   Temp:      TempSrc:      SpO2: 100% 100% 100% 100%  Weight:      Height:          Constitutional: NAD, calm, comfortable Vitals:   09/28/15 1346 09/28/15 1400 09/28/15 1500 09/28/15 1600  BP: 156/86 166/97 174/95 152/97  Pulse: 74 79 83 75  Resp: 18 22 13 15   Temp:      TempSrc:      SpO2: 100% 100% 100% 100%  Weight:      Height:       Eyes: PERRL, lids and conjunctivae normal ENMT: Mucous membranes are moist. Posterior pharynx clear of any exudate or lesions.Normal dentition.  Neck: normal, supple, no masses, no thyromegaly Respiratory: clear to auscultation bilaterally, no wheezing, no crackles. Normal respiratory effort. No accessory muscle use.  Cardiovascular: Regular rate and rhythm, no murmurs / rubs / gallops. No extremity edema. 2+ pedal pulses. No carotid bruits.  Abdomen: no tenderness, no masses palpated. No  hepatosplenomegaly. Bowel sounds positive.  Musculoskeletal: She exhibits edema. She exhibits no tenderness.       Right lower leg: She exhibits edema.       Left lower leg: She exhibits edema Skin: no rashes, lesions, ulcers. No induration Neurologic: CN 2-12 grossly intact. Sensation intact, DTR normal. Strength 5/5 in all 4.  Psychiatric: Normal judgment and insight. Alert and oriented x 3. Normal mood.     Labs on Admission: I have personally reviewed following labs and imaging studies  CBC:  Recent Labs Lab 09/28/15 1145  WBC 3.9*  HGB 9.6*  HCT 30.8*  MCV 107.7*  PLT 188    Basic Metabolic Panel:  Recent Labs Lab 09/28/15 1145  NA 141  K 4.3  CL 115*  CO2 17*  GLUCOSE 103*  BUN 45*  CREATININE 2.96*  CALCIUM 10.4*    GFR: Estimated Creatinine Clearance: 13.1 mL/min (by C-G formula based on SCr of 2.96 mg/dL (H)).  Liver Function Tests: No results for input(s): AST, ALT, ALKPHOS, BILITOT, PROT, ALBUMIN in the last 168 hours. No results for input(s): LIPASE, AMYLASE in the last 168 hours. No results for input(s): AMMONIA in the last 168 hours.  Coagulation Profile: No results for input(s): INR, PROTIME in the last 168 hours. No results for input(s): DDIMER in the last 72 hours.  Cardiac Enzymes: No results for input(s): CKTOTAL, CKMB, CKMBINDEX, TROPONINI in the last 168 hours.  BNP (last 3 results) No results for input(s): PROBNP in the last 8760 hours.  HbA1C: No results for input(s): HGBA1C in the last 72 hours. No results found for: HGBA1C   CBG: No results for input(s): GLUCAP in the last 168 hours.  Lipid Profile: No results for input(s): CHOL, HDL, LDLCALC, TRIG, CHOLHDL, LDLDIRECT in the last 72 hours.  Thyroid Function Tests: No results for input(s): TSH, T4TOTAL, FREET4, T3FREE, THYROIDAB in the last 72 hours.  Anemia Panel: No results for input(s): VITAMINB12, FOLATE, FERRITIN, TIBC, IRON, RETICCTPCT in the last 72 hours.  Urine  analysis:    Component Value Date/Time   COLORURINE YELLOW 07/05/2015 0950   APPEARANCEUR CLEAR 07/05/2015 0950   LABSPEC 1.014 07/05/2015 0950   PHURINE 7.0 07/05/2015 0950   GLUCOSEU NEGATIVE 07/05/2015 0950   HGBUR SMALL (A) 07/05/2015 0950   BILIRUBINUR NEGATIVE 07/05/2015 0950   KETONESUR NEGATIVE 07/05/2015 0950   PROTEINUR 100 (A) 07/05/2015 0950   UROBILINOGEN  0.2 12/12/2013 0414   NITRITE NEGATIVE 07/05/2015 0950   LEUKOCYTESUR NEGATIVE 07/05/2015 0950    Sepsis Labs: @LABRCNTIP (procalcitonin:4,lacticidven:4) )No results found for this or any previous visit (from the past 240 hour(s)).       Radiological Exams on Admission: Dg Chest 2 View  Result Date: 09/28/2015 CLINICAL DATA:  Short breath, swelling in the length this morning EXAM: CHEST  2 VIEW COMPARISON:  07/31/2015 in FINDINGS: Stable enlarged cardiac silhouette. There is chronic bronchitic findings hyperinflated lungs. Small effusions noted posteriorly. No pulmonary edema. A small focus of airspace density at the RIGHT cardiophrenic angle which is new from prior. IMPRESSION: Small focus of airspace disease the RIGHT lower lobe may represent pneumonia. Chronic hyperinflated lungs hand small effusions Electronically Signed   By: Genevive Bi M.D.   On: 09/28/2015 12:50   Dg Chest 2 View  Result Date: 09/28/2015 CLINICAL DATA:  Short breath, swelling in the length this morning EXAM: CHEST  2 VIEW COMPARISON:  07/31/2015 in FINDINGS: Stable enlarged cardiac silhouette. There is chronic bronchitic findings hyperinflated lungs. Small effusions noted posteriorly. No pulmonary edema. A small focus of airspace density at the RIGHT cardiophrenic angle which is new from prior. IMPRESSION: Small focus of airspace disease the RIGHT lower lobe may represent pneumonia. Chronic hyperinflated lungs hand small effusions Electronically Signed   By: Genevive Bi M.D.   On: 09/28/2015 12:50       EKG  Interpretation  Date/Time:                  Sunday September 28 2015 11:37:53 EDT Ventricular Rate:   71 PR Interval:                        QRS Duration:        116 QT Interval:                      41 2 QTC Calculation:    447 R Axis:                         -73 Text Interpretation:  Atrial fibrillation Left axis deviation Inferior infarct , age undetermined Anterior infarct , age undetermined ST & T wave abnormality, consider lateral ischemia Abnormal ECG no stemi.  Assessment/Plan Principal Problem:   HCAP (healthcare-associated pneumonia) Pneumonia order set initiated Continue empiric antibiotics Speech therapy consult to rule out aspiration Narrow  antibiotics within the next 48 hours    Chronic respiratory distress due to   chronic systolic and diastolic heart failure/ HCAP , no signs of overt CHF - pt also with severe, eccentric mitral regurgitation due to Rheumatic heart disease - Please note that patient is on 2-3 L of oxygen at home and her oxygen saturations are stable for now on same regimen  - per last admission's cardiology report, pt has reduced systolic function that is likely worse than the 40-45% reported on echo, as it is overestimated in the setting of MR Continue home dose of Lasix for now - may need cardiology input but please note that per last admission notes review, pt was very clear she does not want any cardiac interventions, only medical management  Baseline creatinine around 3  Strict I's and O's     Atrial fibrillation, CHADS2 score 4 - At home patient is on Cardizem and Coumadin per pharmacy - Continue same regimen  Acute on CKD (chronic kidney disease) stage 4,  GFR 15-29 ml/min (HCC) - with baseline Cr ~2 - 3    Normocytic anemia - and anemia of CKD - no active bleeding    Hyperthyroidism - continue Methimazole - TSH 1.606 which is WNL    COPD (chronic obstructive pulmonary disease) (HCC), no acute exacerbation  -  respiratory status stable this AM, oxygen stable and > 92%   protein calorie malnutrition - Body mass index is 17.72 kg/(m^2).      DVT prophylaxis: coumadin     Code Status Orders        Start     Ordered   09/28/15 1805  Full code  Continuous     09/28/15 1811       Family Communication: discussed with patient's Daughter-in-law By the bedside   Disposition Plan:  Anticipate discharge in 2-3 days pending further workup and clinical progress  Consults called: None  Admission status: Inpatient  Total time spent 55 minutes.Greater than 50% of this time was spent in counseling, explanation of diagnosis, planning of further management, and coordination of care  [facility] MD [facility] Pager [phone]  If 7PM-7AM, please contact night-coverage [email or web address] Password [facility]  09/28/2015, 6:12 PM

## 2015-09-28 NOTE — ED Notes (Signed)
Lab to add on BNP.  °

## 2015-09-28 NOTE — ED Triage Notes (Signed)
Per family, Pt is coming from home with complaints of weight gain to the legs bilaterally for the last week. Pt has hx of COPD, Heart Disease, DVTs and Kidney failure. PCP has been trying to manage weight gain with medication. Recently gained 10+ lbs. Pt reports SOB that started this morning when she got up.

## 2015-09-29 ENCOUNTER — Inpatient Hospital Stay (HOSPITAL_COMMUNITY): Payer: Medicare Other

## 2015-09-29 DIAGNOSIS — I5023 Acute on chronic systolic (congestive) heart failure: Secondary | ICD-10-CM

## 2015-09-29 DIAGNOSIS — J441 Chronic obstructive pulmonary disease with (acute) exacerbation: Secondary | ICD-10-CM

## 2015-09-29 DIAGNOSIS — J189 Pneumonia, unspecified organism: Secondary | ICD-10-CM

## 2015-09-29 DIAGNOSIS — N179 Acute kidney failure, unspecified: Secondary | ICD-10-CM

## 2015-09-29 DIAGNOSIS — J9621 Acute and chronic respiratory failure with hypoxia: Secondary | ICD-10-CM

## 2015-09-29 DIAGNOSIS — N189 Chronic kidney disease, unspecified: Secondary | ICD-10-CM

## 2015-09-29 DIAGNOSIS — I480 Paroxysmal atrial fibrillation: Secondary | ICD-10-CM

## 2015-09-29 DIAGNOSIS — N183 Chronic kidney disease, stage 3 (moderate): Secondary | ICD-10-CM

## 2015-09-29 DIAGNOSIS — J9601 Acute respiratory failure with hypoxia: Secondary | ICD-10-CM

## 2015-09-29 LAB — CBC
HCT: 28.6 % — ABNORMAL LOW (ref 36.0–46.0)
HEMOGLOBIN: 9.1 g/dL — AB (ref 12.0–15.0)
MCH: 34.1 pg — AB (ref 26.0–34.0)
MCHC: 31.8 g/dL (ref 30.0–36.0)
MCV: 107.1 fL — AB (ref 78.0–100.0)
PLATELETS: 190 10*3/uL (ref 150–400)
RBC: 2.67 MIL/uL — AB (ref 3.87–5.11)
RDW: 16.2 % — ABNORMAL HIGH (ref 11.5–15.5)
WBC: 3.9 10*3/uL — AB (ref 4.0–10.5)

## 2015-09-29 LAB — COMPREHENSIVE METABOLIC PANEL
ALT: 15 U/L (ref 14–54)
AST: 19 U/L (ref 15–41)
Albumin: 3 g/dL — ABNORMAL LOW (ref 3.5–5.0)
Alkaline Phosphatase: 82 U/L (ref 38–126)
Anion gap: 8 (ref 5–15)
BUN: 39 mg/dL — ABNORMAL HIGH (ref 6–20)
CALCIUM: 10.6 mg/dL — AB (ref 8.9–10.3)
CHLORIDE: 113 mmol/L — AB (ref 101–111)
CO2: 20 mmol/L — AB (ref 22–32)
CREATININE: 2.66 mg/dL — AB (ref 0.44–1.00)
GFR, EST AFRICAN AMERICAN: 18 mL/min — AB (ref >60)
GFR, EST NON AFRICAN AMERICAN: 16 mL/min — AB (ref >60)
Glucose, Bld: 89 mg/dL (ref 65–99)
Potassium: 4.4 mmol/L (ref 3.5–5.1)
SODIUM: 141 mmol/L (ref 135–145)
Total Bilirubin: 0.4 mg/dL (ref 0.3–1.2)
Total Protein: 5.8 g/dL — ABNORMAL LOW (ref 6.5–8.1)

## 2015-09-29 LAB — URIC ACID: Uric Acid, Serum: 5.6 mg/dL (ref 2.3–6.6)

## 2015-09-29 LAB — IRON AND TIBC
IRON: 126 ug/dL (ref 28–170)
Saturation Ratios: 36 % — ABNORMAL HIGH (ref 10.4–31.8)
TIBC: 346 ug/dL (ref 250–450)
UIBC: 220 ug/dL

## 2015-09-29 LAB — FOLATE: FOLATE: 23.4 ng/mL (ref >5.9)

## 2015-09-29 LAB — RETICULOCYTES
RBC.: 2.78 MIL/uL — ABNORMAL LOW (ref 3.87–5.11)
RETIC COUNT ABSOLUTE: 47.3 10*3/uL (ref 19.0–186.0)
Retic Ct Pct: 1.7 % (ref 0.4–3.1)

## 2015-09-29 LAB — GLUCOSE, CAPILLARY
GLUCOSE-CAPILLARY: 108 mg/dL — AB (ref 65–99)
GLUCOSE-CAPILLARY: 111 mg/dL — AB (ref 65–99)
Glucose-Capillary: 88 mg/dL (ref 65–99)
Glucose-Capillary: 116 mg/dL — ABNORMAL HIGH (ref 65–99)

## 2015-09-29 LAB — FERRITIN: Ferritin: 36 ng/mL (ref 11–307)

## 2015-09-29 LAB — PROTIME-INR
INR: 2.43
PROTHROMBIN TIME: 26.9 s — AB (ref 11.4–15.2)

## 2015-09-29 LAB — HIV ANTIBODY (ROUTINE TESTING W REFLEX): HIV Screen 4th Generation wRfx: NONREACTIVE

## 2015-09-29 LAB — TROPONIN I
TROPONIN I: 0.03 ng/mL — AB (ref <0.03)
TROPONIN I: 0.03 ng/mL — AB (ref <0.03)

## 2015-09-29 LAB — VITAMIN B12: Vitamin B-12: 411 pg/mL (ref 180–914)

## 2015-09-29 MED ORDER — WARFARIN SODIUM 2 MG PO TABS
2.0000 mg | ORAL_TABLET | Freq: Once | ORAL | Status: AC
  Administered 2015-09-29: 2 mg via ORAL
  Filled 2015-09-29: qty 1

## 2015-09-29 NOTE — Progress Notes (Signed)
ANTICOAGULATION CONSULT NOTE - Follow Up Consult  Pharmacy Consult for Coumadin Indication: atrial fibrillation  No Known Allergies  Patient Measurements: Height: 5\' 4"  (162.6 cm) Weight: 119 lb 7.8 oz (54.2 kg) IBW/kg (Calculated) : 54.7 Heparin Dosing Weight: n/a  Vital Signs: Temp: 98.2 F (36.8 C) (09/18 0618) Temp Source: Oral (09/18 0618) BP: 151/89 (09/18 0618) Pulse Rate: 78 (09/18 0618)  Labs:  Recent Labs  09/28/15 1145 09/28/15 1831 09/28/15 2000 09/29/15 0013 09/29/15 0624  HGB 9.6*  --   --   --  9.1*  HCT 30.8*  --   --   --  28.6*  PLT 188  --   --   --  190  LABPROT  --   --  26.9*  --  26.9*  INR  --   --  2.44  --  2.43  CREATININE 2.96*  --   --   --  2.66*  TROPONINI  --  <0.03  --  0.03* 0.03*    Estimated Creatinine Clearance: 14.4 mL/min (by C-G formula based on SCr of 2.66 mg/dL (H)).  Assessment: 82 yo female on chronic Coumadin for afib.  Admitted with therapeutic INR, remains that way this morning at 2.43. Last warfarin dose on file is 3mg  daily except 2mg  on Mondays- patient unable to confirm.  hgb 9.1, plts 190- no bleeding noted.  Goal of Therapy:  INR 2-3 Monitor platelets by anticoagulation protocol: Yes   Plan:  - Coumadin 2mg  PO x 1 tonight. - Daily PT/INR - Follow for s/s bleeding  Mohsin Crum D. Kadi Hession, PharmD, BCPS Clinical Pharmacist Pager: [phone] 09/29/2015 8:47 AM

## 2015-09-29 NOTE — Consult Note (Signed)
   [facility]   09/29/2015  [name] 1934-01-02 [id number]   [facility] for community services. [facility]  Active consent form on file.  Came by to see patient and she was working with physical therapy.  [facility] RNCM [facility] and indicated that they had difficulty maintaining contact.  Will follow up regarding [facility].   Of note, [facility] services does not replace or interfere with any services that are arranged by inpatient case management or social work. For additional questions or referrals please contact:  Charlesetta ShanksVictoria Caroleann Casler, RN BSN CCM [facility]ia[facility]  [phone] business mobile phone Toll free office [phone]

## 2015-09-29 NOTE — Progress Notes (Signed)
[facility] PROGRESS NOTE  [name] Alexander XBJ:[id number] DOB: [birth year] DOA: 09/28/2015   PCP: [name], MD     Assessment/Plan: Principal Problem:   HCAP (healthcare-associated pneumonia) Active Problems:   Hypertension   Gout   Hyperthyroidism   A-fib (HCC)   CKD (chronic kidney disease), stage III   Edema   COPD exacerbation (HCC)   Acute respiratory failure with hypoxia (HCC)   Acute on chronic congestive heart failure (HCC)    82 y.o. femalewith chronic combined systolic diastolic heart failure last ECHO 07/18/2015 with EF 45% and diastolic CHF, COPD on home oxygen 2 - 3L via , former smoker, chronic kidney disease, hypertension, A. fib on Coumadin, presented to the emergency department with complaints of weight gain of about 10 pounds in the last 4-6 weeks. Patient has developed worsening swelling in bilateral lower extremities. Patient also has difficulty bearing weight due to pain in both her feet. She reports shortness of breath with ambulation. Last week the patient had vaginal spotting which resolved on its own. This week the patient had some mild nosebleeds which again was self-limited. Patient uses 2-3 L of oxygen at baseline. This morning patient also endorses 3 episodes of diarrhea but no melena or hematochezia. Patient has gradually declined over the course of the last 1 week. She was noted to have chills, nonproductive cough. She has progressively become more lethargic with no energy and decreased by mouth intake.  In the ER patient was found to have a creatinine of 2.96, this is close to her baseline. Troponin 0.04. Lactic acid 1.05. INR has not been checked. As per the patient's daughter-in-law, her Lasix dose has been titrated several times. However patient is not able to tolerate Lasix 80 mg 3 times a day, she feels she has no energy at this dose. Therefore she has been taking Lasix 60 mg 3 times a day. Chest x-ray shows opacity in the right lower  lobe. Patient started on treatment for healthcare associated pneumonia. She will be admitted to telemetry  Patient recently hospitalized and discharged on 7/22 for acute respiratory failure thought to be secondary to acute on chronic systolic/diastolic heart failure   Assessment and plan HCAP (healthcare-associated pneumonia) Pneumonia order set initiated Continue empiric antibiotics, narrowing the next 24 hours Speech therapy consult to rule out aspiration      Chronic respiratory distress due to   chronic systolic and diastolic heart failure/ HCAP , no signs of overt CHF - pt also with severe, eccentric mitral regurgitation due to Rheumatic heart disease - Please note that patient is on 2-3 L of oxygen at home and her oxygen saturations are stable for now on same regimen  - per last admission's cardiology report, pt has reduced systolic function that is likely worse than the 40-45% reported on echo, as it is overestimated in the setting of MR Continue Lasix 60 mg by mouth 3 times a day, renal function stable - Consult CHF team for further management Baseline creatinine around 3  Strict I's and O's-1655 cc of urine output      Atrial fibrillation, CHADS2 score 4 - At home patient is on Cardizem and Coumadin per pharmacy - Continue same regimen  Acute on CKD (chronic kidney disease) stage 4, GFR 15-29 ml/min (HCC) - with baseline Cr ~2 - 3 . Creatinine 2.66 today   Macrocytic anemia  - and anemia of CKD - no active bleeding  Check anemia panel   Hyperthyroidism -  continue Methimazole - TSH 1.606 which is WNL    COPD (chronic obstructive pulmonary disease) (HCC), no acute exacerbation  - respiratory status stable this AM, oxygen stable and > 92%   protein calorie malnutrition - Body mass index is 17.72 kg/(m^2).        DVT prophylaxsis Coumadin  Code Status:  Full code     Family Communication: Discussed in detail with the patient, all imaging  results, lab results explained to the patient   Disposition Plan:  PT OT evaluation, cardiology consult        Consultants: CHF team   Procedures none  Antibiotics  Anti-infectives    Start     Dose/Rate Route Frequency Ordered Stop   09/29/15 1400  ceFEPIme (MAXIPIME) 1 g in dextrose 5 % 50 mL IVPB     1 g 100 mL/hr over 30 Minutes Intravenous Every 24 hours 09/28/15 1807     09/28/15 1630  vancomycin (VANCOCIN) IVPB 750 mg/150 ml premix     750 mg 150 mL/hr over 60 Minutes Intravenous Every 48 hours 09/28/15 1617     09/28/15 1545  ceFEPIme (MAXIPIME) 1 g in dextrose 5 % 50 mL IVPB     1 g 100 mL/hr over 30 Minutes Intravenous  Once 09/28/15 1539 09/28/15 1844         HPI/Subjective: Sitting in the chair with a friend , less sob, feeling better  Objective: Vitals:   09/28/15 1600 09/28/15 1850 09/28/15 1954 09/29/15 0618  BP: 152/97 153/80 (!) 174/95 (!) 151/89  Pulse: 75 93 83 78  Resp: 15 19 19 18   Temp:   98.7 F (37.1 C) 98.2 F (36.8 C)  TempSrc:   Oral Oral  SpO2: 100% 100% 99% 100%  Weight:   54.2 kg (119 lb 7.8 oz)   Height:   5\' 4"  (1.626 m)     Intake/Output Summary (Last 24 hours) at 09/29/15 0855 Last data filed at 09/29/15 0330  Gross per 24 hour  Intake               70 ml  Output             1725 ml  Net            -1655 ml    Exam:  Examination:  General exam: Appears calm and comfortable  Respiratory system: Clear to auscultation. Respiratory effort normal. Cardiovascular system: S1 & S2 heard, RRR. No JVD, murmurs, rubs, gallops or clicks. No pedal edema. Gastrointestinal system: Abdomen is nondistended, soft and nontender. No organomegaly or masses felt. Normal bowel sounds heard. Central nervous system: Alert and oriented. No focal neurological deficits. Extremities: Symmetric 5 x 5 power. Skin: No rashes, lesions or ulcers Psychiatry: Judgement and insight appear normal. Mood & affect appropriate.     Data Reviewed: I  have personally reviewed following labs and imaging studies  Micro Results No results found for this or any previous visit (from the past 240 hour(s)).  Radiology Reports Dg Chest 2 View  Result Date: 09/29/2015 CLINICAL DATA:  Shortness of breath, COPD EXAM: CHEST  2 VIEW COMPARISON:  09/28/2015 FINDINGS: Cardiomegaly. Patchy right lower lobe and right upper lobe opacities, slightly increased since prior study concerning for pneumonia. Minimal left basilar opacity, likely atelectasis. Small right pleural effusion. No acute bony abnormality. IMPRESSION: Increasing right lower lobe and right upper lobe patchy opacities concerning for pneumonia. Small right effusion. Left base atelectasis. Stable cardiomegaly Electronically Signed   By: Caryn Bee  [place] M.D.   On: 09/29/2015 07:55   Dg Chest 2 View  Result Date: 09/28/2015 CLINICAL DATA:  Short breath, swelling in the length this morning EXAM: CHEST  2 VIEW COMPARISON:  07/31/2015 in FINDINGS: Stable enlarged cardiac silhouette. There is chronic bronchitic findings hyperinflated lungs. Small effusions noted posteriorly. No pulmonary edema. A small focus of airspace density at the RIGHT cardiophrenic angle which is new from prior. IMPRESSION: Small focus of airspace disease the RIGHT lower lobe may represent pneumonia. Chronic hyperinflated lungs hand small effusions Electronically Signed   By: Genevive Bi M.D.   On: 09/28/2015 12:50     CBC  Recent Labs Lab 09/28/15 1145 09/29/15 0624  WBC 3.9* 3.9*  HGB 9.6* 9.1*  HCT 30.8* 28.6*  PLT 188 190  MCV 107.7* 107.1*  MCH 33.6 34.1*  MCHC 31.2 31.8  RDW 16.3* 16.2*    Chemistries   Recent Labs Lab 09/28/15 1145 09/28/15 1831 09/29/15 0624  NA 141  --  141  K 4.3  --  4.4  CL 115*  --  113*  CO2 17*  --  20*  GLUCOSE 103*  --  89  BUN 45*  --  39*  CREATININE 2.96*  --  2.66*  CALCIUM 10.4*  --  10.6*  MG  --  2.0  --   AST  --   --  19  ALT  --   --  15  ALKPHOS  --   --   82  BILITOT  --   --  0.4   ------------------------------------------------------------------------------------------------------------------ estimated creatinine clearance is 14.4 mL/min (by C-G formula based on SCr of 2.66 mg/dL (H)). ------------------------------------------------------------------------------------------------------------------ No results for input(s): HGBA1C in the last 72 hours. ------------------------------------------------------------------------------------------------------------------ No results for input(s): CHOL, HDL, LDLCALC, TRIG, CHOLHDL, LDLDIRECT in the last 72 hours. ------------------------------------------------------------------------------------------------------------------  Recent Labs  09/28/15 1831  TSH 2.745   ------------------------------------------------------------------------------------------------------------------ No results for input(s): VITAMINB12, FOLATE, FERRITIN, TIBC, IRON, RETICCTPCT in the last 72 hours.  Coagulation profile  Recent Labs Lab 09/28/15 2000 09/29/15 0624  INR 2.44 2.43    No results for input(s): DDIMER in the last 72 hours.  Cardiac Enzymes  Recent Labs Lab 09/28/15 1831 09/29/15 0013 09/29/15 0624  TROPONINI <0.03 0.03* 0.03*   ------------------------------------------------------------------------------------------------------------------ Invalid input(s): POCBNP   CBG:  Recent Labs Lab 09/29/15 0004 09/29/15 0618  GLUCAP 108* 88       Studies: Dg Chest 2 View  Result Date: 09/29/2015 CLINICAL DATA:  Shortness of breath, COPD EXAM: CHEST  2 VIEW COMPARISON:  09/28/2015 FINDINGS: Cardiomegaly. Patchy right lower lobe and right upper lobe opacities, slightly increased since prior study concerning for pneumonia. Minimal left basilar opacity, likely atelectasis. Small right pleural effusion. No acute bony abnormality. IMPRESSION: Increasing right lower lobe and right upper lobe  patchy opacities concerning for pneumonia. Small right effusion. Left base atelectasis. Stable cardiomegaly Electronically Signed   By: Charlett Nose M.D.   On: 09/29/2015 07:55   Dg Chest 2 View  Result Date: 09/28/2015 CLINICAL DATA:  Short breath, swelling in the length this morning EXAM: CHEST  2 VIEW COMPARISON:  07/31/2015 in FINDINGS: Stable enlarged cardiac silhouette. There is chronic bronchitic findings hyperinflated lungs. Small effusions noted posteriorly. No pulmonary edema. A small focus of airspace density at the RIGHT cardiophrenic angle which is new from prior. IMPRESSION: Small focus of airspace disease the RIGHT lower lobe may represent pneumonia. Chronic hyperinflated lungs hand small effusions Electronically Signed   By: Genevive Bi  M.D.   On: 09/28/2015 12:50      No results found for: HGBA1C Lab Results  Component Value Date   CREATININE 2.66 (H) 09/29/2015       Scheduled Meds: . allopurinol  100 mg Oral q morning - 10a  . ceFEPime (MAXIPIME) IV  1 g Intravenous Q24H  . diltiazem  120 mg Oral Q12H  . furosemide  60 mg Oral TID  . Influenza vac split quadrivalent PF  0.5 mL Intramuscular Tomorrow-1000  . methimazole  5 mg Oral q morning - 10a  . metoprolol tartrate  25 mg Oral BID  . pantoprazole  40 mg Oral Daily  . senna  1 tablet Oral Daily  . sodium bicarbonate  650 mg Oral TID  . sodium chloride flush  3 mL Intravenous Q12H  . sodium chloride flush  3 mL Intravenous Q12H  . vancomycin  750 mg Intravenous Q48H  . warfarin  2 mg Oral ONCE-1800  . Warfarin - Pharmacist Dosing Inpatient   Does not apply q1800   Continuous Infusions:    LOS: 1 day    Time spent: >30 MINS    [facility]ty HospitalBROL,[place] Every  [facility] Pager [phone]. If 7PM-7AM, please contact night-coverage at [email or web address], password [email or web address] 09/29/2015, 8:55 AM  LOS: 1 day

## 2015-09-29 NOTE — Consult Note (Signed)
Patient ID: [name] Alexander MRN: [id number], DOB/AGE: Nov 21, 1933   Admit date: 09/28/2015   Reason for Consult: CHF Requesting MD: Dr. Susie Cassette, Internal Medicine    Primary Physician: [name], MD Primary Cardiologist: Dr. Royann Shivers  Pt. Profile:  82 y/o female with h/o combined systolic + diastolic HF, Rheumatic heart disease with moderate to severe MR - not a surgical candidate given fragility and multiple co morbidities, chronic atrial fibrillation on a/c therapy with Coumadin, h/o DVT, stage IV CKD, chronic anemia and COPD on chronic supplemental O2, admitted for dyspnea and increased lethargy, felt to be secondary to PNA and a/c CHF.   Problem List  Past Medical History:  Diagnosis Date  . Arthritis    "hips, knees, shoulders" (07/31/2015)  . Chronic diastolic CHF (congestive heart failure), NYHA class 2 (HCC)   . CKD (chronic kidney disease), stage IV (HCC)   . COPD (chronic obstructive pulmonary disease) (HCC)   . DVT (deep venous thrombosis) (HCC) "2-3 times"  . GERD (gastroesophageal reflux disease)   . Gout   . Hypertension   . Hyperthyroidism   . On home oxygen therapy    "2L; 24/7" (07/31/2015)  . Pneumonia   . Thyroid disease     Past Surgical History:  Procedure Laterality Date  . CATARACT EXTRACTION W/ INTRAOCULAR LENS  IMPLANT, BILATERAL Bilateral   . JOINT REPLACEMENT    . TOTAL KNEE ARTHROPLASTY Right 06/2004   [name] 05/26/2010  . VAGINAL HYSTERECTOMY       Allergies  No Known Allergies  HPI  82 y/o female, followed by Dr. Royann Shivers, with a h/o combined systolic + diastolic CHF, PAF, HTN, COPD on chronic supplemental O2 (3L/min baseline),  stage IV CKD, anemia and h/o DVT. She is on chronic a/c with coumadin for PAF and DVT. Her CHA2DS2 VASc score is 6 (HTN, CHF, PAD, female, age x 2).   Her last hospital admissions were June and July of this year, both for for a/c CHF. Echo revealed slight reduction in EF, down to 45%, previously 50%. There was  change compared to prior study also with diffuse hypokinesis. LVEF was felt to be overestimated by significant MR. Moderate MR was noted, directed posteriorly. This was felt to be likely caused by Rheumatic heart disease. A long discussion was held that admission regarding options. Per notes, "She does not feel that she would be able to tolerate TEE or surgery.  She is quite frail and it seems that surgical intervention is not a good option given her frailty and CKD IV. She is open to talking with palliative care regarding symptom management". Palliative care was consulted. Medical therapy was continued. Plan was in place for patient to be seen at home by [facility] for palliative care services. She was seen for post hospital f/u on 07/18/15 by Theodore Demark, PA-C. She complained of weakness and was mildly volume overloaded. Her home diuretic regimen was increased for several days with plans to be monitored by [facility] RN and f/u with Dr. Royann Shivers. She has not been seen in clinic since that time. Baseline weight ~101-106 lb. Baseline SCr ~3.0.   She presented back to [facility] on 09/28/15 with complaint of weakness and dyspnea, accompanied by a 10 weight gain. Per H&P, patient also noted subjective decline over the past week -more chills, development of nonproductive cough, more lethargic, decreased energy and decreased PO intake.   In ED, BNP was 625 (1,181 previous admit). Scr 2.96. BUN 45. WBC low at  3.9. Hgb 9.1. Afebrile.  CXR shows Increasing right lower lobe and right upper lobe patchy opacities concerning for pneumonia. Small right effusion. Stable cardiomegaly. Troponin abnormal but with flat low level trend at 0.03 x 2. EKG shows chronic atrial fibrillation with a CVR in the 70s. Weight today is 119 lb.   She has had good UOP with PO lasix, 60 mg TID. -2L out yesterday. SCr improved from 2.96>2.66 today. K is stable at 4.4. She is on IV antibiotics for presumed PNA.    Home Medications  Prior to  Admission medications   Medication Sig Start Date End Date Taking? Authorizing Provider  OXYGEN Inhale 2 L into the lungs continuous.   Yes Historical Provider, MD  traMADol (ULTRAM) 50 MG tablet Take 50-100 mg by mouth every 6 (six) hours as needed for moderate pain.  10/31/12  Yes Historical Provider, MD  acetaminophen (TYLENOL) 500 MG tablet Take 500 mg by mouth every 6 (six) hours as needed for mild pain.    Historical Provider, MD  albuterol (PROVENTIL HFA;VENTOLIN HFA) 108 (90 Base) MCG/ACT inhaler Inhale 2 puffs into the lungs every 6 (six) hours as needed for wheezing or shortness of breath. 05/16/15   Mohini Heathcock, MD  allopurinol (ZYLOPRIM) 100 MG tablet Take 1 tablet (100 mg total) by mouth every morning. 07/10/15   Jonah Blue, MD  diltiazem (CARDIZEM SR) 120 MG 12 hr capsule Take 1 capsule (120 mg total) by mouth 2 (two) times daily. 06/02/15   Davin Muramoto, MD  feeding supplement, ENSURE COMPLETE, (ENSURE COMPLETE) LIQD Take 237 mLs by mouth 2 (two) times daily between meals. Patient taking differently: Take 237 mLs by mouth daily.  12/18/13   Nishant Dhungel, MD  fluticasone furoate-vilanterol (BREO ELLIPTA) 100-25 MCG/INH AEPB Inhale 1 puff into the lungs daily as needed (for SOB).     Historical Provider, MD  furosemide (LASIX) 40 MG tablet Take 40 mg by mouth 2 (two) times daily.    [name], MD  methimazole (TAPAZOLE) 5 MG tablet Take 5 mg by mouth every morning.     Historical Provider, MD  metoprolol tartrate (LOPRESSOR) 25 MG tablet Take 1 tablet (25 mg total) by mouth 2 (two) times daily. 07/10/15   Jonah Blue, MD  oxycodone (OXY-IR) 5 MG capsule Take 5 mg by mouth every 4 (four) hours as needed (shortness of breat). Take 1/2 tablet every 12 hours as needed for shortness of breath 07/10/15   Stephani Police, PA-C  pantoprazole (PROTONIX) 40 MG tablet Take 1 tablet (40 mg total) by mouth daily. 07/10/15   Jonah Blue, MD  senna (SENOKOT) 8.6 MG TABS tablet Take 1  tablet (8.6 mg total) by mouth daily. 07/10/15   Jonah Blue, MD  sodium bicarbonate 650 MG tablet Take 1 tablet (650 mg total) by mouth 3 (three) times daily. 03/20/15   Osvaldo Shipper, MD  warfarin (COUMADIN) 3 MG tablet Take 1 tablet (3 mg total) by mouth daily at 6 PM. Take 1 1/2 tablets tonight and then resume 1 tablet (3 mg) once daily; follow-up with your doctor for recheck of your INR next week. Patient taking differently: Take 2-3 mg by mouth daily at 6 PM. Takes 2 mg on mon and thurs Takes 3 mg all other days 07/10/15   Jonah Blue, MD   Hospital Meds  . allopurinol  100 mg Oral q morning - 10a  . ceFEPime (MAXIPIME) IV  1 g Intravenous Q24H  . diltiazem  120 mg  Oral Q12H  . furosemide  60 mg Oral TID  . methimazole  5 mg Oral q morning - 10a  . metoprolol tartrate  25 mg Oral BID  . pantoprazole  40 mg Oral Daily  . senna  1 tablet Oral Daily  . sodium bicarbonate  650 mg Oral TID  . sodium chloride flush  3 mL Intravenous Q12H  . sodium chloride flush  3 mL Intravenous Q12H  . vancomycin  750 mg Intravenous Q48H  . warfarin  2 mg Oral ONCE-1800  . Warfarin - Pharmacist Dosing Inpatient   Does not apply q1800    Family History  Family History  Problem Relation Age of Onset  . Stroke Neg Hx   . CAD Neg Hx     Social History  Social History   Social History  . Marital status: Widowed    Spouse name: N/A  . Number of children: N/A  . Years of education: N/A   Occupational History  . Not on file.   Social History Main Topics  . Smoking status: Former Smoker    Packs/day: 2.00    Years: 50.00    Types: Cigarettes    Quit date: 03/12/2015  . Smokeless tobacco: Never Used  . Alcohol use No  . Drug use: No  . Sexual activity: Yes    Birth control/ protection: Other-see comments   Other Topics Concern  . Not on file   Social History Narrative  . No narrative on file     Review of Systems General:  + chills, no fever, night sweats or weight changes.    Cardiovascular:  No chest pain, dyspnea on exertion, + edema, no orthopnea, palpitations, paroxysmal nocturnal dyspnea. Dermatological: No rash, lesions/masses Respiratory: No cough, dyspnea Urologic: No hematuria, dysuria Abdominal:   No nausea, vomiting, diarrhea, bright red blood per rectum, melena, or hematemesis Neurologic:  No visual changes, wkns, changes in mental status. All other systems reviewed and are otherwise negative except as noted above.  Physical Exam  Blood pressure (!) 151/89, pulse 78, temperature 98.2 F (36.8 C), temperature source Oral, resp. rate 18, height 5\' 4"  (1.626 m), weight 119 lb 7.8 oz (54.2 kg), SpO2 100 %.  General: Pleasant, NAD, elderly and frail Psych: Normal affect. Neuro: Alert and oriented X 3. Moves all extremities spontaneously. HEENT: Normal  Neck: Supple without bruits or JVD. Lungs:  Resp regular and unlabored, CTA. Heart:  irregularly irregular, regular rate 3/6 murmur at apex Abdomen: Soft, non-tender, non-distended, BS + x 4.  Extremities: No clubbing, or cyanosis, 2+ bilateral LEE bilaterally, DP/PT/Radials 2+ and equal bilaterally.  Labs  Troponin [facility](Point of Care Test)  Recent Labs  09/28/15 1155  TROPIPOC 0.04    Recent Labs  09/28/15 1831 09/29/15 0013 09/29/15 0624  TROPONINI <0.03 0.03* 0.03*   Lab Results  Component Value Date   WBC 3.9 (L) 09/29/2015   HGB 9.1 (L) 09/29/2015   HCT 28.6 (L) 09/29/2015   MCV 107.1 (H) 09/29/2015   PLT 190 09/29/2015     Recent Labs Lab 09/29/15 0624  NA 141  K 4.4  CL 113*  CO2 20*  BUN 39*  CREATININE 2.66*  CALCIUM 10.6*  PROT 5.8*  BILITOT 0.4  ALKPHOS 82  ALT 15  AST 19  GLUCOSE 89   No results found for: CHOL, HDL, LDLCALC, TRIG Lab Results  Component Value Date   DDIMER 3.59 (H) 03/18/2015     Radiology/Studies  Dg Chest 2 View  Result Date: 09/29/2015 CLINICAL DATA:  Shortness of breath, COPD EXAM: CHEST  2 VIEW COMPARISON:  09/28/2015 FINDINGS:  Cardiomegaly. Patchy right lower lobe and right upper lobe opacities, slightly increased since prior study concerning for pneumonia. Minimal left basilar opacity, likely atelectasis. Small right pleural effusion. No acute bony abnormality. IMPRESSION: Increasing right lower lobe and right upper lobe patchy opacities concerning for pneumonia. Small right effusion. Left base atelectasis. Stable cardiomegaly Electronically Signed   By: Charlett Nose M.D.   On: 09/29/2015 07:55   Dg Chest 2 View  Result Date: 09/28/2015 CLINICAL DATA:  Short breath, swelling in the length this morning EXAM: CHEST  2 VIEW COMPARISON:  07/31/2015 in FINDINGS: Stable enlarged cardiac silhouette. There is chronic bronchitic findings hyperinflated lungs. Small effusions noted posteriorly. No pulmonary edema. A small focus of airspace density at the RIGHT cardiophrenic angle which is new from prior. IMPRESSION: Small focus of airspace disease the RIGHT lower lobe may represent pneumonia. Chronic hyperinflated lungs hand small effusions Electronically Signed   By: Genevive Bi M.D.   On: 09/28/2015 12:50    ECG  Chronic atrial fibrillation with CVR    ASSESSMENT AND PLAN  1. A/C Combined Systolic + Diastolic CHF:  Her breathing is at baseline, on chronic supplemental O2. She has 2+ bilateral LEE on exam. Her weight is up at 119 lb (baseline after prior admission was 101-106 lb). She has had good UOP with PO lasix, 60 mg TID. She diuresed 2L yesterday. Scr improved from 2.9>>2.6. K is stable. There is plenty of room in BP to continue diuresis. Continue Low sodium diet. Continue daily weights and strict I/Os. Monitor renal function closely. She appears to be on PO Cardizem and short acting metoprolol. Given slightly reduced LVEF at 45% consider switching to long acting metoprolol, Toprol XL. Would avoid coreg given her COPD and presumed PNA. No ACE/ARBs given renal function. Given plans for palliative care, continue to treat  symptoms. Less concern regarding worsening renal function. Can adjust diuretics if needed if she becomes more symptomatic.   2. PNA: on IV antibiotics. IM managing.   3. Moderate to Severe MR: rheumatic heart disease. Not a candidate for surgery. Patient elected medical therapy/ palliative approach during previous hospitalizations.   4. Stage IV CKD: baseline SCr ~3.  SCr was 2.9 at time of admit. 2.66 today. As outlined above,  Given plans for palliative care, continue to treat symptoms. Less concern regarding worsening renal function. Can adjust diuretics if needed if she becomes more symptomatic with her HF.   5. COPD: on chronic home O2. Currently at baseline. Management per IM.   6. Chronic Atrial Fibrillation: on rate control therapy. V-rates controlled in the 60s. Currently on Cardizem and metoprolol. ? Avoidance of Cardizem given LV systolic dysfunction. Will defer to MD. Continue Warfarin for a/c. CHA2DS2 VASc score is 6. INR is therapeutic.   7. H/o DVT: on chronic coumadin with therapeutic INR.   8. Elevated Troponin: flat, low level trend at 0.03>>0.03. In the setting of CKD, a/c CHF and PNA. no CP.  Suspect demand ischemia. No ischemic w/u indicated.   Signed, Robbie Lis, PA-C 09/29/2015, 2:54 PM  I have seen and examined the patient along with Robbie Lis, PA-C.  I have reviewed the chart, notes and new data.  I agree with PA's note.  Key new complaints: "breathing is sooo much better"!; she believes her weight did go up before onset of dyspnea. Son keeps a log of daily weight  for her, so she is not certain. No angina. Still has edema. Key examination changes: 2+ ankle edema, clear lungs, no S3, 2/6 apical holosystolic murmur radiates to axilla Key new findings / data: creatinine improving in face of diuresis  PLAN:  I believe she is almost adequately diuresed. Reevaluate in AM. Discussed purpose of daily weight and recommended that action needs to be taken if  she gains >3 lb above "dry weight" (to be reestablished on this admission). She should at least call for advice if this happens. I would suggest metolazone 2.5 mg once dosed based on weight. She might need this once weekly or even less. Would add KCl if metolazone is administered.  Symptom palliation is the major goal and I would pay less attention to renal function. Would increase diuretics to achieve symptom relief, irregardless of creatinine level. Ideally, we'll be able to provide all or most of her care at home and avoid hospitalization. Agree with switching from metoprolol tartrate to equivalent dose of metoprolol succinate, as long as this is affordable.  Thurmon Fair, MD, [facility] [phone] 09/29/2015, 3:16 PM

## 2015-09-29 NOTE — Evaluation (Signed)
Physical Therapy Evaluation Patient Details Name: [name] BowenLillie B Wandel MRN: [id number] DOB: Jul 24, 1933 Today's Date: 09/29/2015   History of Present Illness  82 y.o. female with chronic combined systolic diastolic heart failure last ECHO 07/18/2015 with EF 45% and diastolic CHF, COPD on home oxygen 2 - 3L via [place], former smoker, chronic kidney disease, hypertension, A. fib on Coumadin, presented to the emergency department with complaints of weight gain of about 10 pounds in the last 4-6 weeks. Patient has developed worsening swelling in bilateral lower extremities. Patient also has difficulty bearing weight due to pain in both her feet. She reports shortness of breath with ambulation. CXR showing opacity, being treated for pneumonia  Clinical Impression   Pt admitted with above diagnosis. Pt currently with functional limitations due to the deficits listed below (see PT Problem List).  Pt will benefit from skilled PT to increase their independence and safety with mobility to allow discharge to the venue listed below.       Follow Up Recommendations Home health PT;Supervision - Intermittent    Equipment Recommendations  3in1 (PT) (Pt may already have)    Recommendations for Other Services       Precautions / Restrictions Precautions Precautions: Fall Precaution Comments: O2 dependent at home      Mobility  Bed Mobility                  Transfers Overall transfer level: Needs assistance Equipment used: Rolling walker (2 wheeled) Transfers: Sit to/from Stand Sit to Stand: Min assist         General transfer comment: Min assist to steady; cues for hand placement  Ambulation/Gait Ambulation/Gait assistance: Min guard Ambulation Distance (Feet): 60 Feet Assistive device: Rolling walker (2 wheeled) Gait Pattern/deviations: Step-through pattern;Decreased step length - right;Decreased step length - left     General Gait Details: Cues to self-monitor for activity  tolerance  Stairs            Wheelchair Mobility    Modified Rankin (Stroke Patients Only)       Balance Overall balance assessment: Needs assistance           Standing balance-Leahy Scale: Poor                               Pertinent Vitals/Pain Pain Assessment: No/denies pain    Home Living Family/patient expects to be discharged to:: Private residence Living Arrangements: Children;Other relatives Available Help at Discharge: Family;Available 24 hours/day (Will need to verify 24 hour assistance) Type of Home: House Home Access: Stairs to enter Entrance Stairs-Rails: Right;Left;Can reach both Entrance Stairs-Number of Steps: 3 Home Layout: One level Home Equipment: Walker - 2 wheels;Shower seat Additional Comments: Aide 9am-11am M-F    Prior Function Level of Independence: Needs assistance   Gait / Transfers Assistance Needed: Ambulates household distances w/ RW   ADL's / Homemaking Assistance Needed: Assist from aide for bathing and dressing.          Hand Dominance        Extremity/Trunk Assessment   Upper Extremity Assessment: Generalized weakness           Lower Extremity Assessment: Generalized weakness         Communication      Cognition Arousal/Alertness: Awake/alert Behavior During Therapy: WFL for tasks assessed/performed Overall Cognitive Status: Within Functional Limits for tasks assessed  General Comments General comments (skin integrity, edema, etc.): Session conducted on 2LO2    Exercises     Assessment/Plan    PT Assessment Patient needs continued PT services  PT Problem List Decreased strength;Decreased activity tolerance;Decreased balance;Decreased mobility;Decreased knowledge of use of DME;Cardiopulmonary status limiting activity          PT Treatment Interventions DME instruction;Gait training;Stair training;Functional mobility training;Therapeutic  activities;Therapeutic exercise;Balance training;Patient/family education    PT Goals (Current goals can be found in the Care Plan section)  Acute Rehab PT Goals Patient Stated Goal: home soon PT Goal Formulation: With patient Time For Goal Achievement: 10/13/15 Potential to Achieve Goals: Good    Frequency Min 3X/week   Barriers to discharge        Co-evaluation               End of Session Equipment Utilized During Treatment: Gait belt;Oxygen Activity Tolerance: Patient tolerated treatment well Patient left: in chair;Other (comment) (with Nursing student, Debbie's assist) Nurse Communication: Mobility status         Time: 4098-1191 PT Time Calculation (min) (ACUTE ONLY): 24 min   Charges:   PT Evaluation $PT Eval Moderate Complexity: 1 Procedure PT Treatments $Gait Training: 8-22 mins   PT G Codes:        Van Clines Hamff 09/29/2015, 4:04 PM  Van Clines,   Acute Rehabilitation Services Pager [phone] Office [phone]

## 2015-09-29 NOTE — Evaluation (Signed)
Clinical/Bedside Swallow Evaluation Patient Details  Name: [name] Alexander MRN: [id number] Date of Birth: 1933-11-06  Today's Date: 09/29/2015 Time: SLP Start Time (ACUTE ONLY): 0950 SLP Stop Time (ACUTE ONLY): 1001 SLP Time Calculation (min) (ACUTE ONLY): 11 min  Past Medical History:  Past Medical History:  Diagnosis Date  . Arthritis    "hips, knees, shoulders" (07/31/2015)  . Chronic diastolic CHF (congestive heart failure), NYHA class 2 (HCC)   . CKD (chronic kidney disease), stage IV (HCC)   . COPD (chronic obstructive pulmonary disease) (HCC)   . DVT (deep venous thrombosis) (HCC) "2-3 times"  . GERD (gastroesophageal reflux disease)   . Gout   . Hypertension   . Hyperthyroidism   . On home oxygen therapy    "2L; 24/7" (07/31/2015)  . Pneumonia   . Thyroid disease    Past Surgical History:  Past Surgical History:  Procedure Laterality Date  . CATARACT EXTRACTION W/ INTRAOCULAR LENS  IMPLANT, BILATERAL Bilateral   . JOINT REPLACEMENT    . TOTAL KNEE ARTHROPLASTY Right 06/2004   [name] 05/26/2010  . VAGINAL HYSTERECTOMY     HPI:  82 y.o. femalewith chronic combined systolic diastolic heart failure last ECHO 07/18/2015 with EF 45% and diastolic CHF, COPD on home oxygen 2 - 3L via [name], former smoker, chronic kidney disease, hypertension, A. fib on Coumadin, presented to the emergency department with complaints of weight gain of about 10 pounds in the last 4-6 weeks. Patient has developed worsening swelling in bilateral lower extremities. Chest x-ray shows opacity in the right lower lobe. Patient started on treatment for healthcare associated pneumonia.    Assessment / Plan / Recommendation Clinical Impression  Pt demonstrates adequate ability to masticate and swallow subjectively at bedsdie with no signs of aspiration or dysphagia observed. Pt does report intermittent GER and occasional globus with steaks and pills. Discussed basic precautions and strategies. No diet modification  needed, will sign off.     Aspiration Risk  Mild aspiration risk    Diet Recommendation Regular;Thin liquid   Liquid Administration via: Cup;Straw Medication Administration: Whole meds with liquid Supervision: Patient able to self feed Compensations: Slow rate;Small sips/bites Postural Changes: Seated upright at 90 degrees    Other  Recommendations Oral Care Recommendations: Oral care BID   Follow up Recommendations        Frequency and Duration            Prognosis        Swallow Study   General HPI: 82 y.o. femalewith chronic combined systolic diastolic heart failure last ECHO 07/18/2015 with EF 45% and diastolic CHF, COPD on home oxygen 2 - 3L via [name], former smoker, chronic kidney disease, hypertension, A. fib on Coumadin, presented to the emergency department with complaints of weight gain of about 10 pounds in the last 4-6 weeks. Patient has developed worsening swelling in bilateral lower extremities. Chest x-ray shows opacity in the right lower lobe. Patient started on treatment for healthcare associated pneumonia.  Type of Study: Bedside Swallow Evaluation Diet Prior to this Study: Regular;Thin liquids Temperature Spikes Noted: No Respiratory Status: Nasal cannula History of Recent Intubation: No Behavior/Cognition: Alert;Cooperative;Pleasant mood Oral Cavity Assessment: Within Functional Limits Oral Care Completed by SLP: No Oral Cavity - Dentition: Poor condition;Missing dentition Vision: Functional for self-feeding Self-Feeding Abilities: Able to feed self Patient Positioning: Upright in chair Baseline Vocal Quality: Normal Volitional Cough: Strong Volitional Swallow: Able to elicit    Oral/Motor/Sensory Function     Ice Chips  Thin Liquid Thin Liquid: Within functional limits Presentation: Cup;Straw;Self Fed    Nectar Thick Nectar Thick Liquid: Not tested   Honey Thick Honey Thick Liquid: Not tested   Puree Puree: Within functional limits    Solid   GO   Solid: Within functional limits        [name], [name] 09/29/2015,10:23 AM

## 2015-09-30 DIAGNOSIS — Z23 Encounter for immunization: Secondary | ICD-10-CM | POA: Diagnosis not present

## 2015-09-30 LAB — COMPREHENSIVE METABOLIC PANEL
ALK PHOS: 75 U/L (ref 38–126)
ALT: 13 U/L — ABNORMAL LOW (ref 14–54)
AST: 16 U/L (ref 15–41)
Albumin: 2.9 g/dL — ABNORMAL LOW (ref 3.5–5.0)
Anion gap: 10 (ref 5–15)
BILIRUBIN TOTAL: 0.3 mg/dL (ref 0.3–1.2)
BUN: 43 mg/dL — AB (ref 6–20)
CALCIUM: 10.3 mg/dL (ref 8.9–10.3)
CO2: 24 mmol/L (ref 22–32)
CREATININE: 2.97 mg/dL — AB (ref 0.44–1.00)
Chloride: 107 mmol/L (ref 101–111)
GFR calc Af Amer: 16 mL/min — ABNORMAL LOW (ref >60)
GFR, EST NON AFRICAN AMERICAN: 14 mL/min — AB (ref >60)
Glucose, Bld: 100 mg/dL — ABNORMAL HIGH (ref 65–99)
POTASSIUM: 4.4 mmol/L (ref 3.5–5.1)
Sodium: 141 mmol/L (ref 135–145)
TOTAL PROTEIN: 5.9 g/dL — AB (ref 6.5–8.1)

## 2015-09-30 LAB — GLUCOSE, CAPILLARY
GLUCOSE-CAPILLARY: 102 mg/dL — AB (ref 65–99)
GLUCOSE-CAPILLARY: 93 mg/dL (ref 65–99)
Glucose-Capillary: 98 mg/dL (ref 65–99)

## 2015-09-30 LAB — PROTIME-INR
INR: 2.46
PROTHROMBIN TIME: 27.1 s — AB (ref 11.4–15.2)

## 2015-09-30 MED ORDER — METOLAZONE 2.5 MG PO TABS: 2.5000 mg | ORAL_TABLET | ORAL | 0 refills | Status: DC

## 2015-09-30 MED ORDER — FUROSEMIDE 20 MG PO TABS: 60.0000 mg | ORAL_TABLET | Freq: Three times a day (TID) | ORAL | 1 refills | Status: DC

## 2015-09-30 MED ORDER — METOLAZONE 2.5 MG PO TABS
2.5000 mg | ORAL_TABLET | ORAL | 0 refills | Status: DC
Start: 2015-10-02 — End: 2015-09-30

## 2015-09-30 MED ORDER — LEVOFLOXACIN 500 MG PO TABS: 500.0000 mg | ORAL_TABLET | ORAL | 0 refills | Status: DC

## 2015-09-30 MED ORDER — SODIUM BICARBONATE 650 MG PO TABS
650.0000 mg | ORAL_TABLET | Freq: Two times a day (BID) | ORAL | Status: DC
  Administered 2015-09-30: 650 mg via ORAL
  Filled 2015-09-30: qty 1

## 2015-09-30 NOTE — Progress Notes (Signed)
[name] BowenLillie B Alexander to be D/C'd Home per MD order.  Discussed prescriptions and follow up appointments with the patient. Prescriptions given to patient, medication list explained in detail. Pt verbalized understanding.    Medication List    TAKE these medications   acetaminophen 650 MG CR tablet Commonly known as:  TYLENOL Take 1,300 mg by mouth 2 (two) times daily.   albuterol 108 (90 Base) MCG/ACT inhaler Commonly known as:  PROVENTIL HFA;VENTOLIN HFA Inhale 2 puffs into the lungs every 6 (six) hours as needed for wheezing or shortness of breath.   allopurinol 100 MG tablet Commonly known as:  ZYLOPRIM Take 1 tablet (100 mg total) by mouth every morning. What changed:  when to take this   BREO ELLIPTA 100-25 MCG/INH Aepb Generic drug:  fluticasone furoate-vilanterol Inhale 1 puff into the lungs daily as needed (shortness of breath).   diltiazem 120 MG 12 hr capsule Commonly known as:  CARDIZEM SR Take 1 capsule (120 mg total) by mouth 2 (two) times daily.   feeding supplement (ENSURE COMPLETE) Liqd Take 237 mLs by mouth 2 (two) times daily between meals. What changed:  when to take this   furosemide 20 MG tablet Commonly known as:  LASIX Take 3 tablets (60 mg total) by mouth 3 (three) times daily. What changed:  medication strength  how much to take  when to take this  additional instructions   levofloxacin 500 MG tablet Commonly known as:  LEVAQUIN Take 1 tablet (500 mg total) by mouth every other day.   methimazole 5 MG tablet Commonly known as:  TAPAZOLE Take 5 mg by mouth daily.   metolazone 2.5 MG tablet Commonly known as:  ZAROXOLYN Take 1 tablet (2.5 mg total) by mouth once a week.   metoprolol tartrate 25 MG tablet Commonly known as:  LOPRESSOR Take 1 tablet (25 mg total) by mouth 2 (two) times daily.   OXYGEN Inhale 2 L into the lungs continuous.   pantoprazole 40 MG tablet Commonly known as:  PROTONIX Take 1 tablet (40 mg total) by mouth  daily.   senna 8.6 MG Tabs tablet Commonly known as:  SENOKOT Take 1 tablet (8.6 mg total) by mouth daily.   sodium bicarbonate 650 MG tablet Take 1 tablet (650 mg total) by mouth 3 (three) times daily. What changed:  when to take this   traMADol 50 MG tablet Commonly known as:  ULTRAM Take 50-100 mg by mouth 2 (two) times daily.   warfarin 1 MG tablet Commonly known as:  COUMADIN Take 2-3 mg by mouth See admin instructions. Take 2 tablets (2 mg) by mouth on Monday and Thursday at 6pm, take 3 tablets (3 mg) on Sunday, Tuesday, Wednesday, Friday and Saturday at 6pm What changed:  Another medication with the same name was removed. Continue taking this medication, and follow the directions you see here.       Vitals:   09/30/15 0548 09/30/15 0700  BP: 127/64 135/65  Pulse: (!) 53 78  Resp: 17 18  Temp: 97.9 F (36.6 C) 97.5 F (36.4 C)    Skin clean, dry and intact without evidence of skin break down, no evidence of skin tears noted. IV catheter discontinued intact. Site without signs and symptoms of complications. Dressing and pressure applied. Pt denies pain at this time. No complaints noted.  An After Visit Summary was printed and given to the patient. Patient escorted via WC, and D/C home via private auto.  [name] Alexander BSN, RN [facility]  [address] Phone [phone]

## 2015-09-30 NOTE — Discharge Summary (Signed)
Physician Discharge Summary  LEVI CRASS MRN: [id number] DOB/AGE: 1933-11-04 82 y.o.  PCP: Foye Spurling, MD   Admit date: 09/28/2015 Discharge date: 09/30/2015  Discharge Diagnoses:    Principal Problem:   HCAP (healthcare-associated pneumonia) Active Problems:   Hypertension   Gout   Hyperthyroidism   A-fib ([place])   CKD (chronic kidney disease), stage III   Edema   COPD exacerbation (HCC)   Acute respiratory failure with hypoxia (HCC)   Acute on chronic congestive heart failure ([place])    Follow-up recommendations Follow-up with PCP in 3-5 days , including all  additional recommended appointments as below Follow-up CBC, CMP in 3-5 days action needs to be taken if she gains >3 lb above "dry weight" , this was discussed with the patient by cardiology.  Follow-up with PCP for INR on 9/21     Current Discharge Medication List    START taking these medications   Details  levofloxacin (LEVAQUIN) 500 MG tablet Take 1 tablet (500 mg total) by mouth every other day. Qty: 3 tablet, Refills: 0    metolazone (ZAROXOLYN) 2.5 MG tablet Take 1 tablet (2.5 mg total) by mouth once a week. Qty: 60 tablet, Refills: 0      CONTINUE these medications which have CHANGED   Details  furosemide (LASIX) 20 MG tablet Take 3 tablets (60 mg total) by mouth 3 (three) times daily. Qty: 90 tablet, Refills: 1      CONTINUE these medications which have NOT CHANGED   Details  acetaminophen (TYLENOL) 650 MG CR tablet Take 1,300 mg by mouth 2 (two) times daily.    albuterol (PROVENTIL HFA;VENTOLIN HFA) 108 (90 Base) MCG/ACT inhaler Inhale 2 puffs into the lungs every 6 (six) hours as needed for wheezing or shortness of breath. Qty: 1 Inhaler, Refills: 3    allopurinol (ZYLOPRIM) 100 MG tablet Take 1 tablet (100 mg total) by mouth every morning. Qty: 10 tablet, Refills: 0    diltiazem (CARDIZEM SR) 120 MG 12 hr capsule Take 1 capsule (120 mg total) by mouth 2 (two) times daily. Qty: 60  capsule, Refills: 6    feeding supplement, ENSURE COMPLETE, (ENSURE COMPLETE) LIQD Take 237 mLs by mouth 2 (two) times daily between meals. Qty: 60 Bottle, Refills: 0    fluticasone furoate-vilanterol (BREO ELLIPTA) 100-25 MCG/INH AEPB Inhale 1 puff into the lungs daily as needed (shortness of breath).     methimazole (TAPAZOLE) 5 MG tablet Take 5 mg by mouth daily.     metoprolol tartrate (LOPRESSOR) 25 MG tablet Take 1 tablet (25 mg total) by mouth 2 (two) times daily. Qty: 20 tablet, Refills: 0    OXYGEN Inhale 2 L into the lungs continuous.    senna (SENOKOT) 8.6 MG TABS tablet Take 1 tablet (8.6 mg total) by mouth daily. Qty: 120 each, Refills: 0    sodium bicarbonate 650 MG tablet Take 1 tablet (650 mg total) by mouth 3 (three) times daily. Qty: 90 tablet, Refills: 0    traMADol (ULTRAM) 50 MG tablet Take 50-100 mg by mouth 2 (two) times daily.     !! warfarin (COUMADIN) 1 MG tablet Take 2-3 mg by mouth See admin instructions. Take 2 tablets (2 mg) by mouth on Monday and Thursday at 6pm, take 3 tablets (3 mg) on Sunday, Tuesday, Wednesday, Friday and Saturday at 6pm    pantoprazole (PROTONIX) 40 MG tablet Take 1 tablet (40 mg total) by mouth daily. Qty: 10 tablet, Refills: 0    !! warfarin (  COUMADIN) 3 MG tablet Take 1 tablet (3 mg total) by mouth daily at 6 PM. Take 1 1/2 tablets tonight and then resume 1 tablet (3 mg) once daily; follow-up with your doctor for recheck of your INR next week. Qty: 10 tablet, Refills: 0     !! - Potential duplicate medications found. Please discuss with provider.       Discharge Condition: *Stable Discharge Instructions Get Medicines reviewed and adjusted: Please take all your medications with you for your next visit with your Primary MD  Please request your Primary MD to go over all hospital tests and procedure/radiological results at the follow up, please ask your Primary MD to get all Hospital records sent to his/her office.  If you  experience worsening of your admission symptoms, develop shortness of breath, life threatening emergency, suicidal or homicidal thoughts you must seek medical attention immediately by calling 911 or calling your MD immediately if symptoms less severe.  You must read complete instructions/literature along with all the possible adverse reactions/side effects for all the Medicines you take and that have been prescribed to you. Take any new Medicines after you have completely understood and accpet all the possible adverse reactions/side effects.   Do not drive when taking Pain medications.   Do not take more than prescribed Pain, Sleep and Anxiety Medications  Special Instructions: If you have smoked or chewed Tobacco in the last 2 yrs please stop smoking, stop any regular Alcohol and or any Recreational drug use.  Wear Seat belts while driving.  Please note  You were cared for by a hospitalist during your hospital stay. Once you are discharged, your primary care physician will handle any further medical issues. Please note that NO REFILLS for any discharge medications will be authorized once you are discharged, as it is imperative that you return to your primary care physician (or establish a relationship with a primary care physician if you do not have one) for your aftercare needs so that they can reassess your need for medications and monitor your lab values.     No Known Allergies    Disposition: Home with home health   Consults:  Cardiology      Significant Diagnostic Studies:  Dg Chest 2 View  Result Date: 09/29/2015 CLINICAL DATA:  Shortness of breath, COPD EXAM: CHEST  2 VIEW COMPARISON:  09/28/2015 FINDINGS: Cardiomegaly. Patchy right lower lobe and right upper lobe opacities, slightly increased since prior study concerning for pneumonia. Minimal left basilar opacity, likely atelectasis. Small right pleural effusion. No acute bony abnormality. IMPRESSION: Increasing right  lower lobe and right upper lobe patchy opacities concerning for pneumonia. Small right effusion. Left base atelectasis. Stable cardiomegaly Electronically Signed   By: Rolm Baptise M.D.   On: 09/29/2015 07:55   Dg Chest 2 View  Result Date: 09/28/2015 CLINICAL DATA:  Short breath, swelling in the length this morning EXAM: CHEST  2 VIEW COMPARISON:  07/31/2015 in FINDINGS: Stable enlarged cardiac silhouette. There is chronic bronchitic findings hyperinflated lungs. Small effusions noted posteriorly. No pulmonary edema. A small focus of airspace density at the RIGHT cardiophrenic angle which is new from prior. IMPRESSION: Small focus of airspace disease the RIGHT lower lobe may represent pneumonia. Chronic hyperinflated lungs hand small effusions Electronically Signed   By: Suzy Bouchard M.D.   On: 09/28/2015 12:50        Filed Weights   09/28/15 1132 09/28/15 1954  Weight: 54.9 kg (121 lb) 54.2 kg (119 lb 7.8 oz)  Microbiology: Recent Results (from the past 240 hour(s))  Culture, blood (routine x 2) Call MD if unable to obtain prior to antibiotics being given     Status: None (Preliminary result)   Collection Time: 09/28/15  7:55 PM  Result Value Ref Range Status   Specimen Description BLOOD LEFT ANTECUBITAL  Final   Special Requests BOTTLES DRAWN AEROBIC AND ANAEROBIC 5CC  Final   Culture NO GROWTH 2 DAYS  Final   Report Status PENDING  Incomplete  Culture, blood (routine x 2) Call MD if unable to obtain prior to antibiotics being given     Status: None (Preliminary result)   Collection Time: 09/28/15  8:00 PM  Result Value Ref Range Status   Specimen Description BLOOD RIGHT ANTECUBITAL  Final   Special Requests BOTTLES DRAWN AEROBIC AND ANAEROBIC 4CC  Final   Culture NO GROWTH 2 DAYS  Final   Report Status PENDING  Incomplete       Blood Culture    Component Value Date/Time   SDES BLOOD RIGHT ANTECUBITAL 09/28/2015 2000   SPECREQUEST BOTTLES DRAWN AEROBIC AND ANAEROBIC  4CC 09/28/2015 2000   CULT NO GROWTH 2 DAYS 09/28/2015 2000   REPTSTATUS PENDING 09/28/2015 2000      Labs: Results for orders placed or performed during the hospital encounter of 09/28/15 (from the past 48 hour(s))  Basic metabolic panel     Status: Abnormal   Collection Time: 09/28/15 11:45 AM  Result Value Ref Range   Sodium 141 135 - 145 mmol/L   Potassium 4.3 3.5 - 5.1 mmol/L   Chloride 115 (H) 101 - 111 mmol/L   CO2 17 (L) 22 - 32 mmol/L   Glucose, Bld 103 (H) 65 - 99 mg/dL   BUN 45 (H) 6 - 20 mg/dL   Creatinine, Ser 2.96 (H) 0.44 - 1.00 mg/dL   Calcium 10.4 (H) 8.9 - 10.3 mg/dL   GFR calc non Af Amer 14 (L) >60 mL/min   GFR calc Af Amer 16 (L) >60 mL/min    Comment: (NOTE) The eGFR has been calculated using the CKD EPI equation. This calculation has not been validated in all clinical situations. eGFR's persistently <60 mL/min signify possible Chronic Kidney Disease.    Anion gap 9 5 - 15  CBC     Status: Abnormal   Collection Time: 09/28/15 11:45 AM  Result Value Ref Range   WBC 3.9 (L) 4.0 - 10.5 K/uL   RBC 2.86 (L) 3.87 - 5.11 MIL/uL   Hemoglobin 9.6 (L) 12.0 - 15.0 g/dL   HCT 30.8 (L) 36.0 - 46.0 %   MCV 107.7 (H) 78.0 - 100.0 fL   MCH 33.6 26.0 - 34.0 pg   MCHC 31.2 30.0 - 36.0 g/dL   RDW 16.3 (H) 11.5 - 15.5 %   Platelets 188 150 - 400 K/uL  Brain natriuretic peptide     Status: Abnormal   Collection Time: 09/28/15 11:45 AM  Result Value Ref Range   B Natriuretic Peptide 625.4 (H) 0.0 - 100.0 pg/mL  I-stat troponin, ED     Status: None   Collection Time: 09/28/15 11:55 AM  Result Value Ref Range   Troponin i, poc 0.04 0.00 - 0.08 ng/mL   Comment 3            Comment: Due to the release kinetics of cTnI, a negative result within the first hours of the onset of symptoms does not rule out myocardial infarction with certainty. If myocardial infarction is  still suspected, repeat the test at appropriate intervals.   I-Stat CG4 Lactic Acid, ED     Status:  None   Collection Time: 09/28/15  3:23 PM  Result Value Ref Range   Lactic Acid, Venous 1.05 0.5 - 1.9 mmol/L  HIV antibody     Status: None   Collection Time: 09/28/15  6:31 PM  Result Value Ref Range   HIV Screen 4th Generation wRfx Non Reactive Non Reactive    Comment: (NOTE) Performed At: [facility] [place], [place] [id number] Lindon Romp MD [id number]   Magnesium     Status: None   Collection Time: 09/28/15  6:31 PM  Result Value Ref Range   Magnesium 2.0 1.7 - 2.4 mg/dL  Phosphorus     Status: None   Collection Time: 09/28/15  6:31 PM  Result Value Ref Range   Phosphorus 3.5 2.5 - 4.6 mg/dL  TSH     Status: None   Collection Time: 09/28/15  6:31 PM  Result Value Ref Range   TSH 2.745 0.350 - 4.500 uIU/mL  Troponin I     Status: None   Collection Time: 09/28/15  6:31 PM  Result Value Ref Range   Troponin I <0.03 <0.03 ng/mL  I-Stat CG4 Lactic Acid, ED     Status: None   Collection Time: 09/28/15  6:50 PM  Result Value Ref Range   Lactic Acid, Venous 1.06 0.5 - 1.9 mmol/L  Culture, blood (routine x 2) Call MD if unable to obtain prior to antibiotics being given     Status: None (Preliminary result)   Collection Time: 09/28/15  7:55 PM  Result Value Ref Range   Specimen Description BLOOD LEFT ANTECUBITAL    Special Requests BOTTLES DRAWN AEROBIC AND ANAEROBIC 5CC    Culture NO GROWTH 2 DAYS    Report Status PENDING   Culture, blood (routine x 2) Call MD if unable to obtain prior to antibiotics being given     Status: None (Preliminary result)   Collection Time: 09/28/15  8:00 PM  Result Value Ref Range   Specimen Description BLOOD RIGHT ANTECUBITAL    Special Requests BOTTLES DRAWN AEROBIC AND ANAEROBIC 4CC    Culture NO GROWTH 2 DAYS    Report Status PENDING   Protime-INR     Status: Abnormal   Collection Time: 09/28/15  8:00 PM  Result Value Ref Range   Prothrombin Time 26.9 (H) 11.4 - 15.2 seconds   INR 2.44   Glucose,  capillary     Status: Abnormal   Collection Time: 09/29/15 12:04 AM  Result Value Ref Range   Glucose-Capillary 108 (H) 65 - 99 mg/dL  Troponin I     Status: Abnormal   Collection Time: 09/29/15 12:13 AM  Result Value Ref Range   Troponin I 0.03 (HH) <0.03 ng/mL    Comment: CRITICAL RESULT CALLED TO, READ BACK BY AND VERIFIED WITH: ISAACS T,RN 09/29/15 0113 WAYK   Uric acid     Status: None   Collection Time: 09/29/15 12:13 AM  Result Value Ref Range   Uric Acid, Serum 5.6 2.3 - 6.6 mg/dL  Glucose, capillary     Status: None   Collection Time: 09/29/15  6:18 AM  Result Value Ref Range   Glucose-Capillary 88 65 - 99 mg/dL  Troponin I     Status: Abnormal   Collection Time: 09/29/15  6:24 AM  Result Value Ref Range   Troponin I 0.03 (HH) <0.03 ng/mL  Comment: CRITICAL VALUE NOTED.  VALUE IS CONSISTENT WITH PREVIOUSLY REPORTED AND CALLED VALUE.  Comprehensive metabolic panel     Status: Abnormal   Collection Time: 09/29/15  6:24 AM  Result Value Ref Range   Sodium 141 135 - 145 mmol/L   Potassium 4.4 3.5 - 5.1 mmol/L   Chloride 113 (H) 101 - 111 mmol/L   CO2 20 (L) 22 - 32 mmol/L   Glucose, Bld 89 65 - 99 mg/dL   BUN 39 (H) 6 - 20 mg/dL   Creatinine, Ser 2.66 (H) 0.44 - 1.00 mg/dL   Calcium 10.6 (H) 8.9 - 10.3 mg/dL   Total Protein 5.8 (L) 6.5 - 8.1 g/dL   Albumin 3.0 (L) 3.5 - 5.0 g/dL   AST 19 15 - 41 U/L   ALT 15 14 - 54 U/L   Alkaline Phosphatase 82 38 - 126 U/L   Total Bilirubin 0.4 0.3 - 1.2 mg/dL   GFR calc non Af Amer 16 (L) >60 mL/min   GFR calc Af Amer 18 (L) >60 mL/min    Comment: (NOTE) The eGFR has been calculated using the CKD EPI equation. This calculation has not been validated in all clinical situations. eGFR's persistently <60 mL/min signify possible Chronic Kidney Disease.    Anion gap 8 5 - 15  CBC     Status: Abnormal   Collection Time: 09/29/15  6:24 AM  Result Value Ref Range   WBC 3.9 (L) 4.0 - 10.5 K/uL   RBC 2.67 (L) 3.87 - 5.11 MIL/uL    Hemoglobin 9.1 (L) 12.0 - 15.0 g/dL   HCT 28.6 (L) 36.0 - 46.0 %   MCV 107.1 (H) 78.0 - 100.0 fL   MCH 34.1 (H) 26.0 - 34.0 pg   MCHC 31.8 30.0 - 36.0 g/dL   RDW 16.2 (H) 11.5 - 15.5 %   Platelets 190 150 - 400 K/uL  Protime-INR     Status: Abnormal   Collection Time: 09/29/15  6:24 AM  Result Value Ref Range   Prothrombin Time 26.9 (H) 11.4 - 15.2 seconds   INR 2.43   Vitamin B12     Status: None   Collection Time: 09/29/15  9:14 AM  Result Value Ref Range   Vitamin B-12 411 180 - 914 pg/mL    Comment: (NOTE) This assay is not validated for testing neonatal or myeloproliferative syndrome specimens for Vitamin B12 levels.   Folate     Status: None   Collection Time: 09/29/15  9:14 AM  Result Value Ref Range   Folate 23.4 >5.9 ng/mL  Iron and TIBC     Status: Abnormal   Collection Time: 09/29/15  9:14 AM  Result Value Ref Range   Iron 126 28 - 170 ug/dL   TIBC 346 250 - 450 ug/dL   Saturation Ratios 36 (H) 10.4 - 31.8 %   UIBC 220 ug/dL  Ferritin     Status: None   Collection Time: 09/29/15  9:14 AM  Result Value Ref Range   Ferritin 36 11 - 307 ng/mL  Reticulocytes     Status: Abnormal   Collection Time: 09/29/15  9:14 AM  Result Value Ref Range   Retic Ct Pct 1.7 0.4 - 3.1 %   RBC. 2.78 (L) 3.87 - 5.11 MIL/uL   Retic Count, Manual 47.3 19.0 - 186.0 K/uL  Glucose, capillary     Status: Abnormal   Collection Time: 09/29/15 12:16 PM  Result Value Ref Range   Glucose-Capillary 111 (  H) 65 - 99 mg/dL  Glucose, capillary     Status: Abnormal   Collection Time: 09/29/15  5:11 PM  Result Value Ref Range   Glucose-Capillary 116 (H) 65 - 99 mg/dL  Glucose, capillary     Status: Abnormal   Collection Time: 09/30/15 12:17 AM  Result Value Ref Range   Glucose-Capillary 102 (H) 65 - 99 mg/dL  Protime-INR     Status: Abnormal   Collection Time: 09/30/15  3:29 AM  Result Value Ref Range   Prothrombin Time 27.1 (H) 11.4 - 15.2 seconds   INR 2.46   Comprehensive metabolic  panel     Status: Abnormal   Collection Time: 09/30/15  3:29 AM  Result Value Ref Range   Sodium 141 135 - 145 mmol/L   Potassium 4.4 3.5 - 5.1 mmol/L   Chloride 107 101 - 111 mmol/L   CO2 24 22 - 32 mmol/L   Glucose, Bld 100 (H) 65 - 99 mg/dL   BUN 43 (H) 6 - 20 mg/dL   Creatinine, Ser 2.97 (H) 0.44 - 1.00 mg/dL   Calcium 10.3 8.9 - 10.3 mg/dL   Total Protein 5.9 (L) 6.5 - 8.1 g/dL   Albumin 2.9 (L) 3.5 - 5.0 g/dL   AST 16 15 - 41 U/L   ALT 13 (L) 14 - 54 U/L   Alkaline Phosphatase 75 38 - 126 U/L   Total Bilirubin 0.3 0.3 - 1.2 mg/dL   GFR calc non Af Amer 14 (L) >60 mL/min   GFR calc Af Amer 16 (L) >60 mL/min    Comment: (NOTE) The eGFR has been calculated using the CKD EPI equation. This calculation has not been validated in all clinical situations. eGFR's persistently <60 mL/min signify possible Chronic Kidney Disease.    Anion gap 10 5 - 15  Glucose, capillary     Status: None   Collection Time: 09/30/15  5:36 AM  Result Value Ref Range   Glucose-Capillary 98 65 - 99 mg/dL     Lipid Panel  No results found for: CHOL, TRIG, HDL, CHOLHDL, VLDL, LDLCALC, LDLDIRECT   No results found for: HGBA1C   Lab Results  Component Value Date   CREATININE 2.97 (H) 09/30/2015     HPI :   82 y/o female, followed by Dr. Sallyanne Kuster, with a h/o combined systolic + diastolic CHF, PAF, HTN, COPD on chronic supplemental O2 (3L/min baseline),  stage IV CKD, anemia and h/o DVT. She is on chronic a/c with coumadin for PAF and DVT. Her CHA2DS2 VASc score is 6 (HTN, CHF, PAD, female, age x 2).  Her last hospital admissions were June and July of this year, both for for a/c CHF. Echo revealed slight reduction in EF, down to 45%, previously 50%.  Moderate MR was noted, directed posteriorly. This was felt to be likely caused by Rheumatic heart disease. A long discussion was held that admission regarding options. Per notes, "She does not feel that she would be able to tolerate TEE or surgery.  She is quite frail and it seems that surgical intervention is not a good option given her frailty and CKD IV. She is open to talking with palliative care regarding symptom management". Palliative care was consulted. Medical therapy was continued. Plan was in place for patient to be seen at home by [facility] for palliative care services.  She was seen for post hospital f/u on 07/18/15 by Rosaria Ferries, PA-C. She complained of weakness and was mildly volume overloaded.  Her home diuretic regimen was increased for several days with plans to be monitored by [facility] RN and f/u with Dr. Sallyanne Kuster. She has not been seen in clinic since that time. Baseline weight ~101-106 lb. Baseline SCr ~3.0.   She presented back to [facility] on 09/28/15 with complaint of weakness and dyspnea, accompanied by a 10 weight gain. Per   patient also noted subjective decline over the past week -more chills, development of nonproductive cough, more lethargic, decreased energy and decreased PO intake.   In ED, BNP was 625 (1,181 previous admit). Scr 2.96. BUN 45. WBC low at 3.9. Hgb 9.1. Afebrile.  CXR shows Increasing right lower lobe and right upper lobe patchy opacities concerning for pneumonia. Small right effusion. Stable cardiomegaly. Troponin abnormal but with flat low level trend at 0.03 x 2. EKG shows chronic atrial fibrillation with a CVR in the 70s. Weight today is 119 lb.   She has had good UOP with PO lasix, 60 mg TID. -2L out yesterday. SCr improved from 2.96>2.66 today. K is stable at 4.4. She is on IV antibiotics for presumed PNA.   HOSPITAL COURSE:   HCAP (healthcare-associated pneumonia) Blood cultures no growth so far, initially started on vancomycin and cefepime, now transition to Levaquin orally Moderate risk of aspiration by speech therapy Now transition to Levaquin every other day  Chronic respiratory distress due to A/C Combined Systolic + Diastolic CHF/ HCAP ,   - pt also with severe, eccentric mitral  regurgitation due to Rheumatic heart disease - Please note that patient is on 2-3 L of oxygen at home and her oxygen saturations are stable for now on same regimen  - per last admission's cardiology report, pt has reduced systolic function that is likely worse than the 40-45% reported on echo, as it is overestimated in the setting of MR Treated with Lasix 60 mg by mouth 3 times a day, renal function stable Her weight is up at 119 lb (baseline after prior admission was 101-106 lb Baseline creatinine around 3  No ACE/ARBs given renal function Symptom palliation is the major goal and I would pay less attention to renal function. Would increase diuretics to achieve symptom relief, irregardless of creatinine level according to Sanda Klein, MD . metolazone 2.5 mg once dosed based on weight. She might need this once weekly or even less    Atrial fibrillation, CHADS2 score 4 - At home patient is on Cardizem and Coumadin per pharmacy - Continue same regimen Check INR on 9/21   Acute on CKD (chronic kidney disease) stage 4, GFR 15-29 ml/min (HCC) - with baseline Cr ~2 - 3 . Creatinine 2.66 today   Macrocytic anemia  - and anemia of CKD - no active bleeding  Check anemia panel   Hyperthyroidism - continue Methimazole - TSH 1.606 which is WNL    COPD (chronic obstructive pulmonary disease) (HCC), no acute exacerbation  - respiratory status stable this AM, oxygen stable and > 92%   protein calorie malnutrition - Body mass index is 17.72 kg/(m^2).      Discharge Exam:   Blood pressure 135/65, pulse 78, temperature 97.5 F (36.4 C), temperature source Oral, resp. rate 18, height _0  (1.626 m), weight 54.2 kg (119 lb 7.8 oz), SpO2 100 %. General: Pleasant, NAD, elderly and frail Psych: Normal affect. Neuro: Alert and oriented X 3. Moves all extremities spontaneously. HEENT: Normal           Neck: Supple without bruits or JVD. Lungs:  Resp  regular and unlabored,  CTA. Heart:  irregularly irregular, regular rate 3/6 murmur at apex Abdomen: Soft, non-tender, non-distended, BS + x 4.  Extremities: No clubbing, or cyanosis, 2+ bilateral LEE bilaterally, DP/PT/Radials 2+ and equal bilaterally     Follow-up Information    Foye Spurling, MD. Schedule an appointment as soon as possible for a visit in 2 day(s).   Specialty:  Internal Medicine Why:  Hospital follow-up Contact information: [address] [place] [address] [phone]        Sanda Klein, MD. Schedule an appointment as soon as possible for a visit in 1 week(s).   Specialty:  Cardiology Why:  Hospital follow-up Contact information: [address] [place] [address] [phone]           Signed: Reyne Dumas 09/30/2015, 10:14 AM        Time spent >45 mins

## 2015-09-30 NOTE — Progress Notes (Signed)
Physical Therapy Treatment Patient Details Name: [name] BowenLillie B Posten MRN: [id number] DOB: 1933-05-20 Today's Date: 09/30/2015    History of Present Illness 82 y.o. female with chronic combined systolic diastolic heart failure last ECHO 07/18/2015 with EF 45% and diastolic CHF, COPD on home oxygen 2 - 3L via [place], former smoker, chronic kidney disease, hypertension, A. fib on Coumadin, presented to the emergency department with complaints of weight gain of about 10 pounds in the last 4-6 weeks. Patient has developed worsening swelling in bilateral lower extremities. Patient also has difficulty bearing weight due to pain in both her feet. She reports shortness of breath with ambulation. CXR showing opacity, being treated for pneumonia    PT Comments    Good effort today with walking; monitored O2 sats and she does continue to qualify for home supplemental O2; See other note for details; noted likely dc today; PT in agreement  Follow Up Recommendations  Home health PT;Supervision - Intermittent     Equipment Recommendations  3in1 (PT) (Pt may already have)    Recommendations for Other Services       Precautions / Restrictions Precautions Precautions: Fall Precaution Comments: O2 dependent at home    Mobility  Bed Mobility                  Transfers Overall transfer level: Needs assistance Equipment used: Rolling walker (2 wheeled)   Sit to Stand: Min assist         General transfer comment: Min assist to steady; cues for hand placement  Ambulation/Gait Ambulation/Gait assistance: Min guard Ambulation Distance (Feet): 80 Feet Assistive device: Rolling walker (2 wheeled)       General Gait Details: Cues to self-monitor for activity tolerance   Stairs            Wheelchair Mobility    Modified Rankin (Stroke Patients Only)       Balance             Standing balance-Leahy Scale: Poor                      Cognition Arousal/Alertness:  Awake/alert Behavior During Therapy: WFL for tasks assessed/performed Overall Cognitive Status: Within Functional Limits for tasks assessed                      Exercises      General Comments General comments (skin integrity, edema, etc.): See other note of this date for O2 qualifying walk      Pertinent Vitals/Pain Pain Assessment: No/denies pain    Home Living                      Prior Function            PT Goals (current goals can now be found in the care plan section) Acute Rehab PT Goals Patient Stated Goal: home soon PT Goal Formulation: With patient Time For Goal Achievement: 10/13/15 Potential to Achieve Goals: Good Progress towards PT goals: Progressing toward goals    Frequency    Min 3X/week      PT Plan Current plan remains appropriate    Co-evaluation             End of Session Equipment Utilized During Treatment: Gait belt;Oxygen Activity Tolerance: Patient tolerated treatment well Patient left: in chair;Other (comment) (with Nursing student, Debbie's assist)     Time: [id number] PT Time Calculation (min) (ACUTE ONLY): 24 min  Charges:  $  Gait Training: 23-37 mins                    G Codes:      Olen Pel 09/30/2015, 4:36 PM  Van Clines, [place]  Acute Rehabilitation Services Pager [phone] Office [phone]

## 2015-09-30 NOTE — Progress Notes (Signed)
Physical Therapy Treatment Note  SATURATION QUALIFICATIONS: (This note is used to comply with regulatory documentation for home oxygen)  Patient Saturations on Room Air at Rest = 92%  Patient Saturations on Room Air while Ambulating = 83%  Patient Saturations on 2 Liters of oxygen while Ambulating = 94%  Please briefly explain why patient needs home oxygen: Patient requires supplemental oxygen to maintain oxygen saturations at acceptable, safe levels with physical activity.  Full PT note to follow;   Van ClinesHolly Ulah Olmo, PT  Acute Rehabilitation Services Pager [phone] Office [phone]

## 2015-09-30 NOTE — Care Management Note (Signed)
Case Management Note  Patient Details  Name: [name] Alexander MRN: [id number] Date of Birth: [birth year]  Subjective/Objective:    CM following for progression and d/c planning.                 Action/Plan: 09/30/2015 Met with pt re [place] needs, pt is active with [facility] for [facility] services and has an aide who provides personal care services. Pt wishes to continue these services with [facility], and PCS ( provider is unclear however pt states that she has been in contact with her [facility] and will inform them of her d/c). [facility] notified of plan to d/c to home later today and need to resume services..  Pt has DME and home Oxygen, this CM called the pt son who plans to provide transportation home and message left to remind him to bring an oxygen tank for use on the ride home. Call placed at 2:10pm.   Expected Discharge Date:     09/30/2015             Expected Discharge Plan:  [place]  In-House Referral:  NA  Discharge planning Services  CM Consult  Post Acute Care Choice:  Home Health Choice offered to:  Patient  DME Arranged:  N/A DME Agency:  NA  HH Arranged:  PT, RN, OT, PCS/Personal Care Services [facility] Agency:  [facility]  Status of Service:  Completed, signed off  If discussed at [place] of Stay Meetings, dates discussed:    Additional Comments:  Adron Bene, RN 09/30/2015, 2:09 PM

## 2015-10-01 DIAGNOSIS — I130 Hypertensive heart and chronic kidney disease with heart failure and stage 1 through stage 4 chronic kidney disease, or unspecified chronic kidney disease: Secondary | ICD-10-CM | POA: Diagnosis not present

## 2015-10-01 DIAGNOSIS — E039 Hypothyroidism, unspecified: Secondary | ICD-10-CM | POA: Diagnosis not present

## 2015-10-01 DIAGNOSIS — J441 Chronic obstructive pulmonary disease with (acute) exacerbation: Secondary | ICD-10-CM | POA: Diagnosis not present

## 2015-10-01 DIAGNOSIS — N184 Chronic kidney disease, stage 4 (severe): Secondary | ICD-10-CM | POA: Diagnosis not present

## 2015-10-01 DIAGNOSIS — Z5181 Encounter for therapeutic drug level monitoring: Secondary | ICD-10-CM | POA: Diagnosis not present

## 2015-10-01 DIAGNOSIS — Z7952 Long term (current) use of systemic steroids: Secondary | ICD-10-CM | POA: Diagnosis not present

## 2015-10-01 DIAGNOSIS — I82403 Acute embolism and thrombosis of unspecified deep veins of lower extremity, bilateral: Secondary | ICD-10-CM | POA: Diagnosis not present

## 2015-10-01 DIAGNOSIS — E43 Unspecified severe protein-calorie malnutrition: Secondary | ICD-10-CM | POA: Diagnosis not present

## 2015-10-01 DIAGNOSIS — I5030 Unspecified diastolic (congestive) heart failure: Secondary | ICD-10-CM | POA: Diagnosis not present

## 2015-10-01 DIAGNOSIS — Z7901 Long term (current) use of anticoagulants: Secondary | ICD-10-CM | POA: Diagnosis not present

## 2015-10-02 ENCOUNTER — Other Ambulatory Visit: Payer: Self-pay

## 2015-10-02 DIAGNOSIS — J449 Chronic obstructive pulmonary disease, unspecified: Secondary | ICD-10-CM | POA: Diagnosis not present

## 2015-10-02 DIAGNOSIS — J441 Chronic obstructive pulmonary disease with (acute) exacerbation: Secondary | ICD-10-CM | POA: Diagnosis not present

## 2015-10-02 DIAGNOSIS — N184 Chronic kidney disease, stage 4 (severe): Secondary | ICD-10-CM | POA: Diagnosis not present

## 2015-10-02 DIAGNOSIS — I5032 Chronic diastolic (congestive) heart failure: Secondary | ICD-10-CM

## 2015-10-02 DIAGNOSIS — I130 Hypertensive heart and chronic kidney disease with heart failure and stage 1 through stage 4 chronic kidney disease, or unspecified chronic kidney disease: Secondary | ICD-10-CM | POA: Diagnosis not present

## 2015-10-02 DIAGNOSIS — E43 Unspecified severe protein-calorie malnutrition: Secondary | ICD-10-CM | POA: Diagnosis not present

## 2015-10-02 DIAGNOSIS — I5030 Unspecified diastolic (congestive) heart failure: Secondary | ICD-10-CM | POA: Diagnosis not present

## 2015-10-02 DIAGNOSIS — Z7901 Long term (current) use of anticoagulants: Secondary | ICD-10-CM | POA: Diagnosis not present

## 2015-10-02 DIAGNOSIS — E039 Hypothyroidism, unspecified: Secondary | ICD-10-CM | POA: Diagnosis not present

## 2015-10-02 DIAGNOSIS — Z7952 Long term (current) use of systemic steroids: Secondary | ICD-10-CM | POA: Diagnosis not present

## 2015-10-02 DIAGNOSIS — I509 Heart failure, unspecified: Secondary | ICD-10-CM | POA: Diagnosis not present

## 2015-10-02 DIAGNOSIS — E049 Nontoxic goiter, unspecified: Secondary | ICD-10-CM | POA: Diagnosis not present

## 2015-10-02 DIAGNOSIS — I825Y9 Chronic embolism and thrombosis of unspecified deep veins of unspecified proximal lower extremity: Secondary | ICD-10-CM | POA: Diagnosis not present

## 2015-10-02 DIAGNOSIS — Z5181 Encounter for therapeutic drug level monitoring: Secondary | ICD-10-CM | POA: Diagnosis not present

## 2015-10-02 DIAGNOSIS — I82403 Acute embolism and thrombosis of unspecified deep veins of lower extremity, bilateral: Secondary | ICD-10-CM | POA: Diagnosis not present

## 2015-10-02 NOTE — Consult Note (Signed)
Follow up call placed to patient and son, Elisabeth MostStevenson for post hospital follow up needs.  Patient recently discharged home with home health care.  Son returned to call, HIPAA verified.  Son states that they currently have [facility] and some paid help and is not sure how long Home Health will continue but would like to restart services for post hospital monitoring for HF exacerbation.  Patientis address is [address], [place] [id number].  Written consent on file.   For questions, please contact:  Charlesetta ShanksVictoria Bradford Cazier, RN BSN CCM [facility] Liaison  [phone] business mobile phone Toll free office [phone]

## 2015-10-03 ENCOUNTER — Other Ambulatory Visit: Payer: Self-pay

## 2015-10-03 DIAGNOSIS — Z7952 Long term (current) use of systemic steroids: Secondary | ICD-10-CM | POA: Diagnosis not present

## 2015-10-03 DIAGNOSIS — Z7901 Long term (current) use of anticoagulants: Secondary | ICD-10-CM | POA: Diagnosis not present

## 2015-10-03 DIAGNOSIS — Z5181 Encounter for therapeutic drug level monitoring: Secondary | ICD-10-CM | POA: Diagnosis not present

## 2015-10-03 DIAGNOSIS — I130 Hypertensive heart and chronic kidney disease with heart failure and stage 1 through stage 4 chronic kidney disease, or unspecified chronic kidney disease: Secondary | ICD-10-CM | POA: Diagnosis not present

## 2015-10-03 DIAGNOSIS — N184 Chronic kidney disease, stage 4 (severe): Secondary | ICD-10-CM | POA: Diagnosis not present

## 2015-10-03 DIAGNOSIS — I5033 Acute on chronic diastolic (congestive) heart failure: Secondary | ICD-10-CM

## 2015-10-03 DIAGNOSIS — I82403 Acute embolism and thrombosis of unspecified deep veins of lower extremity, bilateral: Secondary | ICD-10-CM | POA: Diagnosis not present

## 2015-10-03 DIAGNOSIS — E43 Unspecified severe protein-calorie malnutrition: Secondary | ICD-10-CM | POA: Diagnosis not present

## 2015-10-03 DIAGNOSIS — J441 Chronic obstructive pulmonary disease with (acute) exacerbation: Secondary | ICD-10-CM | POA: Diagnosis not present

## 2015-10-03 DIAGNOSIS — E039 Hypothyroidism, unspecified: Secondary | ICD-10-CM | POA: Diagnosis not present

## 2015-10-03 DIAGNOSIS — I5030 Unspecified diastolic (congestive) heart failure: Secondary | ICD-10-CM | POA: Diagnosis not present

## 2015-10-03 LAB — CULTURE, BLOOD (ROUTINE X 2)
CULTURE: NO GROWTH
Culture: NO GROWTH

## 2015-10-03 NOTE — Patient Outreach (Signed)
[facility]) Care Management  10/03/15  Reynold BowenLillie B Hauschild April 15, 1933 [id number]  Attempted outreach to patient's son, Gwendolyn LimaStevenson Ramo without success for transition of care. Left HIPAA compliant voicemail with RNCM contact information and invited to return call.  TurkeyVictoria R. Lichelle Viets, RN, BSN, CCM [facility] Care Management Coordinator [phone]

## 2015-10-06 ENCOUNTER — Other Ambulatory Visit: Payer: Self-pay

## 2015-10-06 DIAGNOSIS — Z5181 Encounter for therapeutic drug level monitoring: Secondary | ICD-10-CM | POA: Diagnosis not present

## 2015-10-06 DIAGNOSIS — J441 Chronic obstructive pulmonary disease with (acute) exacerbation: Secondary | ICD-10-CM | POA: Diagnosis not present

## 2015-10-06 DIAGNOSIS — I130 Hypertensive heart and chronic kidney disease with heart failure and stage 1 through stage 4 chronic kidney disease, or unspecified chronic kidney disease: Secondary | ICD-10-CM | POA: Diagnosis not present

## 2015-10-06 DIAGNOSIS — I82403 Acute embolism and thrombosis of unspecified deep veins of lower extremity, bilateral: Secondary | ICD-10-CM | POA: Diagnosis not present

## 2015-10-06 DIAGNOSIS — E039 Hypothyroidism, unspecified: Secondary | ICD-10-CM | POA: Diagnosis not present

## 2015-10-06 DIAGNOSIS — Z7901 Long term (current) use of anticoagulants: Secondary | ICD-10-CM | POA: Diagnosis not present

## 2015-10-06 DIAGNOSIS — I5030 Unspecified diastolic (congestive) heart failure: Secondary | ICD-10-CM | POA: Diagnosis not present

## 2015-10-06 DIAGNOSIS — Z7952 Long term (current) use of systemic steroids: Secondary | ICD-10-CM | POA: Diagnosis not present

## 2015-10-06 DIAGNOSIS — E43 Unspecified severe protein-calorie malnutrition: Secondary | ICD-10-CM | POA: Diagnosis not present

## 2015-10-06 DIAGNOSIS — N184 Chronic kidney disease, stage 4 (severe): Secondary | ICD-10-CM | POA: Diagnosis not present

## 2015-10-06 NOTE — Patient Outreach (Signed)
[facility]) Care Management   10/06/15  Reynold BowenLillie B Swindler [birth year] [id number]  Second attempt to outreach patient and/or her son, Gwendolyn LimaStevenson Snuffer for transition of care without success. Left HIPAA compliant voicemail with RNCM contact information and invited return call.  TurkeyVictoria R. Zafiro Routson, RN, BSN, CCM [facility] Care Management Coordinator [phone]

## 2015-10-07 DIAGNOSIS — E039 Hypothyroidism, unspecified: Secondary | ICD-10-CM | POA: Diagnosis not present

## 2015-10-07 DIAGNOSIS — I130 Hypertensive heart and chronic kidney disease with heart failure and stage 1 through stage 4 chronic kidney disease, or unspecified chronic kidney disease: Secondary | ICD-10-CM | POA: Diagnosis not present

## 2015-10-07 DIAGNOSIS — Z5181 Encounter for therapeutic drug level monitoring: Secondary | ICD-10-CM | POA: Diagnosis not present

## 2015-10-07 DIAGNOSIS — E43 Unspecified severe protein-calorie malnutrition: Secondary | ICD-10-CM | POA: Diagnosis not present

## 2015-10-07 DIAGNOSIS — J441 Chronic obstructive pulmonary disease with (acute) exacerbation: Secondary | ICD-10-CM | POA: Diagnosis not present

## 2015-10-07 DIAGNOSIS — I82403 Acute embolism and thrombosis of unspecified deep veins of lower extremity, bilateral: Secondary | ICD-10-CM | POA: Diagnosis not present

## 2015-10-07 DIAGNOSIS — Z7901 Long term (current) use of anticoagulants: Secondary | ICD-10-CM | POA: Diagnosis not present

## 2015-10-07 DIAGNOSIS — N184 Chronic kidney disease, stage 4 (severe): Secondary | ICD-10-CM | POA: Diagnosis not present

## 2015-10-07 DIAGNOSIS — Z7952 Long term (current) use of systemic steroids: Secondary | ICD-10-CM | POA: Diagnosis not present

## 2015-10-07 DIAGNOSIS — I5030 Unspecified diastolic (congestive) heart failure: Secondary | ICD-10-CM | POA: Diagnosis not present

## 2015-10-08 DIAGNOSIS — I130 Hypertensive heart and chronic kidney disease with heart failure and stage 1 through stage 4 chronic kidney disease, or unspecified chronic kidney disease: Secondary | ICD-10-CM | POA: Diagnosis not present

## 2015-10-08 DIAGNOSIS — I5030 Unspecified diastolic (congestive) heart failure: Secondary | ICD-10-CM | POA: Diagnosis not present

## 2015-10-08 DIAGNOSIS — Z5181 Encounter for therapeutic drug level monitoring: Secondary | ICD-10-CM | POA: Diagnosis not present

## 2015-10-08 DIAGNOSIS — I82403 Acute embolism and thrombosis of unspecified deep veins of lower extremity, bilateral: Secondary | ICD-10-CM | POA: Diagnosis not present

## 2015-10-08 DIAGNOSIS — Z7952 Long term (current) use of systemic steroids: Secondary | ICD-10-CM | POA: Diagnosis not present

## 2015-10-08 DIAGNOSIS — E039 Hypothyroidism, unspecified: Secondary | ICD-10-CM | POA: Diagnosis not present

## 2015-10-08 DIAGNOSIS — E43 Unspecified severe protein-calorie malnutrition: Secondary | ICD-10-CM | POA: Diagnosis not present

## 2015-10-08 DIAGNOSIS — Z7901 Long term (current) use of anticoagulants: Secondary | ICD-10-CM | POA: Diagnosis not present

## 2015-10-08 DIAGNOSIS — J441 Chronic obstructive pulmonary disease with (acute) exacerbation: Secondary | ICD-10-CM | POA: Diagnosis not present

## 2015-10-08 DIAGNOSIS — N184 Chronic kidney disease, stage 4 (severe): Secondary | ICD-10-CM | POA: Diagnosis not present

## 2015-10-09 DIAGNOSIS — N184 Chronic kidney disease, stage 4 (severe): Secondary | ICD-10-CM | POA: Diagnosis not present

## 2015-10-09 DIAGNOSIS — I82403 Acute embolism and thrombosis of unspecified deep veins of lower extremity, bilateral: Secondary | ICD-10-CM | POA: Diagnosis not present

## 2015-10-09 DIAGNOSIS — I5030 Unspecified diastolic (congestive) heart failure: Secondary | ICD-10-CM | POA: Diagnosis not present

## 2015-10-09 DIAGNOSIS — J441 Chronic obstructive pulmonary disease with (acute) exacerbation: Secondary | ICD-10-CM | POA: Diagnosis not present

## 2015-10-09 DIAGNOSIS — Z5181 Encounter for therapeutic drug level monitoring: Secondary | ICD-10-CM | POA: Diagnosis not present

## 2015-10-09 DIAGNOSIS — I130 Hypertensive heart and chronic kidney disease with heart failure and stage 1 through stage 4 chronic kidney disease, or unspecified chronic kidney disease: Secondary | ICD-10-CM | POA: Diagnosis not present

## 2015-10-09 DIAGNOSIS — Z7901 Long term (current) use of anticoagulants: Secondary | ICD-10-CM | POA: Diagnosis not present

## 2015-10-09 DIAGNOSIS — Z7952 Long term (current) use of systemic steroids: Secondary | ICD-10-CM | POA: Diagnosis not present

## 2015-10-09 DIAGNOSIS — E43 Unspecified severe protein-calorie malnutrition: Secondary | ICD-10-CM | POA: Diagnosis not present

## 2015-10-09 DIAGNOSIS — E039 Hypothyroidism, unspecified: Secondary | ICD-10-CM | POA: Diagnosis not present

## 2015-10-10 ENCOUNTER — Encounter: Payer: Self-pay | Admitting: Cardiology

## 2015-10-10 DIAGNOSIS — R269 Unspecified abnormalities of gait and mobility: Secondary | ICD-10-CM | POA: Diagnosis not present

## 2015-10-10 DIAGNOSIS — Z5181 Encounter for therapeutic drug level monitoring: Secondary | ICD-10-CM | POA: Diagnosis not present

## 2015-10-10 DIAGNOSIS — J441 Chronic obstructive pulmonary disease with (acute) exacerbation: Secondary | ICD-10-CM | POA: Diagnosis not present

## 2015-10-10 DIAGNOSIS — Z7901 Long term (current) use of anticoagulants: Secondary | ICD-10-CM | POA: Diagnosis not present

## 2015-10-10 DIAGNOSIS — E039 Hypothyroidism, unspecified: Secondary | ICD-10-CM | POA: Diagnosis not present

## 2015-10-10 DIAGNOSIS — I130 Hypertensive heart and chronic kidney disease with heart failure and stage 1 through stage 4 chronic kidney disease, or unspecified chronic kidney disease: Secondary | ICD-10-CM | POA: Diagnosis not present

## 2015-10-10 DIAGNOSIS — Z7952 Long term (current) use of systemic steroids: Secondary | ICD-10-CM | POA: Diagnosis not present

## 2015-10-10 DIAGNOSIS — I5030 Unspecified diastolic (congestive) heart failure: Secondary | ICD-10-CM | POA: Diagnosis not present

## 2015-10-10 DIAGNOSIS — E43 Unspecified severe protein-calorie malnutrition: Secondary | ICD-10-CM | POA: Diagnosis not present

## 2015-10-10 DIAGNOSIS — I82403 Acute embolism and thrombosis of unspecified deep veins of lower extremity, bilateral: Secondary | ICD-10-CM | POA: Diagnosis not present

## 2015-10-10 DIAGNOSIS — J449 Chronic obstructive pulmonary disease, unspecified: Secondary | ICD-10-CM | POA: Diagnosis not present

## 2015-10-10 DIAGNOSIS — N184 Chronic kidney disease, stage 4 (severe): Secondary | ICD-10-CM | POA: Diagnosis not present

## 2015-10-11 ENCOUNTER — Other Ambulatory Visit: Payer: Self-pay

## 2015-10-11 NOTE — Patient Outreach (Signed)
[facility]) Care Management  10/11/15  [name] 09-07-1933 [id number]  Attempted to reach patient or son, [name] LimaStevenson Alexander without success.  TurkeyVictoria R. [name], RN, BSN, CCM [facility] Care Management Coordinator [phone]

## 2015-10-13 ENCOUNTER — Ambulatory Visit: Payer: Medicare Other | Admitting: Cardiology

## 2015-10-13 DIAGNOSIS — E43 Unspecified severe protein-calorie malnutrition: Secondary | ICD-10-CM | POA: Diagnosis not present

## 2015-10-13 DIAGNOSIS — I5030 Unspecified diastolic (congestive) heart failure: Secondary | ICD-10-CM | POA: Diagnosis not present

## 2015-10-13 DIAGNOSIS — Z7901 Long term (current) use of anticoagulants: Secondary | ICD-10-CM | POA: Diagnosis not present

## 2015-10-13 DIAGNOSIS — J441 Chronic obstructive pulmonary disease with (acute) exacerbation: Secondary | ICD-10-CM | POA: Diagnosis not present

## 2015-10-13 DIAGNOSIS — Z7952 Long term (current) use of systemic steroids: Secondary | ICD-10-CM | POA: Diagnosis not present

## 2015-10-13 DIAGNOSIS — I82403 Acute embolism and thrombosis of unspecified deep veins of lower extremity, bilateral: Secondary | ICD-10-CM | POA: Diagnosis not present

## 2015-10-13 DIAGNOSIS — E039 Hypothyroidism, unspecified: Secondary | ICD-10-CM | POA: Diagnosis not present

## 2015-10-13 DIAGNOSIS — I130 Hypertensive heart and chronic kidney disease with heart failure and stage 1 through stage 4 chronic kidney disease, or unspecified chronic kidney disease: Secondary | ICD-10-CM | POA: Diagnosis not present

## 2015-10-13 DIAGNOSIS — Z5181 Encounter for therapeutic drug level monitoring: Secondary | ICD-10-CM | POA: Diagnosis not present

## 2015-10-13 DIAGNOSIS — N184 Chronic kidney disease, stage 4 (severe): Secondary | ICD-10-CM | POA: Diagnosis not present

## 2015-10-14 DIAGNOSIS — I82403 Acute embolism and thrombosis of unspecified deep veins of lower extremity, bilateral: Secondary | ICD-10-CM | POA: Diagnosis not present

## 2015-10-14 DIAGNOSIS — J441 Chronic obstructive pulmonary disease with (acute) exacerbation: Secondary | ICD-10-CM | POA: Diagnosis not present

## 2015-10-14 DIAGNOSIS — I130 Hypertensive heart and chronic kidney disease with heart failure and stage 1 through stage 4 chronic kidney disease, or unspecified chronic kidney disease: Secondary | ICD-10-CM | POA: Diagnosis not present

## 2015-10-14 DIAGNOSIS — Z5181 Encounter for therapeutic drug level monitoring: Secondary | ICD-10-CM | POA: Diagnosis not present

## 2015-10-14 DIAGNOSIS — Z7952 Long term (current) use of systemic steroids: Secondary | ICD-10-CM | POA: Diagnosis not present

## 2015-10-14 DIAGNOSIS — E43 Unspecified severe protein-calorie malnutrition: Secondary | ICD-10-CM | POA: Diagnosis not present

## 2015-10-14 DIAGNOSIS — E039 Hypothyroidism, unspecified: Secondary | ICD-10-CM | POA: Diagnosis not present

## 2015-10-14 DIAGNOSIS — N184 Chronic kidney disease, stage 4 (severe): Secondary | ICD-10-CM | POA: Diagnosis not present

## 2015-10-14 DIAGNOSIS — I5030 Unspecified diastolic (congestive) heart failure: Secondary | ICD-10-CM | POA: Diagnosis not present

## 2015-10-14 DIAGNOSIS — Z7901 Long term (current) use of anticoagulants: Secondary | ICD-10-CM | POA: Diagnosis not present

## 2015-10-15 DIAGNOSIS — E039 Hypothyroidism, unspecified: Secondary | ICD-10-CM | POA: Diagnosis not present

## 2015-10-15 DIAGNOSIS — Z7901 Long term (current) use of anticoagulants: Secondary | ICD-10-CM | POA: Diagnosis not present

## 2015-10-15 DIAGNOSIS — Z7952 Long term (current) use of systemic steroids: Secondary | ICD-10-CM | POA: Diagnosis not present

## 2015-10-15 DIAGNOSIS — J441 Chronic obstructive pulmonary disease with (acute) exacerbation: Secondary | ICD-10-CM | POA: Diagnosis not present

## 2015-10-15 DIAGNOSIS — I5030 Unspecified diastolic (congestive) heart failure: Secondary | ICD-10-CM | POA: Diagnosis not present

## 2015-10-15 DIAGNOSIS — I130 Hypertensive heart and chronic kidney disease with heart failure and stage 1 through stage 4 chronic kidney disease, or unspecified chronic kidney disease: Secondary | ICD-10-CM | POA: Diagnosis not present

## 2015-10-15 DIAGNOSIS — Z5181 Encounter for therapeutic drug level monitoring: Secondary | ICD-10-CM | POA: Diagnosis not present

## 2015-10-15 DIAGNOSIS — N184 Chronic kidney disease, stage 4 (severe): Secondary | ICD-10-CM | POA: Diagnosis not present

## 2015-10-15 DIAGNOSIS — I82403 Acute embolism and thrombosis of unspecified deep veins of lower extremity, bilateral: Secondary | ICD-10-CM | POA: Diagnosis not present

## 2015-10-15 DIAGNOSIS — E43 Unspecified severe protein-calorie malnutrition: Secondary | ICD-10-CM | POA: Diagnosis not present

## 2015-10-17 ENCOUNTER — Encounter: Payer: Self-pay | Admitting: Cardiovascular Disease

## 2015-10-17 ENCOUNTER — Ambulatory Visit (INDEPENDENT_AMBULATORY_CARE_PROVIDER_SITE_OTHER): Payer: Medicare Other | Admitting: Cardiovascular Disease

## 2015-10-17 VITALS — BP 93/51 | HR 64 | Ht 64.0 in | Wt 106.8 lb

## 2015-10-17 DIAGNOSIS — J410 Simple chronic bronchitis: Secondary | ICD-10-CM

## 2015-10-17 DIAGNOSIS — I4819 Other persistent atrial fibrillation: Secondary | ICD-10-CM

## 2015-10-17 DIAGNOSIS — I10 Essential (primary) hypertension: Secondary | ICD-10-CM

## 2015-10-17 DIAGNOSIS — I5042 Chronic combined systolic (congestive) and diastolic (congestive) heart failure: Secondary | ICD-10-CM | POA: Diagnosis not present

## 2015-10-17 DIAGNOSIS — E43 Unspecified severe protein-calorie malnutrition: Secondary | ICD-10-CM

## 2015-10-17 DIAGNOSIS — I481 Persistent atrial fibrillation: Secondary | ICD-10-CM | POA: Diagnosis not present

## 2015-10-17 DIAGNOSIS — I2510 Atherosclerotic heart disease of native coronary artery without angina pectoris: Secondary | ICD-10-CM

## 2015-10-17 DIAGNOSIS — I2584 Coronary atherosclerosis due to calcified coronary lesion: Secondary | ICD-10-CM

## 2015-10-17 DIAGNOSIS — N184 Chronic kidney disease, stage 4 (severe): Secondary | ICD-10-CM

## 2015-10-17 MED ORDER — METOPROLOL TARTRATE 25 MG PO TABS: 12.5000 mg | ORAL_TABLET | Freq: Two times a day (BID) | ORAL | 11 refills | Status: DC

## 2015-10-17 MED ORDER — METOLAZONE 2.5 MG PO TABS: 2.5000 mg | ORAL_TABLET | ORAL | 11 refills | Status: DC

## 2015-10-17 MED ORDER — FUROSEMIDE 20 MG PO TABS: 80.0000 mg | ORAL_TABLET | Freq: Two times a day (BID) | ORAL | 11 refills | Status: DC

## 2015-10-17 NOTE — Progress Notes (Signed)
Patient ID: [name] Alexander, female   DOB: [birth year], 82 y.o.   MRN: [id number]    Cardiology Office Note    Date:  10/18/2015   ID:  [name] Alexander, DOB 01-08-1934, MRN [id number]  PCP:  [name], MD  Cardiologist:   Thurmon Fair, MD   Chief Complaint  Patient presents with  . Follow-up    pt c/o dizzines and itchyness in her left arm and swelling in legs and low bp for a few days     History of Present Illness:  [name] Alexander is a 82 y.o. female with persistent atrial fibrillation, moderate mitral insufficiency, mildly depressed left ventricular systolic function (45%), combined systolic and diastolic heart failure, suspected CAD (coronary calcifications on CT, inferior wall motion abnormality), severe COPD on chronic home O2 (3 L/min), stage IV chronic kidney disease, history of bilateral lower extremity DVT on chronic warfarin anticoagulation, malnutrition/underweight, returning for hospital follow-up (community-acquired pneumonia, discharged September 19).  She has functional class III dyspnea and continues to wear oxygen, continuously. She has mild leg edema. She does not have chest pain. She does not have an active cough or sputum production. She has intermittent wheezing. As in the past she is unaware of palpitations although she is in atrial fibrillation with controlled rate at the time of this appointment.  Both her home scale and our office scale show weight of almost 107 pounds, just above her estimated "dry weight"of under 106 pounds. She does not feel dizzy although her blood pressure is quite low at 93/51. Treatment of heart failure has been difficult due to simultaneous problems with advanced renal insufficiency. Her creatinine hovers around 3.0.  Past Medical History:  Diagnosis Date  . Arthritis    "hips, knees, shoulders" (07/31/2015)  . Chronic diastolic CHF (congestive heart failure), NYHA class 2 (HCC)   . CKD (chronic kidney disease), stage IV (HCC)   . COPD  (chronic obstructive pulmonary disease) (HCC)   . DVT (deep venous thrombosis) (HCC) "2-3 times"  . GERD (gastroesophageal reflux disease)   . Gout   . Hypertension   . Hyperthyroidism   . On home oxygen therapy    "2L; 24/7" (07/31/2015)  . Pneumonia   . Thyroid disease     Past Surgical History:  Procedure Laterality Date  . CATARACT EXTRACTION W/ INTRAOCULAR LENS  IMPLANT, BILATERAL Bilateral   . JOINT REPLACEMENT    . TOTAL KNEE ARTHROPLASTY Right 06/2004   [name] 05/26/2010  . VAGINAL HYSTERECTOMY      Current Medications: Outpatient Medications Prior to Visit  Medication Sig Dispense Refill  . acetaminophen (TYLENOL) 650 MG CR tablet Take 1,300 mg by mouth 2 (two) times daily.    Marland Kitchen albuterol (PROVENTIL HFA;VENTOLIN HFA) 108 (90 Base) MCG/ACT inhaler Inhale 2 puffs into the lungs every 6 (six) hours as needed for wheezing or shortness of breath. 1 Inhaler 3  . allopurinol (ZYLOPRIM) 100 MG tablet Take 1 tablet (100 mg total) by mouth every morning. (Patient taking differently: Take 100 mg by mouth daily. ) 10 tablet 0  . diltiazem (CARDIZEM SR) 120 MG 12 hr capsule Take 1 capsule (120 mg total) by mouth 2 (two) times daily. 60 capsule 6  . feeding supplement, ENSURE COMPLETE, (ENSURE COMPLETE) LIQD Take 237 mLs by mouth 2 (two) times daily between meals. (Patient taking differently: Take 237 mLs by mouth daily. ) 60 Bottle 0  . fluticasone furoate-vilanterol (BREO ELLIPTA) 100-25 MCG/INH AEPB Inhale 1 puff into the lungs daily  as needed (shortness of breath).     Marland Kitchen levofloxacin (LEVAQUIN) 500 MG tablet Take 1 tablet (500 mg total) by mouth every other day. 3 tablet 0  . methimazole (TAPAZOLE) 5 MG tablet Take 5 mg by mouth daily.     . OXYGEN Inhale 2 L into the lungs continuous.    . pantoprazole (PROTONIX) 40 MG tablet Take 1 tablet (40 mg total) by mouth daily. 10 tablet 0  . senna (SENOKOT) 8.6 MG TABS tablet Take 1 tablet (8.6 mg total) by mouth daily. 120 each 0  . sodium  bicarbonate 650 MG tablet Take 1 tablet (650 mg total) by mouth 3 (three) times daily. (Patient taking differently: Take 650 mg by mouth 2 (two) times daily. ) 90 tablet 0  . traMADol (ULTRAM) 50 MG tablet Take 50-100 mg by mouth 2 (two) times daily.     Marland Kitchen warfarin (COUMADIN) 1 MG tablet Take 2-3 mg by mouth See admin instructions. Take 2 tablets (2 mg) by mouth on Monday and Thursday at 6pm, take 3 tablets (3 mg) on Sunday, Tuesday, Wednesday, Friday and Saturday at 6pm    . furosemide (LASIX) 20 MG tablet Take 3 tablets (60 mg total) by mouth 3 (three) times daily. 90 tablet 1  . metolazone (ZAROXOLYN) 2.5 MG tablet Take 1 tablet (2.5 mg total) by mouth once a week. 60 tablet 0  . metoprolol tartrate (LOPRESSOR) 25 MG tablet Take 1 tablet (25 mg total) by mouth 2 (two) times daily. 20 tablet 0   No facility-administered medications prior to visit.      Allergies:   Review of patient's allergies indicates no known allergies.   Social History   Social History  . Marital status: Widowed    Spouse name: N/A  . Number of children: N/A  . Years of education: N/A   Social History Main Topics  . Smoking status: Former Smoker    Packs/day: 2.00    Years: 50.00    Types: Cigarettes    Quit date: 03/12/2015  . Smokeless tobacco: Never Used  . Alcohol use No  . Drug use: No  . Sexual activity: Yes    Birth control/ protection: Other-see comments   Other Topics Concern  . None   Social History Narrative  . None     Family History:  The patient reports that he does not think there is any family history of coronary disease or stroke or arrhythmia. She has lost 2 children, neither one of them to heart disease..  ROS:   Please see the history of present illness.    ROS All other systems reviewed and are negative.   PHYSICAL EXAM:   VS:  BP (!) 93/51 (BP Location: Left Arm, Patient Position: Sitting, Cuff Size: Normal)   Pulse 64   Ht 5\' 4"  (1.626 m)   Wt 106 lb 12.8 oz (48.4 kg)    SpO2 99%   BMI 18.33 kg/m    GEN: Well nourished, well developed, in no acute distress . Wearing oxygen by nasal cannula HEENT: normal  Neck: JVD almost to the angle of the jaw, no carotid bruits, or masses Cardiac: irregular; apical and left lower sternal border 2/6 holosystolic murmur, rubs, or gallops, 1-2+ pedal and ankle edema, wearing compression stockings  Respiratory:  Severely diminished breath sounds bilaterally, but otherwise clear to auscultation bilaterally, normal work of breathing GI: soft, nontender, nondistended, + BS MS: no deformity or atrophy  Skin: warm and dry, no rash Neuro:  Alert and Oriented x 3, Strength and sensation are intact Psych: euthymic mood, full affect  Wt Readings from Last 3 Encounters:  10/17/15 106 lb 12.8 oz (48.4 kg)  09/28/15 119 lb 7.8 oz (54.2 kg)  08/02/15 106 lb 14.4 oz (48.5 kg)      Studies/Labs Reviewed:   EKG:  EKG is ordered today.  The ekg ordered today demonstrates Atrial fibrillation, QS pattern in leads V1-V3, Left axis deviation and possible old inferior MI, questionable LVH, ST depression and T-wave inversion in leads V4-V6, less prominently in the inferior leads. It is not changed from previous tracings.  Recent Labs: 09/28/2015: B Natriuretic Peptide 625.4; Magnesium 2.0; TSH 2.745 09/29/2015: Hemoglobin 9.1; Platelets 190 09/30/2015: ALT 13; BUN 43; Creatinine, Ser 2.97; Potassium 4.4; Sodium 141   Lipid Panel No results found for: CHOL, TRIG, HDL, CHOLHDL, VLDL, LDLCALC, LDLDIRECT  Additional studies/ records that were reviewed today include:  Records of her hospitalization, previous echocardiography and radiology studies,    ASSESSMENT:    1. Chronic combined systolic and diastolic CHF (congestive heart failure) (HCC)   2. Persistent atrial fibrillation (HCC)   3. Coronary artery calcification   4. Atherosclerosis of native coronary artery of native heart without angina pectoris   5. Simple chronic bronchitis  (HCC)   6. Essential hypertension   7. CKD (chronic kidney disease), stage IV (HCC)   8. Protein-calorie malnutrition, severe      PLAN:  In order of problems listed above:  1. CHF: She has evidence for fluid overload with markedly elevated JVP and some residual leg edema. Will increase her metolazone frequency. She has good rate control and only mildly depressed left ventricular systolic function. I think we will slightly decrease her beta blocker dose. Try to keep her weight well under 106 pounds, probably best around the <104 pounds mark. She is taking furosemide 3 times a day which essentially keeps her in the bathroom all day long. We'll try to switch to a twice-daily dosing once we have achieved try weight. 2. Atrial fibrillation: Even if we performed cardioversion atrial fibrillation is likely to recur at her age and with the severity of her lung and heart disease and moderate left atrial enlargement. Amiodarone would probably be the only reasonable antiarrhythmic choice, with the attendant risk of pulmonary toxicity. Mrs. Garn does not want to have cardioversion anyway. She is appropriately anticoagulated, with anticoagulation followed by her primary care physician.  3. CAD: Coronary artery disease is suspected based on the presence of coronary artery calcifications on CT chest, extensive evidence of peripheral arterial disease by imaging studies, suggestion of old inferior myocardial infarction by EKG changes and presence of wall motion abnormalities. She is not a good candidate for either surgical or percutaneous revascularization due to her lung renal problems. She does not have angina pectoris. Medical management as prescribed. 4. COPD: She has quit smoking. I think this is the most important contributor to her dyspnea. Avoid long-acting bronchodilators such as Symbicort, definitely do not use "prn" as rescue inhalers.  5. HTN: Blood pressure is low on metopolol and diltiazem, which also  serves for atrial fibrillation rate control. Dose decreased due to low BP. 6. CKD stage IV: She is not a great candidate for invasive coronary angiography due to the risk of renal failure. She would not be a revascularization candidate anyway. Avoid iodine contrast based procedures. On the other hand, I would focus on palliation for shortness of breath rather than her renal function and will  not hesitate to recommend higher doses of diuretics if needed for symptom relief. 7. She is underweight and has recently experienced a lot of fluctuation in her weight. Even now some of her weight is excessive water weight.    Medication Adjustments/Labs and Tests Ordered: Current medicines are reviewed at length with the patient today.  Concerns regarding medicines are outlined above.  Medication changes, Labs and Tests ordered today are listed in the Patient Instructions below. Patient Instructions  Dr Royann Shivers has recommended making the following medication changes: 1. DECREASE Metoprolol to 12.5 mg twice daily 2. INCREASE Metolazone to 2.5 mg three times weekly - Mondays, Wednesdays, and Fridays 3. WHEN YOU REACH YOUR TARGET WEIGHT OF 105 POUNDS DECREASE Furosemide to 80 mg twice daily  Dr Royann Shivers recommends that you schedule a follow-up appointment in 3 months.  If you need a refill on your cardiac medications before your next appointment, please call your pharmacy.    Signed, Thurmon Fair, MD  10/18/2015 9:39 AM    [facility] [address] [place], [place], [place]  [address] Phone: [phone]; Fax: [phone]

## 2015-10-17 NOTE — Patient Outreach (Signed)
[facility]) Care Management  10/17/2015  [name] Alexander 1933/03/01 [id number]  Unsuccessful outreach letter sent to patient due to inability to reach patient. Will close case if no response from patient or family.  TurkeyVictoria R. [name], RN, BSN, CCM [facility] Care Management Coordinator [phone]

## 2015-10-17 NOTE — Patient Instructions (Signed)
Dr Royann Shiversroitoru has recommended making the following medication changes: 1. DECREASE Metoprolol to 12.5 mg twice daily 2. INCREASE Metolazone to 2.5 mg three times weekly - Mondays, Wednesdays, and Fridays 3. WHEN YOU REACH YOUR TARGET WEIGHT OF 105 POUNDS DECREASE Furosemide to 80 mg twice daily  Dr Royann Shiversroitoru recommends that you schedule a follow-up appointment in 3 months.  If you need a refill on your cardiac medications before your next appointment, please call your pharmacy.

## 2015-10-18 DIAGNOSIS — I2584 Coronary atherosclerosis due to calcified coronary lesion: Secondary | ICD-10-CM

## 2015-10-18 DIAGNOSIS — I2510 Atherosclerotic heart disease of native coronary artery without angina pectoris: Secondary | ICD-10-CM | POA: Insufficient documentation

## 2015-10-20 DIAGNOSIS — I82403 Acute embolism and thrombosis of unspecified deep veins of lower extremity, bilateral: Secondary | ICD-10-CM | POA: Diagnosis not present

## 2015-10-20 DIAGNOSIS — Z5181 Encounter for therapeutic drug level monitoring: Secondary | ICD-10-CM | POA: Diagnosis not present

## 2015-10-20 DIAGNOSIS — Z7952 Long term (current) use of systemic steroids: Secondary | ICD-10-CM | POA: Diagnosis not present

## 2015-10-20 DIAGNOSIS — E43 Unspecified severe protein-calorie malnutrition: Secondary | ICD-10-CM | POA: Diagnosis not present

## 2015-10-20 DIAGNOSIS — N184 Chronic kidney disease, stage 4 (severe): Secondary | ICD-10-CM | POA: Diagnosis not present

## 2015-10-20 DIAGNOSIS — Z7901 Long term (current) use of anticoagulants: Secondary | ICD-10-CM | POA: Diagnosis not present

## 2015-10-20 DIAGNOSIS — E039 Hypothyroidism, unspecified: Secondary | ICD-10-CM | POA: Diagnosis not present

## 2015-10-20 DIAGNOSIS — I130 Hypertensive heart and chronic kidney disease with heart failure and stage 1 through stage 4 chronic kidney disease, or unspecified chronic kidney disease: Secondary | ICD-10-CM | POA: Diagnosis not present

## 2015-10-20 DIAGNOSIS — I5030 Unspecified diastolic (congestive) heart failure: Secondary | ICD-10-CM | POA: Diagnosis not present

## 2015-10-20 DIAGNOSIS — J441 Chronic obstructive pulmonary disease with (acute) exacerbation: Secondary | ICD-10-CM | POA: Diagnosis not present

## 2015-10-27 ENCOUNTER — Telehealth: Payer: Self-pay | Admitting: Cardiovascular Disease

## 2015-10-27 DIAGNOSIS — J441 Chronic obstructive pulmonary disease with (acute) exacerbation: Secondary | ICD-10-CM | POA: Diagnosis not present

## 2015-10-27 DIAGNOSIS — Z7901 Long term (current) use of anticoagulants: Secondary | ICD-10-CM | POA: Diagnosis not present

## 2015-10-27 DIAGNOSIS — N184 Chronic kidney disease, stage 4 (severe): Secondary | ICD-10-CM | POA: Diagnosis not present

## 2015-10-27 DIAGNOSIS — I82403 Acute embolism and thrombosis of unspecified deep veins of lower extremity, bilateral: Secondary | ICD-10-CM | POA: Diagnosis not present

## 2015-10-27 DIAGNOSIS — Z7952 Long term (current) use of systemic steroids: Secondary | ICD-10-CM | POA: Diagnosis not present

## 2015-10-27 DIAGNOSIS — I5030 Unspecified diastolic (congestive) heart failure: Secondary | ICD-10-CM | POA: Diagnosis not present

## 2015-10-27 DIAGNOSIS — I130 Hypertensive heart and chronic kidney disease with heart failure and stage 1 through stage 4 chronic kidney disease, or unspecified chronic kidney disease: Secondary | ICD-10-CM | POA: Diagnosis not present

## 2015-10-27 DIAGNOSIS — E039 Hypothyroidism, unspecified: Secondary | ICD-10-CM | POA: Diagnosis not present

## 2015-10-27 DIAGNOSIS — E43 Unspecified severe protein-calorie malnutrition: Secondary | ICD-10-CM | POA: Diagnosis not present

## 2015-10-27 DIAGNOSIS — Z5181 Encounter for therapeutic drug level monitoring: Secondary | ICD-10-CM | POA: Diagnosis not present

## 2015-10-27 NOTE — Telephone Encounter (Signed)
Would recommend decrease to twice weekly Zaroxolyn 2.5mg  and continue to monitor weights closely. We may need to cut back on Lasix daily dose as well, but since pt has been volume overload recently and dose was recently decreased as is would recommend decrease in Zaroxolyn at this time.

## 2015-10-27 NOTE — Telephone Encounter (Signed)
New message     Pt has medication changes   Pt c/o BP issue: STAT if pt c/o blurred vision, one-sided weakness or slurred speech  1. What are your last 5 BP readings? 94/56  84/53  2. Are you having any other symptoms (ex. Dizziness, headache, blurred vision, passed out)? lethargic and drowsy    3. What is your BP issue? low

## 2015-10-27 NOTE — Telephone Encounter (Signed)
Pt of Dr. Royann Shiversroitoru Seen 10/6. Adjustments made to medications due to hypotension and LE edema.  [facility], notes she went to see patient today and the patient is still having some issues of hypotension. Notes fluid weights have improved, pt at a dry wt of 104 lbs currently & maintaining.  lasix 80mg  bid metoprolol 12.5mg  bid zaroxolyn 2.5mg  TIW   BP at 94/56 initial visit today by PT, 84/53 when rechecked this afternoon by RN.   Informed caller I would need to submit this information to pharmD for review & possible adjustments - Dr. Royann Shiversroitoru out this week. Since she is at risk for fluid volume excess no suggestions made to increase fluid/replace water w electrolyte-containing beverages. HHRN also notes patient is still on sodium bicarb @ home (notes she started as an in-hospital med)  Aware I will follow up w recommendations including OV if warranted.

## 2015-10-28 NOTE — Telephone Encounter (Signed)
Called and discussed provider recommendations w [name] Alexander FiscalLori, [facility]. She voiced understanding. Pt taking Zaroxolyn M,W,F - so advice I gave was to try to drop this back to Monday and Thursday and have [place] or home PT call us towards end of week or follow up early next week. I have also reached out to patient and left detailed msg w instruction (OK per DPR) on her home answering machine. I have requested patient to call me back to confirm understanding of instruction.

## 2015-10-30 NOTE — Telephone Encounter (Signed)
msg left for patient to call. 

## 2015-11-03 DIAGNOSIS — I5030 Unspecified diastolic (congestive) heart failure: Secondary | ICD-10-CM | POA: Diagnosis not present

## 2015-11-03 DIAGNOSIS — I130 Hypertensive heart and chronic kidney disease with heart failure and stage 1 through stage 4 chronic kidney disease, or unspecified chronic kidney disease: Secondary | ICD-10-CM | POA: Diagnosis not present

## 2015-11-03 DIAGNOSIS — N184 Chronic kidney disease, stage 4 (severe): Secondary | ICD-10-CM | POA: Diagnosis not present

## 2015-11-03 DIAGNOSIS — Z7952 Long term (current) use of systemic steroids: Secondary | ICD-10-CM | POA: Diagnosis not present

## 2015-11-03 DIAGNOSIS — E039 Hypothyroidism, unspecified: Secondary | ICD-10-CM | POA: Diagnosis not present

## 2015-11-03 DIAGNOSIS — Z5181 Encounter for therapeutic drug level monitoring: Secondary | ICD-10-CM | POA: Diagnosis not present

## 2015-11-03 DIAGNOSIS — I82403 Acute embolism and thrombosis of unspecified deep veins of lower extremity, bilateral: Secondary | ICD-10-CM | POA: Diagnosis not present

## 2015-11-03 DIAGNOSIS — Z7901 Long term (current) use of anticoagulants: Secondary | ICD-10-CM | POA: Diagnosis not present

## 2015-11-03 DIAGNOSIS — J441 Chronic obstructive pulmonary disease with (acute) exacerbation: Secondary | ICD-10-CM | POA: Diagnosis not present

## 2015-11-03 DIAGNOSIS — E43 Unspecified severe protein-calorie malnutrition: Secondary | ICD-10-CM | POA: Diagnosis not present

## 2015-11-07 ENCOUNTER — Encounter (HOSPITAL_COMMUNITY): Payer: Self-pay | Admitting: Emergency Medicine

## 2015-11-07 ENCOUNTER — Emergency Department (HOSPITAL_COMMUNITY): Payer: Medicare Other

## 2015-11-07 DIAGNOSIS — Z7901 Long term (current) use of anticoagulants: Secondary | ICD-10-CM | POA: Diagnosis not present

## 2015-11-07 DIAGNOSIS — I5042 Chronic combined systolic (congestive) and diastolic (congestive) heart failure: Secondary | ICD-10-CM | POA: Diagnosis not present

## 2015-11-07 DIAGNOSIS — E860 Dehydration: Secondary | ICD-10-CM | POA: Diagnosis present

## 2015-11-07 DIAGNOSIS — E43 Unspecified severe protein-calorie malnutrition: Secondary | ICD-10-CM | POA: Diagnosis present

## 2015-11-07 DIAGNOSIS — K219 Gastro-esophageal reflux disease without esophagitis: Secondary | ICD-10-CM | POA: Diagnosis present

## 2015-11-07 DIAGNOSIS — E876 Hypokalemia: Secondary | ICD-10-CM | POA: Diagnosis not present

## 2015-11-07 DIAGNOSIS — Z96651 Presence of right artificial knee joint: Secondary | ICD-10-CM | POA: Diagnosis present

## 2015-11-07 DIAGNOSIS — D539 Nutritional anemia, unspecified: Secondary | ICD-10-CM | POA: Diagnosis present

## 2015-11-07 DIAGNOSIS — N184 Chronic kidney disease, stage 4 (severe): Secondary | ICD-10-CM | POA: Diagnosis present

## 2015-11-07 DIAGNOSIS — Z961 Presence of intraocular lens: Secondary | ICD-10-CM | POA: Diagnosis not present

## 2015-11-07 DIAGNOSIS — K921 Melena: Secondary | ICD-10-CM | POA: Diagnosis not present

## 2015-11-07 DIAGNOSIS — N179 Acute kidney failure, unspecified: Secondary | ICD-10-CM | POA: Diagnosis not present

## 2015-11-07 DIAGNOSIS — M109 Gout, unspecified: Secondary | ICD-10-CM | POA: Diagnosis present

## 2015-11-07 DIAGNOSIS — E0590 Thyrotoxicosis, unspecified without thyrotoxic crisis or storm: Secondary | ICD-10-CM | POA: Diagnosis present

## 2015-11-07 DIAGNOSIS — J449 Chronic obstructive pulmonary disease, unspecified: Secondary | ICD-10-CM | POA: Diagnosis present

## 2015-11-07 DIAGNOSIS — Z9981 Dependence on supplemental oxygen: Secondary | ICD-10-CM

## 2015-11-07 DIAGNOSIS — I130 Hypertensive heart and chronic kidney disease with heart failure and stage 1 through stage 4 chronic kidney disease, or unspecified chronic kidney disease: Secondary | ICD-10-CM | POA: Diagnosis not present

## 2015-11-07 DIAGNOSIS — I482 Chronic atrial fibrillation: Secondary | ICD-10-CM | POA: Diagnosis not present

## 2015-11-07 DIAGNOSIS — R531 Weakness: Secondary | ICD-10-CM | POA: Diagnosis not present

## 2015-11-07 DIAGNOSIS — Z79899 Other long term (current) drug therapy: Secondary | ICD-10-CM | POA: Diagnosis not present

## 2015-11-07 DIAGNOSIS — I340 Nonrheumatic mitral (valve) insufficiency: Secondary | ICD-10-CM | POA: Diagnosis present

## 2015-11-07 DIAGNOSIS — M25552 Pain in left hip: Secondary | ICD-10-CM | POA: Diagnosis present

## 2015-11-07 DIAGNOSIS — R079 Chest pain, unspecified: Secondary | ICD-10-CM | POA: Diagnosis not present

## 2015-11-07 DIAGNOSIS — G9341 Metabolic encephalopathy: Secondary | ICD-10-CM | POA: Diagnosis not present

## 2015-11-07 DIAGNOSIS — I472 Ventricular tachycardia: Secondary | ICD-10-CM | POA: Diagnosis not present

## 2015-11-07 DIAGNOSIS — Z681 Body mass index (BMI) 19 or less, adult: Secondary | ICD-10-CM

## 2015-11-07 DIAGNOSIS — Z86718 Personal history of other venous thrombosis and embolism: Secondary | ICD-10-CM

## 2015-11-07 DIAGNOSIS — J9611 Chronic respiratory failure with hypoxia: Secondary | ICD-10-CM | POA: Diagnosis not present

## 2015-11-07 DIAGNOSIS — I2510 Atherosclerotic heart disease of native coronary artery without angina pectoris: Secondary | ICD-10-CM | POA: Diagnosis not present

## 2015-11-07 DIAGNOSIS — Z87891 Personal history of nicotine dependence: Secondary | ICD-10-CM

## 2015-11-07 DIAGNOSIS — R0789 Other chest pain: Secondary | ICD-10-CM | POA: Diagnosis not present

## 2015-11-07 NOTE — ED Triage Notes (Signed)
Patient reports central chest pain radiating to neck pain onset this week with mild SOB ( COPD ) and nausea , denies diaphoresis or fever .

## 2015-11-08 ENCOUNTER — Inpatient Hospital Stay (HOSPITAL_COMMUNITY): Payer: Medicare Other

## 2015-11-08 ENCOUNTER — Inpatient Hospital Stay (HOSPITAL_COMMUNITY)
Admission: EM | Admit: 2015-11-08 | Discharge: 2015-11-13 | DRG: 682 | Disposition: A | Discharge status: Home Health | Payer: Medicare Other | Attending: Internal Medicine | Admitting: Internal Medicine

## 2015-11-08 ENCOUNTER — Encounter (HOSPITAL_COMMUNITY): Payer: Self-pay | Admitting: Family Medicine

## 2015-11-08 DIAGNOSIS — E0590 Thyrotoxicosis, unspecified without thyrotoxic crisis or storm: Secondary | ICD-10-CM

## 2015-11-08 DIAGNOSIS — E876 Hypokalemia: Secondary | ICD-10-CM

## 2015-11-08 DIAGNOSIS — I5042 Chronic combined systolic (congestive) and diastolic (congestive) heart failure: Secondary | ICD-10-CM | POA: Diagnosis not present

## 2015-11-08 DIAGNOSIS — G9341 Metabolic encephalopathy: Secondary | ICD-10-CM | POA: Diagnosis present

## 2015-11-08 DIAGNOSIS — K921 Melena: Secondary | ICD-10-CM | POA: Diagnosis not present

## 2015-11-08 DIAGNOSIS — N184 Chronic kidney disease, stage 4 (severe): Secondary | ICD-10-CM | POA: Diagnosis not present

## 2015-11-08 DIAGNOSIS — Z96651 Presence of right artificial knee joint: Secondary | ICD-10-CM | POA: Diagnosis present

## 2015-11-08 DIAGNOSIS — Z9981 Dependence on supplemental oxygen: Secondary | ICD-10-CM | POA: Diagnosis not present

## 2015-11-08 DIAGNOSIS — Z86718 Personal history of other venous thrombosis and embolism: Secondary | ICD-10-CM | POA: Diagnosis not present

## 2015-11-08 DIAGNOSIS — M25552 Pain in left hip: Secondary | ICD-10-CM | POA: Diagnosis not present

## 2015-11-08 DIAGNOSIS — J449 Chronic obstructive pulmonary disease, unspecified: Secondary | ICD-10-CM | POA: Diagnosis not present

## 2015-11-08 DIAGNOSIS — N179 Acute kidney failure, unspecified: Secondary | ICD-10-CM | POA: Diagnosis not present

## 2015-11-08 DIAGNOSIS — J9611 Chronic respiratory failure with hypoxia: Secondary | ICD-10-CM

## 2015-11-08 DIAGNOSIS — D649 Anemia, unspecified: Secondary | ICD-10-CM | POA: Diagnosis not present

## 2015-11-08 DIAGNOSIS — I4821 Permanent atrial fibrillation: Secondary | ICD-10-CM | POA: Diagnosis present

## 2015-11-08 DIAGNOSIS — E860 Dehydration: Secondary | ICD-10-CM

## 2015-11-08 DIAGNOSIS — N189 Chronic kidney disease, unspecified: Secondary | ICD-10-CM | POA: Diagnosis not present

## 2015-11-08 DIAGNOSIS — I130 Hypertensive heart and chronic kidney disease with heart failure and stage 1 through stage 4 chronic kidney disease, or unspecified chronic kidney disease: Secondary | ICD-10-CM | POA: Diagnosis present

## 2015-11-08 DIAGNOSIS — Z79899 Other long term (current) drug therapy: Secondary | ICD-10-CM | POA: Diagnosis not present

## 2015-11-08 DIAGNOSIS — E43 Unspecified severe protein-calorie malnutrition: Secondary | ICD-10-CM | POA: Diagnosis present

## 2015-11-08 DIAGNOSIS — M1612 Unilateral primary osteoarthritis, left hip: Secondary | ICD-10-CM | POA: Diagnosis not present

## 2015-11-08 DIAGNOSIS — R079 Chest pain, unspecified: Secondary | ICD-10-CM

## 2015-11-08 DIAGNOSIS — J438 Other emphysema: Secondary | ICD-10-CM | POA: Diagnosis not present

## 2015-11-08 DIAGNOSIS — M109 Gout, unspecified: Secondary | ICD-10-CM | POA: Diagnosis present

## 2015-11-08 DIAGNOSIS — Z7901 Long term (current) use of anticoagulants: Secondary | ICD-10-CM | POA: Diagnosis not present

## 2015-11-08 DIAGNOSIS — K219 Gastro-esophageal reflux disease without esophagitis: Secondary | ICD-10-CM | POA: Diagnosis present

## 2015-11-08 DIAGNOSIS — I482 Chronic atrial fibrillation: Secondary | ICD-10-CM | POA: Diagnosis present

## 2015-11-08 DIAGNOSIS — J410 Simple chronic bronchitis: Secondary | ICD-10-CM

## 2015-11-08 DIAGNOSIS — Z961 Presence of intraocular lens: Secondary | ICD-10-CM | POA: Diagnosis present

## 2015-11-08 DIAGNOSIS — I2510 Atherosclerotic heart disease of native coronary artery without angina pectoris: Secondary | ICD-10-CM | POA: Diagnosis present

## 2015-11-08 DIAGNOSIS — D539 Nutritional anemia, unspecified: Secondary | ICD-10-CM | POA: Diagnosis present

## 2015-11-08 DIAGNOSIS — R269 Unspecified abnormalities of gait and mobility: Secondary | ICD-10-CM | POA: Diagnosis not present

## 2015-11-08 DIAGNOSIS — N170 Acute kidney failure with tubular necrosis: Secondary | ICD-10-CM | POA: Diagnosis not present

## 2015-11-08 DIAGNOSIS — J439 Emphysema, unspecified: Secondary | ICD-10-CM

## 2015-11-08 DIAGNOSIS — I10 Essential (primary) hypertension: Secondary | ICD-10-CM | POA: Diagnosis not present

## 2015-11-08 DIAGNOSIS — I472 Ventricular tachycardia: Secondary | ICD-10-CM | POA: Diagnosis not present

## 2015-11-08 DIAGNOSIS — I340 Nonrheumatic mitral (valve) insufficiency: Secondary | ICD-10-CM | POA: Diagnosis present

## 2015-11-08 DIAGNOSIS — R531 Weakness: Secondary | ICD-10-CM

## 2015-11-08 LAB — BASIC METABOLIC PANEL
ANION GAP: 16 — AB (ref 5–15)
Anion gap: 15 (ref 5–15)
BUN: 112 mg/dL — ABNORMAL HIGH (ref 6–20)
BUN: 118 mg/dL — AB (ref 6–20)
CHLORIDE: 90 mmol/L — AB (ref 101–111)
CO2: 29 mmol/L (ref 22–32)
CO2: 34 mmol/L — ABNORMAL HIGH (ref 22–32)
CREATININE: 4.94 mg/dL — AB (ref 0.44–1.00)
Calcium: 10.4 mg/dL — ABNORMAL HIGH (ref 8.9–10.3)
Calcium: 10.5 mg/dL — ABNORMAL HIGH (ref 8.9–10.3)
Chloride: 85 mmol/L — ABNORMAL LOW (ref 101–111)
Creatinine, Ser: 4.7 mg/dL — ABNORMAL HIGH (ref 0.44–1.00)
GFR calc Af Amer: 9 mL/min — ABNORMAL LOW (ref >60)
GFR calc Af Amer: 9 mL/min — ABNORMAL LOW (ref >60)
GFR, EST NON AFRICAN AMERICAN: 8 mL/min — AB (ref >60)
GFR, EST NON AFRICAN AMERICAN: 8 mL/min — AB (ref >60)
GLUCOSE: 148 mg/dL — AB (ref 65–99)
GLUCOSE: 100 mg/dL — AB (ref 65–99)
POTASSIUM: 3.4 mmol/L — AB (ref 3.5–5.1)
POTASSIUM: 2.8 mmol/L — AB (ref 3.5–5.1)
SODIUM: 134 mmol/L — AB (ref 135–145)
Sodium: 135 mmol/L (ref 135–145)

## 2015-11-08 LAB — HEPATIC FUNCTION PANEL
ALBUMIN: 3.8 g/dL (ref 3.5–5.0)
ALK PHOS: 80 U/L (ref 38–126)
ALT: 9 U/L — AB (ref 14–54)
AST: 17 U/L (ref 15–41)
BILIRUBIN TOTAL: 0.6 mg/dL (ref 0.3–1.2)
Bilirubin, Direct: <0.1 mg/dL — ABNORMAL LOW (ref 0.1–0.5)
Total Protein: 7.6 g/dL (ref 6.5–8.1)

## 2015-11-08 LAB — CBC
HEMATOCRIT: 29.2 % — AB (ref 36.0–46.0)
Hemoglobin: 9.5 g/dL — ABNORMAL LOW (ref 12.0–15.0)
MCH: 32.9 pg (ref 26.0–34.0)
MCHC: 32.5 g/dL (ref 30.0–36.0)
MCV: 101 fL — AB (ref 78.0–100.0)
PLATELETS: 234 10*3/uL (ref 150–400)
RBC: 2.89 MIL/uL — ABNORMAL LOW (ref 3.87–5.11)
RDW: 13.7 % (ref 11.5–15.5)
WBC: 5.5 10*3/uL (ref 4.0–10.5)

## 2015-11-08 LAB — URINALYSIS, ROUTINE W REFLEX MICROSCOPIC
BILIRUBIN URINE: NEGATIVE
GLUCOSE, UA: NEGATIVE mg/dL
Ketones, ur: NEGATIVE mg/dL
Leukocytes, UA: NEGATIVE
Nitrite: NEGATIVE
Protein, ur: NEGATIVE mg/dL
SPECIFIC GRAVITY, URINE: 1.01 (ref 1.005–1.030)
pH: 7.5 (ref 5.0–8.0)

## 2015-11-08 LAB — I-STAT TROPONIN, ED: Troponin i, poc: 0.05 ng/mL (ref 0.00–0.08)

## 2015-11-08 LAB — URINE MICROSCOPIC-ADD ON: WBC, UA: NONE SEEN WBC/hpf (ref 0–5)

## 2015-11-08 LAB — MAGNESIUM: MAGNESIUM: 2.5 mg/dL — AB (ref 1.7–2.4)

## 2015-11-08 LAB — TSH: TSH: 2.377 u[IU]/mL (ref 0.350–4.500)

## 2015-11-08 LAB — PROTIME-INR
INR: 1.63
Prothrombin Time: 19.5 seconds — ABNORMAL HIGH (ref 11.4–15.2)

## 2015-11-08 MED ORDER — ONDANSETRON HCL 4 MG PO TABS: 4.0000 mg | ORAL_TABLET | Freq: Four times a day (QID) | ORAL | Status: DC | PRN

## 2015-11-08 MED ORDER — SODIUM CHLORIDE 0.9 % IV BOLUS (SEPSIS)
500.0000 mL | Freq: Once | INTRAVENOUS | Status: AC
  Administered 2015-11-08: 500 mL via INTRAVENOUS

## 2015-11-08 MED ORDER — TRAMADOL HCL 50 MG PO TABS
50.0000 mg | ORAL_TABLET | Freq: Two times a day (BID) | ORAL | Status: DC
  Administered 2015-11-08 – 2015-11-13 (×11): 50 mg via ORAL
  Filled 2015-11-08 (×11): qty 1

## 2015-11-08 MED ORDER — WARFARIN - PHARMACIST DOSING INPATIENT
Freq: Every day | Status: DC
  Administered 2015-11-08 – 2015-11-09 (×2)

## 2015-11-08 MED ORDER — METHIMAZOLE 5 MG PO TABS
5.0000 mg | ORAL_TABLET | Freq: Every day | ORAL | Status: DC
  Administered 2015-11-08 – 2015-11-13 (×6): 5 mg via ORAL
  Filled 2015-11-08 (×5): qty 1
  Filled 2015-11-08 (×3): qty 0.5

## 2015-11-08 MED ORDER — DILTIAZEM HCL ER 60 MG PO CP12
120.0000 mg | ORAL_CAPSULE | Freq: Two times a day (BID) | ORAL | Status: DC
  Administered 2015-11-08 – 2015-11-09 (×4): 120 mg via ORAL
  Filled 2015-11-08 (×5): qty 2

## 2015-11-08 MED ORDER — FENTANYL CITRATE (PF) 100 MCG/2ML IJ SOLN
50.0000 ug | Freq: Once | INTRAMUSCULAR | Status: AC
  Administered 2015-11-08: 50 ug via INTRAVENOUS
  Filled 2015-11-08: qty 2

## 2015-11-08 MED ORDER — ACETAMINOPHEN 325 MG PO TABS
650.0000 mg | ORAL_TABLET | Freq: Four times a day (QID) | ORAL | Status: DC | PRN
  Administered 2015-11-09: 650 mg via ORAL
  Filled 2015-11-08: qty 2

## 2015-11-08 MED ORDER — ALLOPURINOL 100 MG PO TABS
100.0000 mg | ORAL_TABLET | Freq: Every day | ORAL | Status: DC
  Administered 2015-11-08 – 2015-11-13 (×6): 100 mg via ORAL
  Filled 2015-11-08 (×6): qty 1

## 2015-11-08 MED ORDER — SENNA 8.6 MG PO TABS
1.0000 | ORAL_TABLET | Freq: Every day | ORAL | Status: DC
  Administered 2015-11-08 – 2015-11-13 (×6): 8.6 mg via ORAL
  Filled 2015-11-08 (×6): qty 1

## 2015-11-08 MED ORDER — HYDROMORPHONE HCL 2 MG/ML IJ SOLN: 0.5000 mg | INTRAMUSCULAR | Status: DC | PRN

## 2015-11-08 MED ORDER — HYDROMORPHONE HCL 1 MG/ML IJ SOLN
0.5000 mg | INTRAMUSCULAR | Status: DC | PRN
  Administered 2015-11-08 – 2015-11-09 (×4): 0.5 mg via INTRAVENOUS
  Filled 2015-11-08 (×4): qty 1

## 2015-11-08 MED ORDER — WARFARIN SODIUM 2 MG PO TABS
4.0000 mg | ORAL_TABLET | Freq: Once | ORAL | Status: AC
  Administered 2015-11-08: 4 mg via ORAL
  Filled 2015-11-08: qty 2

## 2015-11-08 MED ORDER — HEPARIN SODIUM (PORCINE) 5000 UNIT/ML IJ SOLN: 5000.0000 [IU] | Freq: Three times a day (TID) | INTRAMUSCULAR | Status: DC

## 2015-11-08 MED ORDER — METOPROLOL TARTRATE 12.5 MG HALF TABLET
12.5000 mg | ORAL_TABLET | Freq: Two times a day (BID) | ORAL | Status: DC
  Administered 2015-11-08 – 2015-11-13 (×11): 12.5 mg via ORAL
  Filled 2015-11-08 (×11): qty 1

## 2015-11-08 MED ORDER — ONDANSETRON HCL 4 MG/2ML IJ SOLN: 4.0000 mg | Freq: Four times a day (QID) | INTRAMUSCULAR | Status: DC | PRN

## 2015-11-08 MED ORDER — MORPHINE SULFATE (PF) 4 MG/ML IV SOLN: 2.0000 mg | INTRAVENOUS | Status: DC | PRN

## 2015-11-08 MED ORDER — ACETAMINOPHEN 650 MG RE SUPP: 650.0000 mg | Freq: Four times a day (QID) | RECTAL | Status: DC | PRN

## 2015-11-08 MED ORDER — ALBUTEROL SULFATE (2.5 MG/3ML) 0.083% IN NEBU: 2.5000 mg | INHALATION_SOLUTION | RESPIRATORY_TRACT | Status: DC | PRN

## 2015-11-08 MED ORDER — PANTOPRAZOLE SODIUM 40 MG PO TBEC
40.0000 mg | DELAYED_RELEASE_TABLET | Freq: Every day | ORAL | Status: DC
  Administered 2015-11-08 – 2015-11-13 (×6): 40 mg via ORAL
  Filled 2015-11-08 (×6): qty 1

## 2015-11-08 MED ORDER — POTASSIUM CHLORIDE CRYS ER 20 MEQ PO TBCR
40.0000 meq | EXTENDED_RELEASE_TABLET | Freq: Once | ORAL | Status: AC
  Administered 2015-11-08: 40 meq via ORAL
  Filled 2015-11-08: qty 2

## 2015-11-08 MED ORDER — ENSURE ENLIVE PO LIQD
237.0000 mL | ORAL | Status: DC
  Administered 2015-11-08 – 2015-11-13 (×6): 237 mL via ORAL

## 2015-11-08 MED ORDER — FLUTICASONE FUROATE-VILANTEROL 100-25 MCG/INH IN AEPB
1.0000 | INHALATION_SPRAY | Freq: Every day | RESPIRATORY_TRACT | Status: DC
  Administered 2015-11-08 – 2015-11-13 (×3): 1 via RESPIRATORY_TRACT
  Filled 2015-11-08: qty 28

## 2015-11-08 NOTE — Progress Notes (Addendum)
PROGRESS NOTE  [name] Alexander ZOX:[id number] DOB: 06-Dec-1933 DOA: 11/08/2015 PCP: Laurena SlimmerLARK,[name], MD  Brief History:  82 year old female with a history of systolic and diastolic CHF, COPD on 3 L nasal cannula, CKD stage IV, DVT on warfarin, atrial fibrillation, hyperthyroidism on methimazole presented with one-week history of malaise, decrease urine output, nausea, and anorexia. The patient saw her cardiologist, Dr. Royann Shiversroitoru, on 10/17/2015. At that time, the patient was instructed to continue furosemide 60 mg TID and her metolazone was increased to three times per week which was an increase from once per week.  The patient was instructed to continue the above-mentioned furosemide dose until she reached a target weight of 105 lbs. according to the patient's son, the patient reached this target weight on approximately 10/21/2015 which was the day that she switched furosemide 80 mg twice a day. She continued on metolazone 2.5 mg on Monday, Wednesday, Friday.  The patient's office weight on 10/17/2015 was 106 pounds. Even as the patient began having malaise, nausea, and decreased urine output over the past 7 days prior to admission, the patient continued to take the diuretic regimen as discussed.  Patient denies fevers, chills, headache, chest pain,  vomiting, diarrhea, abdominal pain, dysuria, hematuria, hematochezia, and melena.  The patient's son states that her LE edema is the best that it has ever been.  Upon presentation, the patient was started on potassium 2.8 with serum creatinine 4.94. WBC was 5.5 with hemoglobin 9.5. Urinalysis was negative for pyuria. The patient was given 500 mL IV fluid.   Assessment/Plan: Acute on chronic renal failure--CKD stage IV -Baseline creatinine 2.8-3.1 -Presenting creatinine 4.94 -Hold diuretics -Repeat BMP in the morning -Renal ultrasound -Urinalysis bland -hold bicarbonate  Chronic systolic and diastolic CHF -Dry weight approximately 104-105  per records -appears euvolemic -hold diuretics today and repeat BMP in am -07/07/2015 echocardiogram EF 45-50%, moderate MR/TR, PASP 44  Acute metabolic Encephalopathy -secondary to acute on chronic renal failure -TSH 2.377 -B12 -ammonia  Atrial Fibrillation -CHADSVASc = 5 -continue warfarin -rate controlled -continue metoprolol tartrate and diltiazem  Chronic respiratory failure with hypoxia/COPD -Stable on 3 L -continue Breo  ?Hematochezia -family verbalized "blood in panties" -they do not want me to perform rectal exam -check FOBT -Hgb at baseline  Left hip pain -xray left hip  HTN -BP well controlled on metoprolol and diltiazem  Hyperthyroidism -Continue methimazole  Severe protein calorie malnutrition -Continue supplements    Disposition Plan:   Home in 1-2 days  Family Communication:   Son updated at bedside--Total time spent 40 minutes.  Greater than 50% spent face to face counseling and coordinating care.  1300-1340   Consultants:  none  Code Status:  FULL / DNR  DVT Prophylaxis:  [place] Heparin / [place] Lovenox   Procedures: As Listed in Progress Note Above  Antibiotics: None    Subjective: Patient denies fevers, chills, headache, chest pain, dyspnea, nausea, vomiting, diarrhea, abdominal pain, dysuria, hematuria, hematochezia, and melena.   Objective: Vitals:   11/08/15 0330 11/08/15 0425 11/08/15 0826 11/08/15 1339  BP: 116/71 123/61  (!) 100/56  Pulse: 73 68  61  Resp: 13 20  20   Temp:  98.2 F (36.8 C)  97.9 F (36.6 C)  TempSrc:  Oral  Oral  SpO2: 100% 100% 98% 100%  Weight:  45.8 kg (100 lb 14.4 oz)    Height:  5\' 4"  (1.626 m)      Intake/Output Summary (Last 24 hours)  at 11/08/15 1646 Last data filed at 11/08/15 1559  Gross per 24 hour  Intake              740 ml  Output             1050 ml  Net             -310 ml   Weight change:  Exam:   General:  Pt is alert, follows commands appropriately, not in acute  distress  HEENT: No icterus, No thrush, No neck mass, [place]/AT  Cardiovascular: IRRR, S1/S2, no rubs, no gallops  Respiratory: Bibasilar crackles. No wheezes. Good air movement  Abdomen: Soft/+BS, non tender, non distended, no guarding  Extremities: trace LE edema, No lymphangitis, No petechiae, No rashes, no synovitis   Data Reviewed: I have personally reviewed following labs and imaging studies Basic Metabolic Panel:  Recent Labs Lab 11/07/15 2343 11/08/15 0556 11/08/15 1348  NA 134*  --  135  K 2.8*  --  3.4*  CL 85*  --  90*  CO2 34*  --  29  GLUCOSE 100*  --  148*  BUN 118*  --  112*  CREATININE 4.94*  --  4.70*  CALCIUM 10.4*  --  10.5*  MG  --  2.5*  --    Liver Function Tests:  Recent Labs Lab 11/08/15 0223  AST 17  ALT 9*  ALKPHOS 80  BILITOT 0.6  PROT 7.6  ALBUMIN 3.8   No results for input(s): LIPASE, AMYLASE in the last 168 hours. No results for input(s): AMMONIA in the last 168 hours. Coagulation Profile:  Recent Labs Lab 11/07/15 2343  INR 1.63   CBC:  Recent Labs Lab 11/07/15 2343  WBC 5.5  HGB 9.5*  HCT 29.2*  MCV 101.0*  PLT 234   Cardiac Enzymes: No results for input(s): CKTOTAL, CKMB, CKMBINDEX, TROPONINI in the last 168 hours. BNP: Invalid input(s): POCBNP CBG: No results for input(s): GLUCAP in the last 168 hours. HbA1C: No results for input(s): HGBA1C in the last 72 hours. Urine analysis:    Component Value Date/Time   COLORURINE YELLOW 11/08/2015 0518   APPEARANCEUR CLEAR 11/08/2015 0518   LABSPEC 1.010 11/08/2015 0518   PHURINE 7.5 11/08/2015 0518   GLUCOSEU NEGATIVE 11/08/2015 0518   HGBUR SMALL (A) 11/08/2015 0518   BILIRUBINUR NEGATIVE 11/08/2015 0518   KETONESUR NEGATIVE 11/08/2015 0518   PROTEINUR NEGATIVE 11/08/2015 0518   UROBILINOGEN 0.2 12/12/2013 0414   NITRITE NEGATIVE 11/08/2015 0518   LEUKOCYTESUR NEGATIVE 11/08/2015 0518   Sepsis Labs: @LABRCNTIP (procalcitonin:4,lacticidven:4) )No results  found for this or any previous visit (from the past 240 hour(s)).   Scheduled Meds: . allopurinol  100 mg Oral Daily  . diltiazem  120 mg Oral BID  . feeding supplement (ENSURE ENLIVE)  237 mL Oral Q24H  . fluticasone furoate-vilanterol  1 puff Inhalation Daily  . methimazole  5 mg Oral Daily  . metoprolol tartrate  12.5 mg Oral BID  . pantoprazole  40 mg Oral Daily  . senna  1 tablet Oral Daily  . traMADol  50 mg Oral BID  . warfarin  4 mg Oral ONCE-1800  . Warfarin - Pharmacist Dosing Inpatient   Does not apply q1800   Continuous Infusions:   Procedures/Studies: Dg Chest 2 View  Result Date: 11/08/2015 CLINICAL DATA:  COPD and dyspnea. Central chest pain radiating to back for 1 week. EXAM: CHEST  2 VIEW COMPARISON:  09/29/2015 FINDINGS: There is stable cardiomegaly with  atherosclerosis of the aortic arch. Clearing of right upper and right lower lobe infiltrate since prior exam. Chronic scarring and/or atelectasis at the left lung base. The lungs are slightly hyperinflated. No overt pulmonary edema, effusion or pneumothorax. Osteoarthritis of the glenohumeral joints with subchondral cystic lucencies of both humeral heads. IMPRESSION: Hyperinflated lungs with stable cardiomegaly. Aortic atherosclerosis. Osteoarthritis of both humeral heads. Electronically Signed   By: Tollie Ethavid  Kwon M.D.   On: 11/08/2015 00:41    Seretha Estabrooks, DO  [facility] Pager [phone]  If 7PM-7AM, please contact night-coverage [email or web address] Password [email or web address] 11/08/2015, 4:46 PM   LOS: 0 days

## 2015-11-08 NOTE — ED Notes (Signed)
Admitting at Bedside.  

## 2015-11-08 NOTE — H&P (Signed)
History and Physical  Patient Name: [name] Alexander     WUJ:[id number]    DOB: June 18, 1933    DOA: 11/08/2015 PCP: [name], MD   Patient coming from: Home  Chief Complaint: Weakness  HPI: [name] Alexander is a 82 y.o. female with a past medical history significant for CHF EF 45%, COPD on 3L home O2, anemia, CKD IV baseline Cr 3.0, chronic DVT on warfarin, Afib and hyperthyroidism on methimazole who presents with one week weakness, malaise, decreased UOP, and nausea.  Since her last hospitalization, the patient has been back home, living with her son and daughter-in-law, able to get usually from one room to the next with her walker. 3 weeks ago, she saw her cardiologist, was fluid overloaded, treated with metolazone added to 3 times a day furosemide, and improved after a few days.  Since then she's been asymptomatic until about one week ago, when family noticed that she was weaker than usual, complained of vague malaise, nausea, decreased appetite, was not urinating as much as usual, and having "spells" of feeling bad, fanning herself for air, and seeming disoriented that would last for a few minutes to an hour, then today she had heart burn and the sensation that she had to clear her throat, so today they brought her to the ER.  ED course: -Afebrile, heart rate 70s, respirations and pulse oximetry normal on 3 L home oxygen, blood pressure 125/70 -Na 134, K 2.8, Cr 4.94 (baseline 2.5-3), WBC 5.5 K, Hgb 9.5, macrocytic, stable -Bicarbonate 34 -Chest x-ray showed no edema or pneumonia -ECG showed A. fib, sinus rhythm -She was given 40 mEq oral K and 500 cc normal saline and TRH were asked to evaluate for weakness and AKI      Recent admissions: 3/7-3/9: Admitted for dyspnea, multifactorial, treated for COPD 6/24-6/29: Admitted for CHF, diuresed, palliative care eval 7/20-7/22: Admitted for CHF, diuresed 9/17-9/19: Admitted for CHF and CAP, diuresed and treated with Levaquin 10/6:  Seen by CHF team in office, fluid overloaded, treated with increased diuretics    ROS: Review of Systems  Constitutional: Positive for malaise/fatigue. Negative for chills and fever.  Respiratory: Negative for cough and shortness of breath.   Cardiovascular: Positive for chest pain. Negative for orthopnea, leg swelling and PND.  Gastrointestinal: Positive for heartburn and nausea.  Neurological: Positive for weakness.  All other systems reviewed and are negative.         Past Medical History:  Diagnosis Date  . Arthritis    "hips, knees, shoulders" (07/31/2015)  . Chronic diastolic CHF (congestive heart failure), NYHA class 2 (HCC)   . CKD (chronic kidney disease), stage IV (HCC)   . COPD (chronic obstructive pulmonary disease) (HCC)   . DVT (deep venous thrombosis) (HCC) "2-3 times"  . GERD (gastroesophageal reflux disease)   . Gout   . Hypertension   . Hyperthyroidism   . On home oxygen therapy    "2L; 24/7" (07/31/2015)  . Pneumonia   . Thyroid disease     Past Surgical History:  Procedure Laterality Date  . CATARACT EXTRACTION W/ INTRAOCULAR LENS  IMPLANT, BILATERAL Bilateral   . JOINT REPLACEMENT    . TOTAL KNEE ARTHROPLASTY Right 06/2004   [name] 05/26/2010  . VAGINAL HYSTERECTOMY      Social History: Patient lives with her son and daughter in law.  The patient walks with a walker.  She can usually walk short distances.  She is a former smoker, quit within the last few  years.  [place]  She worked at [employer] for many years.    No Known Allergies  Family history: Parents deceased, healthy.  Prior to Admission medications   Medication Sig Start Date End Date Taking? Authorizing Provider  acetaminophen (TYLENOL) 650 MG CR tablet Take 1,300 mg by mouth 2 (two) times daily.    Historical Provider, MD  albuterol (PROVENTIL HFA;VENTOLIN HFA) 108 (90 Base) MCG/ACT inhaler Inhale 2 puffs into the lungs every 6 (six) hours as needed for wheezing or  shortness of breath. 05/16/15   Mihai Croitoru, MD  allopurinol (ZYLOPRIM) 100 MG tablet Take 1 tablet (100 mg total) by mouth every morning. Patient taking differently: Take 100 mg by mouth daily.  07/10/15   Jonah BlueJennifer Yates, MD  diltiazem (CARDIZEM SR) 120 MG 12 hr capsule Take 1 capsule (120 mg total) by mouth 2 (two) times daily. 06/02/15   Mihai Croitoru, MD  feeding supplement, ENSURE COMPLETE, (ENSURE COMPLETE) LIQD Take 237 mLs by mouth 2 (two) times daily between meals. Patient taking differently: Take 237 mLs by mouth daily.  12/18/13   Nishant Dhungel, MD  fluticasone furoate-vilanterol (BREO ELLIPTA) 100-25 MCG/INH AEPB Inhale 1 puff into the lungs daily as needed (shortness of breath).     Historical Provider, MD  furosemide (LASIX) 20 MG tablet Take 4 tablets (80 mg total) by mouth 2 (two) times daily. 10/17/15   Mihai Croitoru, MD  levofloxacin (LEVAQUIN) 500 MG tablet Take 1 tablet (500 mg total) by mouth every other day. 09/30/15   Richarda OverlieNayana Abrol, MD  methimazole (TAPAZOLE) 5 MG tablet Take 5 mg by mouth daily.     Historical Provider, MD  metolazone (ZAROXOLYN) 2.5 MG tablet Take 1 tablet (2.5 mg total) by mouth 3 (three) times a week. Mondays, Wednesdays, and Fridays 10/17/15   Mihai Croitoru, MD  metoprolol tartrate (LOPRESSOR) 25 MG tablet Take 0.5 tablets (12.5 mg total) by mouth 2 (two) times daily. 10/17/15   Mihai Croitoru, MD  OXYGEN Inhale 2 L into the lungs continuous.    Historical Provider, MD  pantoprazole (PROTONIX) 40 MG tablet Take 1 tablet (40 mg total) by mouth daily. 07/10/15   Jonah BlueJennifer Yates, MD  senna (SENOKOT) 8.6 MG TABS tablet Take 1 tablet (8.6 mg total) by mouth daily. 07/10/15   Jonah BlueJennifer Yates, MD  sodium bicarbonate 650 MG tablet Take 1 tablet (650 mg total) by mouth 3 (three) times daily. Patient taking differently: Take 650 mg by mouth 2 (two) times daily.  03/20/15   Osvaldo ShipperGokul Krishnan, MD  traMADol (ULTRAM) 50 MG tablet Take 50-100 mg by mouth 2 (two) times daily.   10/31/12   Historical Provider, MD  warfarin (COUMADIN) 1 MG tablet Take 2-3 mg by mouth See admin instructions. Take 2 tablets (2 mg) by mouth on Monday and Thursday at 6pm, take 3 tablets (3 mg) on Sunday, Tuesday, Wednesday, Friday and Saturday at 6pm 09/15/15   Historical Provider, MD       Physical Exam: BP 123/62   Pulse 75   Temp 97.9 F (36.6 C) (Oral)   Resp 22   SpO2 100%  General appearance: Frail elderly adult female, awake and alert, in occasional pain from her hip, otherwise no distress. Eyes: Anicteric, conjunctiva pink, lids and lashes normal. PERRL.    ENT: No nasal deformity, discharge, epistaxis.  Hearing mildly impaired. OP tacky dry without lesions.   Neck: No neck masses.  Trachea midline.  No thyromegaly/tenderness. Lymph: No cervical or supraclavicular lymphadenopathy.  Skin: Warm and dry.  No jaundice.  No suspicious rashes or lesions. Cardiac: RRR, nl S1-S2, holosystolic mrumur.  Capillary refill is brisk.  No LE edema.  Radial and DP pulses 2+ and symmetric. Respiratory: Normal work of breathing.  CTAB without rales or wheezes. Abdomen: Abdomen soft.  No TTP. No ascites, distension, hepatosplenomegaly.   MSK: No deformities or effusions.  No cyanosis or clubbing. Neuro: Cranial nerves normal.  Sensation intact to light touch. Speech is fluent.  Muscle strength globally weak.    Psych: Sensorium intact and responding to questions, attention normal.  Behavior appropriate.  Affect normal.  Judgment and insight appear normal.     Labs on Admission:  I have personally reviewed following labs and imaging studies: CBC:  Recent Labs Lab 11/07/15 2343  WBC 5.5  HGB 9.5*  HCT 29.2*  MCV 101.0*  PLT 234   Basic Metabolic Panel:  Recent Labs Lab 11/07/15 2343  NA 134*  K 2.8*  CL 85*  CO2 34*  GLUCOSE 100*  BUN 118*  CREATININE 4.94*  CALCIUM 10.4*   GFR: CrCl cannot be calculated (Unknown ideal weight.).  Liver Function Tests:  Recent  Labs Lab 11/08/15 0223  AST 17  ALT 9*  ALKPHOS 80  BILITOT 0.6  PROT 7.6  ALBUMIN 3.8   No results for input(s): LIPASE, AMYLASE in the last 168 hours. No results for input(s): AMMONIA in the last 168 hours. Coagulation Profile:  Recent Labs Lab 11/07/15 2343  INR 1.63   Cardiac Enzymes: No results for input(s): CKTOTAL, CKMB, CKMBINDEX, TROPONINI in the last 168 hours. BNP (last 3 results) No results for input(s): PROBNP in the last 8760 hours. HbA1C: No results for input(s): HGBA1C in the last 72 hours. CBG: No results for input(s): GLUCAP in the last 168 hours. Lipid Profile: No results for input(s): CHOL, HDL, LDLCALC, TRIG, CHOLHDL, LDLDIRECT in the last 72 hours. Thyroid Function Tests: No results for input(s): TSH, T4TOTAL, FREET4, T3FREE, THYROIDAB in the last 72 hours. Anemia Panel: No results for input(s): VITAMINB12, FOLATE, FERRITIN, TIBC, IRON, RETICCTPCT in the last 72 hours. Sepsis Labs: Invalid input(s): PROCALCITONIN, LACTICIDVEN No results found for this or any previous visit (from the past 240 hour(s)).       Radiological Exams on Admission: Personally reviewed: Dg Chest 2 View  Result Date: 11/08/2015 CLINICAL DATA:  COPD and dyspnea. Central chest pain radiating to back for 1 week. EXAM: CHEST  2 VIEW COMPARISON:  09/29/2015 FINDINGS: There is stable cardiomegaly with atherosclerosis of the aortic arch. Clearing of right upper and right lower lobe infiltrate since prior exam. Chronic scarring and/or atelectasis at the left lung base. The lungs are slightly hyperinflated. No overt pulmonary edema, effusion or pneumothorax. Osteoarthritis of the glenohumeral joints with subchondral cystic lucencies of both humeral heads. IMPRESSION: Hyperinflated lungs with stable cardiomegaly. Aortic atherosclerosis. Osteoarthritis of both humeral heads. Electronically Signed   By: Tollie Eth M.D.   On: 11/08/2015 00:41    EKG: Independently reviewed. Rate 68, QTc  normal, atrial fibrillation, occasional PVCs.  No ischemic changes.  Echocardiogram 2017: EF 45-50% Moderate MR PA pressure 44        Assessment/Plan  1. Acute on chronic renal failure/Cardiorenal syndrome:  Small fluid bolus already given in ER.     -Hold furosemide today -Consult to CHF team, appreciate cares -Hold bicarbonate given bicarb 34     2. Chronic systolic and diastolic CHF:  Appears somewhat dry, weight down to 106 lbs.  Recently on furosemide TID and metolazone, now back to furosemide BID per family. -Hold furosemide  3. Afib:  CHADS2Vasc 5.  On warfarin and rate control. -Continue warfarin -Continue diltiazem and metoprolol  4. Protein-calorie malnutrition, severe:  -Continue supplements  5. COPD:  -Continue Breo  6. Anemia, macrocytic:  Stable.  7. HTN:  -Continue diltiazem and metoprolol  8. Hyperthyroidism: -Continue methimazole -Recheck TSH  9. Other medications: -Continue PPI -Continue senna      DVT prophylaxis: N/A on warfarin  Code Status: FULL  Family Communication: Son and daughter in [place].  Disposition Plan: Anticipate Cardiology consultation and monitor renal function. Consults called: None overnight Admission status: INPATIENT, telemetry    Medical decision making: Patient seen at 2:45 AM on 11/08/2015.  The patient was discussed with Marlon Peliffany Greene, PA-C.  What exists of the patient's chart was reviewed in depth and summarized above.  Clinical condition: stable.        Alberteen SamChristopher P Danford [facility] Pager [phone]

## 2015-11-08 NOTE — Progress Notes (Signed)
ANTICOAGULATION CONSULT NOTE - Initial Consult  Pharmacy Consult for Heparin  Indication: atrial fibrillation, hx DVT  No Known Allergies  Vital Signs: Temp: 97.9 F (36.6 C) (10/27 2332) Temp Source: Oral (10/27 2332) BP: 116/71 (10/28 0330) Pulse Rate: 73 (10/28 0330)  Labs:  Recent Labs  11/07/15 2343  HGB 9.5*  HCT 29.2*  PLT 234  LABPROT 19.5*  INR 1.63  CREATININE 4.94*    CrCl cannot be calculated (Unknown ideal weight.).   Medical History: Past Medical History:  Diagnosis Date  . Arthritis    "hips, knees, shoulders" (07/31/2015)  . Chronic diastolic CHF (congestive heart failure), NYHA class 2 (HCC)   . CKD (chronic kidney disease), stage IV (HCC)   . COPD (chronic obstructive pulmonary disease) (HCC)   . DVT (deep venous thrombosis) (HCC) "2-3 times"  . GERD (gastroesophageal reflux disease)   . Gout   . Hypertension   . Hyperthyroidism   . On home oxygen therapy    "2L; 24/7" (07/31/2015)  . Pneumonia   . Thyroid disease     Assessment: [name] on warfarin PTA for afib and hx DVT. INR is sub-therapeutic at 1.63 on admit. Hgb 9.5. Noted renal dysfunction.   Goal of Therapy:  INR 2-3 Monitor platelets by anticoagulation protocol: Yes   Plan:  -Warfarin 4 mg PO x 1 this evening at 1800 -Daily PT/INR -Monitor for bleeding -DC subcutaneous heparin when INR >2  Abran DukeLedford, Cearra Portnoy 11/08/2015,4:27 AM

## 2015-11-08 NOTE — ED Provider Notes (Signed)
[facility]-EMERGENCY DEPT Provider Note   CSN: [id number] Arrival date & time: 11/07/15  2317     History   Chief Complaint Chief Complaint  Patient presents with  . Chest Pain  . COPD    HPI [name] Alexander is a 82 y.o. female.  HPI  Pt has a PMH of arthritis, chronic diastolic heart failure, CKD, COPD, DVT, GERD, gout, hypertension, at home O2 therapy 2L [place] comes to the ER with feeling week, no bowel movement in 3 days, CP/burning and mild SOB since this past Saturday. The patient feels like it might be GERD, the family has decided to bring her in as she was not wanting to get out of her chair today because of the weakness. She has not had fever, cough, N/V/D. She has hip pain to the left which she says has been a long term problem.  She takes Warfarin for a. Fib Lasix for her CHF  Past Medical History:  Diagnosis Date  . Arthritis    "hips, knees, shoulders" (07/31/2015)  . Chronic diastolic CHF (congestive heart failure), NYHA class 2 (HCC)   . CKD (chronic kidney disease), stage IV (HCC)   . COPD (chronic obstructive pulmonary disease) (HCC)   . DVT (deep venous thrombosis) (HCC) "2-3 times"  . GERD (gastroesophageal reflux disease)   . Gout   . Hypertension   . Hyperthyroidism   . On home oxygen therapy    "2L; 24/7" (07/31/2015)  . Pneumonia   . Thyroid disease     Patient Active Problem List   Diagnosis Date Noted  . Coronary atherosclerosis 10/18/2015  . HCAP (healthcare-associated pneumonia) 09/28/2015  . Acute on chronic systolic congestive heart failure (HCC)   . CHF (congestive heart failure) (HCC) 07/31/2015  . Goals of care, counseling/discussion   . Palliative care encounter   . Acute on chronic congestive heart failure (HCC)   . Atrial fibrillation (HCC)   . Acute on chronic combined systolic and diastolic heart failure (HCC) 07/05/2015  . COPD (chronic obstructive pulmonary disease) (HCC) 05/17/2015  . Chronic combined systolic and diastolic CHF  (congestive heart failure) (HCC) 05/17/2015  . Protein-calorie malnutrition, severe 03/20/2015  . Dyspnea 03/18/2015  . COPD exacerbation (HCC) 03/18/2015  . CKD (chronic kidney disease), stage IV (HCC) 03/18/2015  . Acute respiratory failure with hypoxia (HCC) 03/18/2015  . Tobacco abuse 03/18/2015  . DVT, bilateral lower limbs (HCC)   . Fluid overload 12/12/2013  . CKD (chronic kidney disease) stage 4, GFR 15-29 ml/min (HCC) 12/12/2013  . Normocytic anemia 12/12/2013  . Edema 12/12/2013  . CKD (chronic kidney disease), stage III 06/12/2011  . Community acquired pneumonia 06/11/2011  . ARF (acute renal failure) (HCC) 06/11/2011  . Hypercalcemia 06/11/2011  . Hyperthyroidism 06/11/2011  . Glaucoma 06/11/2011  . DVT (deep venous thrombosis) (HCC) 06/11/2011  . A-fib (HCC) 06/11/2011  . Hypertension   . Gout     Past Surgical History:  Procedure Laterality Date  . CATARACT EXTRACTION W/ INTRAOCULAR LENS  IMPLANT, BILATERAL Bilateral   . JOINT REPLACEMENT    . TOTAL KNEE ARTHROPLASTY Right 06/2004   [name]/notes 05/26/2010  . VAGINAL HYSTERECTOMY      OB History    No data available       Home Medications    Prior to Admission medications   Medication Sig Start Date End Date Taking? Authorizing Provider  acetaminophen (TYLENOL) 650 MG CR tablet Take 1,300 mg by mouth 2 (two) times daily.    Historical Provider,  MD  albuterol (PROVENTIL HFA;VENTOLIN HFA) 108 (90 Base) MCG/ACT inhaler Inhale 2 puffs into the lungs every 6 (six) hours as needed for wheezing or shortness of breath. 05/16/15   Mihai Croitoru, MD  allopurinol (ZYLOPRIM) 100 MG tablet Take 1 tablet (100 mg total) by mouth every morning. Patient taking differently: Take 100 mg by mouth daily.  07/10/15   Jonah Blue, MD  diltiazem (CARDIZEM SR) 120 MG 12 hr capsule Take 1 capsule (120 mg total) by mouth 2 (two) times daily. 06/02/15   Mihai Croitoru, MD  feeding supplement, ENSURE COMPLETE, (ENSURE COMPLETE) LIQD Take 237  mLs by mouth 2 (two) times daily between meals. Patient taking differently: Take 237 mLs by mouth daily.  12/18/13   Nishant Dhungel, MD  fluticasone furoate-vilanterol (BREO ELLIPTA) 100-25 MCG/INH AEPB Inhale 1 puff into the lungs daily as needed (shortness of breath).     Historical Provider, MD  furosemide (LASIX) 20 MG tablet Take 4 tablets (80 mg total) by mouth 2 (two) times daily. 10/17/15   Mihai Croitoru, MD  levofloxacin (LEVAQUIN) 500 MG tablet Take 1 tablet (500 mg total) by mouth every other day. 09/30/15   Richarda Overlie, MD  methimazole (TAPAZOLE) 5 MG tablet Take 5 mg by mouth daily.     Historical Provider, MD  metolazone (ZAROXOLYN) 2.5 MG tablet Take 1 tablet (2.5 mg total) by mouth 3 (three) times a week. Mondays, Wednesdays, and Fridays 10/17/15   Mihai Croitoru, MD  metoprolol tartrate (LOPRESSOR) 25 MG tablet Take 0.5 tablets (12.5 mg total) by mouth 2 (two) times daily. 10/17/15   Mihai Croitoru, MD  OXYGEN Inhale 2 L into the lungs continuous.    Historical Provider, MD  pantoprazole (PROTONIX) 40 MG tablet Take 1 tablet (40 mg total) by mouth daily. 07/10/15   Jonah Blue, MD  senna (SENOKOT) 8.6 MG TABS tablet Take 1 tablet (8.6 mg total) by mouth daily. 07/10/15   Jonah Blue, MD  sodium bicarbonate 650 MG tablet Take 1 tablet (650 mg total) by mouth 3 (three) times daily. Patient taking differently: Take 650 mg by mouth 2 (two) times daily.  03/20/15   Osvaldo Shipper, MD  traMADol (ULTRAM) 50 MG tablet Take 50-100 mg by mouth 2 (two) times daily.  10/31/12   Historical Provider, MD  warfarin (COUMADIN) 1 MG tablet Take 2-3 mg by mouth See admin instructions. Take 2 tablets (2 mg) by mouth on Monday and Thursday at 6pm, take 3 tablets (3 mg) on Sunday, Tuesday, Wednesday, Friday and Saturday at 6pm 09/15/15   Historical Provider, MD    Family History Family History  Problem Relation Age of Onset  . Stroke Neg Hx   . CAD Neg Hx     Social History Social History    Substance Use Topics  . Smoking status: Former Smoker    Packs/day: 2.00    Years: 50.00    Types: Cigarettes    Quit date: 03/12/2015  . Smokeless tobacco: Never Used  . Alcohol use No     Allergies   Review of patient's allergies indicates no known allergies.   Review of Systems Review of Systems  Review of Systems All other systems negative except as documented in the HPI. All pertinent positives and negatives as reviewed in the HPI.  Physical Exam Updated Vital Signs BP 133/84 (BP Location: Left Arm)   Pulse 75   Temp 97.9 F (36.6 C) (Oral)   Resp 17   SpO2 100%   Physical Exam  Constitutional: She appears well-developed and well-nourished. No distress.  HENT:  Head: Normocephalic and atraumatic.  Right Ear: Tympanic membrane and ear canal normal.  Left Ear: Tympanic membrane and ear canal normal.  Nose: Nose normal.  Mouth/Throat: Uvula is midline and oropharynx is clear and moist. Mucous membranes are dry.  Eyes: Pupils are equal, round, and reactive to light.  Neck: Normal range of motion. Neck supple.  Cardiovascular: Normal rate and regular rhythm.   Pulmonary/Chest: Effort normal.    Abdominal: Soft.  No signs of abdominal distention  Musculoskeletal:  No LE swelling Left hip pain  Neurological: She is alert.  Acting at baseline  Skin: Skin is warm and dry. No rash noted.  Nursing note and vitals reviewed.    ED Treatments / Results  Labs (all labs ordered are listed, but only abnormal results are displayed) Labs Reviewed  BASIC METABOLIC PANEL - Abnormal; Notable for the following:       Result Value   Sodium 134 (*)    Potassium 2.8 (*)    Chloride 85 (*)    CO2 34 (*)    Glucose, Bld 100 (*)    BUN 118 (*)    Creatinine, Ser 4.94 (*)    Calcium 10.4 (*)    GFR calc non Af Amer 8 (*)    GFR calc Af Amer 9 (*)    All other components within normal limits  CBC - Abnormal; Notable for the following:    RBC 2.89 (*)    Hemoglobin 9.5  (*)    HCT 29.2 (*)    MCV 101.0 (*)    All other components within normal limits  PROTIME-INR - Abnormal; Notable for the following:    Prothrombin Time 19.5 (*)    All other components within normal limits  HEPATIC FUNCTION PANEL  URINALYSIS, ROUTINE W REFLEX MICROSCOPIC (NOT AT [facility])  Rosezena Sensor, ED    EKG  EKG Interpretation None       Radiology Dg Chest 2 View  Result Date: 11/08/2015 CLINICAL DATA:  COPD and dyspnea. Central chest pain radiating to back for 1 week. EXAM: CHEST  2 VIEW COMPARISON:  09/29/2015 FINDINGS: There is stable cardiomegaly with atherosclerosis of the aortic arch. Clearing of right upper and right lower lobe infiltrate since prior exam. Chronic scarring and/or atelectasis at the left lung base. The lungs are slightly hyperinflated. No overt pulmonary edema, effusion or pneumothorax. Osteoarthritis of the glenohumeral joints with subchondral cystic lucencies of both humeral heads. IMPRESSION: Hyperinflated lungs with stable cardiomegaly. Aortic atherosclerosis. Osteoarthritis of both humeral heads. Electronically Signed   By: Tollie Eth M.D.   On: 11/08/2015 00:41    Procedures Procedures (including critical care time)  Medications Ordered in ED Medications  sodium chloride 0.9 % bolus 500 mL (not administered)  fentaNYL (SUBLIMAZE) injection 50 mcg (not administered)   Initial Impression / Assessment and Plan / ED Course  I have reviewed the triage vital signs and the nursing notes.  Pertinent labs & imaging results that were available during my care of the patient were reviewed by me and considered in my medical decision making (see chart for details).  Clinical Course    Patients labs show her to be volume depleted likely from lasix has she has not been having diarrhea or vomiting. She continues to have right upper/mid chest pain that she describes as burning like GERD sensation.  Will replace fluids and potassium. Will give pain  medication and repeat Troponin.  Patient admitted to Dr. Maryfrances Bunnellanford with [facility]. He has declined temp admit orders.  Final Clinical Impressions(s) / ED Diagnoses   Final diagnoses:  Chest pain, unspecified type  Dehydration  Weakness  Hypokalemia    New Prescriptions New Prescriptions   No medications on file     Marlon Peliffany Asuzena Weis, PA-C 11/08/15 0246    Gilda Creasehristopher J Pollina, MD 11/08/15 [phone]

## 2015-11-08 NOTE — ED Notes (Signed)
Report attempted by Arlys JohnBrian, RN.

## 2015-11-09 DIAGNOSIS — N189 Chronic kidney disease, unspecified: Secondary | ICD-10-CM

## 2015-11-09 DIAGNOSIS — J438 Other emphysema: Secondary | ICD-10-CM

## 2015-11-09 DIAGNOSIS — N179 Acute kidney failure, unspecified: Secondary | ICD-10-CM

## 2015-11-09 LAB — BASIC METABOLIC PANEL
Anion gap: 16 — ABNORMAL HIGH (ref 5–15)
BUN: 107 mg/dL — AB (ref 6–20)
CO2: 28 mmol/L (ref 22–32)
Calcium: 10.5 mg/dL — ABNORMAL HIGH (ref 8.9–10.3)
Chloride: 90 mmol/L — ABNORMAL LOW (ref 101–111)
Creatinine, Ser: 4.43 mg/dL — ABNORMAL HIGH (ref 0.44–1.00)
GFR calc Af Amer: 10 mL/min — ABNORMAL LOW (ref >60)
GFR, EST NON AFRICAN AMERICAN: 9 mL/min — AB (ref >60)
GLUCOSE: 104 mg/dL — AB (ref 65–99)
POTASSIUM: 3.3 mmol/L — AB (ref 3.5–5.1)
Sodium: 134 mmol/L — ABNORMAL LOW (ref 135–145)

## 2015-11-09 LAB — PROTIME-INR
INR: 1.94
Prothrombin Time: 22.4 seconds — ABNORMAL HIGH (ref 11.4–15.2)

## 2015-11-09 LAB — MAGNESIUM: MAGNESIUM: 2.3 mg/dL (ref 1.7–2.4)

## 2015-11-09 MED ORDER — ORAL CARE MOUTH RINSE
15.0000 mL | Freq: Two times a day (BID) | OROMUCOSAL | Status: DC
  Administered 2015-11-10 – 2015-11-13 (×6): 15 mL via OROMUCOSAL

## 2015-11-09 MED ORDER — POTASSIUM CHLORIDE CRYS ER 20 MEQ PO TBCR
20.0000 meq | EXTENDED_RELEASE_TABLET | Freq: Once | ORAL | Status: AC
  Administered 2015-11-09: 20 meq via ORAL
  Filled 2015-11-09: qty 1

## 2015-11-09 MED ORDER — WARFARIN SODIUM 3 MG PO TABS
3.0000 mg | ORAL_TABLET | Freq: Once | ORAL | Status: AC
  Administered 2015-11-09: 3 mg via ORAL
  Filled 2015-11-09: qty 1

## 2015-11-09 MED ORDER — HYDROCODONE-ACETAMINOPHEN 5-325 MG PO TABS
1.0000 | ORAL_TABLET | ORAL | Status: DC | PRN
  Administered 2015-11-09 – 2015-11-13 (×13): 1 via ORAL
  Filled 2015-11-09 (×13): qty 1

## 2015-11-09 NOTE — Progress Notes (Signed)
ANTICOAGULATION CONSULT NOTE   Pharmacy Consult for warfarin  Indication: atrial fibrillation, hx DVT  No Known Allergies  Vital Signs: Temp: 98.4 F (36.9 C) (10/29 0657) Temp Source: Oral (10/29 0657) BP: 118/68 (10/29 0657) Pulse Rate: 80 (10/29 0657)  Labs:  Recent Labs  11/07/15 2343 11/08/15 1348 11/09/15 0315 11/09/15 0713  HGB 9.5*  --   --   --   HCT 29.2*  --   --   --   PLT 234  --   --   --   LABPROT 19.5*  --   --  22.4*  INR 1.63  --   --  1.94  CREATININE 4.94* 4.70* 4.43*  --     Estimated Creatinine Clearance: 7.4 mL/min (by C-G formula based on SCr of 4.43 mg/dL (H)).  Assessment: [name] on warfarin PTA for afib and hx DVT. INR remains sub-therapeutic but has increased to 1.94. No bleeding noted.  Goal of Therapy:  INR 2-3 Monitor platelets by anticoagulation protocol: Yes   Plan:  -Warfarin 3 mg PO x 1 tonight -Daily PT/INR -Monitor for bleeding  Shunda Rabadi, Drake Leachachel Lynn 11/09/2015,10:38 AM

## 2015-11-09 NOTE — Progress Notes (Signed)
PROGRESS NOTE  [name] BowenLillie B Bomberger ZOX:[id number] DOB: 1933-04-10 DOA: 11/08/2015 PCP: Laurena SlimmerLARK,[name], MD   Brief History:  82 year old female with a history of systolic and diastolic CHF, COPD on 3 L nasal cannula, CKD stage IV, DVT on warfarin, atrial fibrillation, hyperthyroidism on methimazole presented with one-week history of malaise, decrease urine output, nausea, and anorexia. The patient saw her cardiologist, Dr. Royann Shiversroitoru, on 10/17/2015. At that time, the patient was instructed to continue furosemide 60 mg TID and her metolazone was increased to three times per week which was an increase from once per week.  The patient was instructed to continue the above-mentioned furosemide dose until she reached a target weight of 105 lbs. According to the patient's son, the patient reached this target weight on approximately 10/21/2015 which was the day that she switched furosemide 80 mg twice a day. She continued on metolazone 2.5 mg on Monday, Wednesday, Friday.  The patient's office weight on 10/17/2015 was 106 pounds. Even as the patient began having malaise, nausea, and decreased urine output over the past 7 days prior to admission, the patient continued to take the diuretic regimen as discussed.  Patient denies fevers, chills, headache, chest pain,  vomiting, diarrhea, abdominal pain, dysuria, hematuria, hematochezia, and melena.  The patient's son states that her LE edema is the best that it has ever been.  Upon presentation, the patient was started on potassium 2.8 with serum creatinine 4.94. WBC was 5.5 with hemoglobin 9.5. Urinalysis was negative for pyuria. The patient was given 500 mL IV fluid in ED.   Assessment/Plan: Acute on chronic renal failure--CKD stage IV -Baseline creatinine 2.8-3.1 -Presenting creatinine 4.94>>>4.43 -Hold diuretics and monitor -Repeat BMP in the morning -Renal ultrasound--neg for hydronephrosis -Urinalysis bland -hold bicarbonate  Chronic systolic  and diastolic CHF -Dry weight approximately 104-105 per records -continue daily weights--gained 1 lb since admission -appears euvolemic -hold diuretics today and repeat BMP in am -07/07/2015 echocardiogram EF 45-50%, moderate MR/TR, PASP 44  Acute metabolic Encephalopathy -secondary to acute on chronic renal failure and IV opioids -d/c dilaudid, start norco prn breakthrough pain -continue home dose of scheduled tramadol -10/29--some improvement with improving renal function -TSH 2.377 -B12 -ammonia  Atrial Fibrillation -CHADSVASc = 5 -continue warfarin -rate controlled -continue metoprolol tartrate and diltiazem  Chronic respiratory failure with hypoxia/COPD -Stable on 3 L -continue Breo  ?Hematochezia -family verbalized "blood in panties" -they do not want me to perform rectal exam -check FOBT -Hgb at baseline  Left hip pain -xray left hip--mod to severe DJD, no fracture or dislocation  HTN -BP well controlled on metoprolol and diltiazem  Hyperthyroidism -Continue methimazole  Severe protein calorie malnutrition -Continue supplements  Deconditioning -PT evaluation  Hypokalemia -replete -Mag--2.3    Disposition Plan:   Home in 1-2 days  Family Communication:   Grandsons updated at bedside--Total time spent 35 minutes.  Greater than 50% spent face to face counseling and coordinating care.     Consultants:  none  Code Status:  FULL / DNR  DVT Prophylaxis:  Coumadin   Procedures: As Listed in Progress Note Above  Antibiotics: None  Subjective: C/o left hip pain.  Patient denies fevers, chills, headache, chest pain, dyspnea, nausea, vomiting, diarrhea, abdominal pain, dysuria, hematuria, hematochezia, and melena.   Objective: Vitals:   11/09/15 0600 11/09/15 0657 11/09/15 0730 11/09/15 1112  BP:  118/68  (!) 139/102  Pulse:  80  78  Resp:  16    Temp:  98.4 F (36.9 C)    TempSrc:  Oral    SpO2:  100% 99% 100%  Weight:  46 kg (101 lb 6.4 oz)     Height:        Intake/Output Summary (Last 24 hours) at 11/09/15 1143 Last data filed at 11/09/15 0911  Gross per 24 hour  Intake              460 ml  Output             1575 ml  Net            -1115 ml   Weight change: 0.227 kg (8 oz) Exam:   General:  Pt is alert, follows commands appropriately, not in acute distress  HEENT: No icterus, No thrush, No neck mass, [place]/AT  Cardiovascular: RRR, S1/S2, no rubs, no gallops  Respiratory: CTA bilaterally, no wheezing, no crackles, no rhonchi  Abdomen: Soft/+BS, non tender, non distended, no guarding  Extremities: trace LE edema, No lymphangitis, No petechiae, No rashes, no synovitis   Data Reviewed: I have personally reviewed following labs and imaging studies Basic Metabolic Panel:  Recent Labs Lab 11/07/15 2343 11/08/15 0556 11/08/15 1348 11/09/15 0315 11/09/15 0520  NA 134*  --  135 134*  --   K 2.8*  --  3.4* 3.3*  --   CL 85*  --  90* 90*  --   CO2 34*  --  29 28  --   GLUCOSE 100*  --  148* 104*  --   BUN 118*  --  112* 107*  --   CREATININE 4.94*  --  4.70* 4.43*  --   CALCIUM 10.4*  --  10.5* 10.5*  --   MG  --  2.5*  --   --  2.3   Liver Function Tests:  Recent Labs Lab 11/08/15 0223  AST 17  ALT 9*  ALKPHOS 80  BILITOT 0.6  PROT 7.6  ALBUMIN 3.8   No results for input(s): LIPASE, AMYLASE in the last 168 hours. No results for input(s): AMMONIA in the last 168 hours. Coagulation Profile:  Recent Labs Lab 11/07/15 2343 11/09/15 0713  INR 1.63 1.94   CBC:  Recent Labs Lab 11/07/15 2343  WBC 5.5  HGB 9.5*  HCT 29.2*  MCV 101.0*  PLT 234   Cardiac Enzymes: No results for input(s): CKTOTAL, CKMB, CKMBINDEX, TROPONINI in the last 168 hours. BNP: Invalid input(s): POCBNP CBG: No results for input(s): GLUCAP in the last 168 hours. HbA1C: No results for input(s): HGBA1C in the last 72 hours. Urine analysis:    Component Value Date/Time   COLORURINE YELLOW  11/08/2015 0518   APPEARANCEUR CLEAR 11/08/2015 0518   LABSPEC 1.010 11/08/2015 0518   PHURINE 7.5 11/08/2015 0518   GLUCOSEU NEGATIVE 11/08/2015 0518   HGBUR SMALL (A) 11/08/2015 0518   BILIRUBINUR NEGATIVE 11/08/2015 0518   KETONESUR NEGATIVE 11/08/2015 0518   PROTEINUR NEGATIVE 11/08/2015 0518   UROBILINOGEN 0.2 12/12/2013 0414   NITRITE NEGATIVE 11/08/2015 0518   LEUKOCYTESUR NEGATIVE 11/08/2015 0518   Sepsis Labs: @LABRCNTIP (procalcitonin:4,lacticidven:4) )No results found for this or any previous visit (from the past 240 hour(s)).   Scheduled Meds: . allopurinol  100 mg Oral Daily  . diltiazem  120 mg Oral BID  . feeding supplement (ENSURE ENLIVE)  237 mL Oral Q24H  . fluticasone furoate-vilanterol  1 puff Inhalation Daily  . methimazole  5 mg Oral Daily  . metoprolol tartrate  12.5 mg Oral BID  .  pantoprazole  40 mg Oral Daily  . senna  1 tablet Oral Daily  . traMADol  50 mg Oral BID  . warfarin  3 mg Oral ONCE-1800  . Warfarin - Pharmacist Dosing Inpatient   Does not apply q1800   Continuous Infusions:   Procedures/Studies: Dg Chest 2 View  Result Date: 11/08/2015 CLINICAL DATA:  COPD and dyspnea. Central chest pain radiating to back for 1 week. EXAM: CHEST  2 VIEW COMPARISON:  09/29/2015 FINDINGS: There is stable cardiomegaly with atherosclerosis of the aortic arch. Clearing of right upper and right lower lobe infiltrate since prior exam. Chronic scarring and/or atelectasis at the left lung base. The lungs are slightly hyperinflated. No overt pulmonary edema, effusion or pneumothorax. Osteoarthritis of the glenohumeral joints with subchondral cystic lucencies of both humeral heads. IMPRESSION: Hyperinflated lungs with stable cardiomegaly. Aortic atherosclerosis. Osteoarthritis of both humeral heads. Electronically Signed   By: Tollie Ethavid  Kwon M.D.   On: 11/08/2015 00:41   [place] Renal  Result Date: 11/08/2015 CLINICAL DATA:  Acute on chronic renal failure history of  hypertension EXAM: RENAL / URINARY TRACT ULTRASOUND COMPLETE COMPARISON:  CT 12/12/2013, ultrasound 07/12/2008 FINDINGS: Right Kidney: Length: 7.8 cm. The right kidney demonstrates increased cortical echogenicity. There is thinning of the renal cortex. Left Kidney: Length: 6.2 cm. Left kidney poorly visualized secondary to bowel gas. No gross hydronephrosis. Bladder: The bladder is grossly unremarkable. There is heterogeneous echotexture of the liver. IMPRESSION: 1. Poor visualization of left kidney due to bowel gas. No hydronephrosis. 2. Small renal size measurements suggest renal atrophy. Increased cortical echogenicity of the right kidney compatible with medical renal disease. 3. Heterogeneous hepatic echotexture. Electronically Signed   By: Jasmine PangKim  Fujinaga M.D.   On: 11/08/2015 19:36   Dg Hip Unilat With Pelvis 2-3 Views Left  Result Date: 11/08/2015 CLINICAL DATA:  Left hip pain EXAM: DG HIP (WITH OR WITHOUT PELVIS) 2-3V LEFT COMPARISON:  07/24/2015 FINDINGS: Moderate degenerative changes of the right hip but without fracture or dislocation. Abnormal left acetabular morphology. Superior migration of left femoral head. The left femoral head is abnormal in appearance with irregular areas of sclerosis lucency and flattening, findings could be secondary to avascular necrosis. There appears to be slight increase collapse of the femoral head. There are vascular calcifications. IMPRESSION: 1. Moderate severe degenerative changes of the left hip with abnormal morphology/remodeling of the left acetabulum. There is flattening, sclerosis and lucency in the left femoral head suggestive of AVN or possible old hip dysplasia. There is superior migration of the left femoral head. There appears to be slight further loss of height of the left femoral head since the prior study. 2. Moderate arthritic changes of the right hip without acute abnormality. 3. Atherosclerotic vascular calcification. Electronically Signed   By: Jasmine PangKim   Fujinaga M.D.   On: 11/08/2015 19:33    Ridley Schewe, DO  [facility] Pager [phone]  If 7PM-7AM, please contact night-coverage [email or web address] Password [email or web address] 11/09/2015, 11:43 AM   LOS: 1 day

## 2015-11-09 NOTE — Plan of Care (Signed)
Problem: Pain Managment: Goal: General experience of comfort will improve Outcome: Progressing Has chronic pain in hips and legs; receiving home regimen

## 2015-11-10 ENCOUNTER — Encounter (HOSPITAL_COMMUNITY): Payer: Self-pay | Admitting: Physician Assistant

## 2015-11-10 DIAGNOSIS — I5042 Chronic combined systolic (congestive) and diastolic (congestive) heart failure: Secondary | ICD-10-CM

## 2015-11-10 DIAGNOSIS — N184 Chronic kidney disease, stage 4 (severe): Secondary | ICD-10-CM

## 2015-11-10 DIAGNOSIS — N170 Acute kidney failure with tubular necrosis: Secondary | ICD-10-CM

## 2015-11-10 LAB — BASIC METABOLIC PANEL
ANION GAP: 14 (ref 5–15)
BUN: 110 mg/dL — ABNORMAL HIGH (ref 6–20)
CO2: 31 mmol/L (ref 22–32)
Calcium: 10.3 mg/dL (ref 8.9–10.3)
Chloride: 91 mmol/L — ABNORMAL LOW (ref 101–111)
Creatinine, Ser: 4.54 mg/dL — ABNORMAL HIGH (ref 0.44–1.00)
GFR calc Af Amer: 10 mL/min — ABNORMAL LOW (ref >60)
GFR, EST NON AFRICAN AMERICAN: 8 mL/min — AB (ref >60)
Glucose, Bld: 104 mg/dL — ABNORMAL HIGH (ref 65–99)
POTASSIUM: 3.7 mmol/L (ref 3.5–5.1)
SODIUM: 136 mmol/L (ref 135–145)

## 2015-11-10 LAB — PROTIME-INR
INR: 2.02
Prothrombin Time: 23.2 seconds — ABNORMAL HIGH (ref 11.4–15.2)

## 2015-11-10 LAB — BRAIN NATRIURETIC PEPTIDE: B NATRIURETIC PEPTIDE 5: 447.8 pg/mL — AB (ref 0.0–100.0)

## 2015-11-10 MED ORDER — WARFARIN SODIUM 3 MG PO TABS
3.0000 mg | ORAL_TABLET | Freq: Once | ORAL | Status: AC
  Administered 2015-11-10: 3 mg via ORAL
  Filled 2015-11-10: qty 1

## 2015-11-10 MED ORDER — SODIUM CHLORIDE 0.9 % IV BOLUS (SEPSIS)
250.0000 mL | Freq: Once | INTRAVENOUS | Status: AC
  Administered 2015-11-10: 250 mL via INTRAVENOUS

## 2015-11-10 NOTE — Progress Notes (Signed)
[facility]  Patient Status: Active (receiving services up to time of hospitalization)  [facility] is providing the following services: RN and PT  If patient discharges after hours, please call [phone].   [name] FurnishDonna Fellmy 11/10/2015, 11:27 AM

## 2015-11-10 NOTE — Progress Notes (Signed)
Patient alarmed 12 beats v-tach.  Unsure if artifact or true v-tach as prior strip was artifact.  Pt. Asymptomatic and laying in bed.  Dr. Arbutus Leasat paged. Will continue to monitor.

## 2015-11-10 NOTE — Progress Notes (Signed)
PT Cancellation Note  Patient Details Name: [name] Alexander MRN: [id number] DOB: 08-01-1933   Cancelled Treatment:    Reason Eval/Treat Not Completed: Patient declined, no reason specified, pt declined working with therapy stating that now she had just gotten comfortable in chair. Will check back tomorrow.    [name], [place] 11/10/2015, 2:06 PM

## 2015-11-10 NOTE — Consult Note (Signed)
CARDIOLOGY CONSULT NOTE   Patient ID: [name] Alexander MRN: [id number] DOB/AGE: 04/20/1933 82 y.o.  Admit date: 11/08/2015  Primary Physician   Laurena SlimmerLARK,[name], MD Primary Cardiologist   Dr Royann Shiversroitoru Reason for Consultation   CHF management Requesting MD: Dr Tat  VOZ:[id number]:[name] Alexander is a 82 y.o. year old female with a history of persistent atrial fibrillation, moderate mitral insufficiency, mildly depressed left ventricular systolic function (45%), combined systolic and diastolic heart failure, suspected CAD (coronary calcifications on CT, inferior wall motion abnormality), severe COPD on chronic home O2 (3 L/min), stage IV chronic kidney disease, history of bilateral lower extremity DVT on chronic warfarin anticoagulation, malnutrition/underweight, CAP w/ d/c 09/21/2015.  Seen by Dr Royann Shiversroitoru 10/06 and dry weight felt to be 105 lbs, metolazone increased from qweek to 3 x week w/ Lasix continued at 60 mg tid. Plan to change Lasix to 80 mg bid once dry weight reached. Metoprolol dose decreased from 25 mg bid >>12.5 mg bid.  Dry weight reached and Lasix changed to 80 mg bid on 10/10, but pt kept on taking metolazone 3 x wk. Low BP problems so Zaroxolyn cut back to 2 x week.  Pt developed general malaise, nausea and decreased urine output. Came to ER 10/27 pm and K+ 2.8 w/ BUN/CR 118/4.94. Previous values 43/2.97. Diuretics held and Diltiazem held also.   Since admission, electrolytes supplemented and improved. BUN/Cr 110/4.54 today. She was not hydrated, but po intake not high and pt had negative I/O just after admission.    Cards asked to assess pt and determine CHF rx going forward.  [name] feels much better than when she was admitted. Her SOB is at baseline and her legs have no fluid (very unusual). She has MS issues with her hips and does not drink much because she hurts when she gets up and moves to the bathroom. No wheezing, denies PND. No chest pain, no palpitations. No fevers or  chills.     Past Medical History:  Diagnosis Date  . Arthritis    "hips, knees, shoulders" (07/31/2015)  . Chronic diastolic CHF (congestive heart failure), NYHA class 2 (HCC)   . CKD (chronic kidney disease), stage IV (HCC)   . COPD (chronic obstructive pulmonary disease) (HCC)   . DVT (deep venous thrombosis) (HCC) "2-3 times"  . GERD (gastroesophageal reflux disease)   . Gout   . Hypertension   . Hyperthyroidism   . On home oxygen therapy    "2L; 24/7" (07/31/2015)  . Pneumonia   . Thyroid disease      Past Surgical History:  Procedure Laterality Date  . CATARACT EXTRACTION W/ INTRAOCULAR LENS  IMPLANT, BILATERAL Bilateral   . JOINT REPLACEMENT    . TOTAL KNEE ARTHROPLASTY Right 06/2004   [name]/notes 05/26/2010  . VAGINAL HYSTERECTOMY      No Known Allergies  I have reviewed the patient's current medications . allopurinol  100 mg Oral Daily  . feeding supplement (ENSURE ENLIVE)  237 mL Oral Q24H  . fluticasone furoate-vilanterol  1 puff Inhalation Daily  . mouth rinse  15 mL Mouth Rinse BID  . methimazole  5 mg Oral Daily  . metoprolol tartrate  12.5 mg Oral BID  . pantoprazole  40 mg Oral Daily  . senna  1 tablet Oral Daily  . traMADol  50 mg Oral BID  . warfarin  3 mg Oral ONCE-1800  . Warfarin - Pharmacist Dosing Inpatient   Does not apply [id number]  acetaminophen **OR** acetaminophen, albuterol, HYDROcodone-acetaminophen, ondansetron **OR** ondansetron (ZOFRAN) IV  Medication Sig  acetaminophen (TYLENOL) 650 MG CR tablet Take 1,300 mg by mouth 3 (three) times daily as needed for pain.   allopurinol (ZYLOPRIM) 100 MG tablet Take 1 tablet (100 mg total) by mouth every morning. Patient taking differently: Take 100 mg by mouth daily.   diltiazem (CARDIZEM SR) 120 MG 12 hr capsule Take 1 capsule (120 mg total) by mouth 2 (two) times daily.  feeding supplement, ENSURE COMPLETE, (ENSURE COMPLETE) LIQD Take 237 mLs by mouth 2 (two) times daily between meals.  furosemide  (LASIX) 20 MG tablet Take 4 tablets (80 mg total) by mouth 2 (two) times daily.  methimazole (TAPAZOLE) 5 MG tablet Take 5 mg by mouth daily.   metolazone (ZAROXOLYN) 2.5 MG tablet Take 1 tablet (2.5 mg total) by mouth 3 (three) times a week. Mondays, Wednesdays, and Fridays Patient taking differently: Take 2.5 mg by mouth every Monday, Wednesday, and Friday.   metoprolol tartrate (LOPRESSOR) 25 MG tablet Take 0.5 tablets (12.5 mg total) by mouth 2 (two) times daily.  OXYGEN Inhale 2 L into the lungs continuous.  pantoprazole (PROTONIX) 40 MG tablet Take 1 tablet (40 mg total) by mouth daily.  senna (SENOKOT) 8.6 MG TABS tablet Take 1 tablet (8.6 mg total) by mouth daily.  sodium bicarbonate 650 MG tablet Take 1 tablet (650 mg total) by mouth 3 (three) times daily.  traMADol (ULTRAM) 50 MG tablet Take 50 mg by mouth 2 (two) times daily.   warfarin (COUMADIN) 1 MG tablet Take 3 mg by mouth daily at 6 PM.   albuterol (PROVENTIL HFA;VENTOLIN HFA) 108 (90 Base) MCG/ACT inhaler Inhale 2 puffs into the lungs every 6 (six) hours as needed for wheezing or shortness of breath. Patient not taking: Reported on 11/08/2015     Social History   Social History  . Marital status: Widowed    Spouse name: N/A  . Number of children: N/A  . Years of education: N/A   Occupational History  . Retired    Social History Main Topics  . Smoking status: Former Smoker    Packs/day: 2.00    Years: 50.00    Types: Cigarettes    Quit date: 03/12/2015  . Smokeless tobacco: Never Used  . Alcohol use No  . Drug use: No  . Sexual activity: Yes   Other Topics Concern  . Not on file   Social History Narrative   Good family support.    Family Status  Relation Status  . Maternal Grandmother Deceased  . Maternal Grandfather Deceased  . Paternal Grandmother Deceased  . Paternal Grandfather Deceased  . Mother Deceased  . Father Deceased  . Neg Hx    Family History  Problem Relation Age of Onset  . Stroke  Neg Hx   . CAD Neg Hx      ROS:  Full 14 point review of systems complete and found to be negative unless listed above.  Physical Exam: Blood pressure 122/73, pulse 76, temperature 97.8 F (36.6 C), temperature source Oral, resp. rate 18, height 5\' 4"  (1.626 m), weight 100 lb 6.4 oz (45.5 kg), SpO2 100 %.  General: Well developed, well nourished, female in no acute distress Head: Eyes PERRLA, No xanthomas.   Normocephalic and atraumatic, oropharynx without edema or exudate. Dentition: poor Lungs: bibasilar rales, not dense, good respiratory effort Heart: Heart irregular rate and rhythm with S1, S2, 2/6 murmur. pulses are 2+ upper extrem.  Decreased  but palpable in lower extrem Neck: No carotid bruits. No lymphadenopathy.  JVD not elevated Abdomen: Bowel sounds present, abdomen soft and non-tender without masses or hernias noted. Msk:  No spine or cva tenderness. No weakness, no joint deformities or effusions. Extremities: No clubbing or cyanosis. No edema.  Neuro: Alert and oriented X 3. No focal deficits noted. Psych:  Good affect, responds appropriately Skin: No rashes or lesions noted.  Labs:   Lab Results  Component Value Date   WBC 5.5 11/07/2015   HGB 9.5 (L) 11/07/2015   HCT 29.2 (L) 11/07/2015   MCV 101.0 (H) 11/07/2015   PLT 234 11/07/2015    Recent Labs  11/10/15 0328  INR 2.02    Recent Labs Lab 11/08/15 0223  11/10/15 0328  NA  --   < > 136  K  --   < > 3.7  CL  --   < > 91*  CO2  --   < > 31  BUN  --   < > 110*  CREATININE  --   < > 4.54*  CALCIUM  --   < > 10.3  PROT 7.6  --   --   BILITOT 0.6  --   --   ALKPHOS 80  --   --   ALT 9*  --   --   AST 17  --   --   GLUCOSE  --   < > 104*  ALBUMIN 3.8  --   --   < > = values in this interval not displayed. Magnesium  Date Value Ref Range Status  11/09/2015 2.3 1.7 - 2.4 mg/dL Final    Recent Labs  40/98/1110/28/17 0005  TROPIPOC 0.05   TSH  Date/Time Value Ref Range Status  11/08/2015 05:56 AM  2.377 0.350 - 4.500 uIU/mL Final    Comment:    Performed by a 3rd Generation assay with a functional sensitivity of <=0.01 uIU/mL.  12/12/2013 07:46 AM 2.320 0.350 - 4.500 uIU/mL Final   Echo: 06/2015 - Left ventricle: The cavity size was normal. There was mild   concentric hypertrophy. Systolic function was mildly reduced. The   estimated ejection fraction was in the range of 45% to 50%.   Diffuse hypokinesis. Doppler parameters are consistent with   restrictive physiology, indicative of decreased left ventricular   diastolic compliance and/or increased left atrial pressure.   Doppler parameters are consistent with elevated ventricular   end-diastolic filling pressure. - Aortic valve: There was trivial regurgitation. - Mitral valve: There was moderate regurgitation directed   posteriorly. - Left atrium: The atrium was moderately dilated. - Right ventricle: Systolic function was mildly to moderately   reduced. - Right atrium: The atrium was moderately dilated. - Tricuspid valve: There was moderate regurgitation. - Pulmonary arteries: Systolic pressure was moderately increased.   PA peak pressure: 44 mm Hg (S). Impressions: - There is a change when compared to the prior study from   12/12/2013, LVEF has mildly decreased, now 45% with diffuse   hypokinesis. LVEF is most probably overestimated by significant MR.   Mitral valve has rheumatic appearance of the anterior leaflet   with posteriorly directed jet of at least moderate and possibly   severe mitral regurgitation.   There is moderate pulmonary hypertension.   RVEF is now mildly to moderately decreased.    Conclusively, this appears as worsening valvular cardiomyopathy.  ECG:  10/27 Atrial fib, HR 68, LBBB is "new" no morphology change but QRS complex 116  81191 ECG, now 132 ms, PVCs  Cath: n/a  Radiology:  US Renal  Result Date: 11/08/2015 CLINICAL DATA:  Acute on chronic renal failure history of hypertension EXAM:  RENAL / URINARY TRACT ULTRASOUND COMPLETE COMPARISON:  CT 12/12/2013, ultrasound 07/12/2008 FINDINGS: Right Kidney: Length: 7.8 cm. The right kidney demonstrates increased cortical echogenicity. There is thinning of the renal cortex. Left Kidney: Length: 6.2 cm. Left kidney poorly visualized secondary to bowel gas. No gross hydronephrosis. Bladder: The bladder is grossly unremarkable. There is heterogeneous echotexture of the liver. IMPRESSION: 1. Poor visualization of left kidney due to bowel gas. No hydronephrosis. 2. Small renal size measurements suggest renal atrophy. Increased cortical echogenicity of the right kidney compatible with medical renal disease. 3. Heterogeneous hepatic echotexture. Electronically Signed   By: Jasmine Pang M.D.   On: 11/08/2015 19:36   Dg Hip Unilat With Pelvis 2-3 Views Left  Result Date: 11/08/2015 CLINICAL DATA:  Left hip pain EXAM: DG HIP (WITH OR WITHOUT PELVIS) 2-3V LEFT COMPARISON:  07/24/2015 FINDINGS: Moderate degenerative changes of the right hip but without fracture or dislocation. Abnormal left acetabular morphology. Superior migration of left femoral head. The left femoral head is abnormal in appearance with irregular areas of sclerosis lucency and flattening, findings could be secondary to avascular necrosis. There appears to be slight increase collapse of the femoral head. There are vascular calcifications. IMPRESSION: 1. Moderate severe degenerative changes of the left hip with abnormal morphology/remodeling of the left acetabulum. There is flattening, sclerosis and lucency in the left femoral head suggestive of AVN or possible old hip dysplasia. There is superior migration of the left femoral head. There appears to be slight further loss of height of the left femoral head since the prior study. 2. Moderate arthritic changes of the right hip without acute abnormality. 3. Atherosclerotic vascular calcification. Electronically Signed   By: Jasmine Pang M.D.   On:  11/08/2015 19:33    ASSESSMENT AND PLAN:   The patient was seen today by Dr Duke Salvia, the patient evaluated and the data reviewed.  Principal Problem: 1.   Acute renal failure (ARF) (HCC) - slow improvement  2.   Chronic combined systolic and diastolic CHF (congestive heart failure) (HCC) - pt is at/below dry weight - currently off diuretics due to renal function - now that pt weight is at baseline, MD advise if we wait till BUN/Cr improved before resuming diuretic therapy and follow weights in the meantime.  Otherwise, per IM Active Problems:   Hypertension   Hyperthyroidism   Normocytic anemia   CKD (chronic kidney disease), stage IV (HCC)   Protein-calorie malnutrition, severe   COPD (chronic obstructive pulmonary disease) (HCC)   Permanent atrial fibrillation (HCC)   Acute renal failure superimposed on stage 4 chronic kidney disease (HCC)   Chronic respiratory failure with hypoxia (HCC)   Hypokalemia   Left hip pain   Chest pain   Acute on chronic renal failure (HCC)   SignedTheodore Demark, PA-C 11/10/2015 3:03 PM Beeper [phone]  Co-Sign MD

## 2015-11-10 NOTE — Progress Notes (Signed)
ANTICOAGULATION CONSULT NOTE   Pharmacy Consult for warfarin  Indication: atrial fibrillation, hx DVT  No Known Allergies  Vital Signs: Temp: 97.7 F (36.5 C) (10/30 0308) Temp Source: Oral (10/30 0308) BP: 107/52 (10/30 0308) Pulse Rate: 70 (10/30 0308)  Labs:  Recent Labs  11/07/15 2343 11/08/15 1348 11/09/15 0315 11/09/15 0713 11/10/15 0328  HGB 9.5*  --   --   --   --   HCT 29.2*  --   --   --   --   PLT 234  --   --   --   --   LABPROT 19.5*  --   --  22.4* 23.2*  INR 1.63  --   --  1.94 2.02  CREATININE 4.94* 4.70* 4.43*  --  4.54*    Estimated Creatinine Clearance: 7.1 mL/min (by C-G formula based on SCr of 4.54 mg/dL (H)).  Assessment: [name] on Coumadin 3mg  daily for hx afib. INR low on admission. Now up to 2.02. Hgb low at 9.5, plts wnl  Goal of Therapy:  INR 2-3 Monitor platelets by anticoagulation protocol: Yes   Plan:  Continue Coumadin 3mg  PO x 1 tonight Monitor daily INR, CBC, s/s of bleed  Enzo BiNathan Marieliz Strang, PharmD, [facility] LLCBCPS Clinical Pharmacist Pager [phone] 11/10/2015 10:28 AM

## 2015-11-10 NOTE — Progress Notes (Signed)
Noted in Dr. Leonides Sakeandolph's progress note that she wanted pt. To get 250ml bolus however no order placed.  Samara DeistKathryn, [place]PA paged and asked to place order in Dr. Leonides Sakeandolph's name.  Will continue to monitor.

## 2015-11-10 NOTE — Progress Notes (Signed)
PROGRESS NOTE  [name] Alexander BJY:[id number] DOB: [birth year] DOA: 11/08/2015 PCP: [name], MD  Brief History: 82 year old female with a history of systolic and diastolic CHF, COPD on 3 L nasal cannula, CKD stage IV, DVT on warfarin, atrial fibrillation, hyperthyroidism on methimazole presented with one-week history of malaise, decrease urineoutput, nausea, and anorexia. The patient saw her cardiologist, Dr. Royann Shivers, on 10/17/2015. At that time, the patient was instructed to continue furosemide 60 mg TID and her metolazone was increased to three times per week which was an increase from once per week. The patient was instructed to continue the above-mentioned furosemide dose until she reached a target weight of 105 lbs. According to the patient's son, the patient reached this target weight on approximately 10/21/2015 which was the day that she switched furosemide 80 mg twice a day. She continued on metolazone 2.5 mg on Monday, Wednesday, Friday. The patient's office weight on 10/17/2015 was 106 pounds. Even as the patient began having malaise, nausea, and decreased urineoutput over the past 7 days prior to admission, the patient continued to take the diuretic regimen as discussed. Patient denies fevers, chills, headache, chest pain, vomiting, diarrhea, abdominal pain, dysuria, hematuria, hematochezia, and melena. The patient's son states that her LE edema is the best that it has ever been.  Upon presentation, the patient was started on potassium 2.8 with serum creatinine 4.94. WBC was 5.5 with hemoglobin 9.5. Urinalysis was negative for pyuria. The patient was given 500 mL IV fluid in ED.   Assessment/Plan: Acute on chronic renal failure--CKD stage IV -Baseline creatinine 2.8-3.1 -Presenting creatinine 4.94>>4.43>>4.54 -Hold diuretics and monitor -Renal ultrasound--neg for hydronephrosis -Urinalysis bland -hold bicarbonate  Chronic systolic and diastolic CHF -Dry  weight approximately 104-105 per records -continue daily weights--stable at 100 lbs since admission -appears euvolemic -consult cardiology--?when to restart diuretics and dosing -07/07/2015 echocardiogram EF 45-50%, moderate MR/TR, PASP 44  Acute metabolic Encephalopathy -secondary to acute on chronic renal failure and IV opioids -d/c dilaudid, start norco prn breakthrough pain -continue home dose of scheduled tramadol -10/30--overall close to baseline per caretaker at bedside -TSH 2.377  Atrial Fibrillation -CHADSVASc = 5 -continue warfarin -rate controlled -continuemetoprolol tartrate  -d/c diltiazem due to soft BPs  Chronic respiratory failure with hypoxia/COPD -Stable on 3 L -continue Breo  ?Hematochezia -family verbalized "blood in panties" -they do not want me to perform rectal exam -check FOBT -Hgb at baseline  Left hip pain -xray left hip--mod to severe DJD, no fracture or dislocation  HTN -BP well controlled  -hold diltiazem due to soft BP -continue metoprolol tartrate  Hyperthyroidism -Continue methimazole  Severe protein calorie malnutrition -Continue supplements  Deconditioning -PT evaluation  Hypokalemia -replete -Mag--2.3    Disposition Plan: Home when cleared by cardiology  Family Communication:home caretaker at bedside updated   Consultants: none  Code Status: FULL   DVT Prophylaxis: Coumadin   Procedures: As Listed in Progress Note Above  Antibiotics: None  Subjective: Complains of chronic left hip pain unchanged.  Patient denies fevers, chills, headache, chest pain, dyspnea, nausea, vomiting, diarrhea, abdominal pain, dysuria, hematuria, hematochezia, and melena.   Objective: Vitals:   11/09/15 1112 11/09/15 1208 11/09/15 2138 11/10/15 0308  BP: (!) 139/102 (!) 99/57 (!) 108/52 (!) 107/52  Pulse: 78 73 73 70  Resp:  16 20 19   Temp:  98.4 F (36.9 C) 97.7 F (36.5 C) 97.7 F (36.5 C)    TempSrc:  Oral Oral Oral  SpO2:  100% 100% 95% 100%  Weight:    45.5 kg (100 lb 6.4 oz)  Height:        Intake/Output Summary (Last 24 hours) at 11/10/15 0919 Last data filed at 11/10/15 0752  Gross per 24 hour  Intake              820 ml  Output              552 ml  Net              268 ml   Weight change: -0.454 kg (-1 lb) Exam:   General:  Pt is alert, follows commands appropriately, not in acute distress  HEENT: No icterus, No thrush, No neck mass, [place]/AT  Cardiovascular: RRR, S1/S2, no rubs, no gallops  Respiratory: CTA bilaterally, no wheezing, no crackles, no rhonchi  Abdomen: Soft/+BS, non tender, non distended, no guarding  Extremities: trace LE edema, No lymphangitis, No petechiae, No rashes, no synovitis   Data Reviewed: I have personally reviewed following labs and imaging studies Basic Metabolic Panel:  Recent Labs Lab 11/07/15 2343 11/08/15 0556 11/08/15 1348 11/09/15 0315 11/09/15 0520 11/10/15 0328  NA 134*  --  135 134*  --  136  K 2.8*  --  3.4* 3.3*  --  3.7  CL 85*  --  90* 90*  --  91*  CO2 34*  --  29 28  --  31  GLUCOSE 100*  --  148* 104*  --  104*  BUN 118*  --  112* 107*  --  110*  CREATININE 4.94*  --  4.70* 4.43*  --  4.54*  CALCIUM 10.4*  --  10.5* 10.5*  --  10.3  MG  --  2.5*  --   --  2.3  --    Liver Function Tests:  Recent Labs Lab 11/08/15 0223  AST 17  ALT 9*  ALKPHOS 80  BILITOT 0.6  PROT 7.6  ALBUMIN 3.8   No results for input(s): LIPASE, AMYLASE in the last 168 hours. No results for input(s): AMMONIA in the last 168 hours. Coagulation Profile:  Recent Labs Lab 11/07/15 2343 11/09/15 0713 11/10/15 0328  INR 1.63 1.94 2.02   CBC:  Recent Labs Lab 11/07/15 2343  WBC 5.5  HGB 9.5*  HCT 29.2*  MCV 101.0*  PLT 234   Cardiac Enzymes: No results for input(s): CKTOTAL, CKMB, CKMBINDEX, TROPONINI in the last 168 hours. BNP: Invalid input(s): POCBNP CBG: No results for input(s): GLUCAP in the last  168 hours. HbA1C: No results for input(s): HGBA1C in the last 72 hours. Urine analysis:    Component Value Date/Time   COLORURINE YELLOW 11/08/2015 0518   APPEARANCEUR CLEAR 11/08/2015 0518   LABSPEC 1.010 11/08/2015 0518   PHURINE 7.5 11/08/2015 0518   GLUCOSEU NEGATIVE 11/08/2015 0518   HGBUR SMALL (A) 11/08/2015 0518   BILIRUBINUR NEGATIVE 11/08/2015 0518   KETONESUR NEGATIVE 11/08/2015 0518   PROTEINUR NEGATIVE 11/08/2015 0518   UROBILINOGEN 0.2 12/12/2013 0414   NITRITE NEGATIVE 11/08/2015 0518   LEUKOCYTESUR NEGATIVE 11/08/2015 0518   Sepsis Labs: @LABRCNTIP (procalcitonin:4,lacticidven:4) )No results found for this or any previous visit (from the past 240 hour(s)).   Scheduled Meds: . allopurinol  100 mg Oral Daily  . diltiazem  120 mg Oral BID  . feeding supplement (ENSURE ENLIVE)  237 mL Oral Q24H  . fluticasone furoate-vilanterol  1 puff Inhalation Daily  . mouth rinse  15 mL Mouth Rinse BID  . methimazole  5 mg Oral Daily  . metoprolol tartrate  12.5 mg Oral BID  . pantoprazole  40 mg Oral Daily  . senna  1 tablet Oral Daily  . traMADol  50 mg Oral BID  . Warfarin - Pharmacist Dosing Inpatient   Does not apply q1800   Continuous Infusions:   Procedures/Studies: Dg Chest 2 View  Result Date: 11/08/2015 CLINICAL DATA:  COPD and dyspnea. Central chest pain radiating to back for 1 week. EXAM: CHEST  2 VIEW COMPARISON:  09/29/2015 FINDINGS: There is stable cardiomegaly with atherosclerosis of the aortic arch. Clearing of right upper and right lower lobe infiltrate since prior exam. Chronic scarring and/or atelectasis at the left lung base. The lungs are slightly hyperinflated. No overt pulmonary edema, effusion or pneumothorax. Osteoarthritis of the glenohumeral joints with subchondral cystic lucencies of both humeral heads. IMPRESSION: Hyperinflated lungs with stable cardiomegaly. Aortic atherosclerosis. Osteoarthritis of both humeral heads. Electronically Signed   By:  Tollie Ethavid  Kwon M.D.   On: 11/08/2015 00:41   [place] Renal  Result Date: 11/08/2015 CLINICAL DATA:  Acute on chronic renal failure history of hypertension EXAM: RENAL / URINARY TRACT ULTRASOUND COMPLETE COMPARISON:  CT 12/12/2013, ultrasound 07/12/2008 FINDINGS: Right Kidney: Length: 7.8 cm. The right kidney demonstrates increased cortical echogenicity. There is thinning of the renal cortex. Left Kidney: Length: 6.2 cm. Left kidney poorly visualized secondary to bowel gas. No gross hydronephrosis. Bladder: The bladder is grossly unremarkable. There is heterogeneous echotexture of the liver. IMPRESSION: 1. Poor visualization of left kidney due to bowel gas. No hydronephrosis. 2. Small renal size measurements suggest renal atrophy. Increased cortical echogenicity of the right kidney compatible with medical renal disease. 3. Heterogeneous hepatic echotexture. Electronically Signed   By: Jasmine PangKim  Fujinaga M.D.   On: 11/08/2015 19:36   Dg Hip Unilat With Pelvis 2-3 Views Left  Result Date: 11/08/2015 CLINICAL DATA:  Left hip pain EXAM: DG HIP (WITH OR WITHOUT PELVIS) 2-3V LEFT COMPARISON:  07/24/2015 FINDINGS: Moderate degenerative changes of the right hip but without fracture or dislocation. Abnormal left acetabular morphology. Superior migration of left femoral head. The left femoral head is abnormal in appearance with irregular areas of sclerosis lucency and flattening, findings could be secondary to avascular necrosis. There appears to be slight increase collapse of the femoral head. There are vascular calcifications. IMPRESSION: 1. Moderate severe degenerative changes of the left hip with abnormal morphology/remodeling of the left acetabulum. There is flattening, sclerosis and lucency in the left femoral head suggestive of AVN or possible old hip dysplasia. There is superior migration of the left femoral head. There appears to be slight further loss of height of the left femoral head since the prior study. 2. Moderate  arthritic changes of the right hip without acute abnormality. 3. Atherosclerotic vascular calcification. Electronically Signed   By: Jasmine PangKim  Fujinaga M.D.   On: 11/08/2015 19:33    [name] Heims, DO  [facility] Pager [phone]  If 7PM-7AM, please contact night-coverage [email or web address] Password [email or web address] 11/10/2015, 9:19 AM   LOS: 2 days

## 2015-11-10 NOTE — Progress Notes (Signed)
PT Cancellation Note  Patient Details Name: [name] MRN: [id number] DOB: 11/05/1933   Cancelled Treatment:    Reason Eval/Treat Not Completed: Other (comment), pt currently sitting up in chair eating lunch and visiting with family. Requests to not work with PT at this time. Will check back later as time allows.    Chayne Baumgart, [place] 11/10/2015, 12:34 PM

## 2015-11-10 NOTE — Consult Note (Signed)
   [facility]   11/10/2015  [name] Alexander 04-04-1933 [id number]  [facility] for post hospital follow up. This is the patient's 4th admission with 2 ED visits in the past 6 months. Chart review indicates that [facility] nurse was unable to maintain contact with the patient or son.  The patient is an 82 year old female with a history of systolic and diastolic HF, COPD on 3 L nasal cannula, CKD stage IV, DVT on warfarin, atrial fibrillation, hyperthyroidism on methimazole presented with one-week history of malaise, decrease urineoutput, nausea, and anorexia. The patient saw her cardiologist, Dr. Royann Shiversroitoru, on 10/17/2015. At that time, the patient was instructed to continue furosemide 60 mg TID and her metolazone was increased to three times per week which was an increase from once per week. The patient was instructed to continue the above-mentioned furosemide dose until she reached a target weight of 105 lbs. According to the patient's son, the patient reached this target weight on approximately 10/21/2015 which was the day that she switched furosemide 80 mg twice a day. She continued on metolazone 2.5 mg on Monday, Wednesday, Friday. The patient's office weight on 10/17/2015 was 106 pounds. Even as the patient began having malaise, nausea, and decreased urineoutput over the past 7 days prior to admission, the patient continued to take the diuretic regimen as discussed. The patient's son states that her LE edema is the best that it has ever been per MD progress notes.   Spoke with the patient at the bedside.  She states she would defer discharge needs to her son [name] Alexander.  She states, "you can leave the information right here."  Patient was up in chair and brochure with contact information was left on the bedside table.  [name] ShanksVictoria Toniesha Zellner, RN BSN CCM [facility]  [phone] business  mobile phone Toll free office [phone]

## 2015-11-11 LAB — PROTIME-INR
INR: 2.09
PROTHROMBIN TIME: 23.8 s — AB (ref 11.4–15.2)

## 2015-11-11 LAB — BASIC METABOLIC PANEL
Anion gap: 12 (ref 5–15)
BUN: 102 mg/dL — AB (ref 6–20)
CALCIUM: 10.3 mg/dL (ref 8.9–10.3)
CHLORIDE: 92 mmol/L — AB (ref 101–111)
CO2: 30 mmol/L (ref 22–32)
CREATININE: 4.19 mg/dL — AB (ref 0.44–1.00)
GFR calc non Af Amer: 9 mL/min — ABNORMAL LOW (ref >60)
GFR, EST AFRICAN AMERICAN: 11 mL/min — AB (ref >60)
GLUCOSE: 103 mg/dL — AB (ref 65–99)
Potassium: 3.4 mmol/L — ABNORMAL LOW (ref 3.5–5.1)
Sodium: 134 mmol/L — ABNORMAL LOW (ref 135–145)

## 2015-11-11 MED ORDER — WARFARIN SODIUM 3 MG PO TABS
3.0000 mg | ORAL_TABLET | Freq: Once | ORAL | Status: AC
  Administered 2015-11-11: 3 mg via ORAL
  Filled 2015-11-11: qty 1

## 2015-11-11 MED ORDER — POTASSIUM CHLORIDE CRYS ER 20 MEQ PO TBCR
20.0000 meq | EXTENDED_RELEASE_TABLET | Freq: Once | ORAL | Status: AC
  Administered 2015-11-11: 20 meq via ORAL
  Filled 2015-11-11: qty 1

## 2015-11-11 NOTE — Progress Notes (Signed)
ANTICOAGULATION CONSULT NOTE   Pharmacy Consult for warfarin  Indication: atrial fibrillation, hx DVT  No Known Allergies  Vital Signs: Temp: 98.7 F (37.1 C) (10/31 0546) Temp Source: Oral (10/31 0546) BP: 115/68 (10/31 0546) Pulse Rate: 94 (10/31 0546)  Labs:  Recent Labs  11/09/15 0315 11/09/15 0713 11/10/15 0328 11/11/15 0353  LABPROT  --  22.4* 23.2* 23.8*  INR  --  1.94 2.02 2.09  CREATININE 4.43*  --  4.54* 4.19*    Estimated Creatinine Clearance: 7.7 mL/min (by C-G formula based on SCr of 4.19 mg/dL (H)).  Assessment: [name] on Coumadin 3mg  daily for hx afib. INR low on admission. Now up to 2.09. Hgb low at 9.5, plts wnl. No s/s of bleed.  Goal of Therapy:  INR 2-3 Monitor platelets by anticoagulation protocol: Yes   Plan:  Continue Coumadin 3mg  PO x 1 tonight Monitor daily INR, CBC, s/s of bleed  Enzo BiNathan Tyden Kann, PharmD, BCPS Clinical Pharmacist Pager [phone] 11/11/2015 8:08 AM

## 2015-11-11 NOTE — Progress Notes (Signed)
PROGRESS NOTE  [name] Alexander:[id number] DOB: [birth year] DOA: 11/08/2015 PCP: [name], MD  Brief History: 82 year old female with a history of systolic and diastolic CHF, COPD on 3 L nasal cannula, CKD stage IV, DVT on warfarin, atrial fibrillation, hyperthyroidism on methimazole presented with one-week history of malaise, decrease urineoutput, nausea, and anorexia. The patient saw her cardiologist, Dr. Royann Shivers, on 10/17/2015. At that time, the patient was instructed to continue furosemide 60 mg TID and her metolazone was increased to three times per week which was an increase from once per week. The patient was instructed to continue the above-mentioned furosemide dose until she reached a target weight of 105 lbs. According to the patient's son, the patient reached this target weight on approximately 10/21/2015 which was the day that she switched furosemide 80 mg twice a day. She continued on metolazone 2.5 mg on Monday, Wednesday, Friday. The patient's office weight on 10/17/2015 was 106 pounds. Even as the patient began having malaise, nausea, and decreased urineoutput over the past 7 days prior to admission, the patient continued to take the diuretic regimen as discussed. Patient denies fevers, chills, headache, chest pain, vomiting, diarrhea, abdominal pain, dysuria, hematuria, hematochezia, and melena. The patient's son states that her LE edema is the best that it has ever been.  Upon presentation, the patient was started on potassium 2.8 with serum creatinine 4.94. WBC was 5.5 with hemoglobin 9.5. Urinalysis was negative for pyuria. The patient was given 500 mL IV fluid in ED.   Assessment/Plan: Acute on chronic renal failure--CKD stage IV -Baseline creatinine 2.8-3.1 -11/10/15--250 cc fluid IV per cardiology -Presenting creatinine 4.94>>4.43>>4.54>>4.19 -Hold diuretics and monitor -Renal ultrasound--neg for hydronephrosis -Urinalysis bland -hold  bicarbonate  Chronic systolic and diastolic CHF -Dry weight approximately 104-105 per records -continue daily weights--stable at 100 lbs since admission -appears euvolemic -appreciate cardiology--defer diuretic regimen to cards -07/07/2015 echocardiogram EF 45-50%, moderate MR/TR, PASP 44  Acute metabolic Encephalopathy -secondary to acute on chronic renal failure and IV opioids -d/c dilaudid, start norco prn breakthrough pain -continue home dose of scheduled tramadol -10/31--resolved -TSH 2.377  Atrial Fibrillation -CHADSVASc = 5 -continue warfarin -rate controlled -continuemetoprolol tartrate  -d/c diltiazem due to soft BPs  Chronic respiratory failure with hypoxia/COPD -Stable on 3 L -continue Breo  ?Hematochezia -family verbalized "blood in panties" -they do not want me to perform rectal exam -check FOBT -Hgb at baseline  Left hip pain -xray left hip--mod to severe DJD, no fracture or dislocation  HTN -BP well controlled  -hold diltiazem due to soft BP -continue metoprolol tartrate  Hyperthyroidism -Continue methimazole  Severe protein calorie malnutrition -Continue supplements  Deconditioning -PT evaluation  Hypokalemia -replete -Mag--2.3    Disposition Plan: Home when cleared by cardiology--likely 11/12/15  Family Communication:home caretaker at bedside updated   Consultants: none  Code Status: FULL   DVT Prophylaxis: Coumadin   Procedures: As Listed in Progress Note Above  Antibiotics: None    Subjective: Patient denies fevers, chills, headache, chest pain, dyspnea, nausea, vomiting, diarrhea, abdominal pain, dysuria, hematuria, hematochezia, and melena.   Objective: Vitals:   11/10/15 1216 11/10/15 2026 11/11/15 0546 11/11/15 1500  BP: 122/73 114/63 115/68 112/69  Pulse: 76 78 94 98  Resp: 18 18 18 18   Temp: 97.8 F (36.6 C) 98.6 F (37 C) 98.7 F (37.1 C) 98 F (36.7 C)  TempSrc: Oral Oral  Oral Oral  SpO2: 100% 100% 100% 100%  Weight:   45.6  kg (100 lb 9.6 oz)   Height:        Intake/Output Summary (Last 24 hours) at 11/11/15 1743 Last data filed at 11/11/15 1642  Gross per 24 hour  Intake              360 ml  Output             1490 ml  Net            -1130 ml   Weight change: 0.091 kg (3.2 oz) Exam:   General:  Pt is alert, follows commands appropriately, not in acute distress  HEENT: No icterus, No thrush, No neck mass, [place]/AT  Cardiovascular: RRR, S1/S2, no rubs, no gallops  Respiratory: CTA bilaterally, no wheezing, no crackles, no rhonchi  Abdomen: Soft/+BS, non tender, non distended, no guarding  Extremities: No edema, No lymphangitis, No petechiae, No rashes, no synovitis   Data Reviewed: I have personally reviewed following labs and imaging studies Basic Metabolic Panel:  Recent Labs Lab 11/07/15 2343 11/08/15 0556 11/08/15 1348 11/09/15 0315 11/09/15 0520 11/10/15 0328 11/11/15 0353  NA 134*  --  135 134*  --  136 134*  K 2.8*  --  3.4* 3.3*  --  3.7 3.4*  CL 85*  --  90* 90*  --  91* 92*  CO2 34*  --  29 28  --  31 30  GLUCOSE 100*  --  148* 104*  --  104* 103*  BUN 118*  --  112* 107*  --  110* 102*  CREATININE 4.94*  --  4.70* 4.43*  --  4.54* 4.19*  CALCIUM 10.4*  --  10.5* 10.5*  --  10.3 10.3  MG  --  2.5*  --   --  2.3  --   --    Liver Function Tests:  Recent Labs Lab 11/08/15 0223  AST 17  ALT 9*  ALKPHOS 80  BILITOT 0.6  PROT 7.6  ALBUMIN 3.8   No results for input(s): LIPASE, AMYLASE in the last 168 hours. No results for input(s): AMMONIA in the last 168 hours. Coagulation Profile:  Recent Labs Lab 11/07/15 2343 11/09/15 0713 11/10/15 0328 11/11/15 0353  INR 1.63 1.94 2.02 2.09   CBC:  Recent Labs Lab 11/07/15 2343  WBC 5.5  HGB 9.5*  HCT 29.2*  MCV 101.0*  PLT 234   Cardiac Enzymes: No results for input(s): CKTOTAL, CKMB, CKMBINDEX, TROPONINI in the last 168 hours. BNP: Invalid input(s):  POCBNP CBG: No results for input(s): GLUCAP in the last 168 hours. HbA1C: No results for input(s): HGBA1C in the last 72 hours. Urine analysis:    Component Value Date/Time   COLORURINE YELLOW 11/08/2015 0518   APPEARANCEUR CLEAR 11/08/2015 0518   LABSPEC 1.010 11/08/2015 0518   PHURINE 7.5 11/08/2015 0518   GLUCOSEU NEGATIVE 11/08/2015 0518   HGBUR SMALL (A) 11/08/2015 0518   BILIRUBINUR NEGATIVE 11/08/2015 0518   KETONESUR NEGATIVE 11/08/2015 0518   PROTEINUR NEGATIVE 11/08/2015 0518   UROBILINOGEN 0.2 12/12/2013 0414   NITRITE NEGATIVE 11/08/2015 0518   LEUKOCYTESUR NEGATIVE 11/08/2015 0518   Sepsis Labs: @LABRCNTIP (procalcitonin:4,lacticidven:4) )No results found for this or any previous visit (from the past 240 hour(s)).   Scheduled Meds: . allopurinol  100 mg Oral Daily  . feeding supplement (ENSURE ENLIVE)  237 mL Oral Q24H  . fluticasone furoate-vilanterol  1 puff Inhalation Daily  . mouth rinse  15 mL Mouth Rinse BID  . methimazole  5 mg Oral Daily  .  metoprolol tartrate  12.5 mg Oral BID  . pantoprazole  40 mg Oral Daily  . senna  1 tablet Oral Daily  . traMADol  50 mg Oral BID  . warfarin  3 mg Oral ONCE-1800  . Warfarin - Pharmacist Dosing Inpatient   Does not apply q1800   Continuous Infusions:   Procedures/Studies: Dg Chest 2 View  Result Date: 11/08/2015 CLINICAL DATA:  COPD and dyspnea. Central chest pain radiating to back for 1 week. EXAM: CHEST  2 VIEW COMPARISON:  09/29/2015 FINDINGS: There is stable cardiomegaly with atherosclerosis of the aortic arch. Clearing of right upper and right lower lobe infiltrate since prior exam. Chronic scarring and/or atelectasis at the left lung base. The lungs are slightly hyperinflated. No overt pulmonary edema, effusion or pneumothorax. Osteoarthritis of the glenohumeral joints with subchondral cystic lucencies of both humeral heads. IMPRESSION: Hyperinflated lungs with stable cardiomegaly. Aortic atherosclerosis.  Osteoarthritis of both humeral heads. Electronically Signed   By: Tollie Ethavid  Kwon M.D.   On: 11/08/2015 00:41   [place] Renal  Result Date: 11/08/2015 CLINICAL DATA:  Acute on chronic renal failure history of hypertension EXAM: RENAL / URINARY TRACT ULTRASOUND COMPLETE COMPARISON:  CT 12/12/2013, ultrasound 07/12/2008 FINDINGS: Right Kidney: Length: 7.8 cm. The right kidney demonstrates increased cortical echogenicity. There is thinning of the renal cortex. Left Kidney: Length: 6.2 cm. Left kidney poorly visualized secondary to bowel gas. No gross hydronephrosis. Bladder: The bladder is grossly unremarkable. There is heterogeneous echotexture of the liver. IMPRESSION: 1. Poor visualization of left kidney due to bowel gas. No hydronephrosis. 2. Small renal size measurements suggest renal atrophy. Increased cortical echogenicity of the right kidney compatible with medical renal disease. 3. Heterogeneous hepatic echotexture. Electronically Signed   By: Jasmine PangKim  Fujinaga M.D.   On: 11/08/2015 19:36   Dg Hip Unilat With Pelvis 2-3 Views Left  Result Date: 11/08/2015 CLINICAL DATA:  Left hip pain EXAM: DG HIP (WITH OR WITHOUT PELVIS) 2-3V LEFT COMPARISON:  07/24/2015 FINDINGS: Moderate degenerative changes of the right hip but without fracture or dislocation. Abnormal left acetabular morphology. Superior migration of left femoral head. The left femoral head is abnormal in appearance with irregular areas of sclerosis lucency and flattening, findings could be secondary to avascular necrosis. There appears to be slight increase collapse of the femoral head. There are vascular calcifications. IMPRESSION: 1. Moderate severe degenerative changes of the left hip with abnormal morphology/remodeling of the left acetabulum. There is flattening, sclerosis and lucency in the left femoral head suggestive of AVN or possible old hip dysplasia. There is superior migration of the left femoral head. There appears to be slight further loss of  height of the left femoral head since the prior study. 2. Moderate arthritic changes of the right hip without acute abnormality. 3. Atherosclerotic vascular calcification. Electronically Signed   By: Jasmine PangKim  Fujinaga M.D.   On: 11/08/2015 19:33    Nickolus Wadding, DO  [facility] Pager [phone]  If 7PM-7AM, please contact night-coverage [email or web address] Password [email or web address] 11/11/2015, 5:43 PM   LOS: 3 days

## 2015-11-11 NOTE — Progress Notes (Signed)
Patient Name: [name] Alexander Date of Encounter: 11/11/2015  Primary Cardiologist: Dr. Karl Lukeroitoru  Hospital Problem List     Principal Problem:   Acute renal failure (ARF) (HCC) Active Problems:   Hypertension   Hyperthyroidism   Normocytic anemia   CKD (chronic kidney disease), stage IV (HCC)   Protein-calorie malnutrition, severe   COPD (chronic obstructive pulmonary disease) (HCC)   Chronic combined systolic and diastolic CHF (congestive heart failure) (HCC)   Permanent atrial fibrillation (HCC)   Acute renal failure superimposed on stage 4 chronic kidney disease (HCC)   Chronic respiratory failure with hypoxia (HCC)   Hypokalemia   Left hip pain   Chest pain   Acute on chronic renal failure (HCC)     Subjective   Feeling well.  Wants to go home.   Inpatient Medications    Scheduled Meds: . allopurinol  100 mg Oral Daily  . feeding supplement (ENSURE ENLIVE)  237 mL Oral Q24H  . fluticasone furoate-vilanterol  1 puff Inhalation Daily  . mouth rinse  15 mL Mouth Rinse BID  . methimazole  5 mg Oral Daily  . metoprolol tartrate  12.5 mg Oral BID  . pantoprazole  40 mg Oral Daily  . senna  1 tablet Oral Daily  . traMADol  50 mg Oral BID  . warfarin  3 mg Oral ONCE-1800  . Warfarin - Pharmacist Dosing Inpatient   Does not apply q1800   Continuous Infusions:   PRN Meds: acetaminophen **OR** acetaminophen, albuterol, HYDROcodone-acetaminophen, ondansetron **OR** ondansetron (ZOFRAN) IV   Vital Signs    Vitals:   11/10/15 0308 11/10/15 1216 11/10/15 2026 11/11/15 0546  BP: (!) 107/52 122/73 114/63 115/68  Pulse: 70 76 78 94  Resp: 19 18 18 18   Temp: 97.7 F (36.5 C) 97.8 F (36.6 C) 98.6 F (37 C) 98.7 F (37.1 C)  TempSrc: Oral Oral Oral Oral  SpO2: 100% 100% 100% 100%  Weight: 45.5 kg (100 lb 6.4 oz)   45.6 kg (100 lb 9.6 oz)  Height:        Intake/Output Summary (Last 24 hours) at 11/11/15 0901 Last data filed at 11/11/15 0851  Gross per 24 hour    Intake              480 ml  Output             1690 ml  Net            -1210 ml   Filed Weights   11/09/15 0600 11/10/15 0308 11/11/15 0546  Weight: 46 kg (101 lb 6.4 oz) 45.5 kg (100 lb 6.4 oz) 45.6 kg (100 lb 9.6 oz)    Physical Exam    GEN: Frail, chronically ill elderly woman in no acute distress.  HEENT: Grossly normal.  Neck: Supple, JVP 2 cm above the clavicle at 45. No  carotid bruits, or masses. Cardiac: RRR, no murmurs, rubs, or gallops. No clubbing, cyanosis, edema.  Radials/DP/PT 2+ and equal bilaterally.  Respiratory:  Respirations regular and unlabored, clear to auscultation bilaterally. GI: Soft, nontender, nondistended, BS + x 4. MS: no deformity or atrophy. Skin: warm and dry, no rash. Neuro:  Strength and sensation are intact. Psych: AAOx3.  Normal affect.  Labs    CBC No results for input(s): WBC, NEUTROABS, HGB, HCT, MCV, PLT in the last 72 hours. Basic Metabolic Panel  Recent Labs  11/09/15 0520 11/10/15 0328 11/11/15 0353  NA  --  136 134*  K  --  3.7 3.4*  CL  --  91* 92*  CO2  --  31 30  GLUCOSE  --  104* 103*  BUN  --  110* 102*  CREATININE  --  4.54* 4.19*  CALCIUM  --  10.3 10.3  MG 2.3  --   --    Liver Function Tests No results for input(s): AST, ALT, ALKPHOS, BILITOT, PROT, ALBUMIN in the last 72 hours. No results for input(s): LIPASE, AMYLASE in the last 72 hours. Cardiac Enzymes No results for input(s): CKTOTAL, CKMB, CKMBINDEX, TROPONINI in the last 72 hours. BNP Invalid input(s): POCBNP D-Dimer No results for input(s): DDIMER in the last 72 hours. Hemoglobin A1C No results for input(s): HGBA1C in the last 72 hours. Fasting Lipid Panel No results for input(s): CHOL, HDL, LDLCALC, TRIG, CHOLHDL, LDLDIRECT in the last 72 hours. Thyroid Function Tests No results for input(s): TSH, T4TOTAL, T3FREE, THYROIDAB in the last 72 hours.  Invalid input(s): FREET3  Telemetry    Atrial fibrillation. PVCs. - Personally  Reviewed  ECG    n/a  Radiology    No results found.  Cardiac Studies   Echo 07/07/15: Study Conclusions  - Left ventricle: The cavity size was normal. There was mild   concentric hypertrophy. Systolic function was mildly reduced. The   estimated ejection fraction was in the range of 45% to 50%.   Diffuse hypokinesis. Doppler parameters are consistent with   restrictive physiology, indicative of decreased left ventricular   diastolic compliance and/or increased left atrial pressure.   Doppler parameters are consistent with elevated ventricular   end-diastolic filling pressure. - Aortic valve: There was trivial regurgitation. - Mitral valve: There was moderate regurgitation directed   posteriorly. - Left atrium: The atrium was moderately dilated. - Right ventricle: Systolic function was mildly to moderately   reduced. - Right atrium: The atrium was moderately dilated. - Tricuspid valve: There was moderate regurgitation. - Pulmonary arteries: Systolic pressure was moderately increased.   PA peak pressure: 44 mm Hg (S).  Impressions:  - There is a change when compared to the prior study from   12/12/2013, LVEF has mildly decreased, now 45% with diffuse   hypokinesis. LVEF is most probably overestimated by significant   MR.   Mitral valve has rheumatic appearance of the anterior leaflet   with posteriorly directed jet of at least moderate and possibly   severe mitral regurgitation.   There is moderate pulmonary hypertension.   RVEF is now mildly to moderately decreased  Patient Profile     [name] Alexander is an 56F with persistent atrial fibrillation, moderate to severe mitral regurgitation, chronic systolic and diastolic heart failure LVEF 45%, non-obstructive CAD and CKD IV here with acute on chronic renal failure in the setting of overdiuresis.   Assessment & Plan    # Chronic systolic and diastolic heart failure:  [name] Alexander is feeling well and denies shortness of  breath.  Her weight is down to 100lb from what was thought to be a dry weight of 106. This is certainly due to diuresis but also due to poor by mouth intake. BNP is 447, which is elevated but lower than it has been for her in the past. Her renal function has improved with holding diuretics. Her oral intake is so poor that even without diuresis she was -1.4 L yesterday.  Will continue to hold oral diuretics today and likely resume lasix 80mg  bid tomorrow.   Ideally she would stay in the hospital so that we  can determine the proper dose of diuretics. However, she is very anxious to go home. If she does get discharged she will need to be seen Thursday or Friday in clinic for a volume assessment and basic metabolic panel. She may need metolazone but I would not start it right away.    # Acute on chronic kidney disease:  Creatinine is down to 4.2 from 4.5 yesterday. Her baseline renal function is around 3.  continue to hold diuretics as above.  # Persistent atrial fibrillation: Rates are well-controlled.  Continue warfarin and metoprolol.  INR 2.09.  # Asymptomatic coronary calcification: Continue metoprolol.  Benefit of statin is limited.  No aspirin given that she is on warfarin.  Signed, Chilton Siiffany Webster, MD  11/11/2015, 9:01 AM

## 2015-11-11 NOTE — Progress Notes (Signed)
Patient is alert in chair un eventful day, vitals stable, would like a bath 12-2 pm during the day.

## 2015-11-11 NOTE — Evaluation (Signed)
Physical Therapy Evaluation Patient Details Name: [name] Alexander Ferris MRN: [id number] DOB: 29-Nov-1933 Today's Date: 11/11/2015   History of Present Illness  [name] Alexander Woodrow is a 82 y.o. female with a past medical history significant for CHF EF 45%, COPD on 3L home O2, anemia, CKD IV baseline Cr 3.0, chronic DVT on warfarin, Afib and hyperthyroidism on methimazole who presents with one week weakness, malaise, decreased UOP, and nausea. Dx of acute renal failure.   Clinical Impression  Pt admitted with above diagnosis. Pt currently with functional limitations due to the deficits listed below (see PT Problem List). Pt ambulated 4218' with RW and 2L O2, distance limited by chronic L knee pain and fatigue. HR 118 walking, SaO2 100% on 2L O2.  Pt will benefit from skilled PT to increase their independence and safety with mobility to allow discharge to the venue listed below.       Follow Up Recommendations Home health PT    Equipment Recommendations  None recommended by PT    Recommendations for Other Services       Precautions / Restrictions Precautions Precautions: Fall Precaution Comments: 1 fall in past 1 year Restrictions Weight Bearing Restrictions: No      Mobility  Bed Mobility               General bed mobility comments: NT- up in chair  Transfers Overall transfer level: Needs assistance Equipment used: Rolling walker (2 wheeled) Transfers: Sit to/from Stand Sit to Stand: Mod assist         General transfer comment: mod A to rise, VCs for hand placement  Ambulation/Gait Ambulation/Gait assistance: Min guard Ambulation Distance (Feet): 18 Feet Assistive device: Rolling walker (2 wheeled) Gait Pattern/deviations: Step-through pattern;Decreased step length - right;Decreased step length - left;Trunk flexed;Decreased weight shift to left   Gait velocity interpretation: Below normal speed for age/gender General Gait Details: distance limited by fatigue and L knee pain  (pt reports this is chronic), ambulated with 2L O2 [place], SaO2 100%, HR 118  Stairs            Wheelchair Mobility    Modified Rankin (Stroke Patients Only)       Balance Overall balance assessment: Needs assistance   Sitting balance-Leahy Scale: Good       Standing balance-Leahy Scale: Poor                               Pertinent Vitals/Pain Pain Assessment: 0-10 Pain Score: 10-Worst pain ever Pain Location: L knee Pain Descriptors / Indicators: Aching Pain Intervention(s): Limited activity within patient's tolerance;Monitored during session;Premedicated before session    Home Living Family/patient expects to be discharged to:: Private residence Living Arrangements: Children Available Help at Discharge: Available 24 hours/day Type of Home: House Home Access: Stairs to enter Entrance Stairs-Rails: Right;Left;Can reach both Entrance Stairs-Number of Steps: 3 Home Layout: One level Home Equipment: Walker - 2 wheels;Shower seat;Other (comment) (oxygen) Additional Comments: Aide 9am-11am M-F    Prior Function Level of Independence: Needs assistance   Gait / Transfers Assistance Needed: Ambulates household distances w/ RW   ADL's / Homemaking Assistance Needed: Assist from aide for bathing and dressing.  (sometimes does sponge bath, sometimes gets in shower)        Hand Dominance   Dominant Hand: Right    Extremity/Trunk Assessment   Upper Extremity Assessment: Generalized weakness           Lower Extremity  Assessment: LLE deficits/detail   LLE Deficits / Details: L knee ext -40* AROM limited by pain  Cervical / Trunk Assessment: Normal  Communication   Communication: HOH  Cognition Arousal/Alertness: Awake/alert Behavior During Therapy: WFL for tasks assessed/performed Overall Cognitive Status: Within Functional Limits for tasks assessed                      General Comments      Exercises     Assessment/Plan    PT  Assessment Patient needs continued PT services  PT Problem List Decreased activity tolerance;Decreased mobility;Decreased balance;Pain          PT Treatment Interventions DME instruction;Gait training;Functional mobility training;Balance training;Therapeutic exercise;Therapeutic activities    PT Goals (Current goals can be found in the Care Plan section)  Acute Rehab PT Goals Patient Stated Goal: to go home PT Goal Formulation: With patient Time For Goal Achievement: 11/25/15 Potential to Achieve Goals: Fair    Frequency Min 3X/week   Barriers to discharge        Co-evaluation               End of Session Equipment Utilized During Treatment: Gait belt;Oxygen Activity Tolerance: Patient limited by fatigue Patient left: in chair;with call bell/phone within reach;with chair alarm set Nurse Communication: Mobility status         Time: [id number] PT Time Calculation (min) (ACUTE ONLY): 24 min   Charges:   PT Evaluation $PT Eval Low Complexity: 1 Procedure PT Treatments $Gait Training: 8-22 mins   PT G Codes:        Tamala SerUhlenberg, Detrell Umscheid Kistler 11/11/2015, 11:08 AM [phone]

## 2015-11-12 DIAGNOSIS — R079 Chest pain, unspecified: Secondary | ICD-10-CM

## 2015-11-12 DIAGNOSIS — M25552 Pain in left hip: Secondary | ICD-10-CM

## 2015-11-12 DIAGNOSIS — D649 Anemia, unspecified: Secondary | ICD-10-CM

## 2015-11-12 DIAGNOSIS — N179 Acute kidney failure, unspecified: Principal | ICD-10-CM

## 2015-11-12 LAB — BASIC METABOLIC PANEL
ANION GAP: 12 (ref 5–15)
BUN: 100 mg/dL — AB (ref 6–20)
CHLORIDE: 93 mmol/L — AB (ref 101–111)
CO2: 30 mmol/L (ref 22–32)
Calcium: 10.7 mg/dL — ABNORMAL HIGH (ref 8.9–10.3)
Creatinine, Ser: 4.13 mg/dL — ABNORMAL HIGH (ref 0.44–1.00)
GFR calc Af Amer: 11 mL/min — ABNORMAL LOW (ref >60)
GFR, EST NON AFRICAN AMERICAN: 9 mL/min — AB (ref >60)
GLUCOSE: 100 mg/dL — AB (ref 65–99)
POTASSIUM: 3.8 mmol/L (ref 3.5–5.1)
Sodium: 135 mmol/L (ref 135–145)

## 2015-11-12 LAB — PROTIME-INR
INR: 2.21
Prothrombin Time: 24.9 seconds — ABNORMAL HIGH (ref 11.4–15.2)

## 2015-11-12 LAB — MAGNESIUM: Magnesium: 2.3 mg/dL (ref 1.7–2.4)

## 2015-11-12 MED ORDER — FUROSEMIDE 80 MG PO TABS
80.0000 mg | ORAL_TABLET | Freq: Every day | ORAL | Status: DC
Start: 2015-11-12 — End: 2015-11-13
  Administered 2015-11-12 – 2015-11-13 (×2): 80 mg via ORAL
  Filled 2015-11-12 (×2): qty 1

## 2015-11-12 MED ORDER — WARFARIN SODIUM 3 MG PO TABS
3.0000 mg | ORAL_TABLET | Freq: Once | ORAL | Status: AC
  Administered 2015-11-12: 3 mg via ORAL
  Filled 2015-11-12: qty 1

## 2015-11-12 NOTE — Progress Notes (Signed)
PROGRESS NOTE  CLARISSA LAIRD Alexander:[id number] DOB: 1933/05/10 DOA: 11/08/2015 PCP: [name], MD  Brief History: 82 year old female with a history of systolic and diastolic CHF, COPD on 3 L nasal cannula, CKD stage IV, DVT on warfarin, atrial fibrillation, hyperthyroidism on methimazole presented with one-week history of malaise, decrease urineoutput, nausea, and anorexia. The patient saw her cardiologist, Dr. Royann Shivers, on 10/17/2015. At that time, the patient was instructed to continue furosemide 60 mg TID and her metolazone was increased to three times per week which was an increase from once per week. The patient was instructed to continue the above-mentioned furosemide dose until she reached a target weight of 105 lbs. According to the patient's son, the patient reached this target weight on approximately 10/21/2015 which was the day that she switched furosemide 80 mg twice a day. She continued on metolazone 2.5 mg on Monday, Wednesday, Friday. The patient's office weight on 10/17/2015 was 106 pounds. Even as the patient began having malaise, nausea, and decreased urineoutput over the past 7 days prior to admission, the patient continued to take the diuretic regimen as discussed. Patient denies fevers, chills, headache, chest pain, vomiting, diarrhea, abdominal pain, dysuria, hematuria, hematochezia, and melena. The patient's son states that her LE edema is the best that it has ever been.  Upon presentation, the patient was started on potassium 2.8 with serum creatinine 4.94. WBC was 5.5 with hemoglobin 9.5. Urinalysis was negative for pyuria. The patient was given 500 mL IV fluid in ED.   Assessment/Plan: Acute on chronic renal failure--CKD stage IV -Baseline creatinine 2.8-3.1 -11/10/15--250 cc fluid IV per cardiology -Presenting creatinine 4.94>>4.43>>4.54>>4.19>4/13 -Held diuretics and monitor -Renal ultrasound--neg for hydronephrosis -Urinalysis bland -hold  bicarbonate - Due to concern for becoming quickly volume overloaded if we continue to hold diuretics, cardiology has initiated Lasix 80 mg daily on 11/1. Metolazone held. Follow BMP in a.m.  Chronic systolic and diastolic CHF -Dry weight approximately 104-105 per records -continue daily weights--stable at 100 lbs since admission -appears euvolemic -appreciate cardiology--defer diuretic regimen to cards >have initiated Lasix as above on 11/1. -07/07/2015 echocardiogram EF 45-50%, moderate MR/TR, PASP 44  Acute metabolic Encephalopathy -secondary to acute on chronic renal failure and IV opioids -d/c dilaudid, start norco prn breakthrough pain -continue home dose of scheduled tramadol -10/31--resolved -TSH 2.377 - Mental status may be at baseline now.  Atrial Fibrillation -CHADSVASc = 5 -continue warfarin -rate controlled -continuemetoprolol tartrate  -d/c diltiazem due to soft BPs - Controlled ventricular rate on the monitor.  Chronic respiratory failure with hypoxia/COPD -Stable on 3 L -continue Breo  ?Hematochezia -family verbalized "blood in panties" -they do not want me to perform rectal exam -check FOBT -Hgb at baseline and stable.  Left hip pain -xray left hip--mod to severe DJD, no fracture or dislocation. Gives history of chronic left hip and knee pain.  HTN -BP well controlled  -hold diltiazem due to soft BP -continue metoprolol tartrate  Hyperthyroidism -Continue methimazole  Severe protein calorie malnutrition -Continue supplements  Deconditioning -PT evaluation >recommend home health PT.  Hypokalemia -repleted -Mag--2.3    Disposition Plan: Home when cleared by cardiology--likely 11/13/15  pending stability of renal functions. Family Communication:home caretaker at bedside updated   Consultants: none  Code Status: FULL   DVT Prophylaxis: Coumadin   Procedures: As Listed in Progress Note  Above  Antibiotics: None    Subjective: Chronic left hip and knee pain. Denies chest pain or dyspnea. No other  complaints reported.   Objective: Vitals:   11/11/15 1500 11/11/15 2120 11/12/15 0447 11/12/15 1227  BP: 112/69 (!) 127/58 110/68 116/73  Pulse: 98 (!) 104 92 81  Resp: 18 16 17 20   Temp: 98 F (36.7 C) 98.3 F (36.8 C) 98.2 F (36.8 C) 98.1 F (36.7 C)  TempSrc: Oral Oral Oral Oral  SpO2: 100% 100% 100% 98%  Weight:   42.1 kg (92 lb 12.8 oz)   Height:        Intake/Output Summary (Last 24 hours) at 11/12/15 1754 Last data filed at 11/12/15 1707  Gross per 24 hour  Intake              240 ml  Output              870 ml  Net             -630 ml   Weight change: -3.538 kg (-7 lb 12.8 oz) Exam:   General:  Pt is alert, follows commands appropriately, not in acute distress  HEENT: No icterus, No thrush, No neck mass, [place]/AT  Cardiovascular: Irregularly irregular, S1/S2, no rubs, no gallops. Telemetry: A. fib with controlled ventricular rate.  Respiratory: CTA bilaterally, no wheezing, no crackles, no rhonchi  Abdomen: Soft/+BS, non tender, non distended, no guarding  Extremities: No edema, No lymphangitis, No petechiae, No rashes, no synovitis  CNS: Alert and oriented 2. No focal neurological deficits.   Data Reviewed: I have personally reviewed following labs and imaging studies Basic Metabolic Panel:  Recent Labs Lab 11/08/15 0556 11/08/15 1348 11/09/15 0315 11/09/15 0520 11/10/15 0328 11/11/15 0353 11/12/15 0345  NA  --  135 134*  --  136 134* 135  K  --  3.4* 3.3*  --  3.7 3.4* 3.8  CL  --  90* 90*  --  91* 92* 93*  CO2  --  29 28  --  31 30 30   GLUCOSE  --  148* 104*  --  104* 103* 100*  BUN  --  112* 107*  --  110* 102* 100*  CREATININE  --  4.70* 4.43*  --  4.54* 4.19* 4.13*  CALCIUM  --  10.5* 10.5*  --  10.3 10.3 10.7*  MG 2.5*  --   --  2.3  --   --  2.3   Liver Function Tests:  Recent Labs Lab 11/08/15 0223  AST 17   ALT 9*  ALKPHOS 80  BILITOT 0.6  PROT 7.6  ALBUMIN 3.8   No results for input(s): LIPASE, AMYLASE in the last 168 hours. No results for input(s): AMMONIA in the last 168 hours. Coagulation Profile:  Recent Labs Lab 11/07/15 2343 11/09/15 0713 11/10/15 0328 11/11/15 0353 11/12/15 0345  INR 1.63 1.94 2.02 2.09 2.21   CBC:  Recent Labs Lab 11/07/15 2343  WBC 5.5  HGB 9.5*  HCT 29.2*  MCV 101.0*  PLT 234   Cardiac Enzymes: No results for input(s): CKTOTAL, CKMB, CKMBINDEX, TROPONINI in the last 168 hours. BNP: Invalid input(s): POCBNP CBG: No results for input(s): GLUCAP in the last 168 hours. HbA1C: No results for input(s): HGBA1C in the last 72 hours. Urine analysis:    Component Value Date/Time   COLORURINE YELLOW 11/08/2015 0518   APPEARANCEUR CLEAR 11/08/2015 0518   LABSPEC 1.010 11/08/2015 0518   PHURINE 7.5 11/08/2015 0518   GLUCOSEU NEGATIVE 11/08/2015 0518   HGBUR SMALL (A) 11/08/2015 0518   BILIRUBINUR NEGATIVE 11/08/2015 0518   KETONESUR NEGATIVE  11/08/2015 0518   PROTEINUR NEGATIVE 11/08/2015 0518   UROBILINOGEN 0.2 12/12/2013 0414   NITRITE NEGATIVE 11/08/2015 0518   LEUKOCYTESUR NEGATIVE 11/08/2015 0518   Sepsis Labs: @LABRCNTIP (procalcitonin:4,lacticidven:4) )No results found for this or any previous visit (from the past 240 hour(s)).   Scheduled Meds: . allopurinol  100 mg Oral Daily  . feeding supplement (ENSURE ENLIVE)  237 mL Oral Q24H  . fluticasone furoate-vilanterol  1 puff Inhalation Daily  . furosemide  80 mg Oral Daily  . mouth rinse  15 mL Mouth Rinse BID  . methimazole  5 mg Oral Daily  . metoprolol tartrate  12.5 mg Oral BID  . pantoprazole  40 mg Oral Daily  . senna  1 tablet Oral Daily  . traMADol  50 mg Oral BID  . warfarin  3 mg Oral ONCE-1800  . Warfarin - Pharmacist Dosing Inpatient   Does not apply q1800   Continuous Infusions:   Procedures/Studies: Dg Chest 2 View  Result Date: 11/08/2015 CLINICAL DATA:   COPD and dyspnea. Central chest pain radiating to back for 1 week. EXAM: CHEST  2 VIEW COMPARISON:  09/29/2015 FINDINGS: There is stable cardiomegaly with atherosclerosis of the aortic arch. Clearing of right upper and right lower lobe infiltrate since prior exam. Chronic scarring and/or atelectasis at the left lung base. The lungs are slightly hyperinflated. No overt pulmonary edema, effusion or pneumothorax. Osteoarthritis of the glenohumeral joints with subchondral cystic lucencies of both humeral heads. IMPRESSION: Hyperinflated lungs with stable cardiomegaly. Aortic atherosclerosis. Osteoarthritis of both humeral heads. Electronically Signed   By: Tollie Ethavid  Kwon M.D.   On: 11/08/2015 00:41   [place] Renal  Result Date: 11/08/2015 CLINICAL DATA:  Acute on chronic renal failure history of hypertension EXAM: RENAL / URINARY TRACT ULTRASOUND COMPLETE COMPARISON:  CT 12/12/2013, ultrasound 07/12/2008 FINDINGS: Right Kidney: Length: 7.8 cm. The right kidney demonstrates increased cortical echogenicity. There is thinning of the renal cortex. Left Kidney: Length: 6.2 cm. Left kidney poorly visualized secondary to bowel gas. No gross hydronephrosis. Bladder: The bladder is grossly unremarkable. There is heterogeneous echotexture of the liver. IMPRESSION: 1. Poor visualization of left kidney due to bowel gas. No hydronephrosis. 2. Small renal size measurements suggest renal atrophy. Increased cortical echogenicity of the right kidney compatible with medical renal disease. 3. Heterogeneous hepatic echotexture. Electronically Signed   By: Jasmine PangKim  Fujinaga M.D.   On: 11/08/2015 19:36   Dg Hip Unilat With Pelvis 2-3 Views Left  Result Date: 11/08/2015 CLINICAL DATA:  Left hip pain EXAM: DG HIP (WITH OR WITHOUT PELVIS) 2-3V LEFT COMPARISON:  07/24/2015 FINDINGS: Moderate degenerative changes of the right hip but without fracture or dislocation. Abnormal left acetabular morphology. Superior migration of left femoral head. The  left femoral head is abnormal in appearance with irregular areas of sclerosis lucency and flattening, findings could be secondary to avascular necrosis. There appears to be slight increase collapse of the femoral head. There are vascular calcifications. IMPRESSION: 1. Moderate severe degenerative changes of the left hip with abnormal morphology/remodeling of the left acetabulum. There is flattening, sclerosis and lucency in the left femoral head suggestive of AVN or possible old hip dysplasia. There is superior migration of the left femoral head. There appears to be slight further loss of height of the left femoral head since the prior study. 2. Moderate arthritic changes of the right hip without acute abnormality. 3. Atherosclerotic vascular calcification. Electronically Signed   By: Jasmine PangKim  Fujinaga M.D.   On: 11/08/2015 19:33  Marcellus ScottHONGALGI,Fabianna Keats, MD, FACP, FHM. [facility] Pager [phone]  If 7PM-7AM, please contact night-coverage [email or web address] Password [email or web address] 11/12/2015, 6:00 PM   LOS: 4 days

## 2015-11-12 NOTE — Progress Notes (Signed)
Patient Name: [name] Alexander Date of Encounter: 11/12/2015  Primary Cardiologist: Dr. Karl Lukeroitoru  Hospital Problem List     Principal Problem:   Acute renal failure (ARF) (HCC) Active Problems:   Hypertension   Hyperthyroidism   Normocytic anemia   CKD (chronic kidney disease), stage IV (HCC)   Protein-calorie malnutrition, severe   COPD (chronic obstructive pulmonary disease) (HCC)   Chronic combined systolic and diastolic CHF (congestive heart failure) (HCC)   Permanent atrial fibrillation (HCC)   Acute renal failure superimposed on stage 4 chronic kidney disease (HCC)   Chronic respiratory failure with hypoxia (HCC)   Hypokalemia   Left hip pain   Chest pain   Acute on chronic renal failure (HCC)     Subjective   Wants to go home. Stable shortness of breath.   Inpatient Medications    Scheduled Meds: . allopurinol  100 mg Oral Daily  . feeding supplement (ENSURE ENLIVE)  237 mL Oral Q24H  . fluticasone furoate-vilanterol  1 puff Inhalation Daily  . mouth rinse  15 mL Mouth Rinse BID  . methimazole  5 mg Oral Daily  . metoprolol tartrate  12.5 mg Oral BID  . pantoprazole  40 mg Oral Daily  . senna  1 tablet Oral Daily  . traMADol  50 mg Oral BID  . warfarin  3 mg Oral ONCE-1800  . Warfarin - Pharmacist Dosing Inpatient   Does not apply q1800   Continuous Infusions:   PRN Meds: acetaminophen **OR** acetaminophen, albuterol, HYDROcodone-acetaminophen, ondansetron **OR** ondansetron (ZOFRAN) IV   Vital Signs    Vitals:   11/11/15 0546 11/11/15 1500 11/11/15 2120 11/12/15 0447  BP: 115/68 112/69 (!) 127/58 110/68  Pulse: 94 98 (!) 104 92  Resp: 18 18 16 17   Temp: 98.7 F (37.1 C) 98 F (36.7 C) 98.3 F (36.8 C) 98.2 F (36.8 C)  TempSrc: Oral Oral Oral Oral  SpO2: 100% 100% 100% 100%  Weight: 100 lb 9.6 oz (45.6 kg)   92 lb 12.8 oz (42.1 kg)  Height:        Intake/Output Summary (Last 24 hours) at 11/12/15 1050 Last data filed at 11/12/15 1015  Gross per 24 hour  Intake              360 ml  Output              950 ml  Net             -590 ml   Filed Weights   11/10/15 0308 11/11/15 0546 11/12/15 0447  Weight: 100 lb 6.4 oz (45.5 kg) 100 lb 9.6 oz (45.6 kg) 92 lb 12.8 oz (42.1 kg)    Physical Exam    GEN: frail chronically ill appearing elderly woman reclining in chair in no acute distress.  HEENT: Grossly normal.  Neck: Supple, ? + JVD, carotid bruits, or masses. Cardiac: RRR, no murmurs, rubs, or gallops. No clubbing, cyanosis, edema.  Radials/DP/PT 2+ and equal bilaterally.  Respiratory:  Respirations regular and unlabored, diminished breath sound bibasilar  GI: Soft, nontender, nondistended, BS + x 4. MS: no deformity or atrophy. Skin: warm and dry, no rash. Neuro:  Strength and sensation are intact. Psych: AAOx3.  Normal affect.  Labs    CBC No results for input(s): WBC, NEUTROABS, HGB, HCT, MCV, PLT in the last 72 hours. Basic Metabolic Panel  Recent Labs  11/11/15 0353 11/12/15 0345  NA 134* 135  K 3.4* 3.8  CL 92* 93*  CO2 30 30  GLUCOSE 103* 100*  BUN 102* 100*  CREATININE 4.19* 4.13*  CALCIUM 10.3 10.7*  MG  --  2.3   Liver Function Tests No results for input(s): AST, ALT, ALKPHOS, BILITOT, PROT, ALBUMIN in the last 72 hours. No results for input(s): LIPASE, AMYLASE in the last 72 hours. Cardiac Enzymes No results for input(s): CKTOTAL, CKMB, CKMBINDEX, TROPONINI in the last 72 hours. BNP Invalid input(s): POCBNP D-Dimer No results for input(s): DDIMER in the last 72 hours. Hemoglobin A1C No results for input(s): HGBA1C in the last 72 hours. Fasting Lipid Panel No results for input(s): CHOL, HDL, LDLCALC, TRIG, CHOLHDL, LDLDIRECT in the last 72 hours. Thyroid Function Tests No results for input(s): TSH, T4TOTAL, T3FREE, THYROIDAB in the last 72 hours.  Invalid input(s): FREET3  Telemetry    Afib at rate of 90-110s- Personally Reviewed  ECG    N/A  Radiology    No results  found.  Cardiac Studies   Echo 07/07/15: Study Conclusions  - Left ventricle: The cavity size was normal. There was mild concentric hypertrophy. Systolic function was mildly reduced. The estimated ejection fraction was in the range of 45% to 50%. Diffuse hypokinesis. Doppler parameters are consistent with restrictive physiology, indicative of decreased left ventricular diastolic compliance and/or increased left atrial pressure. Doppler parameters are consistent with elevated ventricular end-diastolic filling pressure. - Aortic valve: There was trivial regurgitation. - Mitral valve: There was moderate regurgitation directed posteriorly. - Left atrium: The atrium was moderately dilated. - Right ventricle: Systolic function was mildly to moderately reduced. - Right atrium: The atrium was moderately dilated. - Tricuspid valve: There was moderate regurgitation. - Pulmonary arteries: Systolic pressure was moderately increased. PA peak pressure: 44 mm Hg (S).  Impressions:  - There is a change when compared to the prior study from 12/12/2013, LVEF has mildly decreased, now 45% with diffuse hypokinesis. LVEF is most probably overestimated by significant MR. Mitral valve has rheumatic appearance of the anterior leaflet with posteriorly directed jet of at least moderate and possibly severe mitral regurgitation. There is moderate pulmonary hypertension. RVEF is now mildly to moderately decreased  Patient Profile     [name] Alexander is an [age]F with persistent atrial fibrillation, moderate to severe mitral regurgitation, chronic systolic and diastolic heart failure LVEF 45%, non-obstructive CAD and CKD IV here with acute on chronic renal failure in the setting of overdiuresis.   Assessment & Plan    1.Chronic systolic and diastolic heart failure:   - Holding lasix currently with improving renal function. Net I & O negative 440cc/2/8L. Weight today  recorded as 92 lb from 100lb yesterday. Likely inaccurate. Dry weight around 106lb. [name] Alexander has decreased intake by month.  BNP is 447, which is elevated but lower than it has been for her in the past. Plan to resume lasix today. Will defer to MD.    2. Acute on chronic kidney disease:   -Creatinine is down to 4.13 from 4.19 yesterday. Her baseline renal function is around 3.  Lasix as above.   3.  Persistent atrial fibrillation:  - Rates 90-110s.  Continue warfarin and metoprolol.  INR 2.21.  4. Asymptomatic coronary calcification:  - Continue metoprolol.  Benefit of statin is limited.  No aspirin given that [name] Alexander is on warfarin.   Signed, Manson PasseyBhagat,Chais Fehringer, PA  11/12/2015, 10:50 AM

## 2015-11-12 NOTE — Progress Notes (Signed)
IV catheter came out earlier in the shift and talked with pt. about having IV team place another IV line and she stated she didn't want another IV put in because she's going home today.

## 2015-11-12 NOTE — Progress Notes (Signed)
ANTICOAGULATION CONSULT NOTE   Pharmacy Consult for warfarin  Indication: atrial fibrillation, hx DVT  No Known Allergies  Vital Signs: Temp: 98.2 F (36.8 C) (11/01 0447) Temp Source: Oral (11/01 0447) BP: 110/68 (11/01 0447) Pulse Rate: 92 (11/01 0447)  Labs:  Recent Labs  11/10/15 0328 11/11/15 0353 11/12/15 0345  LABPROT 23.2* 23.8* 24.9*  INR 2.02 2.09 2.21  CREATININE 4.54* 4.19* 4.13*    Estimated Creatinine Clearance: 7.2 mL/min (by C-G formula based on SCr of 4.13 mg/dL (H)).  Assessment: [name] on Coumadin 3mg  daily for hx afib. INR low on admission. Now up to 2.21. Hgb low at 9.5, plts wnl. No s/s of bleed.  Goal of Therapy:  INR 2-3 Monitor platelets by anticoagulation protocol: Yes   Plan:  Continue Coumadin 3mg  PO x 1 tonight Monitor daily INR, CBC, s/s of bleed  Enzo BiNathan Hedi Barkan, PharmD, BCPS Clinical Pharmacist Pager [phone] 11/12/2015 8:21 AM

## 2015-11-12 NOTE — Care Management Important Message (Signed)
Important Message  Patient Details  Name: [name] BowenLillie B Calvin MRN: [id number] Date of Birth: 28-Mar-1933   Medicare Important Message Given:  Yes    Daneil Beem Stefan ChurchBratton 11/12/2015, 10:31 AM

## 2015-11-13 DIAGNOSIS — J9611 Chronic respiratory failure with hypoxia: Secondary | ICD-10-CM

## 2015-11-13 DIAGNOSIS — I10 Essential (primary) hypertension: Secondary | ICD-10-CM

## 2015-11-13 LAB — CBC
HEMATOCRIT: 27.8 % — AB (ref 36.0–46.0)
Hemoglobin: 9 g/dL — ABNORMAL LOW (ref 12.0–15.0)
MCH: 32.7 pg (ref 26.0–34.0)
MCHC: 32.4 g/dL (ref 30.0–36.0)
MCV: 101.1 fL — AB (ref 78.0–100.0)
Platelets: 232 10*3/uL (ref 150–400)
RBC: 2.75 MIL/uL — ABNORMAL LOW (ref 3.87–5.11)
RDW: 13.5 % (ref 11.5–15.5)
WBC: 5.4 10*3/uL (ref 4.0–10.5)

## 2015-11-13 LAB — BASIC METABOLIC PANEL
ANION GAP: 11 (ref 5–15)
BUN: 98 mg/dL — ABNORMAL HIGH (ref 6–20)
CALCIUM: 10.6 mg/dL — AB (ref 8.9–10.3)
CO2: 30 mmol/L (ref 22–32)
Chloride: 95 mmol/L — ABNORMAL LOW (ref 101–111)
Creatinine, Ser: 3.97 mg/dL — ABNORMAL HIGH (ref 0.44–1.00)
GFR calc Af Amer: 11 mL/min — ABNORMAL LOW (ref >60)
GFR calc non Af Amer: 10 mL/min — ABNORMAL LOW (ref >60)
GLUCOSE: 97 mg/dL (ref 65–99)
POTASSIUM: 3.8 mmol/L (ref 3.5–5.1)
Sodium: 136 mmol/L (ref 135–145)

## 2015-11-13 LAB — PROTIME-INR
INR: 2.19
Prothrombin Time: 24.7 seconds — ABNORMAL HIGH (ref 11.4–15.2)

## 2015-11-13 MED ORDER — METOPROLOL TARTRATE 25 MG PO TABS: 25.0000 mg | ORAL_TABLET | Freq: Two times a day (BID) | ORAL | Status: DC

## 2015-11-13 MED ORDER — FUROSEMIDE 40 MG PO TABS: 80.0000 mg | ORAL_TABLET | Freq: Every day | ORAL | 0 refills | Status: DC

## 2015-11-13 MED ORDER — WARFARIN SODIUM 3 MG PO TABS: 3.0000 mg | ORAL_TABLET | Freq: Once | ORAL | Status: DC

## 2015-11-13 MED ORDER — METOPROLOL TARTRATE 25 MG PO TABS: 25.0000 mg | ORAL_TABLET | Freq: Two times a day (BID) | ORAL | 0 refills | Status: AC

## 2015-11-13 NOTE — Progress Notes (Signed)
Physical Therapy Treatment Patient Details Name: [name] Alexander Bruster MRN: [id number] DOB: 1933/08/11 Today's Date: 11/13/2015    History of Present Illness [name] Alexander Burkhead is a 82 y.o. female with a past medical history significant for CHF EF 45%, COPD on 3L home O2, anemia, CKD IV baseline Cr 3.0, chronic DVT on warfarin, Afib and hyperthyroidism on methimazole who presents with one week weakness, malaise, decreased UOP, and nausea. Dx of acute renal failure.     PT Comments    Pt presented OOB in recliner when PT entered room. Pt making slow progress with ambulation and continues to be limited secondary to fatigue and reports of L knee and hip pain (chronic). Pt would continue to benefit from skilled physical therapy services at this time while admitted and after d/c to address her limitations in order to improve her overall safety and independence with functional mobility.   Follow Up Recommendations  Home health PT;Supervision for mobility/OOB     Equipment Recommendations  None recommended by PT    Recommendations for Other Services       Precautions / Restrictions Precautions Precautions: Fall Precaution Comments: 1 fall in past 1 year Restrictions Weight Bearing Restrictions: No    Mobility  Bed Mobility               General bed mobility comments: Pt sitting OOB in recliner when PT entered room  Transfers Overall transfer level: Needs assistance Equipment used: Rolling walker (2 wheeled) Transfers: Sit to/from [employer] CorporationStand;Stand Pivot Transfers Sit to Stand: Min assist Stand pivot transfers: Min assist       General transfer comment: pt required increased time, VC'ing for bilateral hand placement and min A to achieve full standing position  Ambulation/Gait Ambulation/Gait assistance: Min guard Ambulation Distance (Feet): 20 Feet Assistive device: Rolling walker (2 wheeled) Gait Pattern/deviations: Step-to pattern;Step-through pattern;Decreased step length -  right;Decreased step length - left;Decreased stride length;Shuffle;Trunk flexed Gait velocity: decreased Gait velocity interpretation: Below normal speed for age/gender General Gait Details: pt moving very slowly with above noted deviations and limited secondary to pain and fatigue. pt ambulated on 2 L O2 via [place].   Stairs            Wheelchair Mobility    Modified Rankin (Stroke Patients Only)       Balance Overall balance assessment: Needs assistance;History of Falls Sitting-balance support: Feet supported;No upper extremity supported Sitting balance-Leahy Scale: Fair     Standing balance support: During functional activity;Bilateral upper extremity supported Standing balance-Leahy Scale: Poor                      Cognition Arousal/Alertness: Awake/alert Behavior During Therapy: WFL for tasks assessed/performed Overall Cognitive Status: No family/caregiver present to determine baseline cognitive functioning                      Exercises General Exercises - Lower Extremity Ankle Circles/Pumps: AROM;Both;10 reps;Seated Long Arc Quad: AROM;Strengthening;Both;10 reps;Seated    General Comments        Pertinent Vitals/Pain Pain Assessment: Faces Faces Pain Scale: Hurts a little bit Pain Location: L hip and knee Pain Descriptors / Indicators: Sore Pain Intervention(s): Monitored during session;Repositioned    Home Living                      Prior Function            PT Goals (current goals can now be found in the care  plan section) Acute Rehab PT Goals Patient Stated Goal: to go home PT Goal Formulation: With patient Time For Goal Achievement: 11/25/15 Potential to Achieve Goals: Fair Progress towards PT goals: Progressing toward goals    Frequency    Min 3X/week      PT Plan Current plan remains appropriate    Co-evaluation             End of Session Equipment Utilized During Treatment: Gait belt;Oxygen (2L O2  via [place]) Activity Tolerance: Patient limited by fatigue Patient left: in chair;with call bell/phone within reach;with chair alarm set     Time: [id number] PT Time Calculation (min) (ACUTE ONLY): 22 min  Charges:  $Gait Training: 8-22 mins                    G CodesAlessandra Bevels:      Amous Crewe M Jahzier Villalon 11/13/2015, 11:17 AM Deborah ChalkJennifer Aron Inge, PT, DPT [phone]

## 2015-11-13 NOTE — Discharge Instructions (Signed)

## 2015-11-13 NOTE — Progress Notes (Signed)
ANTICOAGULATION CONSULT NOTE   Pharmacy Consult for warfarin  Indication: atrial fibrillation, hx DVT  No Known Allergies  Vital Signs: Temp: 98 F (36.7 C) (11/02 0634) Temp Source: Oral (11/02 0634) BP: 120/66 (11/02 0634) Pulse Rate: 92 (11/02 0634)  Labs:  Recent Labs  11/11/15 0353 11/12/15 0345 11/13/15 0439  HGB  --   --  9.0*  HCT  --   --  27.8*  PLT  --   --  232  LABPROT 23.8* 24.9* 24.7*  INR 2.09 2.21 2.19  CREATININE 4.19* 4.13* 3.97*    Estimated Creatinine Clearance: 8 mL/min (by C-G formula based on SCr of 3.97 mg/dL (H)).  Assessment: [name] on Coumadin 3mg  daily for hx afib. INR low on admission. Now INR has been therapeutic at 2.19. Hgb low but stable at 9, plts wnl. No s/s of bleed.  Goal of Therapy:  INR 2-3 Monitor platelets by anticoagulation protocol: Yes   Plan:  Continue Coumadin 3mg  PO x 1 tonight Monitor daily INR, CBC, s/s of bleed  Enzo BiNathan Brittne Kawasaki, PharmD, [facility] Clinical Pharmacist Pager [phone] 11/13/2015 10:29 AM

## 2015-11-13 NOTE — Care Management Note (Signed)
Case Management Note  Patient Details  Name: [name] BowenLillie B Kercheval MRN: [id number] Date of Birth: June 30, 1933  Subjective/Objective:    Admitted with Acute Renal Failure               Action/Plan: Patient lives at home with her son, [facility] to [facility]. DME- home oxygen, rollater and a walker at home. CM talked to her son Elisabeth MostStevenson on the telephone, he has also arranged hired help to assist in her care. No needs identified at this time.  Expected Discharge Date:    11/13/2015              Expected Discharge Plan:  Home w Home Health Services  Discharge planning Services  CM Consult  Post Acute Care Choice:    Choice offered to:  Patient, Adult Children  HH Arranged:  RN, [facility]  Status of Service:  Completed, signed off  Reola MosherChandler, Orphia Mctigue L, RN,MHA,BSN [phone] 11/13/2015, 11:51 AM

## 2015-11-13 NOTE — Discharge Summary (Signed)
Physician Discharge Summary  [name] BowenLillie B Alexander RUE:[id number] DOB: 02-27-1933  PCP: Laurena SlimmerLARK,[name], MD  Admit date: 11/08/2015 Discharge date: 11/13/2015  Recommendations for Outpatient Follow-up:  1. Dr. Margaretmary BayleyPreston Clark, PCP in 1 week. 2. Theodore Demarkhonda Barrett, PA-C/Cardiology on 11/21/15 at 2:30 PM. To be seen with repeat labs (CBC, BMP, PT and INR.  Home Health: PT and RN Equipment/Devices: None    Discharge Condition: Improved and stable  CODE STATUS: Full  Diet recommendation: Heart healthy diet.  Discharge Diagnoses:  Principal Problem:   Acute renal failure (ARF) (HCC) Active Problems:   Hypertension   Hyperthyroidism   Normocytic anemia   CKD (chronic kidney disease), stage IV (HCC)   Protein-calorie malnutrition, severe   COPD (chronic obstructive pulmonary disease) (HCC)   Chronic combined systolic and diastolic CHF (congestive heart failure) (HCC)   Permanent atrial fibrillation (HCC)   Acute renal failure superimposed on stage 4 chronic kidney disease (HCC)   Chronic respiratory failure with hypoxia (HCC)   Hypokalemia   Left hip pain   Chest pain   Acute on chronic renal failure (HCC)   Brief/Interim Summary: 82 year old female with a history of systolic and diastolic CHF, COPD on 3 L nasal cannula, CKD stage IV, DVT on warfarin, atrial fibrillation, hyperthyroidism on methimazole presented with one-week history of malaise, decrease urineoutput, nausea, and anorexia. The patient saw her cardiologist, Dr. Royann Shiversroitoru, on 10/17/2015. At that time, the patient was instructed to continue furosemide 60 mg TID and her metolazone was increased to three times per week which was an increase from once per week. The patient was instructed to continue the above-mentioned furosemide dose until she reached a target weight of 105 lbs. According to the patient's son, the patient reached this target weight on approximately 10/21/2015 which was the day that she switched furosemide 80 mg twice a  day. She continued on metolazone 2.5 mg on Monday, Wednesday, Friday. The patient's office weight on 10/17/2015 was 106 pounds. Even as the patient began having malaise, nausea, and decreased urineoutput over the past 7 days prior to admission, the patient continued to take the diuretic regimen as discussed. Patient denies fevers, chills, headache, chest pain, vomiting, diarrhea, abdominal pain, dysuria, hematuria, hematochezia, and melena. The patient's son states that her LE edema is the best that it has ever been.  Upon presentation, the patient was started on potassium 2.8 with serum creatinine 4.94. WBC was 5.5 with hemoglobin 9.5. Urinalysis was negative for pyuria. The patient was given 500 mL IV fluid in ED.   Assessment/Plan: Acute on chronic renal failure--CKD stage IV -Baseline creatinine 2.8-3.1. Acute kidney injury was felt to be due to over diuresis as outpatient. -11/10/15--250 cc fluid IV per cardiology -Presenting creatinine 4.94>>4.43>>4.54>>4.19>4/13 -Held diuretics and monitor -Renal ultrasound--neg for hydronephrosis -Urinalysis bland - Discontinued bicarbonate due to bicarbonate being in the normal range. - Due to concern for becoming quickly volume overloaded if we continue to hold diuretics, cardiology initiated Lasix 80 mg daily on 11/1. Metolazone held.  - Creatinine slowly improving. Continue current dose of Lasix and close outpatient follow-up with repeat BMP.  Chronic systolic and diastolic CHF -Dry weight approximately 104-105 per records -appears euvolemic -07/07/2015 echocardiogram EF 45-50%, moderate MR/TR, PASP 44 - Difficult to control volume status. Dry weight around 106 pounds. Initiated Lasix 80 mg daily yesterday with good diuresis and improving renal functions. Discussed with cardiology and okay to discharge on current Lasix with close outpatient follow-up with BMP.  Acute metabolic Encephalopathy -secondary to acute on  chronic renal failure  and IV opioids -continue home dose of scheduled tramadol -10/31--resolved -TSH 2.377 - Mental status may be at baseline now.  Atrial Fibrillation -CHADSVASc = 5 -continue warfarin. Anticoagulated. -rate controlled -continuemetoprolol tartrate >dose increased today to 25 MG twice a day due to an episode of NSVT - Diltiazem was discontinued during the course of this admission. - Controlled ventricular rate on the monitor.  Chronic respiratory failure with hypoxia/COPD -Stable on 3 L  ?Hematochezia -family verbalized "blood in panties" -they do not want Dr. Arbutus Leas to perform rectal exam -Hgb at baseline and stable. - No further complaints reported. Outpatient follow-up as needed.  Left hip pain -xray left hip--mod to severe DJD, no fracture or dislocation. X-ray suggestive of avascular necrosis or possible old hip dysplasia and superior migration of the left femoral head. Gives history of chronic left hip and knee pain. Not a candidate for aggressive intervention.  HTN -BP well controlled  -Diltiazem discontinued this admission -continue metoprolol tartrate-dose increased as above  Hyperthyroidism -Continue methimazole. Clinically euthyroid. Outpatient follow-up.  Severe protein calorie malnutrition -Continue supplements  Deconditioning -PT evaluation >recommend home health PT.  Hypokalemia -repleted -Mag--2.3  NSVT - Patient had a 14 beat nonsustained VT on 11/12/15. Reviewed with cardiology. Beta blockers increased. No other intervention.  Anemia Stable. Outpatient follow-up.    Discharge Instructions  Discharge Instructions    (HEART FAILURE PATIENTS) Call MD:  Anytime you have any of the following symptoms: 1) 3 pound weight gain in 24 hours or 5 pounds in 1 week 2) shortness of breath, with or without a dry hacking cough 3) swelling in the hands, feet or stomach 4) if you have to sleep on extra pillows at night in order to breathe.    Complete by:  As  directed    Call MD for:  difficulty breathing, headache or visual disturbances    Complete by:  As directed    Call MD for:  extreme fatigue    Complete by:  As directed    Call MD for:  persistant dizziness or light-headedness    Complete by:  As directed    Call MD for:  severe uncontrolled pain    Complete by:  As directed    Diet - low sodium heart healthy    Complete by:  As directed    Increase activity slowly    Complete by:  As directed        Medication List    STOP taking these medications   albuterol 108 (90 Base) MCG/ACT inhaler Commonly known as:  PROVENTIL HFA;VENTOLIN HFA   diltiazem 120 MG 12 hr capsule Commonly known as:  CARDIZEM SR   metolazone 2.5 MG tablet Commonly known as:  ZAROXOLYN   sodium bicarbonate 650 MG tablet     TAKE these medications   acetaminophen 650 MG CR tablet Commonly known as:  TYLENOL Take 1,300 mg by mouth 3 (three) times daily as needed for pain.   allopurinol 100 MG tablet Commonly known as:  ZYLOPRIM Take 1 tablet (100 mg total) by mouth every morning. What changed:  when to take this   feeding supplement (ENSURE COMPLETE) Liqd Take 237 mLs by mouth 2 (two) times daily between meals.   furosemide 40 MG tablet Commonly known as:  LASIX Take 2 tablets (80 mg total) by mouth daily. What changed:  medication strength  when to take this   methimazole 5 MG tablet Commonly known as:  TAPAZOLE Take 5 mg by  mouth daily.   metoprolol tartrate 25 MG tablet Commonly known as:  LOPRESSOR Take 1 tablet (25 mg total) by mouth 2 (two) times daily. What changed:  how much to take   OXYGEN Inhale 2 L into the lungs continuous.   pantoprazole 40 MG tablet Commonly known as:  PROTONIX Take 1 tablet (40 mg total) by mouth daily.   senna 8.6 MG Tabs tablet Commonly known as:  SENOKOT Take 1 tablet (8.6 mg total) by mouth daily.   traMADol 50 MG tablet Commonly known as:  ULTRAM Take 50 mg by mouth 2 (two) times  daily.   warfarin 1 MG tablet Commonly known as:  COUMADIN Take 3 mg by mouth daily at 6 PM.      Follow-up Information    Barrett, Rhonda, PA-C. Go on 11/21/2015.   Specialties:  Cardiology, Radiology Why:  @2 :30 for post hospital. To be seen with repeat labs (CBC, BMP, PT & INR). Contact information: [address] [place] [address] [phone]        Laurena SlimmerLARK,[name], MD. Schedule an appointment as soon as possible for a visit in 1 week(s).   Specialty:  Internal Medicine Contact information: [address] [place] [address] [phone]          No Known Allergies  Consultations:  Cardiology   Procedures/Studies: Dg Chest 2 View  Result Date: 11/08/2015 CLINICAL DATA:  COPD and dyspnea. Central chest pain radiating to back for 1 week. EXAM: CHEST  2 VIEW COMPARISON:  09/29/2015 FINDINGS: There is stable cardiomegaly with atherosclerosis of the aortic arch. Clearing of right upper and right lower lobe infiltrate since prior exam. Chronic scarring and/or atelectasis at the left lung base. The lungs are slightly hyperinflated. No overt pulmonary edema, effusion or pneumothorax. Osteoarthritis of the glenohumeral joints with subchondral cystic lucencies of both humeral heads. IMPRESSION: Hyperinflated lungs with stable cardiomegaly. Aortic atherosclerosis. Osteoarthritis of both humeral heads. Electronically Signed   By: Tollie Ethavid  Kwon M.D.   On: 11/08/2015 00:41   [place] Renal  Result Date: 11/08/2015 CLINICAL DATA:  Acute on chronic renal failure history of hypertension EXAM: RENAL / URINARY TRACT ULTRASOUND COMPLETE COMPARISON:  CT 12/12/2013, ultrasound 07/12/2008 FINDINGS: Right Kidney: Length: 7.8 cm. The right kidney demonstrates increased cortical echogenicity. There is thinning of the renal cortex. Left Kidney: Length: 6.2 cm. Left kidney poorly visualized secondary to bowel gas. No gross hydronephrosis. Bladder: The bladder is  grossly unremarkable. There is heterogeneous echotexture of the liver. IMPRESSION: 1. Poor visualization of left kidney due to bowel gas. No hydronephrosis. 2. Small renal size measurements suggest renal atrophy. Increased cortical echogenicity of the right kidney compatible with medical renal disease. 3. Heterogeneous hepatic echotexture. Electronically Signed   By: Jasmine PangKim  Fujinaga M.D.   On: 11/08/2015 19:36   Dg Hip Unilat With Pelvis 2-3 Views Left  Result Date: 11/08/2015 CLINICAL DATA:  Left hip pain EXAM: DG HIP (WITH OR WITHOUT PELVIS) 2-3V LEFT COMPARISON:  07/24/2015 FINDINGS: Moderate degenerative changes of the right hip but without fracture or dislocation. Abnormal left acetabular morphology. Superior migration of left femoral head. The left femoral head is abnormal in appearance with irregular areas of sclerosis lucency and flattening, findings could be secondary to avascular necrosis. There appears to be slight increase collapse of the femoral head. There are vascular calcifications. IMPRESSION: 1. Moderate severe degenerative changes of the left hip with abnormal morphology/remodeling of the left acetabulum. There is flattening, sclerosis and lucency in the  left femoral head suggestive of AVN or possible old hip dysplasia. There is superior migration of the left femoral head. There appears to be slight further loss of height of the left femoral head since the prior study. 2. Moderate arthritic changes of the right hip without acute abnormality. 3. Atherosclerotic vascular calcification. Electronically Signed   By: Jasmine Pang M.D.   On: 11/08/2015 19:33      Subjective: Denies complaints. Anxious to go home. No dyspnea or chest pain reported. Chronic left hip and knee pain-has not complained today.  Discharge Exam:  Vitals:   11/12/15 1227 11/12/15 1954 11/13/15 0634 11/13/15 0856  BP: 116/73 (!) 124/59 120/66   Pulse: 81 93 92   Resp: 20 18 18    Temp: 98.1 F (36.7 C) 98.4 F  (36.9 C) 98 F (36.7 C)   TempSrc: Oral Oral Oral   SpO2: 98% 100% 100% 98%  Weight:   45 kg (99 lb 4.8 oz)   Height:         General:  Pt is alert, follows commands appropriately, not in acute distress  HEENT: No icterus, No thrush, No neck mass, [place]/AT  Cardiovascular: Irregularly irregular, S1/S2, no rubs, no gallops. Telemetry: A. fib with controlled ventricular rate.  Respiratory: CTA bilaterally, no wheezing, no crackles, no rhonchi  Abdomen: Soft/+BS, non tender, non distended, no guarding  Extremities: No edema, No lymphangitis, No petechiae, No rashes, no synovitis  CNS: Alert and oriented 2. No focal neurological deficits.    The results of significant diagnostics from this hospitalization (including imaging, microbiology, ancillary and laboratory) are listed below for reference.     Microbiology: No results found for this or any previous visit (from the past 240 hour(s)).   Labs: BNP (last 3 results)  Recent Labs  07/31/15 1156 09/28/15 1145 11/10/15 1612  BNP 1,181.9* 625.4* 447.8*   Basic Metabolic Panel:  Recent Labs Lab 11/08/15 0556  11/09/15 0315 11/09/15 0520 11/10/15 0328 11/11/15 0353 11/12/15 0345 11/13/15 0439  NA  --   < > 134*  --  136 134* 135 136  K  --   < > 3.3*  --  3.7 3.4* 3.8 3.8  CL  --   < > 90*  --  91* 92* 93* 95*  CO2  --   < > 28  --  31 30 30 30   GLUCOSE  --   < > 104*  --  104* 103* 100* 97  BUN  --   < > 107*  --  110* 102* 100* 98*  CREATININE  --   < > 4.43*  --  4.54* 4.19* 4.13* 3.97*  CALCIUM  --   < > 10.5*  --  10.3 10.3 10.7* 10.6*  MG 2.5*  --   --  2.3  --   --  2.3  --   < > = values in this interval not displayed. Liver Function Tests:  Recent Labs Lab 11/08/15 0223  AST 17  ALT 9*  ALKPHOS 80  BILITOT 0.6  PROT 7.6  ALBUMIN 3.8   No results for input(s): LIPASE, AMYLASE in the last 168 hours. No results for input(s): AMMONIA in the last 168 hours. CBC:  Recent Labs Lab 11/07/15 2343  11/13/15 0439  WBC 5.5 5.4  HGB 9.5* 9.0*  HCT 29.2* 27.8*  MCV 101.0* 101.1*  PLT 234 232   Urinalysis    Component Value Date/Time   COLORURINE YELLOW 11/08/2015 0518   APPEARANCEUR CLEAR  11/08/2015 0518   LABSPEC 1.010 11/08/2015 0518   PHURINE 7.5 11/08/2015 0518   GLUCOSEU NEGATIVE 11/08/2015 0518   HGBUR SMALL (A) 11/08/2015 0518   BILIRUBINUR NEGATIVE 11/08/2015 0518   KETONESUR NEGATIVE 11/08/2015 0518   PROTEINUR NEGATIVE 11/08/2015 0518   UROBILINOGEN 0.2 12/12/2013 0414   NITRITE NEGATIVE 11/08/2015 0518   LEUKOCYTESUR NEGATIVE 11/08/2015 0518   Discussed in detail with patient's son. Updated care and answered questions.   Time coordinating discharge: Over 30 minutes  SIGNED:  Marcellus Scott, MD, FACP, FHM. [facility] Pager [phone]  If 7PM-7AM, please contact night-coverage [email or web address] Password [email or web address] 11/13/2015, 2:25 PM

## 2015-11-13 NOTE — Progress Notes (Signed)
Patient Name: [name] Alexander Date of Encounter: 11/13/2015  Primary Cardiologist: Dr. Karl Lukeroitoru  Hospital Problem List     Principal Problem:   Acute renal failure (ARF) (HCC) Active Problems:   Hypertension   Hyperthyroidism   Normocytic anemia   CKD (chronic kidney disease), stage IV (HCC)   Protein-calorie malnutrition, severe   COPD (chronic obstructive pulmonary disease) (HCC)   Chronic combined systolic and diastolic CHF (congestive heart failure) (HCC)   Permanent atrial fibrillation (HCC)   Acute renal failure superimposed on stage 4 chronic kidney disease (HCC)   Chronic respiratory failure with hypoxia (HCC)   Hypokalemia   Left hip pain   Chest pain   Acute on chronic renal failure (HCC)     Subjective   Improving dyspnea. Wants to go home today.   Inpatient Medications    Scheduled Meds: . allopurinol  100 mg Oral Daily  . feeding supplement (ENSURE ENLIVE)  237 mL Oral Q24H  . fluticasone furoate-vilanterol  1 puff Inhalation Daily  . furosemide  80 mg Oral Daily  . mouth rinse  15 mL Mouth Rinse BID  . methimazole  5 mg Oral Daily  . metoprolol tartrate  12.5 mg Oral BID  . pantoprazole  40 mg Oral Daily  . senna  1 tablet Oral Daily  . traMADol  50 mg Oral BID  . Warfarin - Pharmacist Dosing Inpatient   Does not apply q1800   Continuous Infusions:   PRN Meds: acetaminophen **OR** acetaminophen, albuterol, HYDROcodone-acetaminophen, ondansetron **OR** ondansetron (ZOFRAN) IV   Vital Signs    Vitals:   11/12/15 1227 11/12/15 1954 11/13/15 0634 11/13/15 0856  BP: 116/73 (!) 124/59 120/66   Pulse: 81 93 92   Resp: 20 18 18    Temp: 98.1 F (36.7 C) 98.4 F (36.9 C) 98 F (36.7 C)   TempSrc: Oral Oral Oral   SpO2: 98% 100% 100% 98%  Weight:   99 lb 4.8 oz (45 kg)   Height:        Intake/Output Summary (Last 24 hours) at 11/13/15 0956 Last data filed at 11/13/15 0700  Gross per 24 hour  Intake              420 ml  Output              1220 ml  Net             -800 ml   Filed Weights   11/11/15 0546 11/12/15 0447 11/13/15 0634  Weight: 100 lb 9.6 oz (45.6 kg) 92 lb 12.8 oz (42.1 kg) 99 lb 4.8 oz (45 kg)    Physical Exam    GEN: frail chronically ill appearing elderly woman reclining in chair in no acute distress.  HEENT: Grossly normal.  Neck: Supple, without JVD, carotid bruits, or masses. Cardiac: RRR, no murmurs, rubs, or gallops. No clubbing, cyanosis, edema.  Radials/DP/PT 2+ and equal bilaterally.  Respiratory:  Respirations regular and unlabored, diminished breath sound bibasilar  GI: Soft, nontender, nondistended, BS + x 4. MS: no deformity or atrophy. Skin: warm and dry, no rash. Neuro:  Strength and sensation are intact. Psych: AAOx3.  Normal affect.  Labs    CBC  Recent Labs  11/13/15 0439  WBC 5.4  HGB 9.0*  HCT 27.8*  MCV 101.1*  PLT 232   Basic Metabolic Panel  Recent Labs  11/12/15 0345 11/13/15 0439  NA 135 136  K 3.8 3.8  CL 93* 95*  CO2 30  30  GLUCOSE 100* 97  BUN 100* 98*  CREATININE 4.13* 3.97*  CALCIUM 10.7* 10.6*  MG 2.3  --    Liver Function Tests No results for input(s): AST, ALT, ALKPHOS, BILITOT, PROT, ALBUMIN in the last 72 hours. No results for input(s): LIPASE, AMYLASE in the last 72 hours. Cardiac Enzymes No results for input(s): CKTOTAL, CKMB, CKMBINDEX, TROPONINI in the last 72 hours. BNP Invalid input(s): POCBNP D-Dimer No results for input(s): DDIMER in the last 72 hours. Hemoglobin A1C No results for input(s): HGBA1C in the last 72 hours. Fasting Lipid Panel No results for input(s): CHOL, HDL, LDLCALC, TRIG, CHOLHDL, LDLDIRECT in the last 72 hours. Thyroid Function Tests No results for input(s): TSH, T4TOTAL, T3FREE, THYROIDAB in the last 72 hours.  Invalid input(s): FREET3  Telemetry    Afib at rate of 90-110s- intermittently goes to 130s Personally Reviewed  ECG    N/A  Radiology    No results found.  Cardiac Studies   Echo  07/07/15: Study Conclusions  - Left ventricle: The cavity size was normal. There was mild concentric hypertrophy. Systolic function was mildly reduced. The estimated ejection fraction was in the range of 45% to 50%. Diffuse hypokinesis. Doppler parameters are consistent with restrictive physiology, indicative of decreased left ventricular diastolic compliance and/or increased left atrial pressure. Doppler parameters are consistent with elevated ventricular end-diastolic filling pressure. - Aortic valve: There was trivial regurgitation. - Mitral valve: There was moderate regurgitation directed posteriorly. - Left atrium: The atrium was moderately dilated. - Right ventricle: Systolic function was mildly to moderately reduced. - Right atrium: The atrium was moderately dilated. - Tricuspid valve: There was moderate regurgitation. - Pulmonary arteries: Systolic pressure was moderately increased. PA peak pressure: 44 mm Hg (S).  Impressions:  - There is a change when compared to the prior study from 12/12/2013, LVEF has mildly decreased, now 45% with diffuse hypokinesis. LVEF is most probably overestimated by significant MR. Mitral valve has rheumatic appearance of the anterior leaflet with posteriorly directed jet of at least moderate and possibly severe mitral regurgitation. There is moderate pulmonary hypertension. RVEF is now mildly to moderately decreased  Patient Profile     [name] is an [age]F with persistent atrial fibrillation, moderate to severe mitral regurgitation, chronic systolic and diastolic heart failure LVEF 45%, non-obstructive CAD and CKD IV here with acute on chronic renal failure in the setting of overdiuresis.   Assessment & Plan    1.Chronic systolic and diastolic heart failure:   - difficult to control volume status.  Net I & O negative negative 3.5L however total weight down 1 lb (100-->99lb).  Dry weight around 106lb.  She has decreased intake by month.  BNP is 447, which is elevated but lower than it has been for her in the past. Resumed lasix 80mg  po daily yesterday with good diuresis and improved renal function to 3.97 from 4.13.   2. Acute on chronic kidney disease:   -Improved today. Her baseline renal function is around 3.  Lasix as above.   3.  Persistent atrial fibrillation:  - Rates 90-110s. Intermittently in 130s.  Continue warfarin and metoprolol 12.5mg .  ? Increase dose of BB. BP of 120/60s.   4. Asymptomatic coronary calcification:  - Continue metoprolol.  Benefit of statin is limited.  No aspirin given that she is on warfarin.    Signed, Manson PasseyBhagat,Keandra Medero, PA  11/13/2015, 9:56 AM

## 2015-11-14 ENCOUNTER — Ambulatory Visit: Payer: Medicare Other | Admitting: Cardiology

## 2015-11-14 DIAGNOSIS — I130 Hypertensive heart and chronic kidney disease with heart failure and stage 1 through stage 4 chronic kidney disease, or unspecified chronic kidney disease: Secondary | ICD-10-CM | POA: Diagnosis not present

## 2015-11-14 DIAGNOSIS — I82403 Acute embolism and thrombosis of unspecified deep veins of lower extremity, bilateral: Secondary | ICD-10-CM | POA: Diagnosis not present

## 2015-11-14 DIAGNOSIS — J441 Chronic obstructive pulmonary disease with (acute) exacerbation: Secondary | ICD-10-CM | POA: Diagnosis not present

## 2015-11-14 DIAGNOSIS — E43 Unspecified severe protein-calorie malnutrition: Secondary | ICD-10-CM | POA: Diagnosis not present

## 2015-11-14 DIAGNOSIS — I5030 Unspecified diastolic (congestive) heart failure: Secondary | ICD-10-CM | POA: Diagnosis not present

## 2015-11-14 DIAGNOSIS — N184 Chronic kidney disease, stage 4 (severe): Secondary | ICD-10-CM | POA: Diagnosis not present

## 2015-11-14 DIAGNOSIS — E039 Hypothyroidism, unspecified: Secondary | ICD-10-CM | POA: Diagnosis not present

## 2015-11-14 DIAGNOSIS — Z5181 Encounter for therapeutic drug level monitoring: Secondary | ICD-10-CM | POA: Diagnosis not present

## 2015-11-14 DIAGNOSIS — Z7901 Long term (current) use of anticoagulants: Secondary | ICD-10-CM | POA: Diagnosis not present

## 2015-11-14 DIAGNOSIS — Z7952 Long term (current) use of systemic steroids: Secondary | ICD-10-CM | POA: Diagnosis not present

## 2015-11-17 DIAGNOSIS — I5030 Unspecified diastolic (congestive) heart failure: Secondary | ICD-10-CM | POA: Diagnosis not present

## 2015-11-17 DIAGNOSIS — Z7901 Long term (current) use of anticoagulants: Secondary | ICD-10-CM | POA: Diagnosis not present

## 2015-11-17 DIAGNOSIS — J441 Chronic obstructive pulmonary disease with (acute) exacerbation: Secondary | ICD-10-CM | POA: Diagnosis not present

## 2015-11-17 DIAGNOSIS — I82403 Acute embolism and thrombosis of unspecified deep veins of lower extremity, bilateral: Secondary | ICD-10-CM | POA: Diagnosis not present

## 2015-11-17 DIAGNOSIS — E039 Hypothyroidism, unspecified: Secondary | ICD-10-CM | POA: Diagnosis not present

## 2015-11-17 DIAGNOSIS — I130 Hypertensive heart and chronic kidney disease with heart failure and stage 1 through stage 4 chronic kidney disease, or unspecified chronic kidney disease: Secondary | ICD-10-CM | POA: Diagnosis not present

## 2015-11-17 DIAGNOSIS — Z7952 Long term (current) use of systemic steroids: Secondary | ICD-10-CM | POA: Diagnosis not present

## 2015-11-17 DIAGNOSIS — E43 Unspecified severe protein-calorie malnutrition: Secondary | ICD-10-CM | POA: Diagnosis not present

## 2015-11-17 DIAGNOSIS — N184 Chronic kidney disease, stage 4 (severe): Secondary | ICD-10-CM | POA: Diagnosis not present

## 2015-11-17 DIAGNOSIS — Z5181 Encounter for therapeutic drug level monitoring: Secondary | ICD-10-CM | POA: Diagnosis not present

## 2015-11-18 ENCOUNTER — Telehealth (INDEPENDENT_AMBULATORY_CARE_PROVIDER_SITE_OTHER): Payer: Self-pay | Admitting: *Deleted

## 2015-11-18 DIAGNOSIS — E039 Hypothyroidism, unspecified: Secondary | ICD-10-CM | POA: Diagnosis not present

## 2015-11-18 DIAGNOSIS — I82403 Acute embolism and thrombosis of unspecified deep veins of lower extremity, bilateral: Secondary | ICD-10-CM | POA: Diagnosis not present

## 2015-11-18 DIAGNOSIS — Z7901 Long term (current) use of anticoagulants: Secondary | ICD-10-CM | POA: Diagnosis not present

## 2015-11-18 DIAGNOSIS — J441 Chronic obstructive pulmonary disease with (acute) exacerbation: Secondary | ICD-10-CM | POA: Diagnosis not present

## 2015-11-18 DIAGNOSIS — Z5181 Encounter for therapeutic drug level monitoring: Secondary | ICD-10-CM | POA: Diagnosis not present

## 2015-11-18 DIAGNOSIS — I130 Hypertensive heart and chronic kidney disease with heart failure and stage 1 through stage 4 chronic kidney disease, or unspecified chronic kidney disease: Secondary | ICD-10-CM | POA: Diagnosis not present

## 2015-11-18 DIAGNOSIS — E43 Unspecified severe protein-calorie malnutrition: Secondary | ICD-10-CM | POA: Diagnosis not present

## 2015-11-18 DIAGNOSIS — I5030 Unspecified diastolic (congestive) heart failure: Secondary | ICD-10-CM | POA: Diagnosis not present

## 2015-11-18 DIAGNOSIS — N184 Chronic kidney disease, stage 4 (severe): Secondary | ICD-10-CM | POA: Diagnosis not present

## 2015-11-18 DIAGNOSIS — Z7952 Long term (current) use of systemic steroids: Secondary | ICD-10-CM | POA: Diagnosis not present

## 2015-11-18 MED ORDER — TRAMADOL HCL 50 MG PO TABS: 50.0000 mg | ORAL_TABLET | Freq: Two times a day (BID) | ORAL | 0 refills | Status: DC

## 2015-11-18 NOTE — Telephone Encounter (Signed)
Pt. Son called stating pt needs refill for tramadol. Call back number is [phone]

## 2015-11-18 NOTE — Telephone Encounter (Signed)
Please advise 

## 2015-11-18 NOTE — Telephone Encounter (Signed)
Refill #30

## 2015-11-18 NOTE — Telephone Encounter (Signed)
Called [facility] gave verbal order

## 2015-11-21 ENCOUNTER — Ambulatory Visit (INDEPENDENT_AMBULATORY_CARE_PROVIDER_SITE_OTHER): Payer: Medicare Other | Admitting: Physician Assistant

## 2015-11-21 ENCOUNTER — Encounter: Payer: Self-pay | Admitting: Physician Assistant

## 2015-11-21 VITALS — BP 115/70 | HR 79 | Ht 64.0 in | Wt 103.2 lb

## 2015-11-21 DIAGNOSIS — L03119 Cellulitis of unspecified part of limb: Secondary | ICD-10-CM | POA: Diagnosis not present

## 2015-11-21 DIAGNOSIS — M25521 Pain in right elbow: Secondary | ICD-10-CM

## 2015-11-21 DIAGNOSIS — I5042 Chronic combined systolic (congestive) and diastolic (congestive) heart failure: Secondary | ICD-10-CM | POA: Diagnosis not present

## 2015-11-21 DIAGNOSIS — I2510 Atherosclerotic heart disease of native coronary artery without angina pectoris: Secondary | ICD-10-CM

## 2015-11-21 LAB — BASIC METABOLIC PANEL
BUN: 95 mg/dL — AB (ref 7–25)
CO2: 26 mmol/L (ref 20–31)
Calcium: 10.5 mg/dL — ABNORMAL HIGH (ref 8.6–10.4)
Chloride: 96 mmol/L — ABNORMAL LOW (ref 98–110)
Creat: 4.06 mg/dL — ABNORMAL HIGH (ref 0.60–0.88)
Glucose, Bld: 96 mg/dL (ref 65–99)
POTASSIUM: 3.2 mmol/L — AB (ref 3.5–5.3)
SODIUM: 137 mmol/L (ref 135–146)

## 2015-11-21 LAB — CBC
HEMATOCRIT: 31.2 % — AB (ref 35.0–45.0)
Hemoglobin: 9.7 g/dL — ABNORMAL LOW (ref 11.7–15.5)
MCH: 32.9 pg (ref 27.0–33.0)
MCHC: 31.1 g/dL — ABNORMAL LOW (ref 32.0–36.0)
MCV: 105.8 fL — ABNORMAL HIGH (ref 80.0–100.0)
MPV: 10.7 fL (ref 7.5–12.5)
Platelets: 311 10*3/uL (ref 140–400)
RBC: 2.95 MIL/uL — ABNORMAL LOW (ref 3.80–5.10)
RDW: 14.1 % (ref 11.0–15.0)
WBC: 4.8 10*3/uL (ref 3.8–10.8)

## 2015-11-21 MED ORDER — CEPHALEXIN 250 MG PO CAPS: 250.0000 mg | ORAL_CAPSULE | ORAL | 0 refills | Status: DC

## 2015-11-21 NOTE — Progress Notes (Signed)
Cardiology Office Note   Date:  11/21/2015   ID:  [name] Alexander, DOB [birth year], MRN [id number]  PCP:  Laurena SlimmerLARK,[name], MD  Cardiologist:  Dr Royann Shiversroitoru 10/17/2015  Theodore DemarkBarrett, Rossie Bretado, PA-C   Chief Complaint  Patient presents with  . Follow-up    post hosp    History of Present Illness: [name] Alexander is a 82 y.o. female with a history of S-D-CHF, CKD IV, COPD on home O2, DVT on coumadin, CHADSVASc = 5, AFIB, hyperthyroid  10/06, seen by Dr Royann Shiversroitoru, on Lasix 60 mg tid and metolazone increased to  3 x wk from once. At wt 105 lbs, changed to Lasix 80 mg bid, continued metolazone 3 x wk. Pt w/ sx x 1 week>>hospital Admit 10/28-11/02 for ARF w/ K+ 2.8, BUN 118, Cr 4.94 on admit, 98/3.96 at d/c. On Lasix at 80 mg qd; 14 bts NSVT seen; d/c wt 99 lbs  [name] Alexander presents for post-hospital f/u.  Since d/c from the hospital, her weight has not increased significantly. According to family with her today, her weight is staying right at 100 lbs. She feels her respiratory status is at baseline. She sleeps in a recliner most of the time, her preference.  She has not had chest pain.   She has a sore area on her sacral area, not raw according to family.  She has a sore R elbow and it is tender. She also has pain with movement of that joint. She has no history of injury to the elbow. There is a very small abrasion, no drainage or signs of infection. She uses that elbow to get her out of the bed or chair and up to the BR. She has been using a bedside commode more due to the elbow problems.   She has been having itching to her R wrist. There is some redness to an IV site on her R wrist. The area is tender. No fevers or chills.   Past Medical History:  Diagnosis Date  . Arthritis    "hips, knees, shoulders" (07/31/2015)  . Chronic diastolic CHF (congestive heart failure), NYHA class 2 (HCC)   . CKD (chronic kidney disease), stage IV (HCC)   . COPD (chronic obstructive pulmonary disease)  (HCC)   . DVT (deep venous thrombosis) (HCC) "2-3 times"  . GERD (gastroesophageal reflux disease)   . Gout   . Hypertension   . Hyperthyroidism   . On home oxygen therapy    "2L; 24/7" (07/31/2015)  . Pneumonia   . Thyroid disease     Past Surgical History:  Procedure Laterality Date  . CATARACT EXTRACTION W/ INTRAOCULAR LENS  IMPLANT, BILATERAL Bilateral   . JOINT REPLACEMENT    . TOTAL KNEE ARTHROPLASTY Right 06/2004   [name]/notes 05/26/2010  . VAGINAL HYSTERECTOMY      Current Outpatient Prescriptions  Medication Sig Dispense Refill  . acetaminophen (TYLENOL) 650 MG CR tablet Take 1,300 mg by mouth 3 (three) times daily as needed for pain.     Marland Kitchen. allopurinol (ZYLOPRIM) 100 MG tablet Take 1 tablet (100 mg total) by mouth every morning. (Patient taking differently: Take 100 mg by mouth daily. ) 10 tablet 0  . feeding supplement, ENSURE COMPLETE, (ENSURE COMPLETE) LIQD Take 237 mLs by mouth 2 (two) times daily between meals. 60 Bottle 0  . furosemide (LASIX) 40 MG tablet Take 2 tablets (80 mg total) by mouth daily. 60 tablet 0  . methimazole (TAPAZOLE) 5 MG tablet Take 5 mg  by mouth daily.     . metoprolol tartrate (LOPRESSOR) 25 MG tablet Take 1 tablet (25 mg total) by mouth 2 (two) times daily. 60 tablet 0  . OXYGEN Inhale 2 L into the lungs continuous.    . pantoprazole (PROTONIX) 40 MG tablet Take 1 tablet (40 mg total) by mouth daily. 10 tablet 0  . senna (SENOKOT) 8.6 MG TABS tablet Take 1 tablet (8.6 mg total) by mouth daily. 120 each 0  . traMADol (ULTRAM) 50 MG tablet Take 50 mg by mouth 2 (two) times daily.     . traMADol (ULTRAM) 50 MG tablet Take 1 tablet (50 mg total) by mouth 2 (two) times daily. 30 tablet 0  . warfarin (COUMADIN) 1 MG tablet Take 3 mg by mouth daily at 6 PM.      No current facility-administered medications for this visit.     Allergies:   Patient has no known allergies.    Social History:  The patient  reports that she quit smoking about 8 months  ago. Her smoking use included Cigarettes. She has a 100.00 pack-year smoking history. She has never used smokeless tobacco. She reports that she does not drink alcohol or use drugs.   Family History:  The patient's family history is not on file.    ROS:  Please see the history of present illness. All other systems are reviewed and negative.    PHYSICAL EXAM: VS:  BP 115/70   Pulse 79   Ht 5\' 4"  (1.626 m)   Wt 103 lb 3.2 oz (46.8 kg)   BMI 17.71 kg/m  , BMI Body mass index is 17.71 kg/m. GEN: Well nourished, well developed, female in no acute distress  HEENT: normal for age  Neck: JVD 8 cm, no carotid bruit, no masses Cardiac: Irreg R&R; 2/6 murmur, no rubs, or gallops Respiratory: decreased BS bases bilaterally, normal work of breathing GI: soft, nontender, nondistended, + BS MS: no deformity or atrophy; trace pedal edema; distal pulses are 2+ in all 4 extremities   Skin: warm and dry, no rash; redness at IV site R wrist, discoloration R elbow, tiny abrasion w/ no drainage. Neuro:  Strength and sensation are intact Psych: euthymic mood, full affect   EKG:  EKG is ordered today. The ekg ordered today demonstrates Atrial fib, HR 79, QRS slightly widened. No change  ECHO: 06/2015 - Left ventricle: The cavity size was normal. There was mild   concentric hypertrophy. Systolic function was mildly reduced. The   estimated ejection fraction was in the range of 45% to 50%.   Diffuse hypokinesis. Doppler parameters are consistent with   restrictive physiology, indicative of decreased left ventricular   diastolic compliance and/or increased left atrial pressure.   Doppler parameters are consistent with elevated ventricular   end-diastolic filling pressure. - Aortic valve: There was trivial regurgitation. - Mitral valve: There was moderate regurgitation directed posteriorly. - Left atrium: The atrium was moderately dilated. - Right ventricle: Systolic function was mildly to moderately  reduced. - Right atrium: The atrium was moderately dilated. - Tricuspid valve: There was moderate regurgitation. - Pulmonary arteries: Systolic pressure was moderately increased.   PA peak pressure: 44 mm Hg (S). Impressions: - There is a change when compared to the prior study from   12/12/2013, LVEF has mildly decreased, now 45% with diffuse   hypokinesis. LVEF is most probably overestimated by significant MR.   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   There is moderate pulmonary hypertension.   RVEF is now mildly to moderately decreased.    Conclusively, this appears as worsening valvular cardiomyopathy.   Recent Labs: 11/08/2015: ALT 9; TSH 2.377 11/10/2015: B Natriuretic Peptide 447.8 11/12/2015: Magnesium 2.3 11/13/2015: BUN 98; Creatinine, Ser 3.97; Hemoglobin 9.0; Platelets 232; Potassium 3.8; Sodium 136    Lipid Panel No results found for: CHOL, TRIG, HDL, CHOLHDL, VLDL, LDLCALC, LDLDIRECT   Wt Readings from Last 3 Encounters:  11/21/15 103 lb 3.2 oz (46.8 kg)  11/13/15 99 lb 4.8 oz (45 kg)  10/17/15 106 lb 12.8 oz (48.4 kg)     Other studies Reviewed: Additional studies/ records that were reviewed today include: Office notes, hospital records and testing.  ASSESSMENT AND PLAN:  1.  Chronic systolic and diastolic CHF: her weight is stable, she is sleeping in a recliner, but that is chronic. Continue current Lasix dose, ck BMET today. Encourage any activity she can do.   2. R wrist pain, itching: ok to use OTC steroid cream for the itching. This may be just some irritation, or superficial phlebitis. However, she is very frail and the area is very red and tender. Will give her 7 days of Kefles, dose reduced due to her poor renal function.   3. R elbow pain: Advised the family to wrap the elbow loosely to pad it and maybe help her use it. Follow up  with Dr August Saucerean or primary MD.   4. Chronic anticoagulation: [facility] However, since she is here today, got it checked. It was 3.1. She was advised to cut her dose in half today only. No other changes, follow up next week as scheduled.  5. Atrial fib: rate is controlled, no sx   Current medicines are reviewed at length with the patient today.  The patient does not have concerns regarding medicines.  The following changes have been made:  no change  Labs/ tests ordered today include:   Orders Placed This Encounter  Procedures  . Basic Metabolic Panel (BMET)  . CBC  . EKG 12-Lead     Disposition:   FU with Dr Royann Shiversroitoru  Signed, Theodore DemarkBarrett, Farrah Skoda, PA-C  11/21/2015 4:46 PM    [facility] Phone: [phone]; Fax: [phone]  This note was written with the assistance of speech recognition software. Please excuse any transcriptional errors.

## 2015-11-21 NOTE — Patient Instructions (Addendum)
Medication Instructions:  1. START KEFLEX 250 MG EVERY 24 HOURS FOR 7 DAYS THEN STOP  2. CONTINUE ON ALL OTHER MEDICATIONS AS YOU ARE CURRENTLY TAKING    Labwork: TODAY BMET, CBC   Testing/Procedures: NONE  Follow-Up: 1. DR. Royann ShiversROITORU IN 1 MONTH  Any Other Special Instructions Will Be Listed Below (If Applicable). 1. WRAP RIGHT ELBOW LOOSELY FOR PADDING AND SUPPORT  2. PER RHONDA BARRETT, PA SHE WOULD LIKE FOR YOU TO FOLLOW WITH DR. DEAN (ORTHO) ABOUT YOUR RIGHT ELBOW  3. MONITOR WEIGHT DAILY  4. FOLLOW A LOW SODIUM DIET  Low-Sodium Eating Plan Sodium raises blood pressure and causes water to be held in the body. Getting less sodium from food will help lower your blood pressure, reduce any swelling, and protect your heart, liver, and kidneys. We get sodium by adding salt (sodium chloride) to food. Most of our sodium comes from canned, boxed, and frozen foods. Restaurant foods, fast foods, and pizza are also very high in sodium. Even if you take medicine to lower your blood pressure or to reduce fluid in your body, getting less sodium from your food is important. WHAT IS MY PLAN? Most people should limit their sodium intake to 2,300 mg a day. Your health care provider recommends that you limit your sodium intake to __________ a day.  WHAT DO I NEED TO KNOW ABOUT THIS EATING PLAN? For the low-sodium eating plan, you will follow these general guidelines:  Choose foods with a % Daily Value for sodium of less than 5% (as listed on the food label).   Use salt-free seasonings or herbs instead of table salt or sea salt.   Check with your health care provider or pharmacist before using salt substitutes.   Eat fresh foods.  Eat more vegetables and fruits.  Limit canned vegetables. If you do use them, rinse them well to decrease the sodium.   Limit cheese to 1 oz (28 g) per day.   Eat lower-sodium products, often labeled as "lower sodium" or "no salt added."  Avoid foods that  contain monosodium glutamate (MSG). MSG is sometimes added to [place] food and some canned foods.  Check food labels (Nutrition Facts labels) on foods to learn how much sodium is in one serving.  Eat more home-cooked food and less restaurant, buffet, and fast food.  When eating at a restaurant, ask that your food be prepared with less salt, or no salt if possible.  HOW DO I READ FOOD LABELS FOR SODIUM INFORMATION? The Nutrition Facts label lists the amount of sodium in one serving of the food. If you eat more than one serving, you must multiply the listed amount of sodium by the number of servings. Food labels may also identify foods as:  Sodium free--Less than 5 mg in a serving.  Very low sodium--35 mg or less in a serving.  Low sodium--140 mg or less in a serving.  Light in sodium--50% less sodium in a serving. For example, if a food that usually has 300 mg of sodium is changed to become light in sodium, it will have 150 mg of sodium.  Reduced sodium--25% less sodium in a serving. For example, if a food that usually has 400 mg of sodium is changed to reduced sodium, it will have 300 mg of sodium. WHAT FOODS CAN I EAT? Grains Low-sodium cereals, including oats, puffed wheat and rice, and shredded wheat cereals. Low-sodium crackers. Unsalted rice and pasta. Lower-sodium bread.  Vegetables Frozen or fresh vegetables. Low-sodium or  reduced-sodium canned vegetables. Low-sodium or reduced-sodium tomato sauce and paste. Low-sodium or reduced-sodium tomato and vegetable juices.  Fruits Fresh, frozen, and canned fruit. Fruit juice.  Meat and Other Protein Products Low-sodium canned tuna and salmon. Fresh or frozen meat, poultry, seafood, and fish. Lamb. Unsalted nuts. Dried beans, peas, and lentils without added salt. Unsalted canned beans. Homemade soups without salt. Eggs.  Dairy Milk. Soy milk. Ricotta cheese. Low-sodium or reduced-sodium cheeses. Yogurt.  Condiments Fresh and  dried herbs and spices. Salt-free seasonings. Onion and garlic powders. Low-sodium varieties of mustard and ketchup. Fresh or refrigerated horseradish. Lemon juice.  Fats and Oils Reduced-sodium salad dressings. Unsalted butter.  Other Unsalted popcorn and pretzels.  The items listed above may not be a complete list of recommended foods or beverages. Contact your dietitian for more options. WHAT FOODS ARE NOT RECOMMENDED? Grains Instant hot cereals. Bread stuffing, pancake, and biscuit mixes. Croutons. Seasoned rice or pasta mixes. Noodle soup cups. Boxed or frozen macaroni and cheese. Self-rising flour. Regular salted crackers. Vegetables Regular canned vegetables. Regular canned tomato sauce and paste. Regular tomato and vegetable juices. Frozen vegetables in sauces. Salted JamaicaFrench fries. Olives. Rosita FirePickles. Relishes. Sauerkraut. Salsa. Meat and Other Protein Products Salted, canned, smoked, spiced, or pickled meats, seafood, or fish. Bacon, ham, sausage, hot dogs, corned beef, chipped beef, and packaged luncheon meats. Salt pork. Jerky. Pickled herring. Anchovies, regular canned tuna, and sardines. Salted nuts. Dairy Processed cheese and cheese spreads. Cheese curds. Blue cheese and cottage cheese. Buttermilk.  Condiments Onion and garlic salt, seasoned salt, table salt, and sea salt. Canned and packaged gravies. Worcestershire sauce. Tartar sauce. Barbecue sauce. Teriyaki sauce. Soy sauce, including reduced sodium. Steak sauce. Fish sauce. Oyster sauce. Cocktail sauce. Horseradish that you find on the shelf. Regular ketchup and mustard. Meat flavorings and tenderizers. Bouillon cubes. Hot sauce. Tabasco sauce. Marinades. Taco seasonings. Relishes. Fats and Oils Regular salad dressings. Salted butter. Margarine. Ghee. Bacon fat.  Other Potato and tortilla chips. Corn chips and puffs. Salted popcorn and pretzels. Canned or dried soups. Pizza. Frozen entrees and pot pies.  The items  listed above may not be a complete list of foods and beverages to avoid. Contact your dietitian for more information.   This information is not intended to replace advice given to you by your health care provider. Make sure you discuss any questions you have with your health care provider.   Document Released: 06/19/2001 Document Revised: 01/18/2014 Document Reviewed: 11/01/2012 Elsevier Interactive Patient Education [employer] Inc.

## 2015-11-24 ENCOUNTER — Telehealth: Payer: Self-pay | Admitting: Physician Assistant

## 2015-11-24 NOTE — Telephone Encounter (Signed)
Re: Recent labwork  Returned call  Spoke to son regarding recommendation for oral K+. He notes she does not have a current Rx for this. She was prescribed this in hospital but she is not taking on discharge. Needs recommendation for dose, whether PRN or daily.  Also asking what she will need to do to improve her BUN & creatinine numbers as these are still elevated. Aware I will f/u w Bjorn Loserhonda and return call w advice.

## 2015-11-24 NOTE — Telephone Encounter (Signed)
New message ° ° ° ° °Returning a call to the nurse °

## 2015-11-24 NOTE — Progress Notes (Signed)
Thank you Calea Hribar 

## 2015-12-05 ENCOUNTER — Other Ambulatory Visit (INDEPENDENT_AMBULATORY_CARE_PROVIDER_SITE_OTHER): Payer: Self-pay | Admitting: Orthopaedic Surgery

## 2015-12-05 MED ORDER — TRAMADOL HCL 50 MG PO TABS: 50.0000 mg | ORAL_TABLET | Freq: Two times a day (BID) | ORAL | 2 refills | Status: AC

## 2015-12-10 DIAGNOSIS — J449 Chronic obstructive pulmonary disease, unspecified: Secondary | ICD-10-CM | POA: Diagnosis not present

## 2015-12-10 DIAGNOSIS — R269 Unspecified abnormalities of gait and mobility: Secondary | ICD-10-CM | POA: Diagnosis not present

## 2015-12-16 DIAGNOSIS — I509 Heart failure, unspecified: Secondary | ICD-10-CM | POA: Diagnosis not present

## 2015-12-16 DIAGNOSIS — J449 Chronic obstructive pulmonary disease, unspecified: Secondary | ICD-10-CM | POA: Diagnosis not present

## 2015-12-16 DIAGNOSIS — E049 Nontoxic goiter, unspecified: Secondary | ICD-10-CM | POA: Diagnosis not present

## 2015-12-16 DIAGNOSIS — I825Y9 Chronic embolism and thrombosis of unspecified deep veins of unspecified proximal lower extremity: Secondary | ICD-10-CM | POA: Diagnosis not present

## 2015-12-25 ENCOUNTER — Ambulatory Visit (INDEPENDENT_AMBULATORY_CARE_PROVIDER_SITE_OTHER): Payer: Medicare Other | Admitting: Cardiovascular Disease

## 2015-12-25 ENCOUNTER — Encounter: Payer: Self-pay | Admitting: Cardiovascular Disease

## 2015-12-25 VITALS — BP 103/65 | HR 76 | Ht 64.0 in | Wt 100.4 lb

## 2015-12-25 DIAGNOSIS — N189 Chronic kidney disease, unspecified: Secondary | ICD-10-CM

## 2015-12-25 DIAGNOSIS — I5042 Chronic combined systolic (congestive) and diastolic (congestive) heart failure: Secondary | ICD-10-CM | POA: Diagnosis not present

## 2015-12-25 DIAGNOSIS — J438 Other emphysema: Secondary | ICD-10-CM

## 2015-12-25 DIAGNOSIS — N184 Chronic kidney disease, stage 4 (severe): Secondary | ICD-10-CM

## 2015-12-25 DIAGNOSIS — D631 Anemia in chronic kidney disease: Secondary | ICD-10-CM

## 2015-12-25 DIAGNOSIS — I2510 Atherosclerotic heart disease of native coronary artery without angina pectoris: Secondary | ICD-10-CM | POA: Diagnosis not present

## 2015-12-25 DIAGNOSIS — E43 Unspecified severe protein-calorie malnutrition: Secondary | ICD-10-CM

## 2015-12-25 DIAGNOSIS — I4821 Permanent atrial fibrillation: Secondary | ICD-10-CM

## 2015-12-25 DIAGNOSIS — I10 Essential (primary) hypertension: Secondary | ICD-10-CM

## 2015-12-25 DIAGNOSIS — I482 Chronic atrial fibrillation: Secondary | ICD-10-CM

## 2015-12-25 MED ORDER — FUROSEMIDE 40 MG PO TABS: ORAL_TABLET | ORAL | 11 refills | Status: DC

## 2015-12-25 MED ORDER — POTASSIUM CHLORIDE CRYS ER 20 MEQ PO TBCR: 20.0000 meq | EXTENDED_RELEASE_TABLET | Freq: Every day | ORAL | 11 refills | Status: DC

## 2015-12-25 NOTE — Progress Notes (Signed)
Patient ID: [name] Alexander Bamberg, female   DOB: [birth year], 82 y.o.   MRN: [id number]    Cardiology Office Note    Date:  12/26/2015   ID:  [name] Alexander Moone, DOB [birth year], MRN [id number]  PCP:  Laurena SlimmerLARK,[name], MD  Cardiologist:   Thurmon FairMihai Deshondra Worst, MD   Chief Complaint  Patient presents with  . Follow-up    History of Present Illness:  [name] Alexander Shen is a 82 y.o. female with persistent atrial fibrillation, moderate mitral insufficiency, mildly depressed left ventricular systolic function (45%), combined systolic and diastolic heart failure, suspected CAD (coronary calcifications on CT, inferior wall motion abnormality), severe COPD on chronic home O2 (3 L/min), stage IV chronic kidney disease, history of bilateral lower extremity DVT on chronic warfarin anticoagulation, malnutrition/underweight, returning for follow-up.  She has functional class II-III dyspnea, but no longer needs to wear oxygen, continuously. She has minimal leg edema. She does not have chest pain. She does not have an active cough or sputum production. She has intermittent wheezing. As in the past she is unaware of palpitations although she is in atrial fibrillation with controlled rate at the time of this appointment.  Both her home scale and our office scale show weight of 100 pounds. She does not feel dizzy although her blood pressure is borderline low at 103/65. Treatment of heart failure has been difficult due to simultaneous problems with advanced renal insufficiency. Her creatinine hovers around 3.0 usually and was up to 4.0 on November 10.  Past Medical History:  Diagnosis Date  . Arthritis    "hips, knees, shoulders" (07/31/2015)  . Chronic diastolic CHF (congestive heart failure), NYHA class 2 (HCC)   . CKD (chronic kidney disease), stage IV (HCC)   . COPD (chronic obstructive pulmonary disease) (HCC)   . DVT (deep venous thrombosis) (HCC) "2-3 times"  . GERD (gastroesophageal reflux disease)   . Gout   .  Hypertension   . Hyperthyroidism   . On home oxygen therapy    "2L; 24/7" (07/31/2015)  . Pneumonia   . Thyroid disease     Past Surgical History:  Procedure Laterality Date  . CATARACT EXTRACTION W/ INTRAOCULAR LENS  IMPLANT, BILATERAL Bilateral   . JOINT REPLACEMENT    . TOTAL KNEE ARTHROPLASTY Right 06/2004   [name]/notes 05/26/2010  . VAGINAL HYSTERECTOMY      Current Medications: Outpatient Medications Prior to Visit  Medication Sig Dispense Refill  . acetaminophen (TYLENOL) 650 MG CR tablet Take 1,300 mg by mouth 3 (three) times daily as needed for pain.     Marland Kitchen. allopurinol (ZYLOPRIM) 100 MG tablet Take 1 tablet (100 mg total) by mouth every morning. (Patient taking differently: Take 100 mg by mouth daily. ) 10 tablet 0  . feeding supplement, ENSURE COMPLETE, (ENSURE COMPLETE) LIQD Take 237 mLs by mouth 2 (two) times daily between meals. 60 Bottle 0  . methimazole (TAPAZOLE) 5 MG tablet Take 5 mg by mouth daily.     . metoprolol tartrate (LOPRESSOR) 25 MG tablet Take 1 tablet (25 mg total) by mouth 2 (two) times daily. 60 tablet 0  . OXYGEN Inhale 2 L into the lungs continuous.    . pantoprazole (PROTONIX) 40 MG tablet Take 1 tablet (40 mg total) by mouth daily. 10 tablet 0  . senna (SENOKOT) 8.6 MG TABS tablet Take 1 tablet (8.6 mg total) by mouth daily. 120 each 0  . traMADol (ULTRAM) 50 MG tablet Take 50 mg by mouth 2 (two) times daily.     .Marland Kitchen  traMADol (ULTRAM) 50 MG tablet Take 1 tablet (50 mg total) by mouth 2 (two) times daily. 60 tablet 2  . warfarin (COUMADIN) 1 MG tablet Take 3 mg by mouth daily at 6 PM.     . furosemide (LASIX) 40 MG tablet Take 2 tablets (80 mg total) by mouth daily. 60 tablet 0  . cephALEXin (KEFLEX) 250 MG capsule Take 1 capsule (250 mg total) by mouth daily. (Patient not taking: Reported on 12/25/2015) 7 capsule 0   No facility-administered medications prior to visit.      Allergies:   Patient has no known allergies.   Social History   Social History    . Marital status: Widowed    Spouse name: N/A  . Number of children: N/A  . Years of education: N/A   Occupational History  . Retired    Social History Main Topics  . Smoking status: Former Smoker    Packs/day: 2.00    Years: 50.00    Types: Cigarettes    Quit date: 03/12/2015  . Smokeless tobacco: Never Used  . Alcohol use No  . Drug use: No  . Sexual activity: Yes   Other Topics Concern  . None   Social History Narrative   Good family support.     Family History:  The patient reports that he does not think there is any family history of coronary disease or stroke or arrhythmia. She has lost 2 children, neither one of them to heart disease..  ROS:   Please see the history of present illness.    ROS All other systems reviewed and are negative.   PHYSICAL EXAM:   VS:  BP 103/65 (BP Location: Left Arm, Patient Position: Sitting, Cuff Size: Normal)   Pulse 76   Ht 5\' 4"  (1.626 m)   Wt 100 lb 6.4 oz (45.5 kg)   SpO2 99%   BMI 17.23 kg/m    GEN: Well nourished, well developed, in no acute distress . Wearing oxygen by nasal cannula HEENT: normal  Neck: JVD 7-8 cm, no carotid bruits, or masses Cardiac: irregular; apical and left lower sternal border 2/6 holosystolic murmur, rubs, or gallops, 1+ pedal edema Respiratory:  Severely diminished breath sounds bilaterally, but otherwise clear to auscultation bilaterally, normal work of breathing GI: soft, nontender, nondistended, + BS MS: no deformity or atrophy  Skin: warm and dry, no rash Neuro:  Alert and Oriented x 3, Strength and sensation are intact Psych: euthymic mood, full affect  Wt Readings from Last 3 Encounters:  12/25/15 100 lb 6.4 oz (45.5 kg)  11/21/15 103 lb 3.2 oz (46.8 kg)  11/13/15 99 lb 4.8 oz (45 kg)      Studies/Labs Reviewed:   EKG:  EKG is not ordered today.    Recent Labs: 11/08/2015: ALT 9; TSH 2.377 11/10/2015: Alexander Natriuretic Peptide 447.8 11/12/2015: Magnesium 2.3 11/21/2015: BUN 95;  Creat 4.06; Hemoglobin 9.7; Platelets 311; Potassium 3.2; Sodium 137    ASSESSMENT:    1. Chronic combined systolic and diastolic CHF (congestive heart failure) (HCC)   2. Permanent atrial fibrillation (HCC)   3. Atherosclerosis of native coronary artery of native heart without angina pectoris   4. Other emphysema (HCC)   5. Essential hypertension   6. CKD (chronic kidney disease), stage IV (HCC)   7. Anemia associated with chronic renal failure   8. Protein-calorie malnutrition, severe      PLAN:  In order of problems listed above:  1. CHF: She has evidence for  some residual fluid overload with moderately elevated JVP and very mild pedal edema. On the other hand, creatinine has worsened. Since he does not have much shortness of breath, I think this is probably the best compromise on her volume status. She has good rate control and only mildly depressed left ventricular systolic function. Would maintain on current diuretic dose try to keep her weight under 101 pounds. 2. Atrial fibrillation: Adequate rate control. She is appropriately anticoagulated, with anticoagulation followed by her primary care physician.  3. CAD: Coronary artery disease is suspected based on the presence of coronary artery calcifications on CT chest, extensive evidence of peripheral arterial disease by imaging studies, suggestion of old inferior myocardial infarction by EKG changes and presence of wall motion abnormalities. She is not a good candidate for either surgical or percutaneous revascularization due to her lung renal problems. She does not have angina pectoris. Medical management as prescribed. 4. COPD: No longer smoking. Avoid long-acting bronchodilators such as Symbicort. OK to use short acting albuterol "prn" as rescue inhaler.  5. HTN: Blood pressure is low on metoprolol, necessary for ventricular rate control 6. CKD stage IV: She is not a great candidate for invasive coronary angiography due to the risk of  renal failure. She would not be a revascularization candidate anyway. Avoid iodine contrast based procedures. On the other hand, I would focus on palliation for shortness of breath rather than her renal function and will not hesitate to recommend higher doses of diuretics if needed for symptom relief. 7. Anemia is explained by renal insufficiency at least in part 8. She is still underweight and has recently experienced a lot of fluctuation in her weight.     Medication Adjustments/Labs and Tests Ordered: Current medicines are reviewed at length with the patient today.  Concerns regarding medicines are outlined above.  Medication changes, Labs and Tests ordered today are listed in the Patient Instructions below. Patient Instructions  TARGET WEIGHT ON HOME SCALE = 100 pounds.  Weigh daily.  On days you weigh 101 pounds or more: 1. TAKE Furosemide 80 mg TWICE a day 2. TAKE Potassium 20 mEq only when taking increased Furosemide  Dr Royann Shivers recommends that you schedule a follow-up appointment in 3 months.  If you need a refill on your cardiac medications before your next appointment, please call your pharmacy.    Signed, Thurmon Fair, MD  12/26/2015 1:16 PM    [facility] [address] [place], [place], [place]  [address] Phone: [phone]; Fax: [phone]

## 2015-12-25 NOTE — Patient Instructions (Signed)
TARGET WEIGHT ON HOME SCALE = 100 pounds.  Weigh daily.  On days you weigh 101 pounds or more: 1. TAKE Furosemide 80 mg TWICE a day 2. TAKE Potassium 20 mEq only when taking increased Furosemide  Dr Royann Shiversroitoru recommends that you schedule a follow-up appointment in 3 months.  If you need a refill on your cardiac medications before your next appointment, please call your pharmacy.

## 2016-01-09 ENCOUNTER — Other Ambulatory Visit: Payer: Self-pay | Admitting: Cardiovascular Disease

## 2016-01-09 DIAGNOSIS — R269 Unspecified abnormalities of gait and mobility: Secondary | ICD-10-CM | POA: Diagnosis not present

## 2016-01-09 DIAGNOSIS — J449 Chronic obstructive pulmonary disease, unspecified: Secondary | ICD-10-CM | POA: Diagnosis not present

## 2016-02-02 ENCOUNTER — Encounter (HOSPITAL_BASED_OUTPATIENT_CLINIC_OR_DEPARTMENT_OTHER): Payer: Self-pay | Admitting: *Deleted

## 2016-02-02 ENCOUNTER — Emergency Department (HOSPITAL_BASED_OUTPATIENT_CLINIC_OR_DEPARTMENT_OTHER)
Admission: EM | Admit: 2016-02-02 | Discharge: 2016-02-02 | Disposition: A | Discharge status: Home / Self Care | Payer: Medicare Other | Attending: Emergency Medicine | Admitting: Emergency Medicine

## 2016-02-02 DIAGNOSIS — I5042 Chronic combined systolic (congestive) and diastolic (congestive) heart failure: Secondary | ICD-10-CM | POA: Diagnosis not present

## 2016-02-02 DIAGNOSIS — Z87891 Personal history of nicotine dependence: Secondary | ICD-10-CM | POA: Insufficient documentation

## 2016-02-02 DIAGNOSIS — I130 Hypertensive heart and chronic kidney disease with heart failure and stage 1 through stage 4 chronic kidney disease, or unspecified chronic kidney disease: Secondary | ICD-10-CM | POA: Insufficient documentation

## 2016-02-02 DIAGNOSIS — R040 Epistaxis: Secondary | ICD-10-CM | POA: Insufficient documentation

## 2016-02-02 DIAGNOSIS — J449 Chronic obstructive pulmonary disease, unspecified: Secondary | ICD-10-CM | POA: Insufficient documentation

## 2016-02-02 DIAGNOSIS — N184 Chronic kidney disease, stage 4 (severe): Secondary | ICD-10-CM | POA: Insufficient documentation

## 2016-02-02 DIAGNOSIS — Z7901 Long term (current) use of anticoagulants: Secondary | ICD-10-CM | POA: Diagnosis not present

## 2016-02-02 DIAGNOSIS — J9611 Chronic respiratory failure with hypoxia: Secondary | ICD-10-CM | POA: Diagnosis not present

## 2016-02-02 LAB — CBC
HEMATOCRIT: 27 % — AB (ref 36.0–46.0)
HEMOGLOBIN: 8.9 g/dL — AB (ref 12.0–15.0)
MCH: 31.4 pg (ref 26.0–34.0)
MCHC: 33 g/dL (ref 30.0–36.0)
MCV: 95.4 fL (ref 78.0–100.0)
Platelets: 208 10*3/uL (ref 150–400)
RBC: 2.83 MIL/uL — ABNORMAL LOW (ref 3.87–5.11)
RDW: 16 % — AB (ref 11.5–15.5)
WBC: 3.3 10*3/uL — ABNORMAL LOW (ref 4.0–10.5)

## 2016-02-02 LAB — PROTIME-INR
INR: 1.19
Prothrombin Time: 15.2 seconds (ref 11.4–15.2)

## 2016-02-02 MED ORDER — LIDOCAINE HCL 4 % EX SOLN
Freq: Once | CUTANEOUS | Status: AC
Start: 2016-02-02 — End: 2016-02-02
  Administered 2016-02-02: 11:00:00 via TOPICAL
  Filled 2016-02-02: qty 50

## 2016-02-02 MED ORDER — OXYMETAZOLINE HCL 0.05 % NA SOLN
1.0000 | Freq: Once | NASAL | Status: AC
  Administered 2016-02-02: 1 via NASAL
  Filled 2016-02-02: qty 15

## 2016-02-02 NOTE — ED Triage Notes (Signed)
Pt to room 5 by ems, pt is a/a/ox4, reporting epitaxis onset this am, small amt bleeding noted from right nares. Nasal clamp placed, pt denies any pain, denies any injury, states "it just started bleeding all of the sudden."

## 2016-02-02 NOTE — ED Notes (Signed)
ED Provider at bedside, supplies gathered.

## 2016-02-02 NOTE — ED Provider Notes (Signed)
[facility] Provider Note   CSN: [id number] Arrival date & time: 02/02/16  1036     History   Chief Complaint Chief Complaint  Patient presents with  . Epistaxis    HPI [name] Alexander is a 83 y.o. female.  HPI Patient with right-sided nosebleed. Began this morning. Has come on right nostril and patient denies a going down her throat but family member states she has been spitting out blood. No lightheadedness dizziness. She is on oxygen. She also is on Coumadin. No trauma to the nose. No other bleeding.   Past Medical History:  Diagnosis Date  . Arthritis    "hips, knees, shoulders" (07/31/2015)  . Chronic diastolic CHF (congestive heart failure), NYHA class 2 (HCC)   . CKD (chronic kidney disease), stage IV (HCC)   . COPD (chronic obstructive pulmonary disease) (HCC)   . DVT (deep venous thrombosis) (HCC) "2-3 times"  . GERD (gastroesophageal reflux disease)   . Gout   . Hypertension   . Hyperthyroidism   . On home oxygen therapy    "2L; 24/7" (07/31/2015)  . Pneumonia   . Thyroid disease     Patient Active Problem List   Diagnosis Date Noted  . Anemia associated with chronic renal failure 12/26/2015  . Acute on chronic renal failure (HCC)   . Acute renal failure (ARF) (HCC) 11/08/2015  . Acute renal failure superimposed on stage 4 chronic kidney disease (HCC) 11/08/2015  . Chronic respiratory failure with hypoxia (HCC) 11/08/2015  . Hypokalemia   . Left hip pain   . Chest pain   . Coronary atherosclerosis 10/18/2015  . Goals of care, counseling/discussion   . Palliative care encounter   . Permanent atrial fibrillation (HCC)   . COPD (chronic obstructive pulmonary disease) (HCC) 05/17/2015  . Chronic combined systolic and diastolic CHF (congestive heart failure) (HCC) 05/17/2015  . Protein-calorie malnutrition, severe 03/20/2015  . Dyspnea 03/18/2015  . CKD (chronic kidney disease), stage IV (HCC) 03/18/2015  . Tobacco abuse 03/18/2015  .  Normocytic anemia 12/12/2013  . Edema 12/12/2013  . Community acquired pneumonia 06/11/2011  . Hyperthyroidism 06/11/2011  . Glaucoma 06/11/2011  . DVT (deep venous thrombosis) (HCC) 06/11/2011  . Hypertension   . Gout     Past Surgical History:  Procedure Laterality Date  . CATARACT EXTRACTION W/ INTRAOCULAR LENS  IMPLANT, BILATERAL Bilateral   . JOINT REPLACEMENT    . TOTAL KNEE ARTHROPLASTY Right 06/2004   [name] 05/26/2010  . VAGINAL HYSTERECTOMY      OB History    No data available       Home Medications    Prior to Admission medications   Medication Sig Start Date End Date Taking? Authorizing Provider  acetaminophen (TYLENOL) 650 MG CR tablet Take 1,300 mg by mouth 3 (three) times daily as needed for pain.     Historical Provider, MD  allopurinol (ZYLOPRIM) 100 MG tablet Take 1 tablet (100 mg total) by mouth every morning. Patient taking differently: Take 100 mg by mouth daily.  07/10/15   Jonah Blue, MD  diltiazem (DILACOR XR) 120 MG 24 hr capsule Take 1 capsule (120 mg total) by mouth 2 (two) times daily. 01/09/16   Mihai Croitoru, MD  feeding supplement, ENSURE COMPLETE, (ENSURE COMPLETE) LIQD Take 237 mLs by mouth 2 (two) times daily between meals. 12/18/13   Nishant Dhungel, MD  furosemide (LASIX) 40 MG tablet Take 2-4 tablets (80-160 mg total) by mouth daily as directed. 12/25/15   Mihai Croitoru,  MD  methimazole (TAPAZOLE) 5 MG tablet Take 5 mg by mouth daily.     Historical Provider, MD  metoprolol tartrate (LOPRESSOR) 25 MG tablet Take 1 tablet (25 mg total) by mouth 2 (two) times daily. 11/13/15   Elease Etienne, MD  OXYGEN Inhale 2 L into the lungs continuous.    Historical Provider, MD  pantoprazole (PROTONIX) 40 MG tablet Take 1 tablet (40 mg total) by mouth daily. 07/10/15   Jonah Blue, MD  potassium chloride SA (KLOR-CON M20) 20 MEQ tablet Take 1 tablet (20 mEq total) by mouth daily. Take 1 tablet (20 mg total) by mouth daily as directed. 12/25/15  03/24/16  Mihai Croitoru, MD  senna (SENOKOT) 8.6 MG TABS tablet Take 1 tablet (8.6 mg total) by mouth daily. 07/10/15   Jonah Blue, MD  traMADol (ULTRAM) 50 MG tablet Take 50 mg by mouth 2 (two) times daily.  10/31/12   Historical Provider, MD  warfarin (COUMADIN) 1 MG tablet Take 3 mg by mouth daily at 6 PM.  09/15/15   Historical Provider, MD    Family History Family History  Problem Relation Age of Onset  . Stroke Neg Hx   . CAD Neg Hx     Social History Social History  Substance Use Topics  . Smoking status: Former Smoker    Packs/day: 2.00    Years: 50.00    Types: Cigarettes    Quit date: 03/12/2015  . Smokeless tobacco: Never Used  . Alcohol use No     Allergies   Patient has no known allergies.   Review of Systems Review of Systems  Constitutional: Negative for appetite change and fatigue.  HENT: Positive for nosebleeds.   Respiratory: Negative for cough.   Cardiovascular: Negative for chest pain.  Gastrointestinal: Negative for abdominal pain.  Genitourinary: Negative for flank pain.  Musculoskeletal: Negative for back pain.  Neurological: Negative for tremors and light-headedness.  Hematological: Bruises/bleeds easily.     Physical Exam Updated Vital Signs BP 121/73 (BP Location: Right Arm)   Pulse 89   Resp 18   SpO2 100%   Physical Exam  Constitutional: She appears well-developed.  HENT:  Head: Atraumatic.  Some dried blood with some active bleeding in the right nostril anteriorly. Unable to visualize posterior pharynx.  Eyes: Pupils are equal, round, and reactive to light.  Cardiovascular: Normal rate.   Pulmonary/Chest: Effort normal.  Abdominal: There is no tenderness.  Neurological: She is alert.  Skin: Skin is warm. Capillary refill takes less than 2 seconds.     ED Treatments / Results  Labs (all labs ordered are listed, but only abnormal results are displayed) Labs Reviewed  CBC - Abnormal; Notable for the following:       Result  Value   WBC 3.3 (*)    RBC 2.83 (*)    Hemoglobin 8.9 (*)    HCT 27.0 (*)    RDW 16.0 (*)    All other components within normal limits  PROTIME-INR    EKG  EKG Interpretation None       Radiology No results found.  Procedures Procedures (including critical care time)  Medications Ordered in ED Medications  oxymetazoline (AFRIN) 0.05 % nasal spray 1 spray (1 spray Each Nare Given 02/02/16 1059)  lidocaine (XYLOCAINE) 4 % external solution ( Topical Given 02/02/16 1110)     Initial Impression / Assessment and Plan / ED Course  I have reviewed the triage vital signs and the nursing notes.  Pertinent  labs & imaging results that were available during my care of the patient were reviewed by me and considered in my medical decision making (see chart for details).     Patient with right-sided nosebleed. Had area of active bleeding that was cauterized however had slight bleeding after this. Discussed with patient and family. Bleeding was most controlled and was not dripping. We discussed and elected against packing at this time. Will hold oxygen to the other nostril for now. Will follow-up with ENT as needed. Will  Return if needed for for further bleeding.Hemoglobin is slightly decreased only to be followed.  Final Clinical Impressions(s) / ED Diagnoses   Final diagnoses:  Right-sided epistaxis    New Prescriptions Discharge Medication List as of 02/02/2016  2:02 PM       Benjiman CoreNathan Hernandez [place], MD 02/02/16 [phone]

## 2016-03-04 ENCOUNTER — Other Ambulatory Visit (INDEPENDENT_AMBULATORY_CARE_PROVIDER_SITE_OTHER): Payer: Self-pay | Admitting: Orthopaedic Surgery

## 2016-03-05 NOTE — Telephone Encounter (Signed)
Pt son calling for refill of tramadol for pt

## 2016-03-08 NOTE — Telephone Encounter (Signed)
Rx ready for pick up at the front desk. LMOM 

## 2016-03-08 NOTE — Telephone Encounter (Signed)
Please advise 

## 2016-03-30 ENCOUNTER — Encounter: Payer: Self-pay | Admitting: Cardiovascular Disease

## 2016-03-30 ENCOUNTER — Ambulatory Visit (INDEPENDENT_AMBULATORY_CARE_PROVIDER_SITE_OTHER): Payer: Medicare Other | Admitting: Cardiovascular Disease

## 2016-03-30 VITALS — BP 112/56 | HR 73 | Ht 64.0 in | Wt 113.0 lb

## 2016-03-30 DIAGNOSIS — N184 Chronic kidney disease, stage 4 (severe): Secondary | ICD-10-CM

## 2016-03-30 DIAGNOSIS — E43 Unspecified severe protein-calorie malnutrition: Secondary | ICD-10-CM

## 2016-03-30 DIAGNOSIS — I4821 Permanent atrial fibrillation: Secondary | ICD-10-CM

## 2016-03-30 DIAGNOSIS — I2510 Atherosclerotic heart disease of native coronary artery without angina pectoris: Secondary | ICD-10-CM

## 2016-03-30 DIAGNOSIS — J9611 Chronic respiratory failure with hypoxia: Secondary | ICD-10-CM

## 2016-03-30 DIAGNOSIS — I5043 Acute on chronic combined systolic (congestive) and diastolic (congestive) heart failure: Secondary | ICD-10-CM

## 2016-03-30 DIAGNOSIS — D631 Anemia in chronic kidney disease: Secondary | ICD-10-CM

## 2016-03-30 DIAGNOSIS — I482 Chronic atrial fibrillation: Secondary | ICD-10-CM

## 2016-03-30 DIAGNOSIS — N189 Chronic kidney disease, unspecified: Secondary | ICD-10-CM

## 2016-03-30 DIAGNOSIS — I10 Essential (primary) hypertension: Secondary | ICD-10-CM

## 2016-03-30 MED ORDER — METOLAZONE 2.5 MG PO TABS: 2.5000 mg | ORAL_TABLET | ORAL | 0 refills | Status: DC

## 2016-03-30 NOTE — Progress Notes (Signed)
Patient ID: [name] Alexander Shough, female   DOB: Aug 22, 1933, 83 y.o.   MRN: [id number]    Cardiology Office Note    Date:  04/03/2016   ID:  [name] Alexander Tat, DOB Aug 22, 1933, MRN [id number]  PCP:  Laurena SlimmerLARK,[name], MD  Cardiologist:   Thurmon FairMihai Damali Broadfoot, MD   No chief complaint on file.   History of Present Illness:  [name] Alexander Below is a 83 y.o. female with persistent atrial fibrillation, moderate mitral insufficiency, mildly depressed left ventricular systolic function (45%), combined systolic and diastolic heart failure, suspected CAD (coronary calcifications on CT, inferior wall motion abnormality), severe COPD on chronic home O2 (3 L/min), stage IV chronic kidney disease, history of bilateral lower extremity DVT on chronic warfarin anticoagulation, malnutrition/underweight, returning for follow-up.  She has functional class III dyspnea, worse than her baseline, moderate leg edema. She is now wearing her oxygen all the time wise before she would use it only intermittently. She does not have chest pain. She denies cough, but has intermittent wheezing. She is unaware of palpitations, although she is in atrial fibrillation with controlled rate at the time of this appointment.  Both her home scale and our office scale show weight of 113 pounds, well above what she weighed at her last appointment. Previously estimated her dry weight to be around 100 pounds. It's possible she is getting some real weight since her appetite has improved. She does not feel dizzy and she has not had syncope. Treatment of heart failure has been difficult due to simultaneous problems with advanced renal insufficiency. Her creatinine hovers around 3.0 usually and was up to 4.0 on November 10. She has moderate anemia  Past Medical History:  Diagnosis Date  . Arthritis    "hips, knees, shoulders" (07/31/2015)  . Chronic diastolic CHF (congestive heart failure), NYHA class 2 (HCC)   . CKD (chronic kidney disease), stage IV (HCC)   . COPD  (chronic obstructive pulmonary disease) (HCC)   . DVT (deep venous thrombosis) (HCC) "2-3 times"  . GERD (gastroesophageal reflux disease)   . Gout   . Hypertension   . Hyperthyroidism   . On home oxygen therapy    "2L; 24/7" (07/31/2015)  . Pneumonia   . Thyroid disease     Past Surgical History:  Procedure Laterality Date  . CATARACT EXTRACTION W/ INTRAOCULAR LENS  IMPLANT, BILATERAL Bilateral   . JOINT REPLACEMENT    . TOTAL KNEE ARTHROPLASTY Right 06/2004   [name]/notes 05/26/2010  . VAGINAL HYSTERECTOMY      Current Medications: Outpatient Medications Prior to Visit  Medication Sig Dispense Refill  . acetaminophen (TYLENOL) 650 MG CR tablet Take 1,300 mg by mouth 3 (three) times daily as needed for pain.     Marland Kitchen. allopurinol (ZYLOPRIM) 100 MG tablet Take 1 tablet (100 mg total) by mouth every morning. (Patient taking differently: Take 100 mg by mouth daily. ) 10 tablet 0  . feeding supplement, ENSURE COMPLETE, (ENSURE COMPLETE) LIQD Take 237 mLs by mouth 2 (two) times daily between meals. 60 Bottle 0  . furosemide (LASIX) 40 MG tablet Take 2-4 tablets (80-160 mg total) by mouth daily as directed. 120 tablet 11  . metoprolol tartrate (LOPRESSOR) 25 MG tablet Take 1 tablet (25 mg total) by mouth 2 (two) times daily. 60 tablet 0  . OXYGEN Inhale 2 L into the lungs continuous.    . pantoprazole (PROTONIX) 40 MG tablet Take 1 tablet (40 mg total) by mouth daily. 10 tablet 0  . senna (SENOKOT)  8.6 MG TABS tablet Take 1 tablet (8.6 mg total) by mouth daily. 120 each 0  . traMADol (ULTRAM) 50 MG tablet TAKE 1 TABLET BY MOUTH TWICE A DAY AS NEEDED FOR PAIN 60 tablet 2  . warfarin (COUMADIN) 1 MG tablet Take 3 mg by mouth daily at 6 PM.     . potassium chloride SA (KLOR-CON M20) 20 MEQ tablet Take 1 tablet (20 mEq total) by mouth daily. Take 1 tablet (20 mg total) by mouth daily as directed. 30 tablet 11  . diltiazem (DILACOR XR) 120 MG 24 hr capsule Take 1 capsule (120 mg total) by mouth 2 (two)  times daily. (Patient not taking: Reported on 03/30/2016) 60 capsule 6  . methimazole (TAPAZOLE) 5 MG tablet Take 5 mg by mouth daily.      No facility-administered medications prior to visit.      Allergies:   Patient has no known allergies.   Social History   Social History  . Marital status: Widowed    Spouse name: N/A  . Number of children: N/A  . Years of education: N/A   Occupational History  . Retired    Social History Main Topics  . Smoking status: Former Smoker    Packs/day: 2.00    Years: 50.00    Types: Cigarettes    Quit date: 03/12/2015  . Smokeless tobacco: Never Used  . Alcohol use No  . Drug use: No  . Sexual activity: Yes   Other Topics Concern  . None   Social History Narrative   Good family support.     Family History:  The patient reports that he does not think there is any family history of coronary disease or stroke or arrhythmia. She has lost 2 children, neither one of them to heart disease..  ROS:   Please see the history of present illness.    ROS All other systems reviewed and are negative.   PHYSICAL EXAM:   VS:  BP (!) 112/56   Pulse 73   Ht 5\' 4"  (1.626 m)   Wt 51.3 kg (113 lb)   BMI 19.40 kg/m    GEN: Well nourished, well developed, in no acute distress . Wearing oxygen by nasal cannula HEENT: normal  Neck: JVD 9-10 cm, no carotid bruits, goiter or masses Cardiac: irregular; apical and left lower sternal border 2/6 holosystolic murmur, rubs, or gallops, 2-3+ pedal edema with swelling almost to the knees Respiratory:  Severely diminished breath sounds bilaterally, but otherwise clear to auscultation bilaterally, normal work of breathing GI: soft, nontender, nondistended, + BS MS: no deformity or atrophy  Skin: warm and dry, no rash Neuro:  Alert and Oriented x 3, Strength and sensation are intact Psych: euthymic mood, full affect  Wt Readings from Last 3 Encounters:  03/30/16 51.3 kg (113 lb)  12/25/15 45.5 kg (100 lb 6.4 oz)    11/21/15 46.8 kg (103 lb 3.2 oz)      Studies/Labs Reviewed:   EKG:  EKG is ordered today.  It shows atrial fibrillation, generalized low voltage and poor R-wave progression, QTC 471 ms  Recent Labs: 11/08/2015: ALT 9; TSH 2.377 11/10/2015: Alexander Natriuretic Peptide 447.8 11/12/2015: Magnesium 2.3 11/21/2015: BUN 95; Creat 4.06; Potassium 3.2; Sodium 137 02/02/2016: Hemoglobin 8.9; Platelets 208    ASSESSMENT:    1. Chronic combined systolic and diastolic CHF (congestive heart failure) (HCC)   2. Anemia associated with chronic renal failure      PLAN:  In order of problems  listed above:  1. CHF: She has evidence for worsened fluid overload with markedly elevated JVP and more pedal edema, increased shortness of breath. Weight is up 13 pounds from last appointment. Will add metolazone intermittently. Hopefully will not need intravenous diuretics or hospitalization. . Would adjust thet diuretic dose try to keep her weight under 101 pounds. 2. Atrial fibrillation: Adequate rate control. She is appropriately anticoagulated, with anticoagulation followed by her primary care physician.  3. CAD: Coronary artery disease is suspected based on the presence of coronary artery calcifications on CT chest, extensive evidence of peripheral arterial disease by imaging studies, suggestion of old inferior myocardial infarction by EKG changes and presence of wall motion abnormalities. She is not a good candidate for either surgical or percutaneous revascularization due to her lung renal problems. She does not have angina pectoris. Medical management as prescribed. 4. COPD: No longer smoking. Avoid long-acting bronchodilators such as Symbicort. OK to use short acting albuterol "prn" as rescue inhaler.  5. HTN: Blood pressure is fair,  Metoprolol is necessary for ventricular rate control 6. CKD stage IV: She is not a great candidate for invasive coronary angiography due to the risk of renal failure. She would  not be a revascularization candidate anyway. Avoid iodine contrast based procedures. On the other hand, I would focus on palliation for shortness of breath rather than her renal function and will not hesitate to recommend higher doses of diuretics if needed for symptom relief. 7. Anemia is explained by renal insufficiency (at least in part). 8. She is still underweight even with current fluid overload.     Medication Adjustments/Labs and Tests Ordered: Current medicines are reviewed at length with the patient today.  Concerns regarding medicines are outlined above.  Medication changes, Labs and Tests ordered today are listed in the Patient Instructions below. Patient Instructions  Medication Instructions: Dr Royann Shivers has recommended making the following medication changes: 1. TAKE Metolazone 2.5 mg - take 1 tablet by mouth tomorrow 30 minutes before your furosemide  Labwork: Your physician recommends that you return for lab work on Thursday.  Testing/Procedures: NONE ORDERED  Follow-up: Your physician recommends that you schedule a follow-up appointment in 2 weeks with a NP/PA.  Dr Royann Shivers recommends that you schedule a follow-up appointment in first available. I will call you to schedule this appointment.  If you need a refill on your cardiac medications before your next appointment, please call your pharmacy.    Signed, Thurmon Fair, MD  04/03/2016 2:02 PM    [facility] [address] [place], [place], [place]  [address] Phone: [phone]; Fax: [phone]

## 2016-03-30 NOTE — Patient Instructions (Signed)
Medication Instructions: Dr Royann Shiversroitoru has recommended making the following medication changes: 1. TAKE Metolazone 2.5 mg - take 1 tablet by mouth tomorrow 30 minutes before your furosemide  Labwork: Your physician recommends that you return for lab work on Thursday.  Testing/Procedures: NONE ORDERED  Follow-up: Your physician recommends that you schedule a follow-up appointment in 2 weeks with a NP/PA.  Dr Royann Shiversroitoru recommends that you schedule a follow-up appointment in first available. I will call you to schedule this appointment.  If you need a refill on your cardiac medications before your next appointment, please call your pharmacy.

## 2016-04-07 ENCOUNTER — Telehealth: Payer: Self-pay

## 2016-04-07 NOTE — Telephone Encounter (Signed)
lmtcb

## 2016-04-07 NOTE — Telephone Encounter (Signed)
-----   Message from Thurmon FairMihai Croitoru, MD sent at 04/03/2016  2:08 PM EDT ----- Flexion never had her repeat labs drawn on Thursday. Please touch base with her on Monday to find out if she is breathing easier, what her weight is and instruct her to have the labs drawn please. MCr

## 2016-04-13 ENCOUNTER — Encounter: Payer: Self-pay | Admitting: Cardiology

## 2016-04-13 ENCOUNTER — Ambulatory Visit (INDEPENDENT_AMBULATORY_CARE_PROVIDER_SITE_OTHER): Payer: Medicare Other | Admitting: Cardiology

## 2016-04-13 DIAGNOSIS — D631 Anemia in chronic kidney disease: Secondary | ICD-10-CM | POA: Diagnosis not present

## 2016-04-13 DIAGNOSIS — I4821 Permanent atrial fibrillation: Secondary | ICD-10-CM

## 2016-04-13 DIAGNOSIS — N189 Chronic kidney disease, unspecified: Secondary | ICD-10-CM

## 2016-04-13 DIAGNOSIS — N186 End stage renal disease: Secondary | ICD-10-CM | POA: Diagnosis not present

## 2016-04-13 DIAGNOSIS — Z7901 Long term (current) use of anticoagulants: Secondary | ICD-10-CM | POA: Insufficient documentation

## 2016-04-13 DIAGNOSIS — I482 Chronic atrial fibrillation: Secondary | ICD-10-CM

## 2016-04-13 DIAGNOSIS — I5042 Chronic combined systolic (congestive) and diastolic (congestive) heart failure: Secondary | ICD-10-CM

## 2016-04-13 DIAGNOSIS — J9611 Chronic respiratory failure with hypoxia: Secondary | ICD-10-CM

## 2016-04-13 NOTE — Assessment & Plan Note (Signed)
Rate controlled 

## 2016-04-13 NOTE — Assessment & Plan Note (Signed)
On chronic O2 

## 2016-04-13 NOTE — Patient Instructions (Signed)
Medication Instructions: Your physician recommends that you continue on your current medications as directed. Please refer to the Current Medication list given to you today.   Follow-Up: Your physician recommends that you schedule a follow-up appointment in 2 months with Dr. Royann Shivers    If you need a refill on your cardiac medications before your next appointment, please call your pharmacy.

## 2016-04-13 NOTE — Progress Notes (Signed)
04/13/2016 Reynold Bowen   1933/08/12  [id number]  Primary Physician [name], MD Primary Cardiologist: Dr Royann Shivers  HPI:  Pleasant 83 y/o female seen in the office today in follow up. She was hospitalized in Nov 2017 with CHF. When seen in f/u her wgt was 100 lbs which was felt to be her baseline wgt. She saw Dr Royann Shivers 03/30/16 and her wgt was 113 lbs. Zaroxolyn 2.5 mg MWF was added and she is seen today for follow up. She arrives to the office with her son. She is on O2 and in a wheelchair. She tells me she is doing "OK". She has no energy. Her SOB is better than on her LOV though her wgt today is 114 lbs.    Current Outpatient Prescriptions  Medication Sig Dispense Refill  . acetaminophen (TYLENOL) 650 MG CR tablet Take 1,300 mg by mouth 3 (three) times daily as needed for pain.     Marland Kitchen allopurinol (ZYLOPRIM) 100 MG tablet Take 1 tablet (100 mg total) by mouth every morning. (Patient taking differently: Take 100 mg by mouth daily. ) 10 tablet 0  . feeding supplement, ENSURE COMPLETE, (ENSURE COMPLETE) LIQD Take 237 mLs by mouth 2 (two) times daily between meals. 60 Bottle 0  . furosemide (LASIX) 40 MG tablet Take 80 mg by mouth 2 (two) times daily.    . metolazone (ZAROXOLYN) 2.5 MG tablet Take 1 tablet (2.5 mg total) by mouth as directed. 10 tablet 0  . metoprolol tartrate (LOPRESSOR) 25 MG tablet Take 1 tablet (25 mg total) by mouth 2 (two) times daily. 60 tablet 0  . OXYGEN Inhale 2 L into the lungs continuous.    . pantoprazole (PROTONIX) 40 MG tablet Take 1 tablet (40 mg total) by mouth daily. 10 tablet 0  . senna (SENOKOT) 8.6 MG TABS tablet Take 1 tablet (8.6 mg total) by mouth daily. 120 each 0  . traMADol (ULTRAM) 50 MG tablet TAKE 1 TABLET BY MOUTH TWICE A DAY AS NEEDED FOR PAIN 60 tablet 2  . warfarin (COUMADIN) 1 MG tablet Take 3 mg by mouth daily at 6 PM.      No current facility-administered medications for this visit.     No Known Allergies  Past Medical  History:  Diagnosis Date  . Arthritis    "hips, knees, shoulders" (07/31/2015)  . Chronic diastolic CHF (congestive heart failure), NYHA class 2 (HCC)   . CKD (chronic kidney disease), stage IV (HCC)   . COPD (chronic obstructive pulmonary disease) (HCC)   . DVT (deep venous thrombosis) (HCC) "2-3 times"  . GERD (gastroesophageal reflux disease)   . Gout   . Hypertension   . Hyperthyroidism   . On home oxygen therapy    "2L; 24/7" (07/31/2015)  . Pneumonia   . Thyroid disease     Social History   Social History  . Marital status: Widowed    Spouse name: N/A  . Number of children: N/A  . Years of education: N/A   Occupational History  . Retired    Social History Main Topics  . Smoking status: Former Smoker    Packs/day: 2.00    Years: 50.00    Types: Cigarettes    Quit date: 03/12/2015  . Smokeless tobacco: Never Used  . Alcohol use No  . Drug use: No  . Sexual activity: Yes   Other Topics Concern  . Not on file   Social History Narrative   Good family support.  Family History  Problem Relation Age of Onset  . Stroke Neg Hx   . CAD Neg Hx      Review of Systems: General: negative for chills, fever, night sweats or weight changes.  Cardiovascular: negative for chest pain, dyspnea on exertion, edema, orthopnea, palpitations, paroxysmal nocturnal dyspnea or shortness of breath Dermatological: negative for rash Respiratory: negative for cough or wheezing Urologic: negative for hematuria Abdominal: negative for nausea, vomiting, diarrhea, bright red blood per rectum, melena, or hematemesis Neurologic: negative for visual changes, syncope, or dizziness All other systems reviewed and are otherwise negative except as noted above.    Blood pressure (!) 89/48, pulse 64, height  (1.626 m), weight 114 lb 3.2 oz (51.8 kg).  General appearance: alert, cooperative, appears older than stated age, cachectic, no distress and on O2 in wheelchair Neck: no  JVD Lungs: decreased, no rales Heart: irregularly irregular rhythm Extremities: 1+ edema   ASSESSMENT AND PLAN:   End stage renal disease (HCC) Not a HD candidate. Last SCr 4.5-BUN 90  Chronic combined systolic and diastolic CHF (congestive heart failure) (HCC) Wgt 114 which is up for her but symptomatically she is relativly stable.  Anemia associated with chronic renal failure Hgb 7.7 on 04/10/16  Permanent atrial fibrillation (HCC) Rate controlled  Chronic respiratory failure with hypoxia (HCC) On chronic O2  Chronic anticoagulation Followed by [facility]   PLAN  Discussed with Dr Royann Shivers in the office today. The pt has end stage renal failure, CAF, COPD, chronic diastolic CHF, and COPD on O2. The plan is for symptomatic adjustment in her diuretics- not based on her labs or wgt. She could benifit from IV Iron infusion- will defer this to her PCP. We doubt she could tolerate a transfusion. She does not have a nephrologist as she is not felt to be a dialysis candidate. F/U with dr Royann Shivers in 2 ,months.   Corine Shelter PA-C 04/13/2016 3:06 PM

## 2016-04-13 NOTE — Assessment & Plan Note (Signed)
Followed by PC{ 

## 2016-04-13 NOTE — Assessment & Plan Note (Signed)
Not a HD candidate. Last SCr 4.5-BUN 90

## 2016-04-13 NOTE — Assessment & Plan Note (Signed)
Wgt 114 which is up for her but symptomatically she is relativly stable.

## 2016-04-13 NOTE — Assessment & Plan Note (Signed)
Hgb 7.7 on 04/10/16

## 2016-05-18 ENCOUNTER — Inpatient Hospital Stay (HOSPITAL_COMMUNITY)
Admission: EM | Admit: 2016-05-18 | Discharge: 2016-05-20 | DRG: 377 | Disposition: A | Discharge status: Home Health | Payer: Medicare Other | Attending: Internal Medicine | Admitting: Internal Medicine

## 2016-05-18 ENCOUNTER — Encounter (HOSPITAL_COMMUNITY): Payer: Self-pay | Admitting: Emergency Medicine

## 2016-05-18 ENCOUNTER — Emergency Department (HOSPITAL_COMMUNITY): Payer: Medicare Other

## 2016-05-18 DIAGNOSIS — K922 Gastrointestinal hemorrhage, unspecified: Principal | ICD-10-CM

## 2016-05-18 DIAGNOSIS — I429 Cardiomyopathy, unspecified: Secondary | ICD-10-CM | POA: Diagnosis present

## 2016-05-18 DIAGNOSIS — Z9841 Cataract extraction status, right eye: Secondary | ICD-10-CM

## 2016-05-18 DIAGNOSIS — Z9981 Dependence on supplemental oxygen: Secondary | ICD-10-CM

## 2016-05-18 DIAGNOSIS — L89152 Pressure ulcer of sacral region, stage 2: Secondary | ICD-10-CM | POA: Diagnosis not present

## 2016-05-18 DIAGNOSIS — D638 Anemia in other chronic diseases classified elsewhere: Secondary | ICD-10-CM | POA: Diagnosis not present

## 2016-05-18 DIAGNOSIS — E0590 Thyrotoxicosis, unspecified without thyrotoxic crisis or storm: Secondary | ICD-10-CM | POA: Diagnosis not present

## 2016-05-18 DIAGNOSIS — Z96651 Presence of right artificial knee joint: Secondary | ICD-10-CM | POA: Diagnosis present

## 2016-05-18 DIAGNOSIS — I5043 Acute on chronic combined systolic (congestive) and diastolic (congestive) heart failure: Secondary | ICD-10-CM | POA: Diagnosis not present

## 2016-05-18 DIAGNOSIS — I82512 Chronic embolism and thrombosis of left femoral vein: Secondary | ICD-10-CM | POA: Diagnosis not present

## 2016-05-18 DIAGNOSIS — Z9842 Cataract extraction status, left eye: Secondary | ICD-10-CM

## 2016-05-18 DIAGNOSIS — R609 Edema, unspecified: Secondary | ICD-10-CM

## 2016-05-18 DIAGNOSIS — R531 Weakness: Secondary | ICD-10-CM

## 2016-05-18 DIAGNOSIS — Z961 Presence of intraocular lens: Secondary | ICD-10-CM | POA: Diagnosis present

## 2016-05-18 DIAGNOSIS — N184 Chronic kidney disease, stage 4 (severe): Secondary | ICD-10-CM | POA: Diagnosis not present

## 2016-05-18 DIAGNOSIS — J449 Chronic obstructive pulmonary disease, unspecified: Secondary | ICD-10-CM | POA: Diagnosis present

## 2016-05-18 DIAGNOSIS — D649 Anemia, unspecified: Secondary | ICD-10-CM | POA: Diagnosis not present

## 2016-05-18 DIAGNOSIS — I82531 Chronic embolism and thrombosis of right popliteal vein: Secondary | ICD-10-CM | POA: Diagnosis present

## 2016-05-18 DIAGNOSIS — K219 Gastro-esophageal reflux disease without esophagitis: Secondary | ICD-10-CM | POA: Diagnosis not present

## 2016-05-18 DIAGNOSIS — Z7901 Long term (current) use of anticoagulants: Secondary | ICD-10-CM

## 2016-05-18 DIAGNOSIS — I130 Hypertensive heart and chronic kidney disease with heart failure and stage 1 through stage 4 chronic kidney disease, or unspecified chronic kidney disease: Secondary | ICD-10-CM | POA: Diagnosis not present

## 2016-05-18 DIAGNOSIS — R0602 Shortness of breath: Secondary | ICD-10-CM

## 2016-05-18 DIAGNOSIS — Z90710 Acquired absence of both cervix and uterus: Secondary | ICD-10-CM

## 2016-05-18 DIAGNOSIS — I482 Chronic atrial fibrillation: Secondary | ICD-10-CM | POA: Diagnosis present

## 2016-05-18 DIAGNOSIS — L8990 Pressure ulcer of unspecified site, unspecified stage: Secondary | ICD-10-CM | POA: Diagnosis present

## 2016-05-18 DIAGNOSIS — D62 Acute posthemorrhagic anemia: Secondary | ICD-10-CM | POA: Diagnosis present

## 2016-05-18 DIAGNOSIS — I4821 Permanent atrial fibrillation: Secondary | ICD-10-CM | POA: Diagnosis present

## 2016-05-18 DIAGNOSIS — Z87891 Personal history of nicotine dependence: Secondary | ICD-10-CM

## 2016-05-18 DIAGNOSIS — E8770 Fluid overload, unspecified: Secondary | ICD-10-CM

## 2016-05-18 DIAGNOSIS — F419 Anxiety disorder, unspecified: Secondary | ICD-10-CM | POA: Diagnosis present

## 2016-05-18 DIAGNOSIS — I081 Rheumatic disorders of both mitral and tricuspid valves: Secondary | ICD-10-CM | POA: Diagnosis not present

## 2016-05-18 DIAGNOSIS — J438 Other emphysema: Secondary | ICD-10-CM

## 2016-05-18 DIAGNOSIS — J9610 Chronic respiratory failure, unspecified whether with hypoxia or hypercapnia: Secondary | ICD-10-CM | POA: Diagnosis not present

## 2016-05-18 DIAGNOSIS — Z79899 Other long term (current) drug therapy: Secondary | ICD-10-CM

## 2016-05-18 DIAGNOSIS — I509 Heart failure, unspecified: Secondary | ICD-10-CM

## 2016-05-18 DIAGNOSIS — R2240 Localized swelling, mass and lump, unspecified lower limb: Secondary | ICD-10-CM | POA: Diagnosis present

## 2016-05-18 DIAGNOSIS — R791 Abnormal coagulation profile: Secondary | ICD-10-CM | POA: Diagnosis not present

## 2016-05-18 LAB — BASIC METABOLIC PANEL
ANION GAP: 13 (ref 5–15)
BUN: 87 mg/dL — ABNORMAL HIGH (ref 6–20)
CHLORIDE: 104 mmol/L (ref 101–111)
CO2: 20 mmol/L — AB (ref 22–32)
Calcium: 10.1 mg/dL (ref 8.9–10.3)
Creatinine, Ser: 3.89 mg/dL — ABNORMAL HIGH (ref 0.44–1.00)
GFR calc Af Amer: 12 mL/min — ABNORMAL LOW (ref >60)
GFR, EST NON AFRICAN AMERICAN: 10 mL/min — AB (ref >60)
GLUCOSE: 114 mg/dL — AB (ref 65–99)
POTASSIUM: 3.9 mmol/L (ref 3.5–5.1)
Sodium: 137 mmol/L (ref 135–145)

## 2016-05-18 LAB — I-STAT TROPONIN, ED: Troponin i, poc: 0.07 ng/mL (ref 0.00–0.08)

## 2016-05-18 LAB — BRAIN NATRIURETIC PEPTIDE: B Natriuretic Peptide: 1299.3 pg/mL — ABNORMAL HIGH (ref 0.0–100.0)

## 2016-05-18 LAB — CBC
HEMATOCRIT: 21.9 % — AB (ref 36.0–46.0)
HEMOGLOBIN: 6.8 g/dL — AB (ref 12.0–15.0)
MCH: 28.1 pg (ref 26.0–34.0)
MCHC: 31.1 g/dL (ref 30.0–36.0)
MCV: 90.5 fL (ref 78.0–100.0)
Platelets: 166 10*3/uL (ref 150–400)
RBC: 2.42 MIL/uL — ABNORMAL LOW (ref 3.87–5.11)
RDW: 18.2 % — ABNORMAL HIGH (ref 11.5–15.5)
WBC: 4.2 10*3/uL (ref 4.0–10.5)

## 2016-05-18 LAB — PROTIME-INR
INR: 1.63
PROTHROMBIN TIME: 19.5 s — AB (ref 11.4–15.2)

## 2016-05-18 LAB — ABO/RH: ABO/RH(D): A POS

## 2016-05-18 LAB — PREPARE RBC (CROSSMATCH)

## 2016-05-18 MED ORDER — FUROSEMIDE 10 MG/ML IJ SOLN
60.0000 mg | Freq: Once | INTRAMUSCULAR | Status: AC
  Administered 2016-05-19: 60 mg via INTRAVENOUS
  Filled 2016-05-18: qty 6

## 2016-05-18 MED ORDER — SODIUM CHLORIDE 0.9 % IV SOLN: 250.0000 mL | INTRAVENOUS | Status: DC | PRN

## 2016-05-18 MED ORDER — FUROSEMIDE 10 MG/ML IJ SOLN
60.0000 mg | Freq: Two times a day (BID) | INTRAMUSCULAR | Status: DC
  Administered 2016-05-19 – 2016-05-20 (×3): 60 mg via INTRAVENOUS
  Filled 2016-05-18 (×4): qty 6

## 2016-05-18 MED ORDER — SODIUM CHLORIDE 0.9 % IV SOLN: 10.0000 mL/h | Freq: Once | INTRAVENOUS | Status: DC

## 2016-05-18 MED ORDER — SODIUM CHLORIDE 0.9% FLUSH
3.0000 mL | Freq: Two times a day (BID) | INTRAVENOUS | Status: DC
  Administered 2016-05-19 – 2016-05-20 (×4): 3 mL via INTRAVENOUS

## 2016-05-18 MED ORDER — ONDANSETRON HCL 4 MG/2ML IJ SOLN: 4.0000 mg | Freq: Four times a day (QID) | INTRAMUSCULAR | Status: DC | PRN

## 2016-05-18 MED ORDER — ACETAMINOPHEN 325 MG PO TABS
650.0000 mg | ORAL_TABLET | ORAL | Status: DC | PRN
  Administered 2016-05-19: 650 mg via ORAL
  Filled 2016-05-18: qty 2

## 2016-05-18 MED ORDER — SODIUM CHLORIDE 0.9% FLUSH: 3.0000 mL | INTRAVENOUS | Status: DC | PRN

## 2016-05-18 NOTE — ED Notes (Signed)
ED Provider at bedside. 

## 2016-05-18 NOTE — ED Notes (Signed)
Kayleigh-RN assisted me with the EKG.

## 2016-05-18 NOTE — ED Provider Notes (Signed)
[facility]-EMERGENCY DEPT Provider Note   CSN: [id number] Arrival date & time: 05/18/16  1929     History   Chief Complaint Chief Complaint  Patient presents with  . Leg Swelling    HPI [name] Alexander is a 83 y.o. female.  The history is provided by the patient and a relative.  Shortness of Breath  This is a new problem. The average episode lasts 2 weeks. The problem occurs continuously.The current episode started more than 1 week ago. The problem has been gradually worsening. Associated symptoms include orthopnea, leg pain and leg swelling. Pertinent negatives include no fever, no cough, no chest pain, no syncope, no vomiting and no abdominal pain. [name] Alexander has tried nothing for the symptoms. The treatment provided no relief.    Past Medical History:  Diagnosis Date  . Arthritis    "hips, knees, shoulders" (07/31/2015)  . Chronic diastolic CHF (congestive heart failure), NYHA class 2 (HCC)   . CKD (chronic kidney disease), stage IV (HCC)   . COPD (chronic obstructive pulmonary disease) (HCC)   . DVT (deep venous thrombosis) (HCC) "2-3 times"  . GERD (gastroesophageal reflux disease)   . Gout   . Hypertension   . Hyperthyroidism   . On home oxygen therapy    "2L; 24/7" (07/31/2015)  . Pneumonia   . Thyroid disease     Patient Active Problem List   Diagnosis Date Noted  . CHF exacerbation (HCC) 05/18/2016  . End stage renal disease (HCC) 04/13/2016  . Chronic anticoagulation 04/13/2016  . Anemia associated with chronic renal failure 12/26/2015  . Acute on chronic renal failure (HCC)   . Acute renal failure (ARF) (HCC) 11/08/2015  . Acute renal failure superimposed on stage 4 chronic kidney disease (HCC) 11/08/2015  . Chronic respiratory failure with hypoxia (HCC) 11/08/2015  . Hypokalemia   . Left hip pain   . Chest pain   . Coronary atherosclerosis 10/18/2015  . Goals of care, counseling/discussion   . Palliative care encounter   . Permanent atrial fibrillation (HCC)   .  COPD (chronic obstructive pulmonary disease) (HCC) 05/17/2015  . Chronic combined systolic and diastolic CHF (congestive heart failure) (HCC) 05/17/2015  . Protein-calorie malnutrition, severe 03/20/2015  . Dyspnea 03/18/2015  . CKD (chronic kidney disease), stage IV (HCC) 03/18/2015  . Tobacco abuse 03/18/2015  . Normocytic anemia 12/12/2013  . Edema 12/12/2013  . Community acquired pneumonia 06/11/2011  . Hyperthyroidism 06/11/2011  . Glaucoma 06/11/2011  . DVT (deep venous thrombosis) (HCC) 06/11/2011  . Hypertension   . Gout     Past Surgical History:  Procedure Laterality Date  . CATARACT EXTRACTION W/ INTRAOCULAR LENS  IMPLANT, BILATERAL Bilateral   . JOINT REPLACEMENT    . TOTAL KNEE ARTHROPLASTY Right 06/2004   [name]/notes 05/26/2010  . VAGINAL HYSTERECTOMY      OB History    No data available       Home Medications    Prior to Admission medications   Medication Sig Start Date End Date Taking? Authorizing Provider  allopurinol (ZYLOPRIM) 100 MG tablet Take 1 tablet (100 mg total) by mouth every morning. Patient taking differently: Take 100 mg by mouth daily.  07/10/15  Yes Jonah BlueYates, Jennifer, MD  furosemide (LASIX) 20 MG tablet Take 80 mg by mouth daily.   Yes [provider]  gabapentin (NEURONTIN) 300 MG capsule Take 300 mg by mouth at bedtime.   Yes [provider]  metoprolol tartrate (LOPRESSOR) 25 MG tablet Take 1 tablet (25 mg  total) by mouth 2 (two) times daily. 11/13/15  Yes Hongalgi, Maximino Greenland, MD  pantoprazole (PROTONIX) 40 MG tablet Take 1 tablet (40 mg total) by mouth daily. 07/10/15  Yes Jonah Blue, MD  senna (SENOKOT) 8.6 MG TABS tablet Take 1 tablet (8.6 mg total) by mouth daily. 07/10/15  Yes Jonah Blue, MD  traMADol (ULTRAM) 50 MG tablet TAKE 1 TABLET BY MOUTH TWICE A DAY AS NEEDED FOR PAIN 03/08/16  Yes Tarry Kos, MD  warfarin (COUMADIN) 1 MG tablet Take 3 mg by mouth daily at 6 PM.  09/15/15  Yes [provider]  feeding  supplement, ENSURE COMPLETE, (ENSURE COMPLETE) LIQD Take 237 mLs by mouth 2 (two) times daily between meals. Patient not taking: Reported on 05/18/2016 12/18/13   Dhungel, Theda Belfast, MD  metolazone (ZAROXOLYN) 2.5 MG tablet Take 1 tablet (2.5 mg total) by mouth as directed. Patient not taking: Reported on 05/18/2016 03/30/16 06/28/16  Croitoru, Mihai, MD  OXYGEN Inhale 2 L into the lungs continuous.    [provider]    Family History Family History  Problem Relation Age of Onset  . Stroke Neg Hx   . CAD Neg Hx     Social History Social History  Substance Use Topics  . Smoking status: Former Smoker    Packs/day: 2.00    Years: 50.00    Types: Cigarettes    Quit date: 03/12/2015  . Smokeless tobacco: Never Used  . Alcohol use No     Allergies   Patient has no known allergies.   Review of Systems Review of Systems  Constitutional: Negative for fever.  HENT: Negative.   Respiratory: Positive for shortness of breath. Negative for cough.   Cardiovascular: Positive for orthopnea and leg swelling. Negative for chest pain and syncope.  Gastrointestinal: Negative for abdominal pain, diarrhea, nausea and vomiting.  Genitourinary: Negative.   Neurological: Negative.   All other systems reviewed and are negative.    Physical Exam Updated Vital Signs BP 138/75   Pulse 77   Temp 97.9 F (36.6 C) (Oral)   Resp 19   SpO2 97%   Physical Exam  Constitutional: [name] Alexander is oriented to person, place, and time. [name] Alexander appears well-developed and well-nourished. No distress.  HENT:  Head: Normocephalic and atraumatic.  Mouth/Throat: Oropharynx is clear and moist.  Eyes: Conjunctivae and EOM are normal.  Neck: Normal range of motion. Neck supple. JVD present.  Cardiovascular: Normal rate, regular rhythm, normal heart sounds and intact distal pulses.   No murmur heard. Pulmonary/Chest: Effort normal. No respiratory distress. [name] Alexander has rales (bibasilar worse on left).  Abdominal: Soft. [name] Alexander  exhibits no distension. There is no tenderness.  Genitourinary:  Genitourinary Comments: No gross blood on rectal  Musculoskeletal: [name] Alexander exhibits edema (3+ pitting edema b/l LE). [name] Alexander exhibits no deformity.  Neurological: [name] Alexander is alert and oriented to person, place, and time. No cranial nerve deficit. [name] Alexander exhibits normal muscle tone. Coordination normal.  Skin: Skin is warm and dry.  Psychiatric: [name] Alexander has a normal mood and affect.  Nursing note and vitals reviewed.    ED Treatments / Results  Labs (all labs ordered are listed, but only abnormal results are displayed) Labs Reviewed  BASIC METABOLIC PANEL - Abnormal; Notable for the following:       Result Value   CO2 20 (*)    Glucose, Bld 114 (*)    BUN 87 (*)    Creatinine, Ser 3.89 (*)    GFR calc non Af Denyse Dago  10 (*)    GFR calc Af Amer 12 (*)    All other components within normal limits  CBC - Abnormal; Notable for the following:    RBC 2.42 (*)    Hemoglobin 6.8 (*)    HCT 21.9 (*)    RDW 18.2 (*)    All other components within normal limits  BRAIN NATRIURETIC PEPTIDE - Abnormal; Notable for the following:    B Natriuretic Peptide 1,299.3 (*)    All other components within normal limits  PROTIME-INR - Abnormal; Notable for the following:    Prothrombin Time 19.5 (*)    All other components within normal limits  OCCULT BLOOD X 1 CARD TO LAB, STOOL  BASIC METABOLIC PANEL  TROPONIN I  TROPONIN I  TROPONIN I  CBC WITH DIFFERENTIAL/PLATELET  T3, FREE  I-STAT TROPOININ, ED  TYPE AND SCREEN  PREPARE RBC (CROSSMATCH)  ABO/RH    EKG  EKG Interpretation  Date/Time:  Tuesday May 18 2016 21:50:52 EDT Ventricular Rate:  68 PR Interval:    QRS Duration: 127 QT Interval:  497 QTC Calculation: 529 R Axis:   -71 Text Interpretation:  Atrial fibrillation Nonspecific IVCD with LAD Anterior infarct, old Nonspecific T abnormalities, lateral leads Minimal ST elevation, lateral leads Baseline wander in lead(s) V5 No significant  change since last tracing Confirmed by KNAPP  MD-J, JON ([id number]) on 05/18/2016 10:00:54 PM       Radiology Dg Chest Portable 1 View  Result Date: 05/18/2016 CLINICAL DATA:  Tired, shortness of breath, and leg swelling for 2 weeks. History of hypertension, COPD, CHF. EXAM: PORTABLE CHEST 1 VIEW COMPARISON:  11/07/2015 FINDINGS: Cardiac enlargement. No pulmonary vascular congestion or edema. No focal consolidation. Blunting of the costophrenic angles suggesting small pleural effusions. No pneumothorax. Calcified and tortuous aorta. Degenerative changes in the shoulders. IMPRESSION: Cardiac enlargement. Small pleural effusions. No focal consolidation or edema. Electronically Signed   By: Burman Nieves M.D.   On: 05/18/2016 22:02    Procedures Procedures (including critical care time)  Medications Ordered in ED Medications  0.9 %  sodium chloride infusion (not administered)  sodium chloride flush (NS) 0.9 % injection 3 mL (not administered)  sodium chloride flush (NS) 0.9 % injection 3 mL (not administered)  0.9 %  sodium chloride infusion (not administered)  acetaminophen (TYLENOL) tablet 650 mg (not administered)  ondansetron (ZOFRAN) injection 4 mg (not administered)  furosemide (LASIX) injection 60 mg (not administered)  furosemide (LASIX) injection 60 mg (60 mg Intravenous Given 05/19/16 0018)     Initial Impression / Assessment and Plan / ED Course  I have reviewed the triage vital signs and the nursing notes.  Pertinent labs & imaging results that were available during my care of the patient were reviewed by me and considered in my medical decision making (see chart for details).     Patient is a 83 year old female multiple medical problems as well as requiring 2 L nasal cannula at baseline who presents with gradually worsening shortness of breath, peripheral edema, and fatigue. Further history and exam as above with reassuring vitals on her home O2 but [name] Alexander is clinically volume  overloaded. Labs obtained with hemoglobin in the 6s as well as elevated BNP. At this time concern for symptomatic anemia as well as volume overload. We'll give a dose of Lasix as well as a dose of pack red blood cells. No gross blood on rectal exam. No obvious source of bleeding at this time. Patient will be admitted to  medicine for further management and evaluation.  Final Clinical Impressions(s) / ED Diagnoses   Final diagnoses:  Symptomatic anemia  Hypervolemia, unspecified hypervolemia type  Peripheral edema  SOB (shortness of breath)  Weakness    New Prescriptions New Prescriptions   No medications on file     Marijean Niemann, MD 05/19/16 1610    Linwood Dibbles, MD 05/19/16 1620

## 2016-05-18 NOTE — ED Notes (Signed)
Admitting provider at bedside. Will hang blood when provider has finished assessment.

## 2016-05-18 NOTE — H&P (Addendum)
History and Physical    [name] BowenLillie B Alexander ZOX:[id number] DOB: [birth year] DOA: 05/18/2016  Referring MD/NP/PA: Armond HangWoodrum, MD(Resident) PCP: Laurena Slimmerlark, [name], MD  Patient coming from: Home  Chief Complaint: Lower extremity swelling  HPI: [name] Alexander is a 83 y.o. female with medical history significant of CHF EF 45-50% in 06/2015, COPD on 2L home O2, anemia, CKD IV, chronic DVT on warfarin, Afib, and hyperthyroidism previously on methimazole; who presents with complaints of lower extremity swelling for the last 2 weeks. She states her last weight check was over a week ago and at that time she was 117 pounds. However leg swelling symptoms have progressively worsened to the point which she was unable to lift her feet up to step onto the scale 3 days ago. Patient reports being extremely short of breath even taking 10-20 steps from her chair to her potty seat. Normally she ambulates using a walker however has become so weak that she needs significant assistance in doing so at this time. Denies any significant cough, fever, chest pain, loss of consciousness, abdominal pain, NSAID use, dark/bloody stools, or dysuria. Other associated symptoms include fatigue generalized weakness, intermittent nausea, leg shaking, chronically cold natured, and decreased appetite. She notes being followed by Dr. Jomarie Longsroituru. She has been taking her Coumadin as prescribed and previously notes having therapeutic INRs.  ED Course: Upon admission to the emergency department patient was noted to have stable vital signs on 3 L nasal cannula oxygen. Labs revealed hemoglobin 6.8, BUN 87, creatinine 3.89, INR 1.63, BNP 1299.3, and troponin I 0.07. Chest x-ray showed cardiomegaly with pleural effusions. Patient was given 60 mg of Lasix IV 1 dose and transfuse 1 unit of PRBCs.   Review of Systems: As per HPI otherwise 10 point review of systems negative.   Past Medical History:  Diagnosis Date  . Arthritis    "hips, knees, shoulders"  (07/31/2015)  . Chronic diastolic CHF (congestive heart failure), NYHA class 2 (HCC)   . CKD (chronic kidney disease), stage IV (HCC)   . COPD (chronic obstructive pulmonary disease) (HCC)   . DVT (deep venous thrombosis) (HCC) "2-3 times"  . GERD (gastroesophageal reflux disease)   . Gout   . Hypertension   . Hyperthyroidism   . On home oxygen therapy    "2L; 24/7" (07/31/2015)  . Pneumonia   . Thyroid disease     Past Surgical History:  Procedure Laterality Date  . CATARACT EXTRACTION W/ INTRAOCULAR LENS  IMPLANT, BILATERAL Bilateral   . JOINT REPLACEMENT    . TOTAL KNEE ARTHROPLASTY Right 06/2004   [name]/notes 05/26/2010  . VAGINAL HYSTERECTOMY       reports that she quit smoking about 14 months ago. Her smoking use included Cigarettes. She has a 100.00 pack-year smoking history. She has never used smokeless tobacco. She reports that she does not drink alcohol or use drugs.  No Known Allergies  Family History  Problem Relation Age of Onset  . Stroke Neg Hx   . CAD Neg Hx     Prior to Admission medications   Medication Sig Start Date End Date Taking? Authorizing Provider  acetaminophen (TYLENOL) 650 MG CR tablet Take 1,300 mg by mouth 3 (three) times daily as needed for pain.     [provider]  allopurinol (ZYLOPRIM) 100 MG tablet Take 1 tablet (100 mg total) by mouth every morning. Patient taking differently: Take 100 mg by mouth daily.  07/10/15   Jonah BlueYates, Jennifer, MD  feeding supplement, ENSURE COMPLETE, (ENSURE  COMPLETE) LIQD Take 237 mLs by mouth 2 (two) times daily between meals. 12/18/13   Dhungel, Theda Belfast, MD  furosemide (LASIX) 40 MG tablet Take 80 mg by mouth 2 (two) times daily.    [provider]  metolazone (ZAROXOLYN) 2.5 MG tablet Take 1 tablet (2.5 mg total) by mouth as directed. 03/30/16 06/28/16  Croitoru, Mihai, MD  metoprolol tartrate (LOPRESSOR) 25 MG tablet Take 1 tablet (25 mg total) by mouth 2 (two) times daily. 11/13/15   Hongalgi, Maximino Greenland, MD   OXYGEN Inhale 2 L into the lungs continuous.    [provider]  pantoprazole (PROTONIX) 40 MG tablet Take 1 tablet (40 mg total) by mouth daily. 07/10/15   Jonah Blue, MD  senna (SENOKOT) 8.6 MG TABS tablet Take 1 tablet (8.6 mg total) by mouth daily. 07/10/15   Jonah Blue, MD  traMADol (ULTRAM) 50 MG tablet TAKE 1 TABLET BY MOUTH TWICE A DAY AS NEEDED FOR PAIN 03/08/16   Tarry Kos, MD  warfarin (COUMADIN) 1 MG tablet Take 3 mg by mouth daily at 6 PM.  09/15/15   [provider]    Physical Exam:    Constitutional: Frail elderly female in no acute distress at this time Vitals:   05/18/16 2145 05/18/16 2202 05/18/16 2203 05/18/16 2215  BP:  114/62  112/67  Pulse:  65    Resp:  (!) 21  15  Temp:  97.8 F (36.6 C)    TempSrc:  Oral    SpO2: 100% 100% 100%    Eyes: Arcus senilis present and disconjugate gaze. ENMT: Mucous membranes are moist. Posterior pharynx clear of any exudate or lesions.  Neck: normal, supple, no masses, no thyromegaly, positive JVD Respiratory: Fine crackles appreciated in the lower lung fields, no wheezing. Normal respiratory effort. No accessory muscle use. Currently on 3 L of nasal cannula oxygen. Cardiovascular: Regular rate and rhythm, no murmurs / rubs / gallops. 2+ pitting bilateral lower extremity edema. 2+ pedal pulses. No carotid bruits.  Abdomen: no tenderness, no masses palpated. No hepatosplenomegaly. Bowel sounds positive.  Musculoskeletal: no clubbing / cyanosis. No joint deformity upper and lower extremities. Good ROM, no contractures. Normal muscle tone.  Skin: no rashes, lesions, ulcers. No induration Neurologic: CN 2-12 grossly intact. Sensation intact, DTR normal. Strength 5/5 in all 4.  Psychiatric: Normal judgment and insight. Alert and oriented x 3. Normal mood.     Labs on Admission: I have personally reviewed following labs and imaging studies  CBC:  Recent Labs Lab 05/18/16 2050  WBC 4.2  HGB 6.8*    HCT 21.9*  MCV 90.5  PLT 166   Basic Metabolic Panel:  Recent Labs Lab 05/18/16 2050  NA 137  K 3.9  CL 104  CO2 20*  GLUCOSE 114*  BUN 87*  CREATININE 3.89*  CALCIUM 10.1   GFR: CrCl cannot be calculated (Unknown ideal weight.). Liver Function Tests: No results for input(s): AST, ALT, ALKPHOS, BILITOT, PROT, ALBUMIN in the last 168 hours. No results for input(s): LIPASE, AMYLASE in the last 168 hours. No results for input(s): AMMONIA in the last 168 hours. Coagulation Profile:  Recent Labs Lab 05/18/16 2154  INR 1.63   Cardiac Enzymes: No results for input(s): CKTOTAL, CKMB, CKMBINDEX, TROPONINI in the last 168 hours. BNP (last 3 results) No results for input(s): PROBNP in the last 8760 hours. HbA1C: No results for input(s): HGBA1C in the last 72 hours. CBG: No results for input(s): GLUCAP in the last 168  hours. Lipid Profile: No results for input(s): CHOL, HDL, LDLCALC, TRIG, CHOLHDL, LDLDIRECT in the last 72 hours. Thyroid Function Tests: No results for input(s): TSH, T4TOTAL, FREET4, T3FREE, THYROIDAB in the last 72 hours. Anemia Panel: No results for input(s): VITAMINB12, FOLATE, FERRITIN, TIBC, IRON, RETICCTPCT in the last 72 hours. Urine analysis:    Component Value Date/Time   COLORURINE YELLOW 11/08/2015 0518   APPEARANCEUR CLEAR 11/08/2015 0518   LABSPEC 1.010 11/08/2015 0518   PHURINE 7.5 11/08/2015 0518   GLUCOSEU NEGATIVE 11/08/2015 0518   HGBUR SMALL (A) 11/08/2015 0518   BILIRUBINUR NEGATIVE 11/08/2015 0518   KETONESUR NEGATIVE 11/08/2015 0518   PROTEINUR NEGATIVE 11/08/2015 0518   UROBILINOGEN 0.2 12/12/2013 0414   NITRITE NEGATIVE 11/08/2015 0518   LEUKOCYTESUR NEGATIVE 11/08/2015 0518   Sepsis Labs: No results found for this or any previous visit (from the past 240 hour(s)).   Radiological Exams on Admission: Dg Chest Portable 1 View  Result Date: 05/18/2016 CLINICAL DATA:  Tired, shortness of breath, and leg swelling for 2 weeks.  History of hypertension, COPD, CHF. EXAM: PORTABLE CHEST 1 VIEW COMPARISON:  11/07/2015 FINDINGS: Cardiac enlargement. No pulmonary vascular congestion or edema. No focal consolidation. Blunting of the costophrenic angles suggesting small pleural effusions. No pneumothorax. Calcified and tortuous aorta. Degenerative changes in the shoulders. IMPRESSION: Cardiac enlargement. Small pleural effusions. No focal consolidation or edema. Electronically Signed   By: Burman Nieves M.D.   On: 05/18/2016 22:02    EKG: Independently reviewed. Atrial fibrillation  Assessment/Plan Symptomatic anemia/suspected GI bleed: Baseline hemoglobin normally had been between 8 and 9, however patient presents with hemoglobin 6.8. The last check on record noted that patient have a hemoglobin of 7.7 on 3/31. - Admit to telemetry bed - Continue transfusion of 1 unit PRBC - repeat CBC in a.m - Stool guaiac was found to be positive - May likely warrant a consultation to GI in a.m.  Combined systolic and diastolic CHF exacerbation: Acute on chronic. Patient presents with lower extremity swelling and edema. Last echo showing EF of 45-50% in 06/2015. Chest x-ray shows cardiac enlargement with small pleural effusions. BNP elevated at 1299.3. Given 60 mg of Lasix IV. Patient has not been taking Zaroxolyn. - Heart failure orders initiated  - Strict I&Os and daily weights - Trend cardiac troponin  - Lasix 60 mg IV twice a day - Continue metoprolol - Check echocardiogram in a.m. - Consult cardiology if warranted in a.m.   Chronic kidney disease stage IV: Slightly improved with creatinine noted to be 3.89 and BUN 87 as previous baseline Cr >4.0 since 10/2015. - continue to monitor  H/O Hyperthyroidism: Methimazole was not noted on patient's current medication list. Per her last discharge summary and 10/2015 who appears she was still supposed to be on this medication. - Added on TSH/ FT3     H/O DVTs, Chronic atrial  fibrillation, chronic anticoagulation with subtherapeutic INR: Acute. Patient's initial INR was 1.63, but heart rates appear to be rate controlled.CHADSVASc = 5 Last vascular duplex study of the lower extremities noted mixed acute on chronic DVT of the right popliteal vein and left common femoral vein in 03/2015.  - Hold anticoagulation - Question need of IVC filter given history of clots and permanent atrial fibrillation.  COPD/chronic respiratory failure: Patient appears stable on 3 L of nasal cannula oxygen - Continuous pulse oximetry with nasal cannula oxygen - Duonebs prn SOB/Wheezing  Generalized weakness - Physical therapy to eval and treat   GERD - Continue  Protonix  DVT prophylaxis: SCDs Code Status: Full  Family Communication: Discussed plan of care with the patient and family present at bedside Disposition Plan: Likely discharge home once medically stable  Consults called: none Admission status: Inpatient  Clydie Braun MD [facility] Pager [phone]  If 7PM-7AM, please contact night-coverage [email or web address] Password [facility]  05/18/2016, 10:43 PM

## 2016-05-18 NOTE — ED Triage Notes (Signed)
Patient arrives from home with family. Has history of CHF. Over the past 2 weeks patient has experienced increasing swelling of lower extremities. Patient endorses exertional SOB. Family states patient has been sleeping more and unable to lift left leg.

## 2016-05-19 ENCOUNTER — Encounter (HOSPITAL_COMMUNITY): Payer: Self-pay

## 2016-05-19 DIAGNOSIS — L8990 Pressure ulcer of unspecified site, unspecified stage: Secondary | ICD-10-CM | POA: Diagnosis present

## 2016-05-19 DIAGNOSIS — K922 Gastrointestinal hemorrhage, unspecified: Secondary | ICD-10-CM | POA: Diagnosis present

## 2016-05-19 DIAGNOSIS — D649 Anemia, unspecified: Secondary | ICD-10-CM | POA: Diagnosis not present

## 2016-05-19 DIAGNOSIS — R2240 Localized swelling, mass and lump, unspecified lower limb: Secondary | ICD-10-CM | POA: Diagnosis not present

## 2016-05-19 DIAGNOSIS — R791 Abnormal coagulation profile: Secondary | ICD-10-CM | POA: Diagnosis present

## 2016-05-19 LAB — BPAM RBC
BLOOD PRODUCT EXPIRATION DATE: 201805182359
ISSUE DATE / TIME: 201805082321
UNIT TYPE AND RH: 6200

## 2016-05-19 LAB — CBC WITH DIFFERENTIAL/PLATELET
BASOS ABS: 0 10*3/uL (ref 0.0–0.1)
Basophils Relative: 0 %
EOS ABS: 0.1 10*3/uL (ref 0.0–0.7)
Eosinophils Relative: 3 %
HEMATOCRIT: 25.6 % — AB (ref 36.0–46.0)
HEMOGLOBIN: 8 g/dL — AB (ref 12.0–15.0)
LYMPHS PCT: 26 %
Lymphs Abs: 1.3 10*3/uL (ref 0.7–4.0)
MCH: 28.1 pg (ref 26.0–34.0)
MCHC: 31.3 g/dL (ref 30.0–36.0)
MCV: 89.8 fL (ref 78.0–100.0)
MONO ABS: 0.3 10*3/uL (ref 0.1–1.0)
Monocytes Relative: 6 %
NEUTROS PCT: 65 %
Neutro Abs: 3.2 10*3/uL (ref 1.7–7.7)
Platelets: 156 10*3/uL (ref 150–400)
RBC: 2.85 MIL/uL — AB (ref 3.87–5.11)
RDW: 17 % — ABNORMAL HIGH (ref 11.5–15.5)
WBC: 4.9 10*3/uL (ref 4.0–10.5)

## 2016-05-19 LAB — BASIC METABOLIC PANEL
Anion gap: 13 (ref 5–15)
BUN: 85 mg/dL — ABNORMAL HIGH (ref 6–20)
CALCIUM: 10.3 mg/dL (ref 8.9–10.3)
CO2: 21 mmol/L — AB (ref 22–32)
CREATININE: 3.65 mg/dL — AB (ref 0.44–1.00)
Chloride: 104 mmol/L (ref 101–111)
GFR calc Af Amer: 13 mL/min — ABNORMAL LOW (ref >60)
GFR calc non Af Amer: 11 mL/min — ABNORMAL LOW (ref >60)
GLUCOSE: 95 mg/dL (ref 65–99)
Potassium: 4 mmol/L (ref 3.5–5.1)
Sodium: 138 mmol/L (ref 135–145)

## 2016-05-19 LAB — TROPONIN I
Troponin I: 0.05 ng/mL (ref <0.03)
Troponin I: 0.07 ng/mL (ref <0.03)
Troponin I: 0.06 ng/mL (ref <0.03)

## 2016-05-19 LAB — TYPE AND SCREEN
ABO/RH(D): A POS
ANTIBODY SCREEN: NEGATIVE
UNIT DIVISION: 0

## 2016-05-19 LAB — T4, FREE: Free T4: 0.87 ng/dL (ref 0.61–1.12)

## 2016-05-19 LAB — POC OCCULT BLOOD, ED: Fecal Occult Bld: POSITIVE — AB

## 2016-05-19 LAB — TSH: TSH: 0.793 u[IU]/mL (ref 0.350–4.500)

## 2016-05-19 MED ORDER — ALLOPURINOL 100 MG PO TABS
100.0000 mg | ORAL_TABLET | Freq: Every day | ORAL | Status: DC
  Administered 2016-05-19 – 2016-05-20 (×2): 100 mg via ORAL
  Filled 2016-05-19 (×2): qty 1

## 2016-05-19 MED ORDER — TRAMADOL HCL 50 MG PO TABS
50.0000 mg | ORAL_TABLET | Freq: Two times a day (BID) | ORAL | Status: DC | PRN
  Administered 2016-05-19 – 2016-05-20 (×3): 50 mg via ORAL
  Filled 2016-05-19 (×3): qty 1

## 2016-05-19 MED ORDER — GABAPENTIN 300 MG PO CAPS
300.0000 mg | ORAL_CAPSULE | Freq: Every day | ORAL | Status: DC
  Administered 2016-05-19: 300 mg via ORAL
  Filled 2016-05-19: qty 1

## 2016-05-19 MED ORDER — TRAMADOL HCL 50 MG PO TABS
50.0000 mg | ORAL_TABLET | Freq: Once | ORAL | Status: AC
  Administered 2016-05-20: 50 mg via ORAL
  Filled 2016-05-19: qty 1

## 2016-05-19 MED ORDER — SENNA 8.6 MG PO TABS
1.0000 | ORAL_TABLET | Freq: Every day | ORAL | Status: DC
  Administered 2016-05-19 – 2016-05-20 (×2): 8.6 mg via ORAL
  Filled 2016-05-19 (×2): qty 1

## 2016-05-19 MED ORDER — PANTOPRAZOLE SODIUM 40 MG PO TBEC
40.0000 mg | DELAYED_RELEASE_TABLET | Freq: Every day | ORAL | Status: DC
  Administered 2016-05-19 – 2016-05-20 (×2): 40 mg via ORAL
  Filled 2016-05-19 (×2): qty 1

## 2016-05-19 MED ORDER — METOPROLOL TARTRATE 25 MG PO TABS
25.0000 mg | ORAL_TABLET | Freq: Two times a day (BID) | ORAL | Status: DC
  Administered 2016-05-19 – 2016-05-20 (×4): 25 mg via ORAL
  Filled 2016-05-19 (×4): qty 1

## 2016-05-19 MED ORDER — IPRATROPIUM-ALBUTEROL 0.5-2.5 (3) MG/3ML IN SOLN: 3.0000 mL | RESPIRATORY_TRACT | Status: DC | PRN

## 2016-05-19 NOTE — Care Management Note (Signed)
Case Management Note Donn PieriniKristi Manasseh Pittsley RN, BSN Unit 2W-Case Manager [phone]  Patient Details  Name: [name] BowenLillie B Alexander MRN: [id number] Date of Birth: 07-Jan-1934  Subjective/Objective:    Pt admitted with HF- acute on chronic, symptomatic anemia                Action/Plan: PTA pt lived at home with family, has home 02 (baseline 2L)- CM will follow for d/c needs  Expected Discharge Date:                  Expected Discharge Plan:  Home w Home Health Services  In-House Referral:     Discharge planning Services  CM Consult  Post Acute Care Choice:    Choice offered to:     DME Arranged:    DME Agency:     HH Arranged:    HH Agency:     Status of Service:  In process, will continue to follow  If discussed at Long Length of Stay Meetings, dates discussed:    Additional Comments:  Darrold SpanWebster, Ilyana Manuele Hall, RN 05/19/2016, 10:50 AM

## 2016-05-19 NOTE — Evaluation (Signed)
Physical Therapy Evaluation Patient Details Name: [name] Alexander MRN: [id number] DOB: January 05, 1934 Today's Date: 05/19/2016   History of Present Illness  82y.o.femalewith medical history significant of CHF, COPD, anemia, CKD IV,chronic DVT on warfarin, Afib,and hyperthyroidism; who presents with complaints of lower extremity swelling and SOB in setting of symptomatic anemia, poss GIB, and acute CHF exacerbation.  Clinical Impression  Orders received for PT evaluation. Patient demonstrates deficits in functional mobility as indicated below. Will benefit from continued skilled PT to address deficits and maximize function. Will see as indicated and progress as tolerated.  Will recommend trial of HHPT upon acute discharge.    Follow Up Recommendations Home health PT;Supervision/Assistance - 24 hour    Equipment Recommendations  None recommended by PT    Recommendations for Other Services       Precautions / Restrictions Precautions Precautions: Fall Precaution Comments: wears O2 at baseline 2 liters      Mobility  Bed Mobility Overal bed mobility: Needs Assistance Bed Mobility: Supine to Sit;Sit to Supine     Supine to sit: Max assist Sit to supine: Max assist   General bed mobility comments: Mas assist for positioning and movement <> EOb  Transfers Overall transfer level: Needs assistance Equipment used: Rolling walker (2 wheeled) Transfers: Sit to/from [employer] Sit to Stand: Max assist Stand pivot transfers: Max assist       General transfer comment: Max assist with wrap around support and use of gait belt chuck pad  Ambulation/Gait             General Gait Details: unable to perform despite 2 trials  Stairs            Wheelchair Mobility    Modified Rankin (Stroke Patients Only)       Balance Overall balance assessment: Needs assistance   Sitting balance-Leahy Scale: Fair     Standing balance support: Bilateral upper  extremity supported Standing balance-Leahy Scale: Poor Standing balance comment: heavy reliance on UE assist                             Pertinent Vitals/Pain Pain Assessment: Faces Faces Pain Scale: Hurts even more Pain Location: Arthritic pain generalized in LEs (predominantly LLE) Pain Descriptors / Indicators: Sore;Aching Pain Intervention(s): Monitored during session    Home Living Family/patient expects to be discharged to:: Private residence Living Arrangements: Children Available Help at Discharge: Available 24 hours/day Type of Home: House Home Access: Stairs to enter Entrance Stairs-Rails: Right;Left;Can reach both Entrance Stairs-Number of Steps: 3 Home Layout: One level Home Equipment: Environmental consultant - 2 wheels;Shower seat;Other (comment) (oxygen) Additional Comments: Aide 9am-11am M-F    Prior Function Level of Independence: Needs assistance   Gait / Transfers Assistance Needed: Ambulates household distances w/ RW            Hand Dominance   Dominant Hand: Right    Extremity/Trunk Assessment   Upper Extremity Assessment Upper Extremity Assessment: Generalized weakness    Lower Extremity Assessment Lower Extremity Assessment: Generalized weakness;LLE deficits/detail LLE Deficits / Details: marked assymetrical weakness at baseline LLE: Unable to fully assess due to pain LLE Coordination: decreased fine motor;decreased gross motor    Cervical / Trunk Assessment Cervical / Trunk Assessment: Kyphotic  Communication   Communication: HOH  Cognition Arousal/Alertness: Awake/alert Behavior During Therapy: WFL for tasks assessed/performed Overall Cognitive Status: Within Functional Limits for tasks assessed  General Comments      Exercises     Assessment/Plan    PT Assessment Patient needs continued PT services  PT Problem List Decreased strength;Decreased range of motion;Decreased  activity tolerance;Decreased balance;Decreased mobility;Decreased safety awareness;Cardiopulmonary status limiting activity;Pain       PT Treatment Interventions DME instruction;Gait training;Stair training;Functional mobility training;Therapeutic activities;Therapeutic exercise;Balance training;Patient/family education    PT Goals (Current goals can be found in the Care Plan section)  Acute Rehab PT Goals Patient Stated Goal: to feel better PT Goal Formulation: With patient/family Time For Goal Achievement: 06/02/16 Potential to Achieve Goals: Fair    Frequency Min 2X/week   Barriers to discharge        Co-evaluation               AM-PAC PT "6 Clicks" Daily Activity  Outcome Measure Difficulty turning over in bed (including adjusting bedclothes, sheets and blankets)?: Total Difficulty moving from lying on back to sitting on the side of the bed? : Total Difficulty sitting down on and standing up from a chair with arms (e.g., wheelchair, bedside commode, etc,.)?: Total Help needed moving to and from a bed to chair (including a wheelchair)?: Total Help needed walking in hospital room?: Total Help needed climbing 3-5 steps with a railing? : Total 6 Click Score: 6    End of Session Equipment Utilized During Treatment: Gait belt;Oxygen Activity Tolerance: No increased pain;Patient limited by fatigue Patient left: in bed;with call bell/phone within reach;with family/visitor present;with SCD's reapplied Nurse Communication: Mobility status PT Visit Diagnosis: Muscle weakness (generalized) (M62.81)    Time: [id number] PT Time Calculation (min) (ACUTE ONLY): 22 min   Charges:   PT Evaluation $PT Eval Moderate Complexity: 1 Procedure     PT G Codes:        Charlotte Crumbevon Antonea Gaut, PT DPT  [phone]   Fabio Asaevon J Tahnee Cifuentes 05/19/2016, 1:46 PM

## 2016-05-19 NOTE — ED Notes (Signed)
On collection of troponin, pt continuously states "get it out now." Pt began twisting causing the needle to leave the vein, pt then refused to be stuck again for more blood specimen. Small amount of blood collected was sent to lab.

## 2016-05-19 NOTE — ED Notes (Signed)
Attempted report, will call again in 5-10 minutes. If nurse unavailable will give bedside report.

## 2016-05-19 NOTE — Progress Notes (Addendum)
PROGRESS NOTE    [name] BRANAN  Alexander:[id number] DOB: 06-Sep-1933 DOA: 05/18/2016 PCP: [name], MD     Brief Narrative:  [name] Alexander is a 83 y.o. female with medical history significant of CHF EF 45-50% in 06/2015, COPD on 2L home O2, anemia, CKD IV, chronic DVT on warfarin, Afib, and hyperthyroidism previously on methimazole; who presents with complaints of lower extremity swelling for the last 2 weeks. She states her last weight check was over a week ago and at that time she was 117 pounds. However leg swelling symptoms have progressively worsened to the point which she was unable to lift her feet up to step onto the scale 3 days ago. Patient reports being extremely short of breath even taking 10-20 steps from her chair to her potty seat. Normally she ambulates using a walker however has become so weak that she needs significant assistance in doing so at this time. Labs revealed hemoglobin 6.8, BUN 87, creatinine 3.89, INR 1.63, BNP 1299.3, and troponin I 0.07. Chest x-ray showed cardiomegaly with pleural effusions. Patient was given 60 mg of Lasix IV 1 dose and transfuse 1 unit of PRBCs. She was admitted for CHF exacerbation as well as blood loss anemia.   Assessment & Plan:   Principal Problem:   Symptomatic anemia Active Problems:   CKD (chronic kidney disease), stage IV (HCC)   COPD (chronic obstructive pulmonary disease) (HCC)   Permanent atrial fibrillation (HCC)   CHF exacerbation (HCC)   Subtherapeutic international normalized ratio (INR)   GI bleed   Pressure injury of skin   Symptomatic anemia/suspected GI bleed -Baseline hemoglobin normally had been between 8 and 9, however patient presents with hemoglobin 6.8. The last check on record noted that patient have a hemoglobin of 7.7 on 3/31. Stool guaiac was found to be positive -S/p 1u pRBC  -Spoke with patient and son (legal guardian) this morning. Patient is refusing any GI work up including EGD or colonoscopy. She  states that it will be very uncomfortable and is not interested in finding out the source of bleed. They will continue to discuss, but will hold off on GI consult for now  -Hold coumadin for now, continue to trend Hgb   Acute on chronic combined systolic and diastolic CHF exacerbation -Follows with Dr. Royann Shivers. Last echo showing EF of 45-50% in 06/2015. Chest x-ray shows cardiac enlargement with small pleural effusions. BNP elevated at 1299.3. Patient has not been taking Zaroxolyn. Her weight back in March-April has been 113-114lb per Cardiology office note and weight on admission is 120lbs. She is also symptomatically short of breath. Continue IV diuresis for now until we can get weight down to her baseline  -Lasix 60mg  IV BID (takes 80mg  PO QD at home)  -Continue betablocker  -Repeat Echo pending  -Daily weights, I/Os    Chronic kidney disease stage IV -Baseline Cr >4.0 since 10/2015  -Previously seen by nephrology, determined not to be a HD candidate  -Continue to monitor  H/O Hyperthyroidism -Methimazole was not noted on patient's current medication list. Per her last discharge summary and 10/2015 who appears she was still supposed to be on this medication -Currently TSH, T4 within normal limit   Chronic atrial fibrillation -CHADSVASc = 5  -Holding coumadin due to GI bleed   Hx DVT -Last vascular duplex study of the lower extremities noted mixed acute on chronic DVT of the right popliteal vein and left common femoral vein in 03/2015 -Holding coumadin due to GI  bleed  -Doubt she would consent to IVC filter as she has previously refused any cardiac, GI procedures in the past, but we can discuss   COPD/chronic respiratory failure -3L [place] at baseline  -Stable   Generalized weakness -PT to evaluate   GERD -Continue Protonix   DVT prophylaxis: SCD Code Status: Full Family Communication: Son/legal guardian over the phone this morning Disposition Plan: Pending  improvement   Consultants:   None  Procedures:   None  Antimicrobials:   None    Subjective: Patient is very upset this morning because she spilled Jell-O on herself. Per nursing, patient has called staff into a room many times this morning for various complaints. Per son, patient is very specific about her care and she also has high anxiety about being in the hospital. He states that she does not have dementia, but this behavior is normal for her. She states that her legs have been more swollen than usual, which brought her into the hospital. She denies any chest pain, has some mild shortness of breath, no nausea or vomiting. She denies seeing any blood in her stools or black stool.  Objective: Vitals:   05/19/16 0015 05/19/16 0106 05/19/16 0215 05/19/16 0538  BP: 138/75 136/68 140/75 129/63  Pulse: 77 94 80 88  Resp: 19 18 18 17   Temp:  97.7 F (36.5 C) 97.6 F (36.4 C) 98.1 F (36.7 C)  TempSrc:  Oral Oral Oral  SpO2: 97%  100% 100%  Weight:  54.7 kg (120 lb 8 oz)  54.7 kg (120 lb 8 oz)  Height:  5\' 4"  (1.626 m)      Intake/Output Summary (Last 24 hours) at 05/19/16 1048 Last data filed at 05/19/16 0907  Gross per 24 hour  Intake           699.17 ml  Output             1125 ml  Net          -425.83 ml   Filed Weights   05/19/16 0106 05/19/16 0538  Weight: 54.7 kg (120 lb 8 oz) 54.7 kg (120 lb 8 oz)    Examination:  General exam: Appears calm and comfortable  Respiratory system: Clear to auscultation. Respiratory effort normal. Cardiovascular system: S1 & S2 heard, Irregular rhythm. No JVD, + systolic murmur, +2 pedal edema. Gastrointestinal system: Abdomen is nondistended, soft and nontender. No organomegaly or masses felt. Normal bowel sounds heard. Central nervous system: Alert, No focal neurological deficits. Extremities: Symmetric   Data Reviewed: I have personally reviewed following labs and imaging studies  CBC:  Recent Labs Lab 05/18/16 2050  05/19/16 0518  WBC 4.2 4.9  NEUTROABS  --  3.2  HGB 6.8* 8.0*  HCT 21.9* 25.6*  MCV 90.5 89.8  PLT 166 156   Basic Metabolic Panel:  Recent Labs Lab 05/18/16 2050 05/19/16 0306  NA 137 138  K 3.9 4.0  CL 104 104  CO2 20* 21*  GLUCOSE 114* 95  BUN 87* 85*  CREATININE 3.89* 3.65*  CALCIUM 10.1 10.3   GFR: Estimated Creatinine Clearance: 10.6 mL/min (A) (by C-G formula based on SCr of 3.65 mg/dL (H)). Liver Function Tests: No results for input(s): AST, ALT, ALKPHOS, BILITOT, PROT, ALBUMIN in the last 168 hours. No results for input(s): LIPASE, AMYLASE in the last 168 hours. No results for input(s): AMMONIA in the last 168 hours. Coagulation Profile:  Recent Labs Lab 05/18/16 2154  INR 1.63   Cardiac Enzymes:  Recent Labs Lab 05/19/16 0036 05/19/16 0306  TROPONINI 0.05* 0.07*   BNP (last 3 results) No results for input(s): PROBNP in the last 8760 hours. HbA1C: No results for input(s): HGBA1C in the last 72 hours. CBG: No results for input(s): GLUCAP in the last 168 hours. Lipid Profile: No results for input(s): CHOL, HDL, LDLCALC, TRIG, CHOLHDL, LDLDIRECT in the last 72 hours. Thyroid Function Tests:  Recent Labs  05/19/16 0306 05/19/16 0335  TSH 0.793  --   FREET4  --  0.87   Anemia Panel: No results for input(s): VITAMINB12, FOLATE, FERRITIN, TIBC, IRON, RETICCTPCT in the last 72 hours. Sepsis Labs: No results for input(s): PROCALCITON, LATICACIDVEN in the last 168 hours.  No results found for this or any previous visit (from the past 240 hour(s)).     Radiology Studies: Dg Chest Portable 1 View  Result Date: 05/18/2016 CLINICAL DATA:  Tired, shortness of breath, and leg swelling for 2 weeks. History of hypertension, COPD, CHF. EXAM: PORTABLE CHEST 1 VIEW COMPARISON:  11/07/2015 FINDINGS: Cardiac enlargement. No pulmonary vascular congestion or edema. No focal consolidation. Blunting of the costophrenic angles suggesting small pleural effusions.  No pneumothorax. Calcified and tortuous aorta. Degenerative changes in the shoulders. IMPRESSION: Cardiac enlargement. Small pleural effusions. No focal consolidation or edema. Electronically Signed   By: Burman Nieves M.D.   On: 05/18/2016 22:02      Scheduled Meds: . allopurinol  100 mg Oral Daily  . furosemide  60 mg Intravenous BID  . gabapentin  300 mg Oral QHS  . metoprolol tartrate  25 mg Oral BID  . pantoprazole  40 mg Oral Daily  . senna  1 tablet Oral Daily  . sodium chloride flush  3 mL Intravenous Q12H   Continuous Infusions: . sodium chloride    . sodium chloride       LOS: 1 day    Time spent: 45 minutes   Noralee Stain, DO [facility] [email or web address] Password [email or web address] 05/19/2016, 10:48 AM

## 2016-05-20 ENCOUNTER — Inpatient Hospital Stay (HOSPITAL_COMMUNITY): Payer: Medicare Other

## 2016-05-20 DIAGNOSIS — R072 Precordial pain: Secondary | ICD-10-CM

## 2016-05-20 DIAGNOSIS — R2240 Localized swelling, mass and lump, unspecified lower limb: Secondary | ICD-10-CM | POA: Diagnosis not present

## 2016-05-20 DIAGNOSIS — K922 Gastrointestinal hemorrhage, unspecified: Secondary | ICD-10-CM | POA: Diagnosis not present

## 2016-05-20 DIAGNOSIS — D649 Anemia, unspecified: Secondary | ICD-10-CM | POA: Diagnosis not present

## 2016-05-20 LAB — CBC WITH DIFFERENTIAL/PLATELET
Basophils Absolute: 0 10*3/uL (ref 0.0–0.1)
Basophils Relative: 0 %
Eosinophils Absolute: 0.1 10*3/uL (ref 0.0–0.7)
Eosinophils Relative: 3 %
HEMATOCRIT: 24.5 % — AB (ref 36.0–46.0)
HEMOGLOBIN: 7.7 g/dL — AB (ref 12.0–15.0)
LYMPHS ABS: 1.4 10*3/uL (ref 0.7–4.0)
LYMPHS PCT: 31 %
MCH: 28 pg (ref 26.0–34.0)
MCHC: 31.4 g/dL (ref 30.0–36.0)
MCV: 89.1 fL (ref 78.0–100.0)
MONOS PCT: 9 %
Monocytes Absolute: 0.4 10*3/uL (ref 0.1–1.0)
NEUTROS ABS: 2.6 10*3/uL (ref 1.7–7.7)
NEUTROS PCT: 57 %
Platelets: 142 10*3/uL — ABNORMAL LOW (ref 150–400)
RBC: 2.75 MIL/uL — ABNORMAL LOW (ref 3.87–5.11)
RDW: 17 % — ABNORMAL HIGH (ref 11.5–15.5)
WBC: 4.5 10*3/uL (ref 4.0–10.5)

## 2016-05-20 LAB — BASIC METABOLIC PANEL
ANION GAP: 11 (ref 5–15)
BUN: 78 mg/dL — ABNORMAL HIGH (ref 6–20)
CHLORIDE: 104 mmol/L (ref 101–111)
CO2: 24 mmol/L (ref 22–32)
CREATININE: 3.49 mg/dL — AB (ref 0.44–1.00)
Calcium: 10.3 mg/dL (ref 8.9–10.3)
GFR calc non Af Amer: 11 mL/min — ABNORMAL LOW (ref >60)
GFR, EST AFRICAN AMERICAN: 13 mL/min — AB (ref >60)
Glucose, Bld: 96 mg/dL (ref 65–99)
Potassium: 3.4 mmol/L — ABNORMAL LOW (ref 3.5–5.1)
SODIUM: 139 mmol/L (ref 135–145)

## 2016-05-20 LAB — ECHOCARDIOGRAM COMPLETE
HEIGHTINCHES: 64 in
WEIGHTICAEL: 1846.4 [oz_av]

## 2016-05-20 LAB — T3, FREE: T3, Free: 3.3 pg/mL (ref 2.0–4.4)

## 2016-05-20 MED ORDER — METOLAZONE 2.5 MG PO TABS: 2.5000 mg | ORAL_TABLET | ORAL | 0 refills | Status: DC

## 2016-05-20 MED ORDER — POTASSIUM CHLORIDE CRYS ER 20 MEQ PO TBCR
40.0000 meq | EXTENDED_RELEASE_TABLET | Freq: Once | ORAL | Status: AC
  Administered 2016-05-20: 40 meq via ORAL
  Filled 2016-05-20: qty 2

## 2016-05-20 NOTE — Progress Notes (Signed)
  Echocardiogram 2D Echocardiogram has been performed.  Janalyn HarderWest, Liadan Guizar R 05/20/2016, 12:20 PM

## 2016-05-20 NOTE — Care Management Note (Signed)
Case Management Note  Patient Details  Name: [name] Alexander MRN: [id number] Date of Birth: 1933-05-15  Subjective/Objective:                 Spoke with patient and her son, both decline need for further DME, but [facility] as they are familiar with them from [facility]. Referral placed. No further CM needs identified.   Action/Plan: DC to home w HH.  Expected Discharge Date:  05/20/16               Expected Discharge Plan:  Home w Home Health Services  In-House Referral:     Discharge planning Services  CM Consult  Post Acute Care Choice:  Home Health Choice offered to:  Patient, Adult Children  DME Arranged:    DME Agency:     HH Arranged:  PT HH Agency:  [facility]  Status of Service:  Completed, signed off  If discussed at Long Length of Stay Meetings, dates discussed:    Additional Comments:  Lawerance SabalDebbie Ruvi Fullenwider, RN 05/20/2016, 2:54 PM

## 2016-05-20 NOTE — Discharge Summary (Signed)
Physician Discharge Summary  [name] BowenLillie B Alexander ZOX:[id number] DOB: 08/29/1933 DOA: 05/18/2016  PCP: Laurena Slimmerlark, [name], MD  Admit date: 05/18/2016 Discharge date: 05/20/2016  Admitted From: Home Disposition:  Home  Recommendations for Outpatient Follow-up:  1. Follow up with PCP in 1 week 2. Follow up with Cardiology Dr. Royann Shiversroitoru in 1 week  3. Please obtain BMP/CBC in 1 week  4. Stop coumadin due to GI bleeding   Home Health: PT  Equipment/Devices: None   Discharge Condition: Stable CODE STATUS: Full  Diet recommendation: Heart healthy   Brief/Interim Summary: [name] ClarkLillie B Burchis a 83 y.o.femalewith medical history significant of CHF EF 45-50% in 06/2015, COPD on 2L home O2, anemia, CKD IV,chronic DVT on warfarin, Afib,and hyperthyroidism previously on methimazole; who presents with complaints of lower extremity swelling for the last 2 weeks. She states her last weight check was over a week ago and at that time she was 117 pounds. However leg swelling symptoms have progressively worsened to the point which she was unable to lift her feet up to step onto the scale 3 days ago. Patient reports being extremely short of breath even taking 10-20 steps from her chair to her potty seat. Normally she ambulates using a walker however has become so weak that she needs significant assistance in doing so at this time. Labs revealed hemoglobin 6.8, BUN 87, creatinine 3.89, INR 1.63, BNP 1299.3, and troponin I 0.07. Chest x-ray showed cardiomegaly with pleural effusions. Patient was given 60 mg of Lasix IV 1 dose and transfuse 1 unit of PRBCs. She was admitted for CHF exacerbation as well as blood loss anemia.   During her hospitalization, patient as well as legal guardian refused any GI evaluation or procedures to evaluate for etiology of her GI bleed. Hemoglobin remained stable after 1 unit transfusion. She was given IV Lasix with good response. Her weight was down to 115 pounds, which is her baseline.  Echocardiogram was completed with results as below. Creatinine remained stable.  Discharge Diagnoses:  Principal Problem:   Symptomatic anemia Active Problems:   CKD (chronic kidney disease), stage IV (HCC)   COPD (chronic obstructive pulmonary disease) (HCC)   Permanent atrial fibrillation (HCC)   CHF exacerbation (HCC)   Subtherapeutic international normalized ratio (INR)   GI bleed   Pressure injury of skin  Symptomatic anemia/suspected GI bleed -Baseline hemoglobin normally had been between 8 and 9, however patient presents with hemoglobin 6.8. The last check on record noted that patient have a hemoglobin of 7.7 on 3/31. Stool guaiac was found to be positive -S/p 1u pRBC  -Spoke with patient and son (legal guardian). Patient is refusing any GI work up including EGD or colonoscopy. She states that it will be very uncomfortable and is not interested in finding out the source of bleed. They both agree on holding off on any GI consultation or procedures to evaluate her GI blood loss, even though this may mean that she continues to bleed in the future. -We will have to stop Coumadin indefinitely due to GI bleed and anemia. Explained benefit and risk and family understands.   Acute on chronic combined systolic and diastolic CHF exacerbation -Follows with Dr. Royann Shiversroitoru. Last echo showing EF of 45-50% in 06/2015.Chest x-ray shows cardiac enlargement with small pleural effusions. BNP elevated at 1299.3. Patient has not been taking Zaroxolyn. Her weight back in March-April has been 113-114lb per Cardiology office note and weight on admission is 120lbs. She is also symptomatically short of breath. -Improved today, weight  down to 115 pounds -Resume home Lasix and Zaroxolyn -Continue betablocker  -Repeat Echo as below, similar to previous  -Daily weights, I/Os   Chronic kidney disease stage IV -Baseline Cr >4.0 since 10/2015  -Previously seen by nephrology, determined not to be a HD candidate   -Continueto monitor  H/O Hyperthyroidism -Methimazole was not noted on patient's current medication list. Per her last discharge summary and 10/2015 who appears she was still supposed to be on this medication -Currently TSH, T4 within normal limit   Chronicatrial fibrillation -CHADSVASc = 5 -Holding coumadin due to GI bleed   Hx DVT -Last vascular duplex study of the lower extremities noted mixed acute on chronic DVT of the right popliteal vein and left common femoral veinin 03/2015 -Holding coumadin due to GI bleed   COPD/chronic respiratory failure -3L [employer] at baseline  -Stable   Generalized weakness -PT to evaluate, HHPT   GERD -Continue Protonix   Discharge Instructions  Discharge Instructions    (HEART FAILURE PATIENTS) Call MD:  Anytime you have any of the following symptoms: 1) 3 pound weight gain in 24 hours or 5 pounds in 1 week 2) shortness of breath, with or without a dry hacking cough 3) swelling in the hands, feet or stomach 4) if you have to sleep on extra pillows at night in order to breathe.    Complete by:  As directed    Call MD for:  difficulty breathing, headache or visual disturbances    Complete by:  As directed    Call MD for:  extreme fatigue    Complete by:  As directed    Call MD for:  persistant dizziness or light-headedness    Complete by:  As directed    Call MD for:  persistant nausea and vomiting    Complete by:  As directed    Call MD for:  severe uncontrolled pain    Complete by:  As directed    Call MD for:  temperature >100.4    Complete by:  As directed    Diet - low sodium heart healthy    Complete by:  As directed    Discharge instructions    Complete by:  As directed    You were cared for by a hospitalist during your hospital stay. If you have any questions about your discharge medications or the care you received while you were in the hospital after you are discharged, you can call the unit and asked to speak with the  hospitalist on call if the hospitalist that took care of you is not available. Once you are discharged, your primary care physician will handle any further medical issues. Please note that NO REFILLS for any discharge medications will be authorized once you are discharged, as it is imperative that you return to your primary care physician (or establish a relationship with a primary care physician if you do not have one) for your aftercare needs so that they can reassess your need for medications and monitor your lab values.   Increase activity slowly    Complete by:  As directed      Allergies as of 05/20/2016   No Known Allergies     Medication List    STOP taking these medications   warfarin 1 MG tablet Commonly known as:  COUMADIN     TAKE these medications   allopurinol 100 MG tablet Commonly known as:  ZYLOPRIM Take 1 tablet (100 mg total) by mouth every morning. What changed:  when to take this   feeding supplement (ENSURE COMPLETE) Liqd Take 237 mLs by mouth 2 (two) times daily between meals.   furosemide 20 MG tablet Commonly known as:  LASIX Take 80 mg by mouth daily.   gabapentin 300 MG capsule Commonly known as:  NEURONTIN Take 300 mg by mouth at bedtime.   metolazone 2.5 MG tablet Commonly known as:  ZAROXOLYN Take 1 tablet (2.5 mg total) by mouth as directed.   metoprolol tartrate 25 MG tablet Commonly known as:  LOPRESSOR Take 1 tablet (25 mg total) by mouth 2 (two) times daily.   OXYGEN Inhale 2 L into the lungs continuous.   pantoprazole 40 MG tablet Commonly known as:  PROTONIX Take 1 tablet (40 mg total) by mouth daily.   senna 8.6 MG Tabs tablet Commonly known as:  SENOKOT Take 1 tablet (8.6 mg total) by mouth daily.   traMADol 50 MG tablet Commonly known as:  ULTRAM TAKE 1 TABLET BY MOUTH TWICE A DAY AS NEEDED FOR PAIN      Follow-up Information    [name], MD. Schedule an appointment as soon as possible for a visit in 1 week(s).    Specialty:  Internal Medicine Contact information: [address] [place] [address] [phone]        Croitoru, Rachelle Hora, MD. Schedule an appointment as soon as possible for a visit in 1 week(s).   Specialty:  Cardiology Contact information: [address] [place] [address] [phone]          No Known Allergies  Consultations:  None   Procedures/Studies: Dg Chest Portable 1 View  Result Date: 05/18/2016 CLINICAL DATA:  Tired, shortness of breath, and leg swelling for 2 weeks. History of hypertension, COPD, CHF. EXAM: PORTABLE CHEST 1 VIEW COMPARISON:  11/07/2015 FINDINGS: Cardiac enlargement. No pulmonary vascular congestion or edema. No focal consolidation. Blunting of the costophrenic angles suggesting small pleural effusions. No pneumothorax. Calcified and tortuous aorta. Degenerative changes in the shoulders. IMPRESSION: Cardiac enlargement. Small pleural effusions. No focal consolidation or edema. Electronically Signed   By: Burman Nieves M.D.   On: 05/18/2016 22:02    Echo  Study Conclusions  - Left ventricle: The cavity size was normal. Wall thickness was   increased in a pattern of mild LVH. Systolic function was mildly   reduced. The estimated ejection fraction was in the range of 45%   to 50%. Diffuse hypokinesis. - Mitral valve: Mild prolapse, involving the anterior leaflet.   There was moderate to severe regurgitation directed posteriorly. - Left atrium: The atrium was severely dilated. - Tricuspid valve: There was moderate regurgitation. - Pulmonary arteries: Systolic pressure was mildly increased. PA   peak pressure: 34 mm Hg (S).  Impressions:  - No significant change from prior study. MR remains severe with   decreased EF. Valvular cardiomyopathy.   Discharge Exam: Vitals:   05/20/16 0500 05/20/16 0812  BP: (!) 117/52 108/63  Pulse: 62 84  Resp: 18   Temp: 98.5 F (36.9 C)    Vitals:    05/19/16 0538 05/19/16 2034 05/20/16 0500 05/20/16 0812  BP: 129/63 132/66 (!) 117/52 108/63  Pulse: 88 79 62 84  Resp: 17 17 18    Temp: 98.1 F (36.7 C) 98.5 F (36.9 C) 98.5 F (36.9 C)   TempSrc: Oral Oral Oral   SpO2: 100% 100% 100%   Weight: 54.7 kg (120 lb 8 oz)  52.3 kg (115 lb 6.4 oz)  Height:        General: Pt is alert, awake, not in acute distress Cardiovascular: Irreg rhythm, S1/S2 +, +systolic murmur, no rubs, no gallops, trace edema much improved from admission  Respiratory: CTA bilaterally, no wheezing, no rhonchi Abdominal: Soft, NT, ND, bowel sounds + Extremities: trace edema, no cyanosis    The results of significant diagnostics from this hospitalization (including imaging, microbiology, ancillary and laboratory) are listed below for reference.     Microbiology: No results found for this or any previous visit (from the past 240 hour(s)).   Labs: BNP (last 3 results)  Recent Labs  09/28/15 1145 11/10/15 1612 05/18/16 2051  BNP 625.4* 447.8* 1,299.3*   Basic Metabolic Panel:  Recent Labs Lab 05/18/16 2050 05/19/16 0306 05/20/16 0251  NA 137 138 139  K 3.9 4.0 3.4*  CL 104 104 104  CO2 20* 21* 24  GLUCOSE 114* 95 96  BUN 87* 85* 78*  CREATININE 3.89* 3.65* 3.49*  CALCIUM 10.1 10.3 10.3   Liver Function Tests: No results for input(s): AST, ALT, ALKPHOS, BILITOT, PROT, ALBUMIN in the last 168 hours. No results for input(s): LIPASE, AMYLASE in the last 168 hours. No results for input(s): AMMONIA in the last 168 hours. CBC:  Recent Labs Lab 05/18/16 2050 05/19/16 0518 05/20/16 0251  WBC 4.2 4.9 4.5  NEUTROABS  --  3.2 2.6  HGB 6.8* 8.0* 7.7*  HCT 21.9* 25.6* 24.5*  MCV 90.5 89.8 89.1  PLT 166 156 142*   Cardiac Enzymes:  Recent Labs Lab 05/19/16 0036 05/19/16 0306 05/19/16 1107  TROPONINI 0.05* 0.07* 0.06*   BNP: Invalid input(s): POCBNP CBG: No results for input(s): GLUCAP in the last 168 hours. D-Dimer No results for  input(s): DDIMER in the last 72 hours. Hgb A1c No results for input(s): HGBA1C in the last 72 hours. Lipid Profile No results for input(s): CHOL, HDL, LDLCALC, TRIG, CHOLHDL, LDLDIRECT in the last 72 hours. Thyroid function studies  Recent Labs  05/19/16 0306  TSH 0.793  T3FREE 3.3   Anemia work up No results for input(s): VITAMINB12, FOLATE, FERRITIN, TIBC, IRON, RETICCTPCT in the last 72 hours. Urinalysis    Component Value Date/Time   COLORURINE YELLOW 11/08/2015 0518   APPEARANCEUR CLEAR 11/08/2015 0518   LABSPEC 1.010 11/08/2015 0518   PHURINE 7.5 11/08/2015 0518   GLUCOSEU NEGATIVE 11/08/2015 0518   HGBUR SMALL (A) 11/08/2015 0518   BILIRUBINUR NEGATIVE 11/08/2015 0518   KETONESUR NEGATIVE 11/08/2015 0518   PROTEINUR NEGATIVE 11/08/2015 0518   UROBILINOGEN 0.2 12/12/2013 0414   NITRITE NEGATIVE 11/08/2015 0518   LEUKOCYTESUR NEGATIVE 11/08/2015 0518   Sepsis Labs Invalid input(s): PROCALCITONIN,  WBC,  LACTICIDVEN Microbiology No results found for this or any previous visit (from the past 240 hour(s)).   Time coordinating discharge: 45 minutes  SIGNED:  Noralee Stain, DO [facility] Pager [phone]  If 7PM-7AM, please contact night-coverage [email or web address] Password [email or web address] 05/20/2016, 2:15 PM

## 2016-05-25 ENCOUNTER — Telehealth: Payer: Self-pay | Admitting: Cardiovascular Disease

## 2016-05-25 NOTE — Telephone Encounter (Signed)
New Message  Pt son call requesting to speak with RN about getting a soon hospital f/u appt than next available and current appt. Please call back to discuss

## 2016-05-25 NOTE — Telephone Encounter (Signed)
No answer, goes to VM. I have left Mr. [name] Alexander msg to call.

## 2016-05-26 NOTE — Telephone Encounter (Signed)
[name] Alexander, Otho BellowsShawnee I  Kyden Potash M, RN         I have this added.   Previous Messages    ----- Message -----  From: Lindell SparElkins, Awilda Covin M, RN  Sent: 05/26/2016  8:37 AM  To: [facility]  Subject: add to cancellation list for sooner appt     Patient's son would like patient to f/up from hospital sooner than June 8. If there are cancellations w/APPs this week or next, please call (if possible) to see if she can get in sooner. Thanks!

## 2016-05-26 NOTE — Telephone Encounter (Signed)
Returned call to patient's son on DPR He would like his mom to have sooner hospital f/up that June 8 w/Dr. C Advised that no sooner appts w/MD are available He would like to keep this appt and see if they can be notified if there is a cancellation Message sent to scheduler

## 2016-06-06 DIAGNOSIS — I340 Nonrheumatic mitral (valve) insufficiency: Secondary | ICD-10-CM | POA: Insufficient documentation

## 2016-06-06 NOTE — Progress Notes (Signed)
Patient ID: [name] Alexander, female   DOB: 1933-01-18, 84 y.o.   MRN: [id number]    Cardiology Office Note    Date:  06/18/2016   ID:  [name] Alexander, DOB 1933-10-20, MRN [id number]  PCP:  [name], MD  Cardiologist:   Thurmon Fair, MD   Chief Complaint  Patient presents with  . Follow-up    History of Present Illness:  [name] Alexander is a 83 y.o. female with persistent atrial fibrillation, moderate mitral insufficiency, mildly depressed left ventricular systolic function (45%), combined systolic and diastolic heart failure, suspected CAD (coronary calcifications on CT, inferior wall motion abnormality), severe COPD on chronic home O2 (3 L/min), stage IV chronic kidney disease, history of bilateral lower extremity DVT on chronic warfarin anticoagulation, malnutrition/underweight, returning for follow-up.  In March she presented with acute HF exacerbation,  Roughly 13 pounds above her estimated dry weight of 100 lb.  Metolazone was added to furosemide.   Despite diuretics she continued to developed worsening edema. On May 8 she was hospitalized with severe weakness and anemia with a hemoglobin down to 6. Warfarin was stopped due to GI bleed. She was given 1 unit of PRBC and intravenous diuretics.  On admission she weighed 120 pounds, at discharge 115 pounds.  Creatinine at discharge was 3.49, hemoglobin 7. Echo during that hospitalization showed  LVEF 45-50%, diffuse hypokinesis, mild LVH, moderate to severe eccentric mitral insufficiency due to anterior leaflet prolapse, severely dilated left atrium.  She has functional class IIIb-IV dyspnea - she always sleeps in a recliner, but this is her current baseline. She has relatively mild pedal/ leg edema. She denies angina pectoris, dizziness or syncope and does not feel palpitations. There has been no further overt bleeding since returned home  Will be celebrating her 83st birthday next week.  Past Medical History:  Diagnosis Date  .  Arthritis    "hips, knees, shoulders" (07/31/2015)  . Chronic diastolic CHF (congestive heart failure), NYHA class 2 (HCC)   . CKD (chronic kidney disease), stage IV (HCC)   . COPD (chronic obstructive pulmonary disease) (HCC)   . DVT (deep venous thrombosis) (HCC) "2-3 times"  . GERD (gastroesophageal reflux disease)   . Gout   . Hypertension   . Hyperthyroidism   . On home oxygen therapy    "2L; 24/7" (07/31/2015)  . Pneumonia   . Thyroid disease     Past Surgical History:  Procedure Laterality Date  . CATARACT EXTRACTION W/ INTRAOCULAR LENS  IMPLANT, BILATERAL Bilateral   . JOINT REPLACEMENT    . TOTAL KNEE ARTHROPLASTY Right 06/2004   [name] 05/26/2010  . VAGINAL HYSTERECTOMY      Current Medications: Outpatient Medications Prior to Visit  Medication Sig Dispense Refill  . allopurinol (ZYLOPRIM) 100 MG tablet Take 1 tablet (100 mg total) by mouth every morning. (Patient taking differently: Take 100 mg by mouth daily. ) 10 tablet 0  . feeding supplement, ENSURE COMPLETE, (ENSURE COMPLETE) LIQD Take 237 mLs by mouth 2 (two) times daily between meals. 60 Bottle 0  . furosemide (LASIX) 20 MG tablet Take 80 mg by mouth daily.    Marland Kitchen gabapentin (NEURONTIN) 300 MG capsule Take 300 mg by mouth at bedtime.    . metoprolol tartrate (LOPRESSOR) 25 MG tablet Take 1 tablet (25 mg total) by mouth 2 (two) times daily. 60 tablet 0  . OXYGEN Inhale 2 L into the lungs continuous.    . pantoprazole (PROTONIX) 40 MG tablet Take 1 tablet (  40 mg total) by mouth daily. 10 tablet 0  . senna (SENOKOT) 8.6 MG TABS tablet Take 1 tablet (8.6 mg total) by mouth daily. 120 each 0  . traMADol (ULTRAM) 50 MG tablet TAKE 1 TABLET BY MOUTH TWICE A DAY AS NEEDED FOR PAIN 60 tablet 2  . metolazone (ZAROXOLYN) 2.5 MG tablet Take 1 tablet (2.5 mg total) by mouth as directed. 30 tablet 0   No facility-administered medications prior to visit.      Allergies:   Patient has no known allergies.   Social History    Social History  . Marital status: Widowed    Spouse name: N/A  . Number of children: N/A  . Years of education: N/A   Occupational History  . Retired    Social History Main Topics  . Smoking status: Former Smoker    Packs/day: 2.00    Years: 50.00    Types: Cigarettes    Quit date: 03/12/2015  . Smokeless tobacco: Never Used  . Alcohol use No  . Drug use: No  . Sexual activity: Yes   Other Topics Concern  . Not on file   Social History Narrative   Good family support.     Family History:  The patient reports that he does not think there is any family history of coronary disease or stroke or arrhythmia. She has lost 2 children, neither one of them to heart disease..  ROS:   Please see the history of present illness.    ROS All other systems reviewed and are negative.   PHYSICAL EXAM:   VS:  BP 108/60 (BP Location: Left Arm, Patient Position: Sitting, Cuff Size: Small)   Pulse 76   Ht 5\' 4"  (1.626 m)   Wt 110 lb (49.9 kg)   BMI 18.88 kg/m    Wearing oxygen by nasal cannula Very thin and frail. General: Alert, oriented x3, no distress Head: no evidence of trauma, PERRL, EOMI, no exophtalmos or lid lag, no myxedema, no xanthelasma; normal ears, nose and oropharynx Neck: normal jugular venous pulsations and no hepatojugular reflux; brisk carotid pulses without delay and no carotid bruits Chest: clear to auscultation, no signs of consolidation by percussion or palpation, normal fremitus, symmetrical and full respiratory excursions Cardiovascular: irregular; apical and left lower sternal border 2/6 holosystolic murmur,  No diastolic murmurs,rubs or gallops, 1-2+ pedal edema with swelling just above the ankle bilaterally Abdomen: no tenderness or distention, no masses by palpation, no abnormal pulsatility or arterial bruits, normal bowel sounds, no hepatosplenomegaly Extremities: no clubbing, cyanosis or edema; 2+ radial, ulnar and brachial pulses bilaterally; 2+ right  femoral, posterior tibial and dorsalis pedis pulses; 2+ left femoral, posterior tibial and dorsalis pedis pulses; no subclavian or femoral bruits Neurological: grossly nonfocal Psych: euthymic mood, full affect, but much quieter than usual  Wt Readings from Last 3 Encounters:  06/18/16 110 lb (49.9 kg)  05/20/16 115 lb 6.4 oz (52.3 kg)  04/13/16 114 lb 3.2 oz (51.8 kg)      Studies/Labs Reviewed:   EKG:  EKG is not ordered today.    Recent Labs: 11/08/2015: ALT 9 11/12/2015: Magnesium 2.3 05/18/2016: B Natriuretic Peptide 1,299.3 05/19/2016: TSH 0.793 05/20/2016: BUN 78; Creatinine, Ser 3.49; Hemoglobin 7.7; Platelets 142; Potassium 3.4; Sodium 139    ASSESSMENT:    1. Chronic combined systolic and diastolic CHF (congestive heart failure) (HCC)   2. Severe mitral insufficiency   3. Permanent atrial fibrillation (HCC)   4. Atherosclerosis of native coronary artery  of native heart without angina pectoris   5. Chronic respiratory failure with hypoxia (HCC)   6. Essential hypertension   7. CKD (chronic kidney disease), stage IV (HCC)   8. Symptomatic anemia   9. Protein-calorie malnutrition, severe      PLAN:  In order of problems listed above:  1. CHF: Recent decompensation related to severe anemia. Hard to weigh at home, but this would be highly desirable. Today she weighs less than she did at hospital discharge; will check renal function/electrolytes as well as hemoglobin today; adjust the diuretic dose try to keep her weight under 113 pounds. Meanwhile, the current doses of diuretics seem to be working reasonably well. Might try to reduce the frequency of metolazone dosing, especially for potassium level is low. 2. MR:  This is at least partly responsible for her heart failure. Not a surgical candidate. 3. Atrial fibrillation: Good rate control.  Anticoagulation stopped due to severe anemia and GI bleeding. I don't think we will ever restart anticoagulation in this elderly and  frail patient. Unfortunately, this exposes her to the risk of arterial embolism/stroke as well as venous thromboembolism (she has previously had DVT). 4. CAD: Does not have angina pectoris.Coronary artery disease is suspected based on the presence of coronary artery calcifications on CT chest, extensive evidence of peripheral arterial disease by imaging studies, suggestion of old inferior myocardial infarction by EKG changes and presence of wall motion abnormalities.  Despite this, she is a poor candidate for either surgical or percutaneous revascularization de to renal and pulmonary disease and problems with anemia while on anticoagulation. Medical management as prescribed. 5. COPD: on continuous home O2. No longer smoking. Avoid long-acting bronchodilators such as Symbicort. OK to use short acting albuterol "prn" as rescue inhaler.  6. HTN: Blood pressure is normal.  Metoprolol is primarily necessary for ventricular rate control 7. CKD stage IV: Avoid iodine contrast based procedures. I think the focus should be on improving her breathing symptoms and with not hesitate to increase her diuretics for hypervolemia, even if this means worsening renal function. Need to keep an eye on her electrolytes however. 8. Anemia  Multifactorial, related to GI blood loss and renal insufficiency. Recheck hemoglobin today 9. She is still underweight..     Medication Adjustments/Labs and Tests Ordered: Current medicines are reviewed at length with the patient today.  Concerns regarding medicines are outlined above.  Medication changes, Labs and Tests ordered today are listed in the Patient Instructions below. Patient Instructions  Dr Royann Shivers recommends that you continue on your current medications as directed. Please refer to the Current Medication list given to you today.  Your physician recommends that you return for lab work TODAY.  Dr Royann Shivers recommends that you schedule a follow-up appointment in 3  months.  If you need a refill on your cardiac medications before your next appointment, please call your pharmacy.    Signed, Thurmon Fair, MD  06/18/2016 3:00 PM    [facility] [address] [place], [place], [place]  [address] Phone: [phone]; Fax: [phone]

## 2016-06-09 ENCOUNTER — Other Ambulatory Visit (INDEPENDENT_AMBULATORY_CARE_PROVIDER_SITE_OTHER): Payer: Self-pay | Admitting: Orthopaedic Surgery

## 2016-06-09 NOTE — Telephone Encounter (Signed)
Please advise 

## 2016-06-10 ENCOUNTER — Telehealth (INDEPENDENT_AMBULATORY_CARE_PROVIDER_SITE_OTHER): Payer: Self-pay | Admitting: Orthopaedic Surgery

## 2016-06-10 NOTE — Telephone Encounter (Signed)
rx approved by dr Roda Shuttersxu he accepted it on the other msg

## 2016-06-10 NOTE — Telephone Encounter (Signed)
Patient's son [name]([place]) called advised his mother need Rx refilled (Tramadol) The number to contact patient is [phone]

## 2016-06-10 NOTE — Telephone Encounter (Signed)
Ok  #30 

## 2016-06-10 NOTE — Telephone Encounter (Signed)
Please advise 

## 2016-06-14 ENCOUNTER — Telehealth (INDEPENDENT_AMBULATORY_CARE_PROVIDER_SITE_OTHER): Payer: Self-pay

## 2016-06-14 NOTE — Telephone Encounter (Signed)
Called Son back. He had left a voicemail on Friday PM and I was out of the office. He was checking for his mothers RX which Dr Roda ShuttersXu approved since 5/30. He states there was nothing at the pharm. I called Rx into pharm today. Should be ready for pick up in about 45 mins at the pharm.

## 2016-06-18 ENCOUNTER — Ambulatory Visit (INDEPENDENT_AMBULATORY_CARE_PROVIDER_SITE_OTHER): Payer: Medicare Other | Admitting: Cardiovascular Disease

## 2016-06-18 ENCOUNTER — Encounter: Payer: Self-pay | Admitting: Cardiovascular Disease

## 2016-06-18 VITALS — BP 108/60 | HR 76 | Ht 64.0 in | Wt 110.0 lb

## 2016-06-18 DIAGNOSIS — D649 Anemia, unspecified: Secondary | ICD-10-CM

## 2016-06-18 DIAGNOSIS — N184 Chronic kidney disease, stage 4 (severe): Secondary | ICD-10-CM

## 2016-06-18 DIAGNOSIS — I2510 Atherosclerotic heart disease of native coronary artery without angina pectoris: Secondary | ICD-10-CM | POA: Diagnosis not present

## 2016-06-18 DIAGNOSIS — E43 Unspecified severe protein-calorie malnutrition: Secondary | ICD-10-CM

## 2016-06-18 DIAGNOSIS — I10 Essential (primary) hypertension: Secondary | ICD-10-CM

## 2016-06-18 DIAGNOSIS — J9611 Chronic respiratory failure with hypoxia: Secondary | ICD-10-CM

## 2016-06-18 DIAGNOSIS — I482 Chronic atrial fibrillation: Secondary | ICD-10-CM | POA: Diagnosis not present

## 2016-06-18 DIAGNOSIS — I340 Nonrheumatic mitral (valve) insufficiency: Secondary | ICD-10-CM

## 2016-06-18 DIAGNOSIS — I5042 Chronic combined systolic (congestive) and diastolic (congestive) heart failure: Secondary | ICD-10-CM | POA: Diagnosis not present

## 2016-06-18 DIAGNOSIS — I4821 Permanent atrial fibrillation: Secondary | ICD-10-CM

## 2016-06-18 MED ORDER — METOLAZONE 2.5 MG PO TABS: 2.5000 mg | ORAL_TABLET | ORAL | 1 refills | Status: AC

## 2016-06-18 NOTE — Patient Instructions (Signed)
Dr Croitoru recommends that you continue on your current medications as directed. Please refer to the Current Medication list given to you today.  Your physician recommends that you return for lab work TODAY.  Dr Croitoru recommends that you schedule a follow-up appointment in 3 months.  If you need a refill on your cardiac medications before your next appointment, please call your pharmacy. 

## 2016-06-19 LAB — BASIC METABOLIC PANEL
BUN / CREAT RATIO: 26 (ref 12–28)
BUN: 132 mg/dL — AB (ref 8–27)
CHLORIDE: 94 mmol/L — AB (ref 96–106)
CO2: 22 mmol/L (ref 18–29)
Calcium: 10 mg/dL (ref 8.7–10.3)
Creatinine, Ser: 5.11 mg/dL — ABNORMAL HIGH (ref 0.57–1.00)
GFR calc Af Amer: 9 mL/min/{1.73_m2} — ABNORMAL LOW (ref >59)
GFR calc non Af Amer: 7 mL/min/{1.73_m2} — ABNORMAL LOW (ref >59)
GLUCOSE: 92 mg/dL (ref 65–99)
Potassium: 3.7 mmol/L (ref 3.5–5.2)
SODIUM: 138 mmol/L (ref 134–144)

## 2016-06-19 LAB — CBC
Hematocrit: 26.6 % — ABNORMAL LOW (ref 34.0–46.6)
Hemoglobin: 8.3 g/dL — ABNORMAL LOW (ref 11.1–15.9)
MCH: 29.2 pg (ref 26.6–33.0)
MCHC: 31.2 g/dL — AB (ref 31.5–35.7)
MCV: 94 fL (ref 79–97)
Platelets: 104 10*3/uL — ABNORMAL LOW (ref 150–379)
RBC: 2.84 x10E6/uL — ABNORMAL LOW (ref 3.77–5.28)
RDW: 20.1 % — AB (ref 12.3–15.4)
WBC: 5.8 10*3/uL (ref 3.4–10.8)

## 2016-06-19 LAB — MAGNESIUM: MAGNESIUM: 2.2 mg/dL (ref 1.6–2.3)

## 2016-06-23 ENCOUNTER — Telehealth: Payer: Self-pay | Admitting: *Deleted

## 2016-06-23 ENCOUNTER — Observation Stay (HOSPITAL_COMMUNITY)
Admission: EM | Admit: 2016-06-23 | Discharge: 2016-06-24 | Disposition: A | Discharge status: Home / Self Care | Payer: Medicare Other | Attending: Internal Medicine | Admitting: Internal Medicine

## 2016-06-23 ENCOUNTER — Encounter (HOSPITAL_COMMUNITY): Payer: Self-pay | Admitting: Emergency Medicine

## 2016-06-23 DIAGNOSIS — E0590 Thyrotoxicosis, unspecified without thyrotoxic crisis or storm: Secondary | ICD-10-CM | POA: Diagnosis present

## 2016-06-23 DIAGNOSIS — J449 Chronic obstructive pulmonary disease, unspecified: Secondary | ICD-10-CM | POA: Insufficient documentation

## 2016-06-23 DIAGNOSIS — N19 Unspecified kidney failure: Secondary | ICD-10-CM

## 2016-06-23 DIAGNOSIS — R7989 Other specified abnormal findings of blood chemistry: Secondary | ICD-10-CM

## 2016-06-23 DIAGNOSIS — E43 Unspecified severe protein-calorie malnutrition: Secondary | ICD-10-CM | POA: Diagnosis present

## 2016-06-23 DIAGNOSIS — I5042 Chronic combined systolic (congestive) and diastolic (congestive) heart failure: Secondary | ICD-10-CM | POA: Insufficient documentation

## 2016-06-23 DIAGNOSIS — I132 Hypertensive heart and chronic kidney disease with heart failure and with stage 5 chronic kidney disease, or end stage renal disease: Secondary | ICD-10-CM | POA: Diagnosis not present

## 2016-06-23 DIAGNOSIS — I2510 Atherosclerotic heart disease of native coronary artery without angina pectoris: Secondary | ICD-10-CM | POA: Insufficient documentation

## 2016-06-23 DIAGNOSIS — R630 Anorexia: Secondary | ICD-10-CM | POA: Diagnosis not present

## 2016-06-23 DIAGNOSIS — I10 Essential (primary) hypertension: Secondary | ICD-10-CM | POA: Diagnosis present

## 2016-06-23 DIAGNOSIS — R778 Other specified abnormalities of plasma proteins: Secondary | ICD-10-CM

## 2016-06-23 DIAGNOSIS — Z992 Dependence on renal dialysis: Secondary | ICD-10-CM | POA: Diagnosis not present

## 2016-06-23 DIAGNOSIS — D649 Anemia, unspecified: Secondary | ICD-10-CM | POA: Diagnosis present

## 2016-06-23 DIAGNOSIS — I340 Nonrheumatic mitral (valve) insufficiency: Secondary | ICD-10-CM | POA: Diagnosis present

## 2016-06-23 DIAGNOSIS — R5383 Other fatigue: Secondary | ICD-10-CM | POA: Diagnosis present

## 2016-06-23 DIAGNOSIS — I4821 Permanent atrial fibrillation: Secondary | ICD-10-CM | POA: Diagnosis present

## 2016-06-23 DIAGNOSIS — Z87891 Personal history of nicotine dependence: Secondary | ICD-10-CM | POA: Diagnosis not present

## 2016-06-23 DIAGNOSIS — Z96651 Presence of right artificial knee joint: Secondary | ICD-10-CM | POA: Insufficient documentation

## 2016-06-23 DIAGNOSIS — E876 Hypokalemia: Secondary | ICD-10-CM | POA: Diagnosis present

## 2016-06-23 DIAGNOSIS — N179 Acute kidney failure, unspecified: Secondary | ICD-10-CM | POA: Diagnosis present

## 2016-06-23 DIAGNOSIS — J9611 Chronic respiratory failure with hypoxia: Secondary | ICD-10-CM | POA: Diagnosis present

## 2016-06-23 DIAGNOSIS — N186 End stage renal disease: Secondary | ICD-10-CM | POA: Insufficient documentation

## 2016-06-23 DIAGNOSIS — N189 Chronic kidney disease, unspecified: Secondary | ICD-10-CM

## 2016-06-23 LAB — CBC
HCT: 24.9 % — ABNORMAL LOW (ref 36.0–46.0)
HEMOGLOBIN: 7.7 g/dL — AB (ref 12.0–15.0)
MCH: 29.2 pg (ref 26.0–34.0)
MCHC: 30.9 g/dL (ref 30.0–36.0)
MCV: 94.3 fL (ref 78.0–100.0)
Platelets: 109 10*3/uL — ABNORMAL LOW (ref 150–400)
RBC: 2.64 MIL/uL — AB (ref 3.87–5.11)
RDW: 19.4 % — ABNORMAL HIGH (ref 11.5–15.5)
WBC: 3.9 10*3/uL — AB (ref 4.0–10.5)

## 2016-06-23 LAB — URINALYSIS, ROUTINE W REFLEX MICROSCOPIC
BILIRUBIN URINE: NEGATIVE
Glucose, UA: NEGATIVE mg/dL
HGB URINE DIPSTICK: NEGATIVE
Ketones, ur: NEGATIVE mg/dL
NITRITE: NEGATIVE
Protein, ur: NEGATIVE mg/dL
SPECIFIC GRAVITY, URINE: 1.009 (ref 1.005–1.030)
pH: 6 (ref 5.0–8.0)

## 2016-06-23 LAB — BASIC METABOLIC PANEL
Anion gap: 14 (ref 5–15)
BUN: 144 mg/dL — ABNORMAL HIGH (ref 6–20)
CALCIUM: 9.9 mg/dL (ref 8.9–10.3)
CO2: 26 mmol/L (ref 22–32)
Chloride: 96 mmol/L — ABNORMAL LOW (ref 101–111)
Creatinine, Ser: 4.82 mg/dL — ABNORMAL HIGH (ref 0.44–1.00)
GFR, EST AFRICAN AMERICAN: 9 mL/min — AB (ref >60)
GFR, EST NON AFRICAN AMERICAN: 8 mL/min — AB (ref >60)
GLUCOSE: 97 mg/dL (ref 65–99)
Potassium: 3 mmol/L — ABNORMAL LOW (ref 3.5–5.1)
SODIUM: 136 mmol/L (ref 135–145)

## 2016-06-23 LAB — CBG MONITORING, ED: Glucose-Capillary: 84 mg/dL (ref 65–99)

## 2016-06-23 MED ORDER — SODIUM CHLORIDE 0.9 % IV BOLUS (SEPSIS)
500.0000 mL | Freq: Once | INTRAVENOUS | Status: AC
  Administered 2016-06-24: 500 mL via INTRAVENOUS

## 2016-06-23 MED ORDER — ACETAMINOPHEN 325 MG PO TABS
650.0000 mg | ORAL_TABLET | Freq: Once | ORAL | Status: AC
  Administered 2016-06-24: 650 mg via ORAL
  Filled 2016-06-23: qty 2

## 2016-06-23 NOTE — Telephone Encounter (Signed)
Left message for pt to call, due to recent lab results and elevated kidney function per dr hilty the patient will need to go to the ER.

## 2016-06-23 NOTE — ED Provider Notes (Signed)
[facility]EMER[facility]ENCY DEPT Provider Note   CSN: [id number] Arrival date & time: 06/23/16  1859  By signing my name below, I, Modena JanskyAlbert Thayil, attest that this documentation has been prepared under the direction and in the presence of Alvira MondaySchlossman, Londynn Sonoda, MD. Electronically Signed: Modena JanskyAlbert Thayil, Scribe. 06/23/2016. 11:34 PM.  History   Chief Complaint No chief complaint on file.  The history is provided by the patient and a relative. No language interpreter was used.   HPI Comments: [name] Alexander is a 83 y.o. female with a PMHx of CKD IV who presents to the Emergency Department for an abnormal lab follow-up. Per nurse's note, she was sent here by Cardiologist due to BUN: 132 and Creat: 5.11 results. She was seen for associated fatigue and itching to her back in the past 2 weeks after starting physical therapy. She also had decreased appetite in the past week. She has had prior hx of itching that resolved on its own. She already discussed starting dialysis and came to the decision not to start. She has had constipation secondary to recent Lasix. She takes Tylenol, but had no recent NSAIDs.   She also complains of right knee pain secondary to arthritis. Denies any recent Coumadin use, recent iron supplement use, fever, cough, chest pain, SOB, nausea, vomiting, blood in stool, melena, dysuria, known rash, or other complaints at this time. Reports significant fatigue.  Past Medical History:  Diagnosis Date  . Arthritis    "hips, knees, shoulders" (07/31/2015)  . Chronic diastolic CHF (congestive heart failure), NYHA class 2 (HCC)   . CKD (chronic kidney disease), stage IV (HCC)   . COPD (chronic obstructive pulmonary disease) (HCC)   . DVT (deep venous thrombosis) (HCC) "2-3 times"  . GERD (gastroesophageal reflux disease)   . Gout   . Hypertension   . Hyperthyroidism   . On home oxygen therapy    "2L; 24/7" (07/31/2015)  . Pneumonia   . Thyroid disease     Patient Active Problem List   Diagnosis  Date Noted  . Elevated troponin   . Severe mitral insufficiency 06/06/2016  . Subtherapeutic international normalized ratio (INR) 05/19/2016  . GI bleed 05/19/2016  . Pressure injury of skin 05/19/2016  . CHF exacerbation (HCC) 05/18/2016  . End stage renal disease (HCC) 04/13/2016  . Chronic anticoagulation 04/13/2016  . Acute on chronic renal failure (HCC)   . Chronic respiratory failure with hypoxia (HCC) 11/08/2015  . Hypokalemia   . Left hip pain   . Chest pain   . Coronary atherosclerosis 10/18/2015  . Goals of care, counseling/discussion   . Palliative care encounter   . Permanent atrial fibrillation (HCC)   . COPD (chronic obstructive pulmonary disease) (HCC) 05/17/2015  . Chronic combined systolic and diastolic CHF (congestive heart failure) (HCC) 05/17/2015  . Protein-calorie malnutrition, severe 03/20/2015  . Dyspnea 03/18/2015  . Normocytic anemia 12/12/2013  . Edema 12/12/2013  . Community acquired pneumonia 06/11/2011  . Hyperthyroidism 06/11/2011  . Glaucoma 06/11/2011  . DVT (deep venous thrombosis) (HCC) 06/11/2011  . Hypertension   . Gout     Past Surgical History:  Procedure Laterality Date  . CATARACT EXTRACTION W/ INTRAOCULAR LENS  IMPLANT, BILATERAL Bilateral   . JOINT REPLACEMENT    . TOTAL KNEE ARTHROPLASTY Right 06/2004   [name]/notes 05/26/2010  . VAGINAL HYSTERECTOMY      OB History    No data available       Home Medications    Prior to Admission medications  Medication Sig Start Date End Date Taking? Authorizing Provider  acetaminophen (TYLENOL 8 HOUR ARTHRITIS PAIN) 650 MG CR tablet Take 650-1,300 mg by mouth every 8 (eight) hours as needed for pain.   Yes [provider]  allopurinol (ZYLOPRIM) 100 MG tablet Take 1 tablet (100 mg total) by mouth every morning. 07/10/15  Yes Jonah Blue, MD  furosemide (LASIX) 20 MG tablet Take 80 mg by mouth daily.   Yes [provider]  gabapentin (NEURONTIN) 300 MG capsule Take 300  mg by mouth at bedtime.   Yes [provider]  metolazone (ZAROXOLYN) 2.5 MG tablet Take 1 tablet (2.5 mg total) by mouth as directed. Patient taking differently: Take 2.5 mg by mouth daily.  06/18/16 07/18/16 Yes Croitoru, Mihai, MD  metoprolol tartrate (LOPRESSOR) 25 MG tablet Take 1 tablet (25 mg total) by mouth 2 (two) times daily. 11/13/15  Yes Hongalgi, Maximino Greenland, MD  OXYGEN Inhale 2 L into the lungs continuous.   Yes [provider]  pantoprazole (PROTONIX) 40 MG tablet Take 1 tablet (40 mg total) by mouth daily. 07/10/15  Yes Jonah Blue, MD  senna (SENOKOT) 8.6 MG TABS tablet Take 1 tablet (8.6 mg total) by mouth daily. 07/10/15  Yes Jonah Blue, MD  traMADol (ULTRAM) 50 MG tablet TAKE 1 TABLET BY MOUTH TWICE A DAY AS NEEDED FOR PAIN 06/09/16  Yes Tarry Kos, MD    Family History Family History  Problem Relation Age of Onset  . Stroke Neg Hx   . CAD Neg Hx     Social History Social History  Substance Use Topics  . Smoking status: Former Smoker    Packs/day: 2.00    Years: 50.00    Types: Cigarettes    Quit date: 03/12/2015  . Smokeless tobacco: Never Used  . Alcohol use No     Allergies   Patient has no known allergies.   Review of Systems Review of Systems  Constitutional: Positive for appetite change and fatigue. Negative for fever.  HENT: Negative for congestion, rhinorrhea and sore throat.   Eyes: Negative for visual disturbance.  Respiratory: Negative for cough and shortness of breath.   Cardiovascular: Negative for chest pain.  Gastrointestinal: Positive for constipation. Negative for abdominal pain, blood in stool, nausea and vomiting.  Endocrine: Negative for polyuria.  Genitourinary: Negative for difficulty urinating and dysuria.  Musculoskeletal: Positive for arthralgias. Negative for back pain and neck pain.  Skin: Negative for rash.  Neurological: Negative for syncope, speech difficulty and headaches.  Psychiatric/Behavioral:  Negative for confusion.     Physical Exam Updated Vital Signs BP 116/64 (BP Location: Right Arm)   Pulse 67   Temp 98 F (36.7 C) (Oral)   Resp 18   Ht 5\' 4"  (1.626 m)   Wt 110 lb (49.9 kg)   SpO2 100%   BMI 18.88 kg/m   Physical Exam  Constitutional: She is oriented to person, place, and time. She appears well-developed. No distress.  Cachectic.   HENT:  Head: Normocephalic and atraumatic.  Eyes: Conjunctivae and EOM are normal.  Neck: Normal range of motion. Neck supple.  Cardiovascular: Normal rate, regular rhythm, normal heart sounds and intact distal pulses.  Exam reveals no gallop and no friction rub.   No murmur heard. Pulmonary/Chest: Effort normal and breath sounds normal. No respiratory distress. She has no wheezes. She has no rales.  Abdominal: Soft. She exhibits no distension. There is no tenderness. There is no guarding.  Musculoskeletal: Normal range of  motion. She exhibits no edema or tenderness.  Neurological: She is alert and oriented to person, place, and time.  Skin: Skin is warm and dry. No rash noted. She is not diaphoretic. No erythema.  Psychiatric: She has a normal mood and affect.  Nursing note and vitals reviewed.    ED Treatments / Results  DIAGNOSTIC STUDIES: Oxygen Saturation is 100% on RA, normal by my interpretation.    COORDINATION OF CARE: 11:38 PM- Pt advised of plan for treatment and pt agrees.  Labs (all labs ordered are listed, but only abnormal results are displayed) Labs Reviewed  BASIC METABOLIC PANEL - Abnormal; Notable for the following:       Result Value   Potassium 3.0 (*)    Chloride 96 (*)    BUN 144 (*)    Creatinine, Ser 4.82 (*)    GFR calc non Af Amer 8 (*)    GFR calc Af Amer 9 (*)    All other components within normal limits  CBC - Abnormal; Notable for the following:    WBC 3.9 (*)    RBC 2.64 (*)    Hemoglobin 7.7 (*)    HCT 24.9 (*)    RDW 19.4 (*)    Platelets 109 (*)    All other components within  normal limits  URINALYSIS, ROUTINE W REFLEX MICROSCOPIC - Abnormal; Notable for the following:    Color, Urine STRAW (*)    Leukocytes, UA MODERATE (*)    Bacteria, UA RARE (*)    Squamous Epithelial / LPF 0-5 (*)    All other components within normal limits  BASIC METABOLIC PANEL - Abnormal; Notable for the following:    Potassium 2.9 (*)    Chloride 95 (*)    Glucose, Bld 106 (*)    BUN 134 (*)    Creatinine, Ser 4.62 (*)    Calcium 10.4 (*)    GFR calc non Af Amer 8 (*)    GFR calc Af Amer 9 (*)    All other components within normal limits  CBC - Abnormal; Notable for the following:    RBC 2.96 (*)    Hemoglobin 8.6 (*)    HCT 28.0 (*)    RDW 19.6 (*)    Platelets 138 (*)    All other components within normal limits  MAGNESIUM  CBG MONITORING, ED  POC OCCULT BLOOD, ED    EKG  EKG Interpretation None       Radiology No results found.  Procedures Procedures (including critical care time)  Medications Ordered in ED Medications  traMADol (ULTRAM) tablet 50 mg (50 mg Oral Given 06/24/16 1217)  metoprolol tartrate (LOPRESSOR) tablet 25 mg (25 mg Oral Given 06/24/16 0934)  allopurinol (ZYLOPRIM) tablet 100 mg (100 mg Oral Given 06/24/16 0934)  pantoprazole (PROTONIX) EC tablet 40 mg (40 mg Oral Given 06/24/16 0934)  senna (SENOKOT) tablet 8.6 mg (8.6 mg Oral Given 06/24/16 0934)  acetaminophen (TYLENOL) tablet 650 mg (650 mg Oral Given 06/24/16 0937)    Or  acetaminophen (TYLENOL) suppository 650 mg ( Rectal See Alternative 06/24/16 0937)  sorbitol 70 % solution 30 mL (not administered)  acetaminophen (TYLENOL) tablet 650 mg (650 mg Oral Given 06/24/16 0011)  sodium chloride 0.9 % bolus 500 mL (0 mLs Intravenous Stopped 06/24/16 0118)  potassium chloride SA (K-DUR,KLOR-CON) CR tablet 20 mEq (20 mEq Oral Given 06/24/16 0248)  potassium chloride SA (K-DUR,KLOR-CON) CR tablet 40 mEq (40 mEq Oral Given 06/24/16 0811)  Initial Impression / Assessment and Plan / ED Course    I have reviewed the triage vital signs and the nursing notes.  Pertinent labs & imaging results that were available during my care of the patient were reviewed by me and considered in my medical decision making (see chart for details).     83yo female with history of CHF, COPD on home O2, CKD IV, chronic DVT previously on coumadin, atrial fibrillation, recent admission in May for anemia, concern for GI bleed, presents with concern for worsening renal function, decreased appetite, fatigue, pruritis.  BUN elevated to 144, Creatinine 4.82 (from 3.49), GFR decreased to 9 from previous 13.  Symptoms may be secondary to developing uremia. Pt not dialysis candidate per prior discussions and no indication for acute dialysis.  Anemia worsened from prior, no hx to suggest acute GI bleed, stool brown in color, hemoccult negative.  Will admit for hydration in setting of decreased appetite, continued discussions with palliative care.   Final Clinical Impressions(s) / ED Diagnoses   Final diagnoses:  Uremia  Other fatigue  Decreased appetite    New Prescriptions Current Discharge Medication List    I personally performed the services described in this documentation, which was scribed in my presence. The recorded information has been reviewed and is accurate.    Alvira Monday, MD 06/24/16 [phone]

## 2016-06-23 NOTE — Telephone Encounter (Signed)
Spoke with pt son, aware of lab results and the need to go to the ER tonight for evaluation of kidney function.

## 2016-06-23 NOTE — ED Triage Notes (Signed)
Pt told to come to ED after labs at cards checkup showed elevated kidney function Bun 5.11, Creat 132. Family reports increased fatigue and "itching" to back.  Pt Alert in triage, 2L 02 by  per her norm

## 2016-06-24 ENCOUNTER — Encounter (HOSPITAL_COMMUNITY): Payer: Self-pay | Admitting: Family Medicine

## 2016-06-24 DIAGNOSIS — J9611 Chronic respiratory failure with hypoxia: Secondary | ICD-10-CM

## 2016-06-24 DIAGNOSIS — R630 Anorexia: Secondary | ICD-10-CM | POA: Diagnosis not present

## 2016-06-24 DIAGNOSIS — I340 Nonrheumatic mitral (valve) insufficiency: Secondary | ICD-10-CM | POA: Diagnosis not present

## 2016-06-24 DIAGNOSIS — I10 Essential (primary) hypertension: Secondary | ICD-10-CM

## 2016-06-24 DIAGNOSIS — Z7189 Other specified counseling: Secondary | ICD-10-CM

## 2016-06-24 DIAGNOSIS — N179 Acute kidney failure, unspecified: Secondary | ICD-10-CM | POA: Diagnosis not present

## 2016-06-24 DIAGNOSIS — D649 Anemia, unspecified: Secondary | ICD-10-CM

## 2016-06-24 DIAGNOSIS — Z515 Encounter for palliative care: Secondary | ICD-10-CM | POA: Diagnosis not present

## 2016-06-24 DIAGNOSIS — R7989 Other specified abnormal findings of blood chemistry: Secondary | ICD-10-CM

## 2016-06-24 DIAGNOSIS — E43 Unspecified severe protein-calorie malnutrition: Secondary | ICD-10-CM

## 2016-06-24 DIAGNOSIS — I482 Chronic atrial fibrillation: Secondary | ICD-10-CM | POA: Diagnosis not present

## 2016-06-24 DIAGNOSIS — I5042 Chronic combined systolic (congestive) and diastolic (congestive) heart failure: Secondary | ICD-10-CM

## 2016-06-24 DIAGNOSIS — N186 End stage renal disease: Secondary | ICD-10-CM

## 2016-06-24 DIAGNOSIS — I132 Hypertensive heart and chronic kidney disease with heart failure and with stage 5 chronic kidney disease, or end stage renal disease: Secondary | ICD-10-CM | POA: Diagnosis not present

## 2016-06-24 DIAGNOSIS — E0590 Thyrotoxicosis, unspecified without thyrotoxic crisis or storm: Secondary | ICD-10-CM

## 2016-06-24 DIAGNOSIS — E876 Hypokalemia: Secondary | ICD-10-CM

## 2016-06-24 DIAGNOSIS — R748 Abnormal levels of other serum enzymes: Secondary | ICD-10-CM

## 2016-06-24 DIAGNOSIS — J438 Other emphysema: Secondary | ICD-10-CM | POA: Diagnosis not present

## 2016-06-24 DIAGNOSIS — N185 Chronic kidney disease, stage 5: Secondary | ICD-10-CM

## 2016-06-24 DIAGNOSIS — R778 Other specified abnormalities of plasma proteins: Secondary | ICD-10-CM

## 2016-06-24 LAB — BASIC METABOLIC PANEL
ANION GAP: 15 (ref 5–15)
BUN: 134 mg/dL — ABNORMAL HIGH (ref 6–20)
CALCIUM: 10.4 mg/dL — AB (ref 8.9–10.3)
CO2: 27 mmol/L (ref 22–32)
Chloride: 95 mmol/L — ABNORMAL LOW (ref 101–111)
Creatinine, Ser: 4.62 mg/dL — ABNORMAL HIGH (ref 0.44–1.00)
GFR calc non Af Amer: 8 mL/min — ABNORMAL LOW (ref >60)
GFR, EST AFRICAN AMERICAN: 9 mL/min — AB (ref >60)
Glucose, Bld: 106 mg/dL — ABNORMAL HIGH (ref 65–99)
POTASSIUM: 2.9 mmol/L — AB (ref 3.5–5.1)
SODIUM: 137 mmol/L (ref 135–145)

## 2016-06-24 LAB — CBC
HEMATOCRIT: 28 % — AB (ref 36.0–46.0)
HEMOGLOBIN: 8.6 g/dL — AB (ref 12.0–15.0)
MCH: 29.1 pg (ref 26.0–34.0)
MCHC: 30.7 g/dL (ref 30.0–36.0)
MCV: 94.6 fL (ref 78.0–100.0)
Platelets: 138 10*3/uL — ABNORMAL LOW (ref 150–400)
RBC: 2.96 MIL/uL — ABNORMAL LOW (ref 3.87–5.11)
RDW: 19.6 % — ABNORMAL HIGH (ref 11.5–15.5)
WBC: 6 10*3/uL (ref 4.0–10.5)

## 2016-06-24 LAB — POC OCCULT BLOOD, ED: Fecal Occult Bld: NEGATIVE

## 2016-06-24 LAB — MAGNESIUM: MAGNESIUM: 2 mg/dL (ref 1.7–2.4)

## 2016-06-24 MED ORDER — POTASSIUM CHLORIDE CRYS ER 20 MEQ PO TBCR
40.0000 meq | EXTENDED_RELEASE_TABLET | Freq: Once | ORAL | Status: AC
  Administered 2016-06-24: 40 meq via ORAL
  Filled 2016-06-24: qty 2

## 2016-06-24 MED ORDER — ACETAMINOPHEN 650 MG RE SUPP: 650.0000 mg | Freq: Four times a day (QID) | RECTAL | Status: DC | PRN

## 2016-06-24 MED ORDER — METOPROLOL TARTRATE 25 MG PO TABS
25.0000 mg | ORAL_TABLET | Freq: Two times a day (BID) | ORAL | Status: DC
  Administered 2016-06-24 (×2): 25 mg via ORAL
  Filled 2016-06-24 (×2): qty 1

## 2016-06-24 MED ORDER — PANTOPRAZOLE SODIUM 40 MG PO TBEC
40.0000 mg | DELAYED_RELEASE_TABLET | Freq: Every day | ORAL | Status: DC
  Administered 2016-06-24: 40 mg via ORAL
  Filled 2016-06-24: qty 1

## 2016-06-24 MED ORDER — POTASSIUM CHLORIDE CRYS ER 20 MEQ PO TBCR
20.0000 meq | EXTENDED_RELEASE_TABLET | Freq: Once | ORAL | Status: AC
  Administered 2016-06-24: 20 meq via ORAL
  Filled 2016-06-24: qty 1

## 2016-06-24 MED ORDER — ACETAMINOPHEN 325 MG PO TABS
650.0000 mg | ORAL_TABLET | Freq: Four times a day (QID) | ORAL | Status: DC | PRN
  Administered 2016-06-24: 650 mg via ORAL
  Filled 2016-06-24: qty 2

## 2016-06-24 MED ORDER — SENNA 8.6 MG PO TABS
1.0000 | ORAL_TABLET | Freq: Every day | ORAL | Status: DC
  Administered 2016-06-24: 8.6 mg via ORAL
  Filled 2016-06-24: qty 1

## 2016-06-24 MED ORDER — SENNA 8.6 MG PO TABS: 1.0000 | ORAL_TABLET | Freq: Every day | ORAL | Status: DC

## 2016-06-24 MED ORDER — TRAMADOL HCL 50 MG PO TABS
50.0000 mg | ORAL_TABLET | Freq: Two times a day (BID) | ORAL | Status: DC | PRN
  Administered 2016-06-24 (×2): 50 mg via ORAL
  Filled 2016-06-24 (×2): qty 1

## 2016-06-24 MED ORDER — SORBITOL 70 % SOLN: 30.0000 mL | Freq: Every day | Status: DC | PRN

## 2016-06-24 MED ORDER — ALLOPURINOL 100 MG PO TABS
100.0000 mg | ORAL_TABLET | Freq: Every morning | ORAL | Status: DC
  Administered 2016-06-24: 100 mg via ORAL
  Filled 2016-06-24: qty 1

## 2016-06-24 NOTE — Progress Notes (Addendum)
Full note to follow:  [name]'s family have decided DNR status given her previous wishes for no invasive measures. She does not like to talk about these things or dying. We were able to discuss having hospice services help her at home and she has accepted this "as a trial." As long as hospice helps care for her but for further conversations regarding dying and EOL wishes they wish for this to be addressed by her son. She had a bad experience with a hospice worker telling her son "the train is waiting, go ahead and get on" and she has had a negative view of hospice but is willing to try working with them "as long as they don't say things like that." She has strong faith and "I know where I am going" but she has no interest in discussing this further.   Patient/family still wish to see cardiology and nephrology for input this admission prior to d/c with hospice.   Yong ChannelAlicia Delma Drone, NP Palliative Medicine Team Pager # [phone] (M-F 8a-5p) Team Phone # [phone] (Nights/Weekends)

## 2016-06-24 NOTE — Consult Note (Signed)
Consultation Note Date: 06/24/2016   Patient Name: [name] Alexander  DOB: 03-18-1933  MRN: [id number]  Age / Sex: 83 y.o., female  PCP: Foye Spurling, MD Referring Physician: Baird Lyons*  Reason for Consultation: GOC  HPI/Patient Profile: 83 y.o. female  with past medical history of CKD stage V not on HD, CHF EF 45%, severe MR from rheumatic heart disease, Afib no longer on warfarin, chronic DVTs, hyperthyroidism no longer on methimazole, COPD on 3L home O2, and chronic anemia admitted on 06/23/2016 after her cardiologist discovered worsening renal function on routine labs and advised family to bring her to the hospital. Palliative care requested for [name] Alexander given her previous desires for no dialysis.   Clinical Assessment and Goals of Care: I first met privately with [name] Alexander's son, [name], and his wife. They have good understanding of her poor health and decline and that there are not options that will reverse her condition. They are having more and more difficulty caring for her in their home as well. They have already been discussing with Dr. Maylene Roes the option of hospice assistance at home and agree this is what they need. They had many questions and hospice services explained. We also discussed her consistent desire to not pursue aggressive and invasive care would be in line with DNR and they agree.  We went back to discuss with [name] Alexander. She is very pleasant and sitting up in chair. Wants to go home. We discussed her worsening kidneys and health and that the hospital really does not have anything else that could help to reverse these conditions. Explained that the best we can do for her is help to make sure she has the help she needs at home. However, she reluctantly agrees to a "trial" of hospice. As long as hospice helps care for her but for further conversations regarding dying and EOL wishes they  wish for this to be addressed by her son. She had a bad experience with a hospice workers telling her son "the train is waiting, go ahead and get on" and "making him too sleepy every time they came. She has had a negative view of hospice but is willing to try working with them "as long as they don't say things like that." She has strong faith and "I know where I am going" and says that she is ready "when God calls her home." I reassured her that everyone's goal is to make sure she is comfortable and happy until God does call her home.   Primary Decision Maker NEXT OF KIN son [name] (he is her only living child)    SUMMARY OF RECOMMENDATIONS   - DNR - Home with hospice - ALL discussions regarding dying and funeral arrangements to be done with son unless [name] Alexander brings up the subject  Code Status/Advance Care Planning:  DNR   Symptom Management:   Denies any pain or discomfort  Constipation: Will give extra dose of senokot. Sorbitol daily prn.   Decreased appetite: liberalize diet for comfort  Palliative Prophylaxis:   Bowel Regimen, Delirium Protocol and Frequent Pain Assessment  Additional Recommendations (Limitations, Scope, Preferences):  Avoid Hospitalization, No Hemodialysis and No Surgical Procedures  Psycho-social/Spiritual:   Desire for further Chaplaincy support:yes  Additional Recommendations: Caregiving  Support/Resources and Education on Hospice  Prognosis:   < 3 months  Discharge Planning: Home with Hospice      Primary Diagnoses: Present on Admission: . Acute on chronic renal failure ([place]) . Chronic combined systolic and diastolic CHF (congestive heart failure) ([place]) . Chronic respiratory failure with hypoxia ([place]) . COPD (chronic obstructive pulmonary disease) ([place]) . End stage renal disease ([place]) . Hypertension . Hyperthyroidism . Hypokalemia . Normocytic anemia . Permanent atrial fibrillation ([place]) . Protein-calorie malnutrition,  severe . Severe mitral insufficiency   I have reviewed the medical record, interviewed the patient and family, and examined the patient. The following aspects are pertinent.  Past Medical History:  Diagnosis Date  . Arthritis    "hips, knees, shoulders" (07/31/2015)  . Chronic diastolic CHF (congestive heart failure), NYHA class 2 ([place])   . CKD (chronic kidney disease), stage IV ([place])   . COPD (chronic obstructive pulmonary disease) ([place])   . DVT (deep venous thrombosis) (HCC) "2-3 times"  . GERD (gastroesophageal reflux disease)   . Gout   . Hypertension   . Hyperthyroidism   . On home oxygen therapy    "2L; 24/7" (07/31/2015)  . Pneumonia   . Thyroid disease    Social History   Social History  . Marital status: Widowed    Spouse name: N/A  . Number of children: N/A  . Years of education: N/A   Occupational History  . Retired    Social History Main Topics  . Smoking status: Former Smoker    Packs/day: 2.00    Years: 50.00    Types: Cigarettes    Quit date: 03/12/2015  . Smokeless tobacco: Never Used  . Alcohol use No  . Drug use: No  . Sexual activity: Yes   Other Topics Concern  . None   Social History Narrative   Good family support.   Family History  Problem Relation Age of Onset  . Stroke Neg Hx   . CAD Neg Hx    Scheduled Meds: . allopurinol  100 mg Oral q morning - 10a  . metoprolol tartrate  25 mg Oral BID  . pantoprazole  40 mg Oral Daily  . senna  1 tablet Oral Daily   Continuous Infusions: PRN Meds:.acetaminophen **OR** acetaminophen, sorbitol, traMADol No Known Allergies Review of Systems  Constitutional: Positive for activity change, appetite change and fatigue.  Gastrointestinal: Positive for constipation.  Neurological: Positive for weakness.    Physical Exam  Constitutional: She is oriented to person, place, and time. She appears cachectic.  HENT:  Head: Normocephalic and atraumatic.  Cardiovascular: Normal rate and regular  rhythm.   Pulmonary/Chest: Effort normal. No accessory muscle usage. No tachypnea. No respiratory distress.  Abdominal: Soft. Normal appearance.  Neurological: She is alert and oriented to person, place, and time.  Nursing note and vitals reviewed.   Vital Signs: BP (!) 106/52 (BP Location: Left Arm)   Pulse 64   Temp 98.2 F (36.8 C) (Oral)   Resp 18   Ht _0  (1.626 m)   Wt 50.5 kg (111 lb 6.4 oz)   SpO2 96%   BMI 19.12 kg/m  Pain Assessment: 0-10   Pain Score: 0-No pain   SpO2: SpO2: 96 % O2 Device:SpO2: 96 %  O2 Flow Rate: .O2 Flow Rate (L/min): 2 L/min  IO: Intake/output summary:  Intake/Output Summary (Last 24 hours) at 06/24/16 1456 Last data filed at 06/24/16 1230  Gross per 24 hour  Intake              620 ml  Output             1300 ml  Net             -680 ml    LBM: Last BM Date: 06/23/16 Baseline Weight: Weight: 49.9 kg (110 lb) Most recent weight: Weight: 50.5 kg (111 lb 6.4 oz)     Palliative Assessment/Data: 40%     Time Total: 29mn  Greater than 50%  of this time was spent counseling and coordinating care related to the above assessment and plan.  Signed by: AVinie Sill NP Palliative Medicine Team Pager # [phone](M-F 8a-5p) Team Phone # [phone](Nights/Weekends)

## 2016-06-24 NOTE — Discharge Summary (Signed)
Physician Discharge Summary  [name] BowenLillie B Alexander ZOX:[id number] DOB: [birth year] DOA: 06/23/2016  PCP: Laurena Slimmerlark, [name], MD  Admit date: 06/23/2016 Discharge date: 06/24/2016  Admitted From: Home Dis[facility]:  [facility]   Recommendations for Outpatient Follow-up:  1. Follow up with PCP in 1 week 2. Follow up with Cardiology in 1 week 3. Please obtain BMP/CBC in 1 week   Discharge Condition: Stable CODE STATUS: DNR Diet recommendation: Heart healthy  Brief/Interim Summary: From H&P by Dr. Maryfrances Bunnellanford: [name] Alexander is a 83 y.o. female with a past medical history significant for CKD stage V not on HD, CHF EF 45%, severe MR from rheumatic heart disease, Afib no longer on warfarin, chronic DVTs, hyperthyroidism no longer on methimazole, COPD on 3L home O2, and chronic anemia who presents with abnormal labs. The patient was last admitted a month ago for weakness found to have Hgb 6.8 (baseline at that time 7.7 g/dL), got 1u PRBCs, patient refused EGD/colonoscopy, warfarin stopped indefinitely. Since then, she has been back home (lives with her son), at baseline.  Saw her Cardiologist last week who ordered routine labs.  Was called today that the creatinine was an emergency and she was to go to the ER immediately.  Patient has no complaints.  She gets out of the house with a wheelchair 2-3 times a week [employer](church, out to eat) in the last month, uses her walker in the house, but is sedentary because of dyspnea.  Son notes that she has been sleepier than usual this past week, complaining of itching more often.  No confusion, weakness, melena, swelling, change in chronic dyspnea. TRH were asked to evaluate for worsening of renal function  Interim: She was evaluated by cardiology as well as palliative care. She declined further aggressive treatments such as HD. Family agreed that hospice may be best course of action. Patient discharged with home hospice.   Discharge Diagnoses:   Principal Problem:   Acute on chronic renal failure (HCC) Active Problems:   Hypertension   Hyperthyroidism   Normocytic anemia   Protein-calorie malnutrition, severe   COPD (chronic obstructive pulmonary disease) (HCC)   Chronic combined systolic and diastolic CHF (congestive heart failure) (HCC)   Permanent atrial fibrillation (HCC)   Chronic respiratory failure with hypoxia (HCC)   Hypokalemia   End stage renal disease (HCC)   Severe mitral insufficiency   Elevated troponin   1. Acute on chronic renal failure:  Declines HD  2. Hypokalemia:  Replace   3. Anemia:  Stable, FOBT negative. Hx GI bleed and anticoag decided to stop indefinitely   4. Thrombocytopenia:  Chronic, no change. Stable   5. Chronic systolic and diastolic CHF:  EF 45% with severe MR.  Baseline weight 113 lbs at Cardiology office recently.  Cardiology consulted. Medical management only.   6. Atrial fibrillation:  CHADS2Vasc 5. No longer on warfarin or rate control agents.  7. COPD with chronic hypoxemic respiratory failure Continue home O2 Stable  8. Goals of care Discussed with family, patient, palliative care. Code status changed to DNR. Enroll with home hospice.    Discharge Instructions  Discharge Instructions    Diet - low sodium heart healthy    Complete by:  As directed    Discharge instructions    Complete by:  As directed    You were cared for by a hospitalist during your hospital stay. If you have any questions about your discharge medications or the care you received while  you were in the hospital after you are discharged, you can call the unit and asked to speak with the hospitalist on call if the hospitalist that took care of you is not available. Once you are discharged, your primary care physician will handle any further medical issues. Please note that NO REFILLS for any discharge medications will be authorized once you are discharged, as it is imperative that you return  to your primary care physician (or establish a relationship with a primary care physician if you do not have one) for your aftercare needs so that they can reassess your need for medications and monitor your lab values.   Increase activity slowly    Complete by:  As directed      Allergies as of 06/24/2016   No Known Allergies     Medication List    TAKE these medications   allopurinol 100 MG tablet Commonly known as:  ZYLOPRIM Take 1 tablet (100 mg total) by mouth every morning.   furosemide 20 MG tablet Commonly known as:  LASIX Take 80 mg by mouth daily.   gabapentin 300 MG capsule Commonly known as:  NEURONTIN Take 300 mg by mouth at bedtime.   metolazone 2.5 MG tablet Commonly known as:  ZAROXOLYN Take 1 tablet (2.5 mg total) by mouth as directed. What changed:  when to take this   metoprolol tartrate 25 MG tablet Commonly known as:  LOPRESSOR Take 1 tablet (25 mg total) by mouth 2 (two) times daily.   OXYGEN Inhale 2 L into the lungs continuous.   pantoprazole 40 MG tablet Commonly known as:  PROTONIX Take 1 tablet (40 mg total) by mouth daily.   senna 8.6 MG Tabs tablet Commonly known as:  SENOKOT Take 1 tablet (8.6 mg total) by mouth daily.   traMADol 50 MG tablet Commonly known as:  ULTRAM TAKE 1 TABLET BY MOUTH TWICE A DAY AS NEEDED FOR PAIN   TYLENOL 8 HOUR ARTHRITIS PAIN 650 MG CR tablet Generic drug:  acetaminophen Take 650-1,300 mg by mouth every 8 (eight) hours as needed for pain.      Follow-up Information    [name], MD. Schedule an appointment as soon as possible for a visit in 1 week(s).   Specialty:  Internal Medicine Contact information: [address] [place] [address] [phone]        Croitoru, Rachelle Hora, MD. Schedule an appointment as soon as possible for a visit in 1 week(s).   Specialty:  Cardiology Contact information: [address] [place] [address] [phone]           No Known Allergies  Consultations:  Cardiology  Palliative Care   Procedures/Studies: No results found.   Discharge Exam: Vitals:   06/24/16 0555 06/24/16 1000  BP: 117/60 (!) 106/52  Pulse: 70 64  Resp: 18 18  Temp: 98.2 F (36.8 C) 98.2 F (36.8 C)   Vitals:   06/24/16 0100 06/24/16 0200 06/24/16 0555 06/24/16 1000  BP: 130/65 (!) 114/96 117/60 (!) 106/52  Pulse: 79 79 70 64  Resp: (!) 22 18 18 18   Temp:  98.9 F (37.2 C) 98.2 F (36.8 C) 98.2 F (36.8 C)  TempSrc:  Oral Oral Oral  SpO2: 99% 99% 99% 96%  Weight:  50.5 kg (111 lb 6.4 oz)    Height:  5\' 4"  (1.626 m)      General: Pt is alert, awake, not in acute distress Cardiovascular: Irreg rhythm, S1/S2 +,  no rubs, no gallops Respiratory: CTA bilaterally, no wheezing, no rhonchi Abdominal: Soft, NT, ND, bowel sounds + Extremities: no edema, no cyanosis    The results of significant diagnostics from this hospitalization (including imaging, microbiology, ancillary and laboratory) are listed below for reference.     Microbiology: No results found for this or any previous visit (from the past 240 hour(s)).   Labs: BNP (last 3 results)  Recent Labs  09/28/15 1145 11/10/15 1612 05/18/16 2051  BNP 625.4* 447.8* 1,299.3*   Basic Metabolic Panel:  Recent Labs Lab 06/18/16 1500 06/23/16 1922 06/24/16 0300  NA 138 136 137  K 3.7 3.0* 2.9*  CL 94* 96* 95*  CO2 22 26 27   GLUCOSE 92 97 106*  BUN 132* 144* 134*  CREATININE 5.11* 4.82* 4.62*  CALCIUM 10.0 9.9 10.4*  MG 2.2  --  2.0   Liver Function Tests: No results for input(s): AST, ALT, ALKPHOS, BILITOT, PROT, ALBUMIN in the last 168 hours. No results for input(s): LIPASE, AMYLASE in the last 168 hours. No results for input(s): AMMONIA in the last 168 hours. CBC:  Recent Labs Lab 06/18/16 1500 06/23/16 1922 06/24/16 0300  WBC 5.8 3.9* 6.0  HGB 8.3* 7.7* 8.6*  HCT 26.6* 24.9* 28.0*  MCV 94 94.3 94.6  PLT 104* 109* 138*   Cardiac  Enzymes: No results for input(s): CKTOTAL, CKMB, CKMBINDEX, TROPONINI in the last 168 hours. BNP: Invalid input(s): POCBNP CBG:  Recent Labs Lab 06/23/16 2351  GLUCAP 84   D-Dimer No results for input(s): DDIMER in the last 72 hours. Hgb A1c No results for input(s): HGBA1C in the last 72 hours. Lipid Profile No results for input(s): CHOL, HDL, LDLCALC, TRIG, CHOLHDL, LDLDIRECT in the last 72 hours. Thyroid function studies No results for input(s): TSH, T4TOTAL, T3FREE, THYROIDAB in the last 72 hours.  Invalid input(s): FREET3 Anemia work up No results for input(s): VITAMINB12, FOLATE, FERRITIN, TIBC, IRON, RETICCTPCT in the last 72 hours. Urinalysis    Component Value Date/Time   COLORURINE STRAW (A) 06/23/2016 1942   APPEARANCEUR CLEAR 06/23/2016 1942   LABSPEC 1.009 06/23/2016 1942   PHURINE 6.0 06/23/2016 1942   GLUCOSEU NEGATIVE 06/23/2016 1942   HGBUR NEGATIVE 06/23/2016 1942   BILIRUBINUR NEGATIVE 06/23/2016 1942   KETONESUR NEGATIVE 06/23/2016 1942   PROTEINUR NEGATIVE 06/23/2016 1942   UROBILINOGEN 0.2 12/12/2013 0414   NITRITE NEGATIVE 06/23/2016 1942   LEUKOCYTESUR MODERATE (A) 06/23/2016 1942   Sepsis Labs Invalid input(s): PROCALCITONIN,  WBC,  LACTICIDVEN Microbiology No results found for this or any previous visit (from the past 240 hour(s)).   Time coordinating discharge: 40 minutes  SIGNED:  Noralee Stain, DO [facility] Pager [phone]  If 7PM-7AM, please contact night-coverage [email or web address] Password [employer] 06/24/2016, 5:36 PM

## 2016-06-24 NOTE — Progress Notes (Signed)
1715--[facility] RN Visit  Notified by Johny Shockheryl Royal, RNCM, of patient/family request [facility] after discharge. Chart and patient information [facility]. Hospice eligibility pending at this time.  Spoke with patient, son Elisabeth MostStevenson and daughter-in-law Lanny HurstVeda at bedside to initiate education related to hospice philosophy, services and team approach to care. Patient/family verbalized understanding of information given. Per discussion, plan is for discharge to home by private vehicle this evening.  Please send signed and completed DNR form home with patient/family. Patient will need prescriptions for discharge comfort medications.  DME needs have been discussed, patient currently has O2, walker, Rolator, wheelchair, BSC and shower bench in the home. Family has requested an over the bed table. [facility] notified to arrange for delivery to the home. Home address has been verified and is correct in the chart. Elisabeth MostStevenson is the family member to contact to arrange time of delivery at [phone].  [facility]erral Center aware of the above. Completed discharge summary will need to be faxed to [phone].  Please notify [facility] when patient is ready to leave the unit at discharge by calling [phone].   [facility] information and contact numbers have been given to Paulo FruitStevenson and Veda during this visit.  Above information left as a Engineer, technical salesvoicemail for [employer]heryl Royal, RNCM  Please call with any hospice related questions or concerns.  Thank you, Haynes Bastracy Ennis, RN [facility] Hospital Liaison [phone]  [facility] Hospital Liaisons are on AMION.

## 2016-06-24 NOTE — Progress Notes (Signed)
  PROGRESS NOTE  Patient admitted earlier this morning. See H&P. Patient with chronic systolic/diastolic HF, chronic renal failure. She was seen by cardiology as outpatient with repeat labs showing worsening kidney function and was sent to the hospital. She is sitting in chair this morning, eating breakfast, with no complaints. She does not know why she was sent to the hospital because she feels fine. We briefly discussed goals of care and she tells me that she would like her son to be involved in conversation. I spoke with son over the phone and he is interested in pursuing palliative care and possibly hospice, although he tells me that in the past, she has been very resistant to palliative care conversations because she believes that they "just want to kill her off."   Await cardiology and palliative consult Patient refuses dialysis Replace potassium    Noralee StainJennifer Makira Holleman, DO [facility] [email or web address] Password [email or web address] 06/24/2016, 10:42 AM

## 2016-06-24 NOTE — Care Management Obs Status (Signed)
MEDICARE OBSERVATION STATUS NOTIFICATION   Patient Details  Name: [name] Alexander MRN: [id number] Date of Birth: 07/11/1933   Medicare Observation Status Notification Given:  Yes    Sheika Coutts, Annamarie MajorCheryl U, RN 06/24/2016, 10:46 AM

## 2016-06-24 NOTE — Consult Note (Signed)
Cardiology Consult    Patient ID: AJANEE BUREN MRN: [id number], DOB/AGE: [birth year]   Admit date: 06/23/2016 Date of Consult: 06/24/2016  Primary Physician: [name], MD Primary Cardiologist: Dr. Royann Shivers Requesting Provider: Dr. Alvino Chapel  Reason for Consult: chronic combine heart failure  Patient Profile    Ms. Panagopoulos has a PMH significant for persistent atrial fibrillation, moderate to severe mitral regurgitation, chronic systolic and diastolic heart failure, suspected CAD (calcifications on CT, inferior wall motion abnormality), severe COPD on home O2, CKD stage IV, hx of DVT on coumadin, HTN, HLD, and GERD. She was instructed to go to the ED today after abnormal labs drawn at her last clinic visit (06/18/16).  [name] Alexander is a 83 y.o. female who is being seen today for the evaluation of chronic combined heart failure at the request of Dr. Alvino Chapel.   Past Medical History   Past Medical History:  Diagnosis Date  . Arthritis    "hips, knees, shoulders" (07/31/2015)  . Chronic diastolic CHF (congestive heart failure), NYHA class 2 (HCC)   . CKD (chronic kidney disease), stage IV (HCC)   . COPD (chronic obstructive pulmonary disease) (HCC)   . DVT (deep venous thrombosis) (HCC) "2-3 times"  . GERD (gastroesophageal reflux disease)   . Gout   . Hypertension   . Hyperthyroidism   . On home oxygen therapy    "2L; 24/7" (07/31/2015)  . Pneumonia   . Thyroid disease     Past Surgical History:  Procedure Laterality Date  . CATARACT EXTRACTION W/ INTRAOCULAR LENS  IMPLANT, BILATERAL Bilateral   . JOINT REPLACEMENT    . TOTAL KNEE ARTHROPLASTY Right 06/2004   [name] 05/26/2010  . VAGINAL HYSTERECTOMY       Allergies  No Known Allergies  History of Present Illness    Ms Baker is known to our service and last saw Dr. Royann Shivers in clinic on 06/18/16. Her dry weight is thought to be near 100-113 lbs. She was previously hospitalized for an acute heart failure exacerbation in  March 2018; zarosolyn was added to her lasix regimen at that time. She was again hospitalized May 8 for weakness and anemia (Hb 6). Coumadin was stopped at that time for GI bleed.    At baseline, she sleeps in a recliner and has mild leg edema. She has not had any further bleeding since her last admission and denied chest pain and syncope at her last clinic visit. She is not a surgical candidate for her MR, which is thought to play a role in her heart failure. Anticoagulation is still on hold and likely will not be restarted in her. She is also not a heart catheterization for her suspected CAD secondary to her CKD  and history of bleeding complications while on coumadin.   Labs drawn at her last clinic visit include CBC, BMP, and Magnesium. She had an elevated sCr and BUN, higher than her baseline, and she was instructed to go to the [facility].   On admission, her sCr 4.82/BUN 144 and she was hypokalemic at 3.0. Her Hb was stable at 8.3. She was admitted to Medicine service. She refuses dialysis at this time. She denies chest pain, palpitations, dizziness, lightheadedness, N/V, and near-/syncope. She reports here LE edema is at her baseline. She is on her home O2.   Inpatient Medications    . allopurinol  100 mg Oral q morning - 10a  . metoprolol tartrate  25 mg Oral BID  . pantoprazole  40  mg Oral Daily  . senna  1 tablet Oral Daily     Outpatient Medications    Prior to Admission medications   Medication Sig Start Date End Date Taking? Authorizing Provider  acetaminophen (TYLENOL 8 HOUR ARTHRITIS PAIN) 650 MG CR tablet Take 650-1,300 mg by mouth every 8 (eight) hours as needed for pain.   Yes [provider]  allopurinol (ZYLOPRIM) 100 MG tablet Take 1 tablet (100 mg total) by mouth every morning. 07/10/15  Yes Jonah Blue, MD  furosemide (LASIX) 20 MG tablet Take 80 mg by mouth daily.   Yes [provider]  gabapentin (NEURONTIN) 300 MG capsule Take 300 mg by mouth at  bedtime.   Yes [provider]  metolazone (ZAROXOLYN) 2.5 MG tablet Take 1 tablet (2.5 mg total) by mouth as directed. Patient taking differently: Take 2.5 mg by mouth daily.  06/18/16 07/18/16 Yes Croitoru, Mihai, MD  metoprolol tartrate (LOPRESSOR) 25 MG tablet Take 1 tablet (25 mg total) by mouth 2 (two) times daily. 11/13/15  Yes Hongalgi, Maximino Greenland, MD  OXYGEN Inhale 2 L into the lungs continuous.   Yes [provider]  pantoprazole (PROTONIX) 40 MG tablet Take 1 tablet (40 mg total) by mouth daily. 07/10/15  Yes Jonah Blue, MD  senna (SENOKOT) 8.6 MG TABS tablet Take 1 tablet (8.6 mg total) by mouth daily. 07/10/15  Yes Jonah Blue, MD  traMADol (ULTRAM) 50 MG tablet TAKE 1 TABLET BY MOUTH TWICE A DAY AS NEEDED FOR PAIN 06/09/16  Yes Tarry Kos, MD     Family History     Family History  Problem Relation Age of Onset  . Stroke Neg Hx   . CAD Neg Hx     Social History    Social History   Social History  . Marital status: Widowed    Spouse name: N/A  . Number of children: N/A  . Years of education: N/A   Occupational History  . Retired    Social History Main Topics  . Smoking status: Former Smoker    Packs/day: 2.00    Years: 50.00    Types: Cigarettes    Quit date: 03/12/2015  . Smokeless tobacco: Never Used  . Alcohol use No  . Drug use: No  . Sexual activity: Yes   Other Topics Concern  . Not on file   Social History Narrative   Good family support.     Review of Systems    General:  No chills, fever, night sweats or weight changes.  Cardiovascular:  No chest pain, dyspnea on exertion, + edema, + orthopnea, no palpitations, paroxysmal nocturnal dyspnea. Dermatological: No rash, lesions/masses Respiratory: No cough, dyspnea Urologic: No hematuria, dysuria Abdominal:   No nausea, vomiting, diarrhea, bright red blood per rectum, melena, or hematemesis Neurologic:  No visual changes, changes in mental status. All other systems reviewed  and are otherwise negative except as noted above.  Physical Exam    Blood pressure (!) 106/52, pulse 64, temperature 98.2 F (36.8 C), temperature source Oral, resp. rate 18, height 5\' 4"  (1.626 m), weight 111 lb 6.4 oz (50.5 kg), SpO2 96 %.  General: Pleasant, NAD Psych: Normal affect. Neuro: Alert and oriented X 3. Moves all extremities spontaneously. HEENT: Normal  Neck: Supple without bruits or JVD. Lungs:  Resp regular and unlabored, decreased air movement throughout Heart: Irregular rhythm, regular rate, no murmurs. Abdomen: Soft, non-tender, non-distended, BS + x 4.  Extremities: No clubbing, cyanosis, trace edema. DP/PT/Radials 1+  and equal bilaterally.  Labs    Troponin (Point of Care Test) No results for input(s): TROPIPOC in the last 72 hours. No results for input(s): CKTOTAL, CKMB, TROPONINI in the last 72 hours. Lab Results  Component Value Date   WBC 6.0 06/24/2016   HGB 8.6 (L) 06/24/2016   HCT 28.0 (L) 06/24/2016   MCV 94.6 06/24/2016   PLT 138 (L) 06/24/2016    Recent Labs Lab 06/24/16 0300  NA 137  K 2.9*  CL 95*  CO2 27  BUN 134*  CREATININE 4.62*  CALCIUM 10.4*  GLUCOSE 106*   No results found for: CHOL, HDL, LDLCALC, TRIG Lab Results  Component Value Date   DDIMER 3.59 (H) 03/18/2015     Radiology Studies    No results found.  ECG & Cardiac Imaging    EKG 06/23/16: Afib with a LBBB  Echocardiogram 05/20/16: Study Conclusions - Left ventricle: The cavity size was normal. Wall thickness was   increased in a pattern of mild LVH. Systolic function was mildly   reduced. The estimated ejection fraction was in the range of 45%   to 50%. Diffuse hypokinesis. - Mitral valve: Mild prolapse, involving the anterior leaflet.   There was moderate to severe regurgitation directed posteriorly. - Left atrium: The atrium was severely dilated. - Tricuspid valve: There was moderate regurgitation. - Pulmonary arteries: Systolic pressure was mildly  increased. PA   peak pressure: 34 mm Hg (S).  Impressions: - No significant change from prior study. MR remains severe with   decreased EF. Valvular cardiomyopathy.  Assessment & Plan    1. Chronic combined systolic and diastolic heart failure - echo may 2018 showed mild LV dysfunction with EF 45-50% with diffuse HK, MVP with moderate so severe MR. - wt on admission is 111 lbs, which is below the 113 lb goal set by Dr. Royann Shiversroitoru - she has been compliant on medications, including lasix/zaroxolyn, and lopressor - BNP not collected given her poor renal function - restart home lasix, hold zaroxolyn today  2. Persistent atrial fibrillation - she is maintaining NSR - not a candidate for anticoagulation due to recent GI bleed - continue lopressor for rate control - she denies palpitations  3. Moderate to severe MR due to anterior leaflet MVP - likely contributing to CHF - no a candidate for surgical or percutaneous repair  4. Suspected CAD (calcifications seen on CT) - stable, no chest pain, no need to cycle troponins or serial EKGs - not a candidate for cardiac cath due to underlying CKD stage 5 and patient refusing dialysis if needed.   5. Acute on chronic kidney disease stage V, not on HD - sCr on admission was 4.82, BUN 144  6. Hypokalemia - 2.9 on admission, replace per primary team, keep 3.8-4.0   Signed, Marcelino Dusterngela Nicole Duke, PA-C 06/24/2016, 10:23 AM [phone]  Patient seen and independently examined with Micah FlesherAngela Duke, PA. We discussed all aspects of the encounter. I agree with the assessment and plan as stated above.  The patient is well known to Cardiology.  She has a history of MVP of the anterior leaflet with moderate to severe MR, probable CAD with coronary artery calcifications noted on Chest CT but not a candidate for cath due to severe CKD stage 5.  She has functional class IIIb-IV dyspnea and always sleeps in a recliner at baseline and mild chronic LE edema.  She is  not interested in pursuing dialysis if needed.  She has been deemed not to  be a candidate for MV repair due to underlying comorbidities and her HF has been managed with conservative medical therapy.    She was seen in clinic on 6/8 and had blood work ordered yesterday and had blood work drawn which showed worsening renal function and she was instructed to go to the ER.  She was found to have an elevated trop and now cardiology is asked to consult.  She denies any chest pain and her DOE is at baseline.  Her LE edema is also at baseline.  Her weight on admit was 110lb which is what it was a week ago in the office.  Per Dr. Erin Hearing note her dry weight is below 113lbs.  On exam her lungs are clear, heart is irregularly irregular with no M/R/G and trace edema.  She has permanent atrial fibrillation that is well rate controlled and is not a candidate for anticoagulation due to prior GI bleed. She was noted to have a minimally elevated trop at time of admission of 0.05>0.07>0.06 which is likely related to CKD.  She has severe O2 dependent COPD, chronic anemia and CKD stage 5 and is not a candidate for surgical or percutaneous revascularization.  Therefore, with no anginal symptoms or CHF exacerbation, would continue on medical therapy. Would replete potassium. Continue home meds.   Apparently patient is going to go to hospice.  She can followup in office with Dr. Royann Shivers.  No further recs at this time.  Will sign off. Call with any questions.  Signed: Armanda Magic, MD [facility] 06/24/2016

## 2016-06-24 NOTE — H&P (Signed)
History and Physical  Patient Name: [name] Alexander     TLX:[id number]    DOB: [birth year]    DOA: 06/23/2016 PCP: Foye Spurling, MD  Patient coming from: Home  Chief Complaint:  Abnormal labs      HPI: [name] is a 83 y.o. female with a past medical history significant for CKD stage V not on HD, CHF EF 45%, severe MR from rheumatic heart disease, Afib no longer on warfarin, chronic DVTs, hyperthyroidism no longer on methimazole, COPD on 3L home O2, and chronic anemia who presents with abnormal labs.  The patient was last admitted a month ago for weakness found to have Hgb 6.8 (baseline at that time 7.7 g/dL), got 1u PRBCs, patient refused EGD/colonoscopy, warfarin stopped indefinitely.    Since then, she has been back home (lives with her son), at baseline.  Saw her Cardiologist last week who ordered routine labs.  Was called today that the creatinine was an emergency and she was to go to the ER immediately.  Patient has no complaints.  She gets out of the house with a wheelchair 2-3 times a week [employer], out to eat) in the last month, uses her walker in the house, but is sedentary because of dyspnea.  Son notes that she has been sleepier than usual this past week, complaining of itching more often.  No confusion, weakness, melena, swelling, change in chronic dyspnea.  ED course: -Afebrile, heart rate 67, respirations and pulse ox normal (on home O2), BP 116/64 -Na 136, K 3.0, Cr 4.82 (baseline 3.5), WBC 3.9K, Hgb 7.7 (last Hgb 8.3, discharge Hgb last month 7.7) -No melena on rectal exam, FOBT negative -UA rare WBCs -She was given 500 cc bolus and TRH were asked to evaluate for worsening of renal function    -The patient has had eGFR 15-25 since 2013, over the last year, the range has been more like 9-16.   -She was admitted by me under similar circumstances last Oct (worsening of renal function), monitored for 5-6 days, got very judicious fluids and creatinine stabilized to ~4 by  discharge, although since then has crept back down to 3.5.   -For this patient, the difference between a creatinine of 3.5 and 5.1 is only a difference in eGFR of 4. -She has never seen Nephrology per family.   -Family state that her PCP and Cardiologist have always been clear that she is not a dialysis candidate, and patient has refused invasive procedures during last few hospitalizations.   -I do not see that she has ever been evaluated by Nephrology during recent admissions.     ROS: Review of Systems  Constitutional: Negative for chills, fever and malaise/fatigue.  Respiratory: Positive for shortness of breath (chronic, no change).   Gastrointestinal: Negative for blood in stool and melena.  Skin: Positive for itching.  Neurological: Positive for weakness (chronic, no change).  All other systems reviewed and are negative.         Past Medical History:  Diagnosis Date  . Arthritis    "hips, knees, shoulders" (07/31/2015)  . Chronic diastolic CHF (congestive heart failure), NYHA class 2 ([place])   . CKD (chronic kidney disease), stage IV ([place])   . COPD (chronic obstructive pulmonary disease) ([place])   . DVT (deep venous thrombosis) ([facility]) "2-3 times"  . GERD (gastroesophageal reflux disease)   . Gout   . Hypertension   . Hyperthyroidism   . On home oxygen therapy    "2L; 24/7" (07/31/2015)  .  Pneumonia   . Thyroid disease     Past Surgical History:  Procedure Laterality Date  . CATARACT EXTRACTION W/ INTRAOCULAR LENS  IMPLANT, BILATERAL Bilateral   . JOINT REPLACEMENT    . TOTAL KNEE ARTHROPLASTY Right 06/2004   Archie Endo 05/26/2010  . VAGINAL HYSTERECTOMY      Social History: Patient lives with her son.  The patient walks with a walker only short distances, mostly from her bed or chair to the commode.  Former smoker.  From [place].  Worked 28 years at [employer] ([place] [address]  No Known Allergies  Family history: Parents deceased, healthy.  Prior to  Admission medications   Medication Sig Start Date End Date Taking? Authorizing Provider  acetaminophen (TYLENOL 8 HOUR ARTHRITIS PAIN) 650 MG CR tablet Take 650-1,300 mg by mouth every 8 (eight) hours as needed for pain.   Yes [provider]  allopurinol (ZYLOPRIM) 100 MG tablet Take 1 tablet (100 mg total) by mouth every morning. 07/10/15  Yes Karmen Bongo, MD  furosemide (LASIX) 20 MG tablet Take 80 mg by mouth daily.   Yes [provider]  gabapentin (NEURONTIN) 300 MG capsule Take 300 mg by mouth at bedtime.   Yes [provider]  metolazone (ZAROXOLYN) 2.5 MG tablet Take 1 tablet (2.5 mg total) by mouth as directed. Patient taking differently: Take 2.5 mg by mouth daily.  06/18/16 07/18/16 Yes Croitoru, Mihai, MD  metoprolol tartrate (LOPRESSOR) 25 MG tablet Take 1 tablet (25 mg total) by mouth 2 (two) times daily. 11/13/15  Yes Hongalgi, Lenis Dickinson, MD  OXYGEN Inhale 2 L into the lungs continuous.   Yes [provider]  pantoprazole (PROTONIX) 40 MG tablet Take 1 tablet (40 mg total) by mouth daily. 07/10/15  Yes Karmen Bongo, MD  senna (SENOKOT) 8.6 MG TABS tablet Take 1 tablet (8.6 mg total) by mouth daily. 07/10/15  Yes Karmen Bongo, MD  traMADol (ULTRAM) 50 MG tablet TAKE 1 TABLET BY MOUTH TWICE A DAY AS NEEDED FOR PAIN 06/09/16  Yes Leandrew Koyanagi, MD       Physical Exam: BP 130/65   Pulse 79   Temp 98 F (36.7 C) (Oral)   Resp (!) 22   Ht 5' 4" (1.626 m)   Wt 49.9 kg (110 lb)   SpO2 99%   BMI 18.88 kg/m  General appearance: Frail elderly adult female, alert and in no acute distress.   Eyes: Anicteric, conjunctiva pink, lids and lashes normal. [place].  Cataracts.  Pupils equal.    ENT: No nasal deformity, discharge, epistaxis.  Hearing normal. OP moist without lesions.   Skin: Warm and dry.  No suspicious rashes or lesions.  Distended surface vessels on chest.  Cardiac: RRR, nl S1-S2, holosys mrumur, soft.  Capillary refill is brisk.   JVP not visible.  No pitting LE edema.  Radial pulses 2+ and symmetric. Respiratory: Normal respiratory rate and rhythm.  Diminished.  No wheezes. Abdomen: Abdomen soft.  No TTP. No ascites, distension, hepatosplenomegaly.   MSK: No deformities or effusions.  No cyanosis or clubbing.  Diffuse loss of muscel mass. Neuro: Cranial nerves normal. Speech is fluent.  Muscle strength globally weak.    Psych: Sensorium intact and responding to questions, attention normal.  Behavior appropriate.  Affect pleasant.  Judgment and insight appear normal.     Labs on Admission:  I have personally reviewed following labs and imaging studies: CBC:  Recent Labs Lab 06/18/16 1500 06/23/16 1922  WBC 5.8 3.9*  HGB 8.3* 7.7*  HCT 26.6* 24.9*  MCV 94 94.3  PLT 104* 151*   Basic Metabolic Panel:  Recent Labs Lab 06/18/16 1500 06/23/16 1922  NA 138 136  K 3.7 3.0*  CL 94* 96*  CO2 22 26  GLUCOSE 92 97  BUN 132* 144*  CREATININE 5.11* 4.82*  CALCIUM 10.0 9.9  MG 2.2  --    GFR: Estimated Creatinine Clearance: 7.3 mL/min (A) (by C-G formula based on SCr of 4.82 mg/dL (H)).  Liver Function Tests: No results for input(s): AST, ALT, ALKPHOS, BILITOT, PROT, ALBUMIN in the last 168 hours. No results for input(s): LIPASE, AMYLASE in the last 168 hours. No results for input(s): AMMONIA in the last 168 hours. Coagulation Profile: No results for input(s): INR, PROTIME in the last 168 hours. Cardiac Enzymes: No results for input(s): CKTOTAL, CKMB, CKMBINDEX, TROPONINI in the last 168 hours. BNP (last 3 results) No results for input(s): PROBNP in the last 8760 hours. HbA1C: No results for input(s): HGBA1C in the last 72 hours. CBG:  Recent Labs Lab 06/23/16 2351  GLUCAP 84   Lipid Profile: No results for input(s): CHOL, HDL, LDLCALC, TRIG, CHOLHDL, LDLDIRECT in the last 72 hours. Thyroid Function Tests: No results for input(s): TSH, T4TOTAL, FREET4, T3FREE, THYROIDAB in the last 72  hours. Anemia Panel: No results for input(s): VITAMINB12, FOLATE, FERRITIN, TIBC, IRON, RETICCTPCT in the last 72 hours. Sepsis Labs:  Invalid input(s): PROCALCITONIN, LACTICIDVEN No results found for this or any previous visit (from the past 240 hour(s)).        EKG: Independently reviewed. Rate 72, atrial fibrillation, old IVCD.  Echocardiogram May 2018: Report reviewed EF 45-50% Moderate to severe MR PAP 34 Mild LVH         Assessment/Plan  1. Acute on chronic renal failure:  Baseline Cr 3.5, currently 4.8, with BUN up to 144, although in terms of GFR this is a minor change.  Symptomatically, she is feeling sleepiness and itching.   -Hold further fluids -Hold diuretics -Consult to Cardiology and Nephrology for prognosis -Consult to Palliative Care   2. Hypokalemia:  -Oral K once -Check Mag -Hold diuretics  3. Anemia:  Stable, FOBT negative.  4. Thrombocytopenia:  Chronic, no change.  5. Chronic systolic and diastolic CHF:  EF 76% with severe MR.  Baseline weight 113 lbs at Cardiology office recently. -Hold metolazone and furosemide for now -Consult to Cardiology -Continue metoprolol -Daily weights, I/Os  6. Other medications:  -Continue allopurinol -Hold gabapentin given ESRD -Continue PPI -Continue Senna  7. Atrial fibrillation:  CHADS2Vasc 5.  No longer on warfarin or rate control agents.  8. COPD: Inactive -Continue home O2     DVT prophylaxis: SCDs  Code Status: FULL  Family Communication: Son at bedside  Disposition Plan: Anticipate IV fluids given, consult to Cardiology and Nephrology for prognosis, then consider additional fluids, and consult to Palliative Care Consults called: Cardiology via Inbasket Admission status: OBS At the point of initial evaluation, it is my clinical opinion that admission for OBSERVATION is reasonable and necessary because the patient's presenting complaints in the context of their chronic conditions  represent sufficient risk of deterioration or significant morbidity to constitute reasonable grounds for close observation in the hospital setting, but that the patient may be medically stable for discharge from the hospital within 24 to 48 hours.    Medical decision making: Patient seen at 12:15 AM on 06/24/2016.  The patient was discussed with Dr. Billy Fischer.  What exists of  the patient's chart was reviewed in depth and summarized above.  Clinical condition: stable.        Edwin Dada [facility] Pager [phone]

## 2016-06-24 NOTE — Care Management Note (Addendum)
Case Management Note  Patient Details  Name: [name] Alexander MRN: [id number] Date of Birth: 05/19/1933  Subjective/Objective:    CM following for progression and d/c planning.   Pt lives at home currently sleeps in a recliner and has oxygen from [facility].  Pt lives with son and daughter in law.               Action/Plan: 06/24/2016 met with pt , son and daughter in law, per son, Dr Maylene Roes is requesting Palliative care consult and perhaps hospice services in the home. This CM spoke with Dr Maylene Roes re timeliness fo Palliative consult as this pt is OBS status and if the family is unsure at this time then Pall could be set up to see this pt in the home with orders for hospice services for home provided by the pt PCP post d/c as her status has not acutely changed. Dr Maylene Roes was reminded of this option.  11:30 Palliative care in to meet with pt and family. After discussion with pt and family and consulting with pt son and daughter in law they have decided to proceed with hospice services in the home. This CM offered choice of agencies to the pt son and he has selected [facility] was contacted and the referral for [facility] services was made @ 1:20pm . CM requested that Hospice representative contact CM before going to the pt room as the pt is very sensative to any discussions re  hospice.     Expected Discharge Date:                  Expected Discharge Plan:  [place]  In-House Referral:  NA  Discharge planning Services  CM Consult  Post Acute Care Choice:  Home Health, Durable Medical Equipment Choice offered to:  Adult Children  DME Arranged:    DME Agency:     HH Arranged:  RN, Social Work, Nurse's Aide [place] Agency:     Status of Service:  In process, will continue to follow  If discussed at Long Length of Stay Meetings, dates discussed:    Additional Comments:  Adron Bene, RN 06/24/2016, 11:35 AM

## 2016-06-24 NOTE — Progress Notes (Signed)
[facility]  Patient Status: Active (receiving services up to time of hospitalization)  [facility] is providing the following services: PT  If patient discharges after hours, please call [phone].   [name] FurnishDonna Alexander 06/24/2016, 3:53 PM

## 2016-06-24 NOTE — ED Notes (Signed)
MD at bedside. 

## 2016-06-24 NOTE — ED Notes (Addendum)
Pt sent by doctor. Pt has no complaints.

## 2016-06-25 ENCOUNTER — Telehealth: Payer: Self-pay | Admitting: Cardiovascular Disease

## 2016-06-25 NOTE — Telephone Encounter (Signed)
Notified [name] Alexander at hospice that Dr C is out of the country for at least 3 weeks, she states that she will call PCP for this

## 2016-06-25 NOTE — Telephone Encounter (Signed)
New message      Calling to see if Dr Royann Shiversroitoru will be patient's attending phy for hospice

## 2016-07-02 NOTE — Progress Notes (Signed)
PT evaluation addendum Late entry for 05/19/16 G-codes    05/19/16 1200  PT Time Calculation  PT Start Time (ACUTE ONLY) 1250  PT Stop Time (ACUTE ONLY) 1312  PT Time Calculation (min) (ACUTE ONLY) 22 min  PT G-Codes **NOT FOR INPATIENT CLASS**  Functional Assessment Tool Used AM-PAC 6 Clicks Basic Mobility  Functional Limitation Mobility: Walking and moving around  Mobility: Walking and Moving Around Current Status (Z6109(G8978) CN  Mobility: Walking and Moving Around Goal Status (U0454(G8979) CJ  PT General Charges  $$ ACUTE PT VISIT 1 Procedure  PT Evaluation  $PT Eval Moderate Complexity 1 Procedure  07/02/2016 Corlis HoveMargie Ben Habermann, PT [phone]

## 2016-08-11 DEATH — deceased

## 2016-10-01 ENCOUNTER — Ambulatory Visit: Payer: Medicare Other | Admitting: Cardiovascular Disease

## 2018-04-03 IMAGING — CR DG CHEST 2V
2 series · 2 of 2 positions shown · non-contrast
Comparison: 06/25/2015 chest radiograph.

CLINICAL DATA: Shortness of breath

EXAM:
CHEST  2 VIEW

[chest lat]
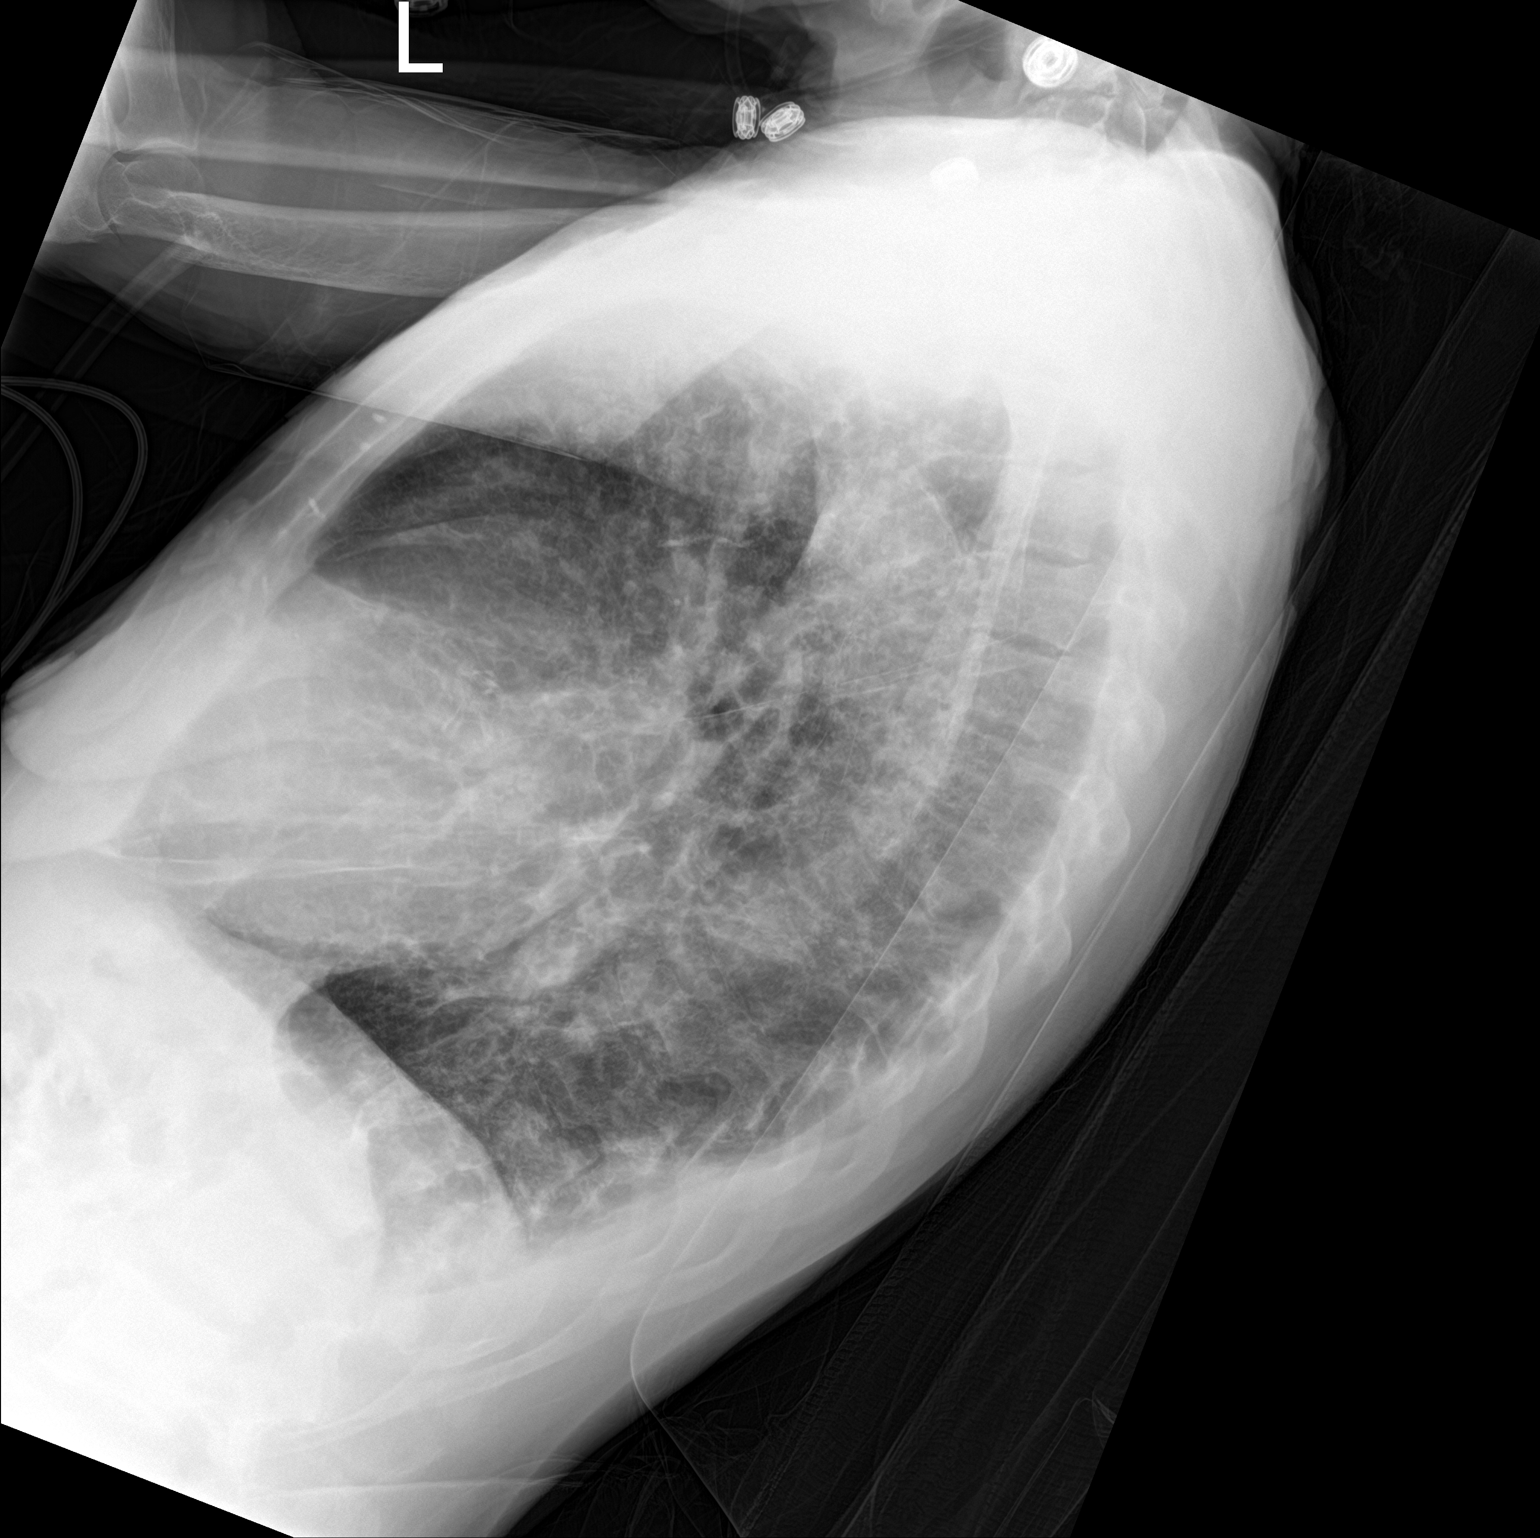

[chest ap]
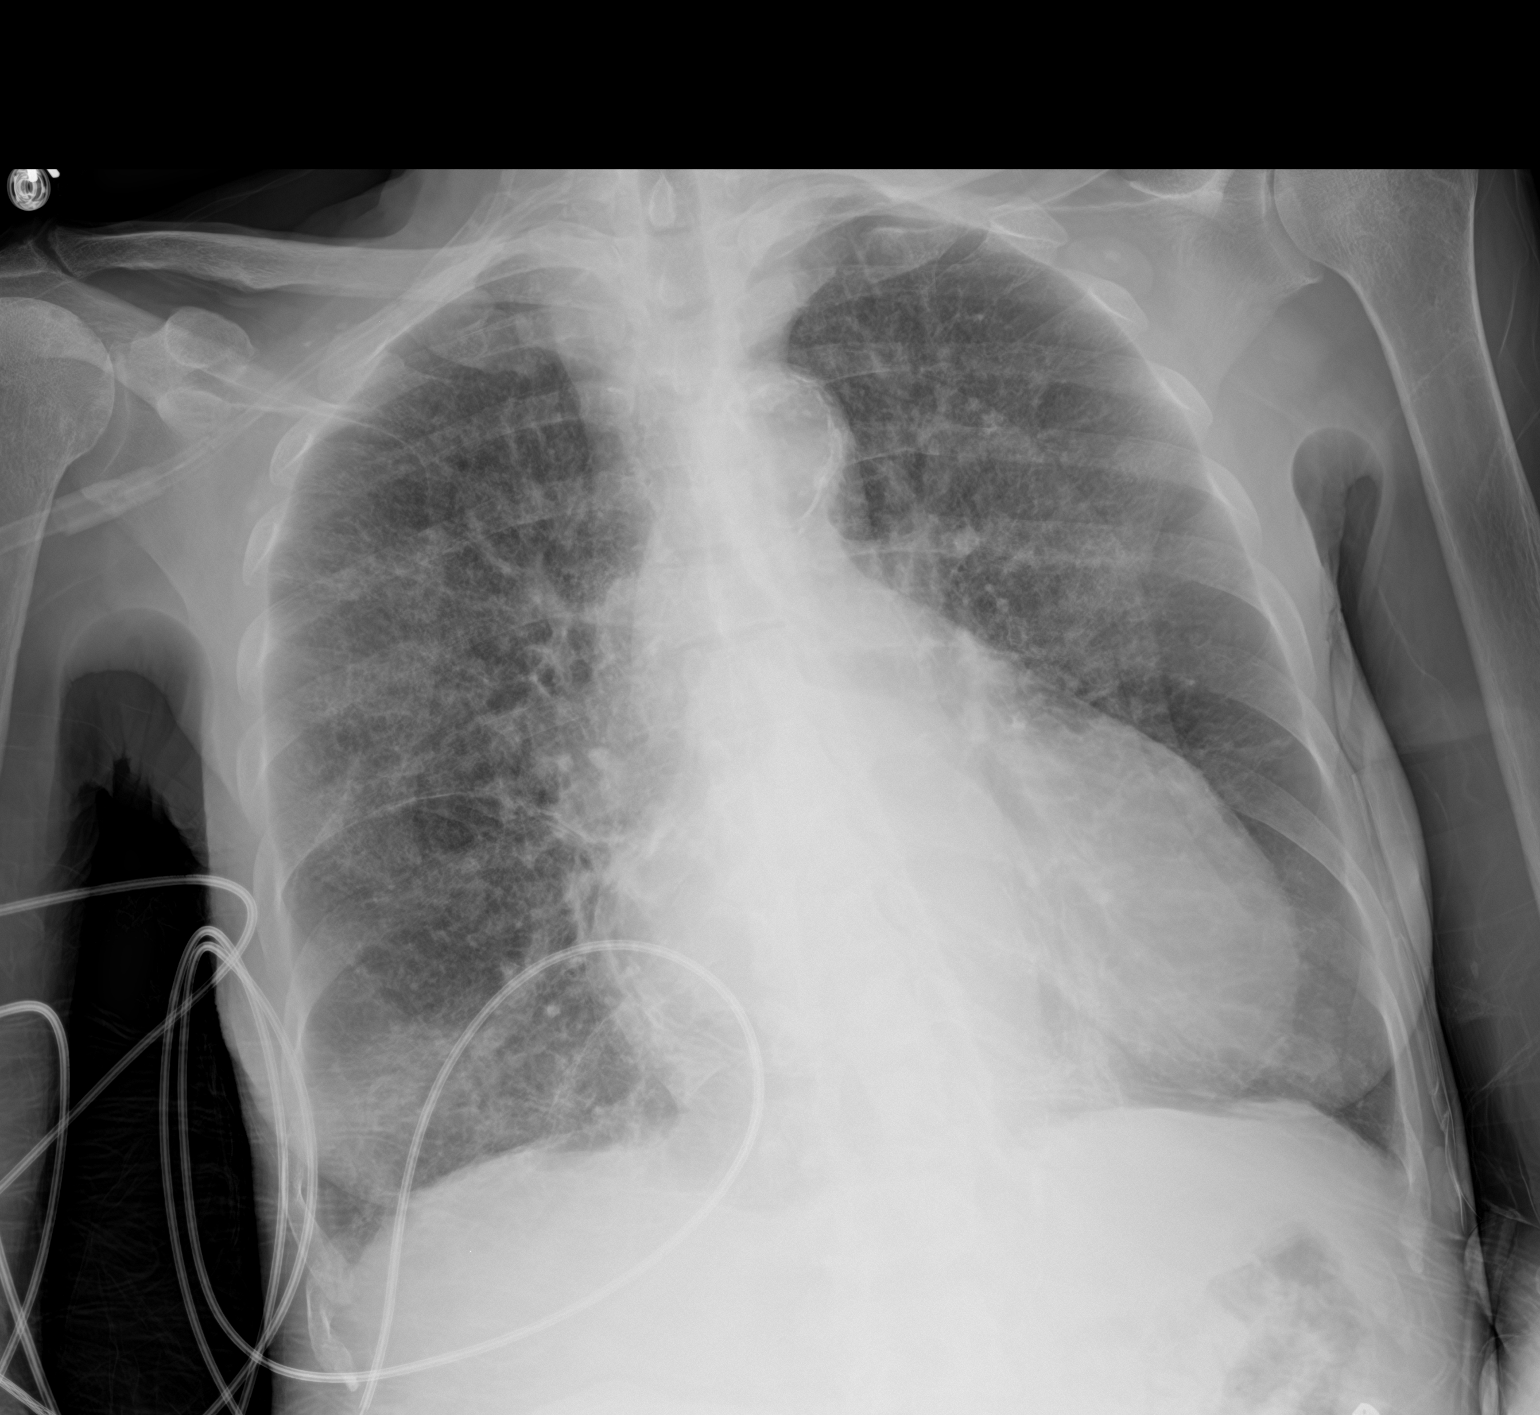

[2 of 2 positions shown; findings below may reference images not displayed]

FINDINGS: Stable cardiomediastinal silhouette with mild cardiomegaly. No
pneumothorax. No pleural effusion. Mild pulmonary edema.
IMPRESSION: Mild congestive heart failure.

## 2018-05-16 ENCOUNTER — Other Ambulatory Visit: Payer: Self-pay | Admitting: *Deleted

## 2018-06-28 IMAGING — CR DG CHEST 2V
2 series · 2 of 2 positions shown · non-contrast
Comparison: 09/28/2015

CLINICAL DATA: Shortness of breath, COPD

EXAM:
CHEST  2 VIEW

[chest lat]
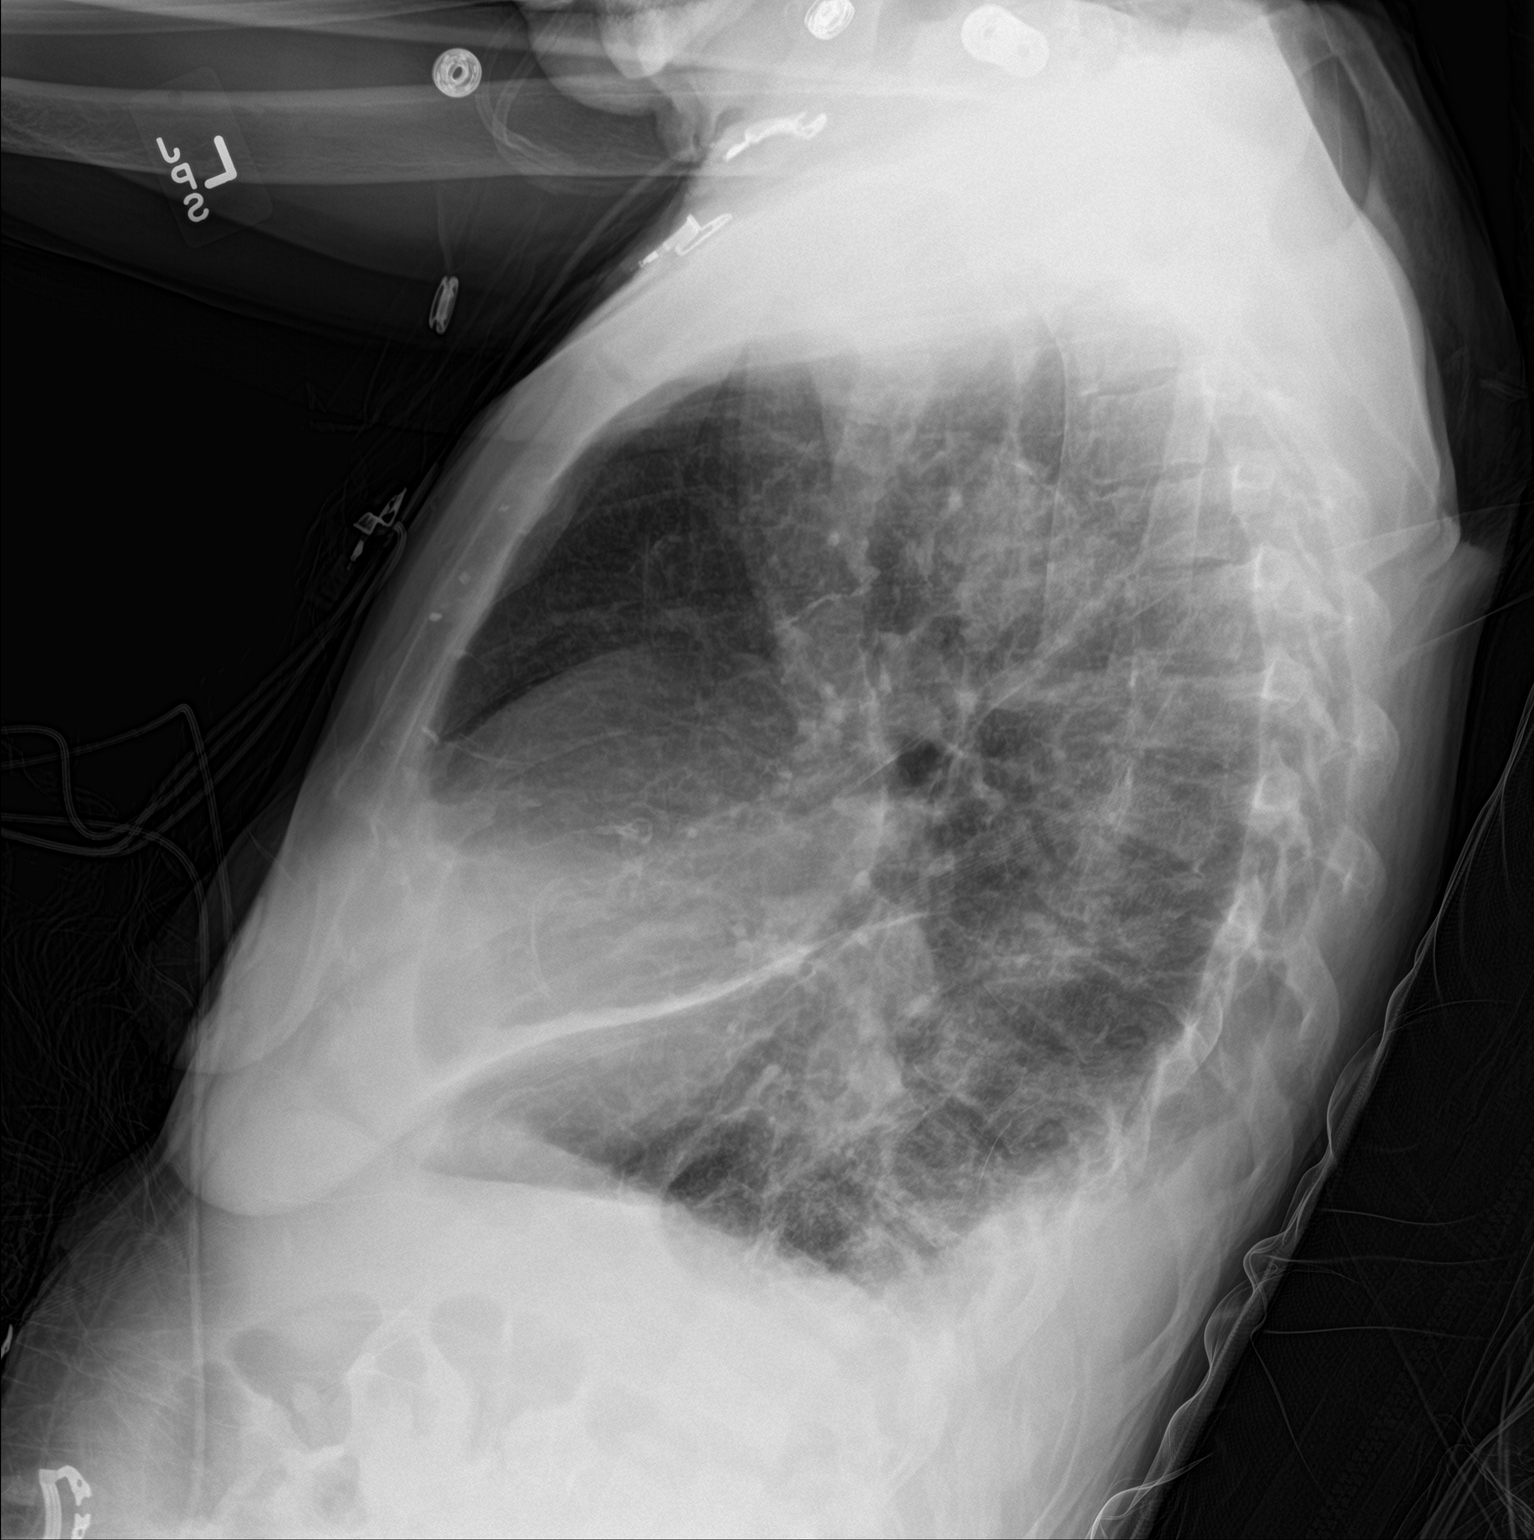

[chest ap]
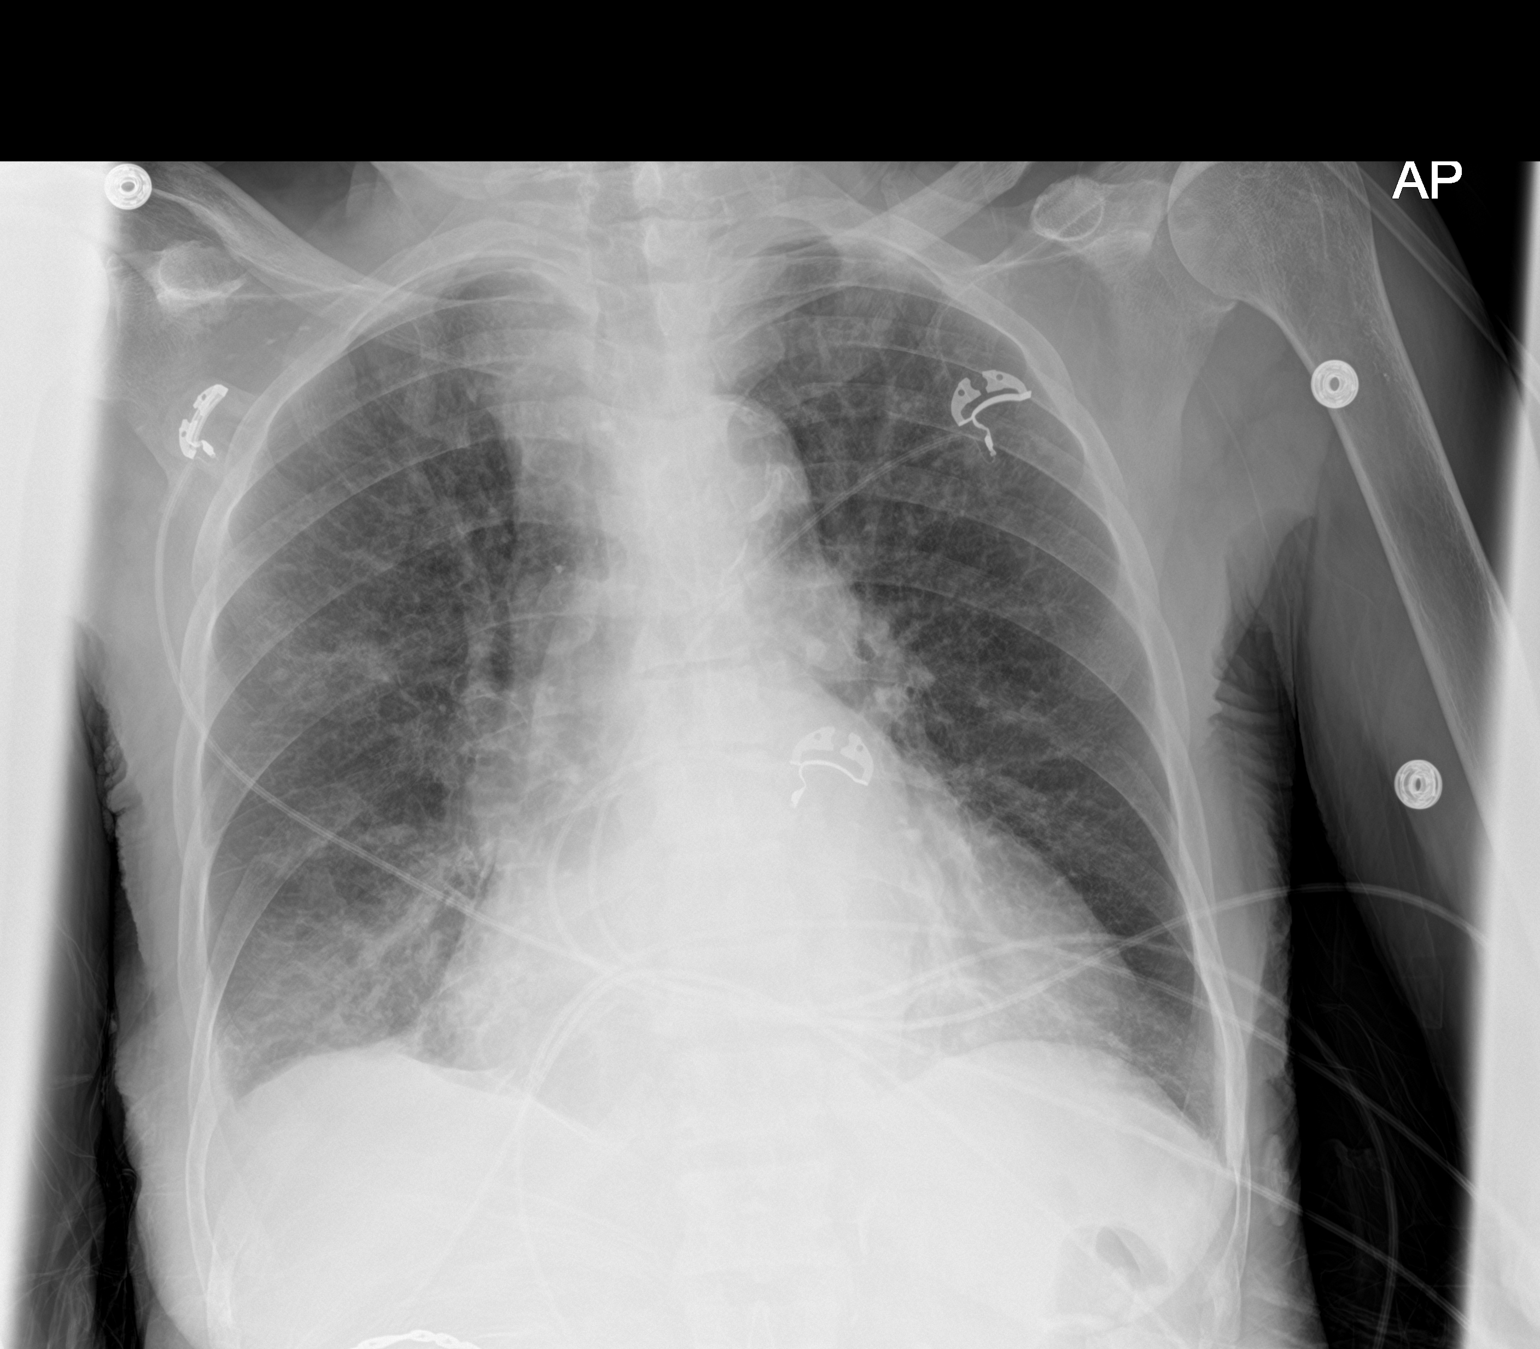

[2 of 2 positions shown; findings below may reference images not displayed]

FINDINGS: Cardiomegaly. Patchy right lower lobe and right upper lobe
opacities, slightly increased since prior study concerning for
pneumonia. Minimal left basilar opacity, likely atelectasis. Small
right pleural effusion. No acute bony abnormality.
IMPRESSION: Increasing right lower lobe and right upper lobe patchy opacities
concerning for pneumonia. Small right effusion.

Left base atelectasis.

Stable cardiomegaly

## 2019-02-15 IMAGING — DX DG CHEST 1V PORT
1 series · 1 of 1 positions shown · non-contrast
Comparison: 11/07/2015

CLINICAL DATA: Tired, shortness of breath, and leg swelling for 2
weeks. History of hypertension, COPD, CHF.

EXAM:
PORTABLE CHEST 1 VIEW

[chest ap]
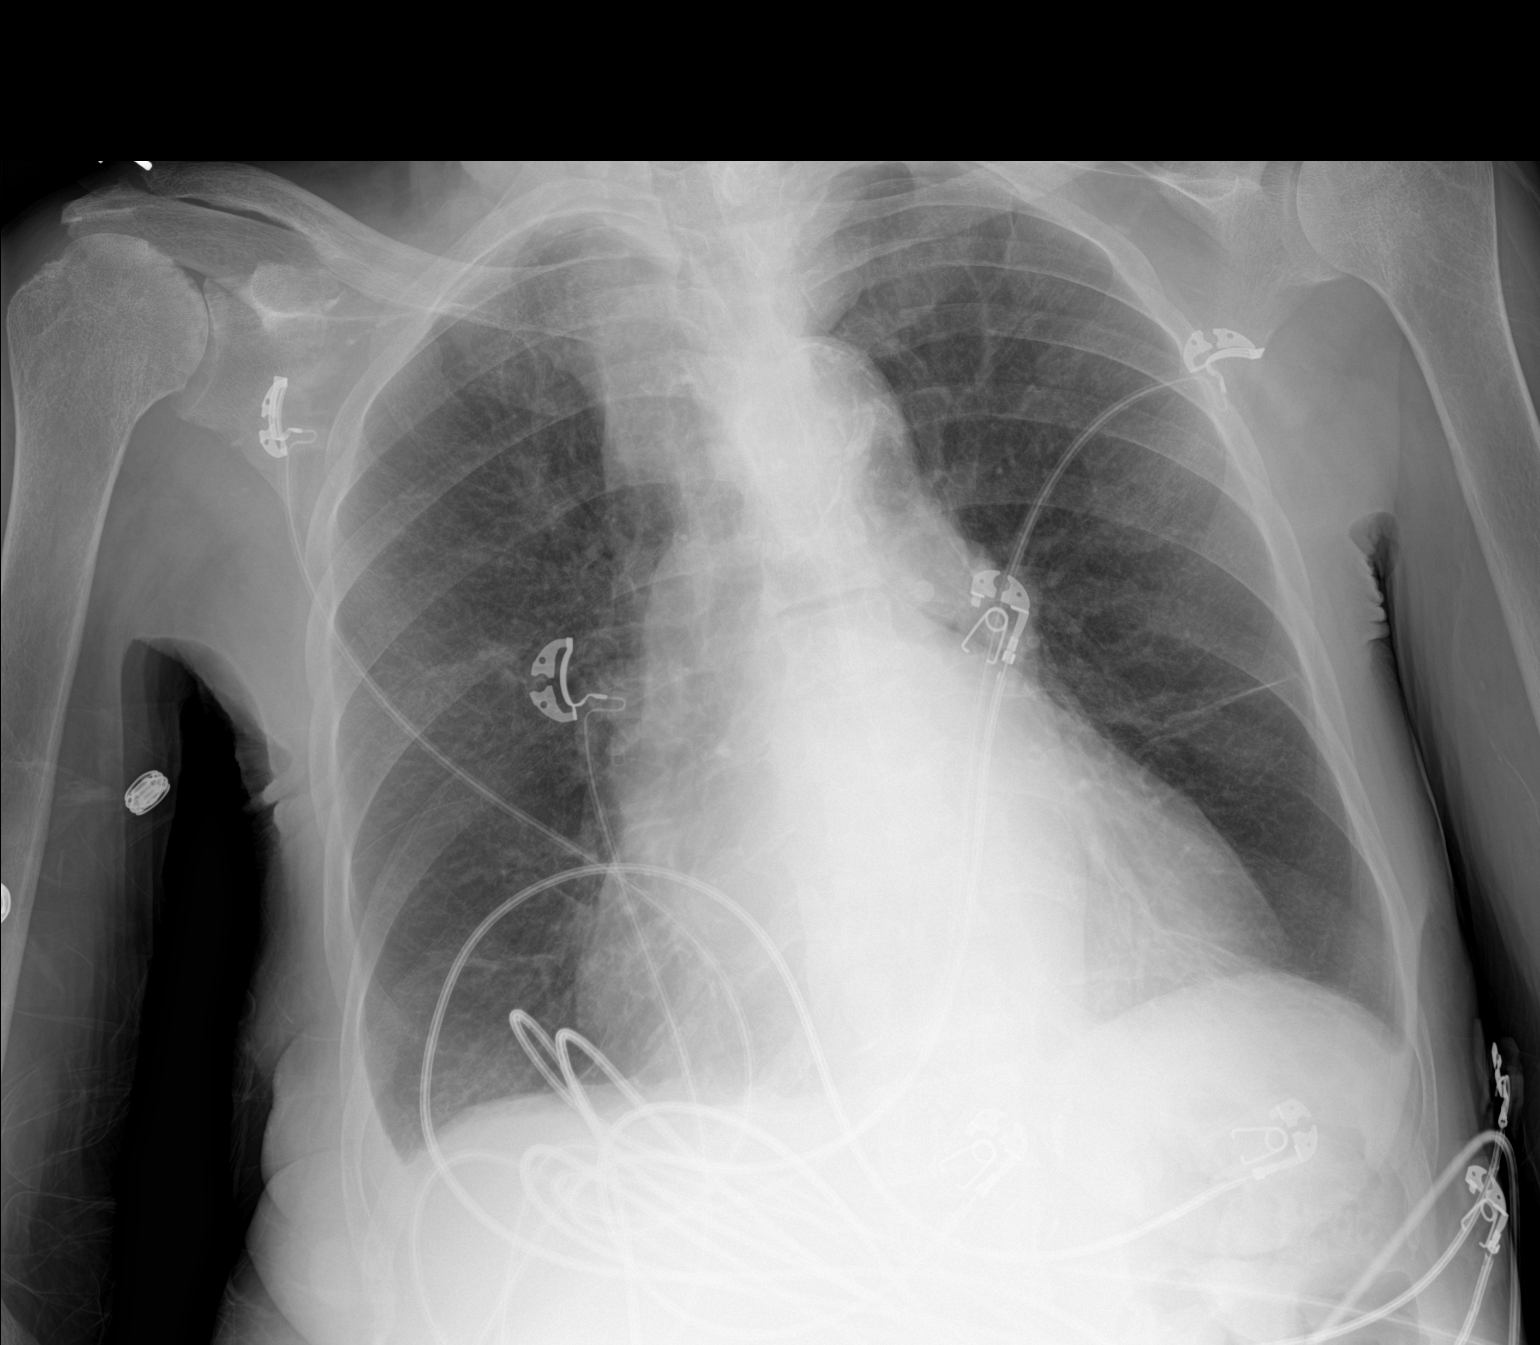

[1 of 1 positions shown; findings below may reference images not displayed]

FINDINGS: Cardiac enlargement. No pulmonary vascular congestion or edema. No
focal consolidation. Blunting of the costophrenic angles suggesting
small pleural effusions. No pneumothorax. Calcified and tortuous
aorta. Degenerative changes in the shoulders.
IMPRESSION: Cardiac enlargement. Small pleural effusions. No focal consolidation
or edema.
